# Patient Record
Sex: Female | Born: 1935 | Race: White | Hispanic: No | Smoking: Never smoker
Health system: Southern US, Community
[De-identification: ages and names within clinical notes are randomized; demographics above are authoritative.]

## PROBLEM LIST (undated history)

## (undated) ENCOUNTER — Ambulatory Visit: Admission: RE | Payer: Medicare Other | Source: Ambulatory Visit | Admitting: Gastroenterology

## (undated) DIAGNOSIS — J9601 Acute respiratory failure with hypoxia: Secondary | ICD-10-CM

## (undated) DIAGNOSIS — I2510 Atherosclerotic heart disease of native coronary artery without angina pectoris: Secondary | ICD-10-CM

## (undated) DIAGNOSIS — I10 Essential (primary) hypertension: Secondary | ICD-10-CM

## (undated) DIAGNOSIS — I499 Cardiac arrhythmia, unspecified: Secondary | ICD-10-CM

## (undated) DIAGNOSIS — I509 Heart failure, unspecified: Secondary | ICD-10-CM

## (undated) DIAGNOSIS — I639 Cerebral infarction, unspecified: Secondary | ICD-10-CM

## (undated) DIAGNOSIS — C50919 Malignant neoplasm of unspecified site of unspecified female breast: Secondary | ICD-10-CM

## (undated) HISTORY — DX: Cardiac arrhythmia, unspecified: I49.9

## (undated) HISTORY — PX: MASTECTOMY: SHX3

## (undated) HISTORY — DX: Atherosclerotic heart disease of native coronary artery without angina pectoris: I25.10

## (undated) HISTORY — DX: Heart failure, unspecified: I50.9

## (undated) HISTORY — PX: ABDOMINAL HYSTERECTOMY: SHX81

## (undated) HISTORY — PX: CHOLECYSTECTOMY: SHX55

## (undated) HISTORY — PX: APPENDECTOMY: SHX54

## (undated) HISTORY — PX: BREAST IMPLANT EXCHANGE: SHX6296

---

## 2004-06-30 ENCOUNTER — Other Ambulatory Visit: Payer: Self-pay

## 2004-06-30 ENCOUNTER — Inpatient Hospital Stay: Payer: Self-pay | Admitting: Surgery

## 2004-06-30 ENCOUNTER — Ambulatory Visit: Payer: Self-pay | Admitting: Internal Medicine

## 2004-06-30 IMAGING — US ABDOMEN ULTRASOUND
1 series · 17 of 25 positions shown · non-contrast
Comparison: none

REASON FOR EXAM: RUQ pain. Call report [[email or web address]], extension [IE]
COMMENTS:

[Series 1: abdomen ultrasound · 17 of 69 slices shown]
[im 1/69]
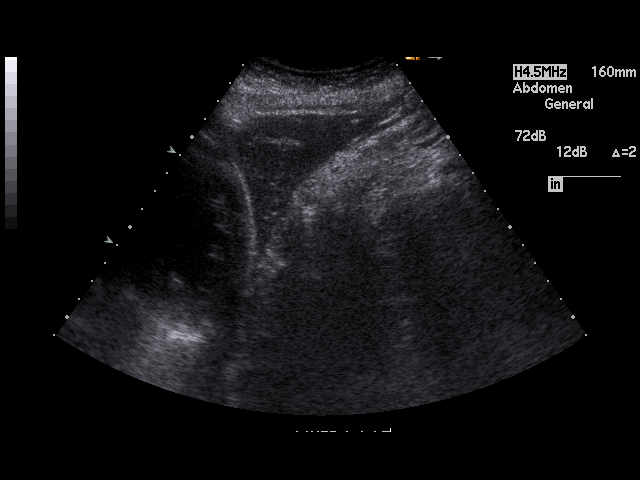
[im 6/69]
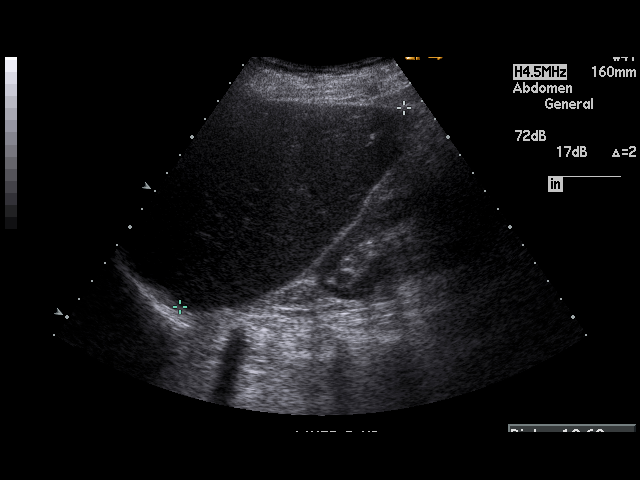
[im 9/69]
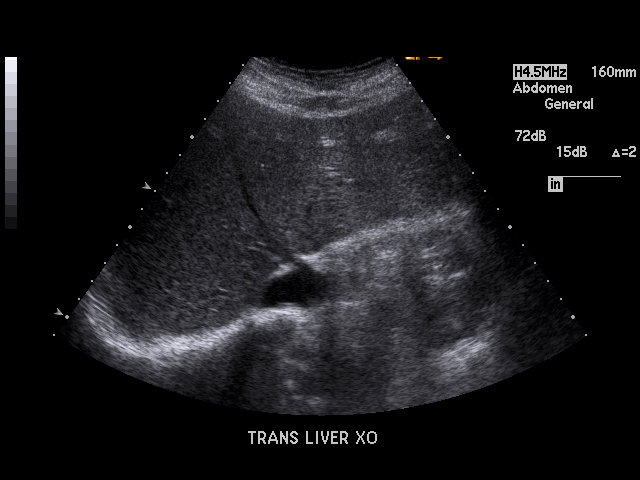
[im 15/69]
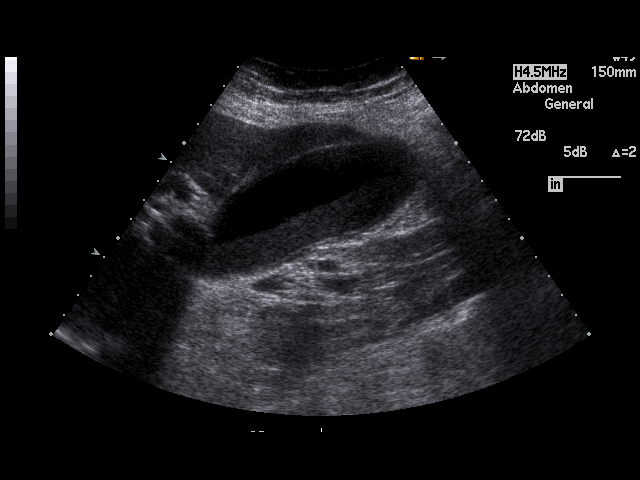
[im 18/69]
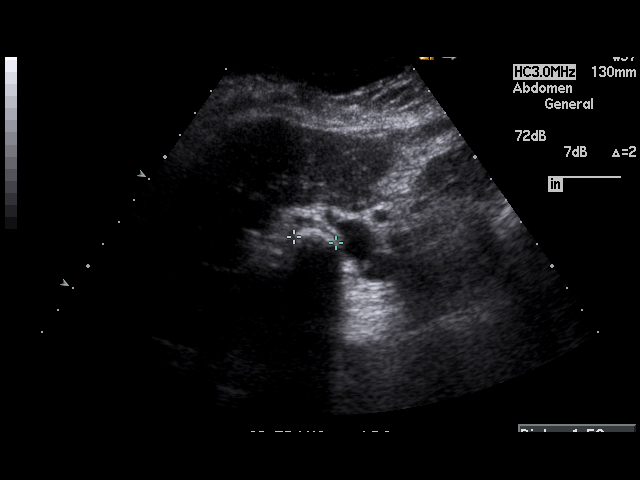
[im 23/69]
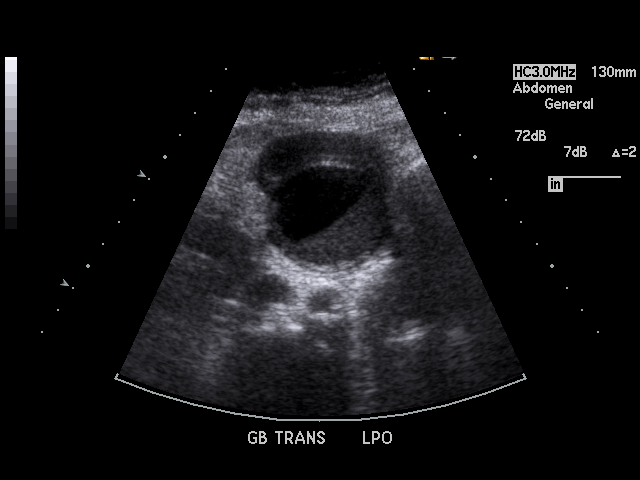
[im 26/69]
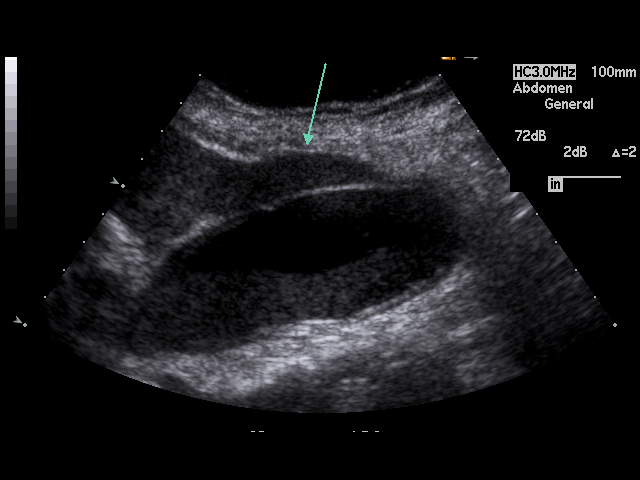
[im 32/69]
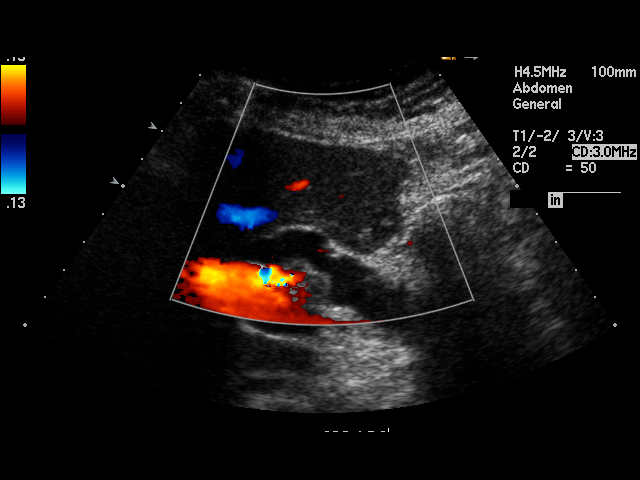
[im 35/69]
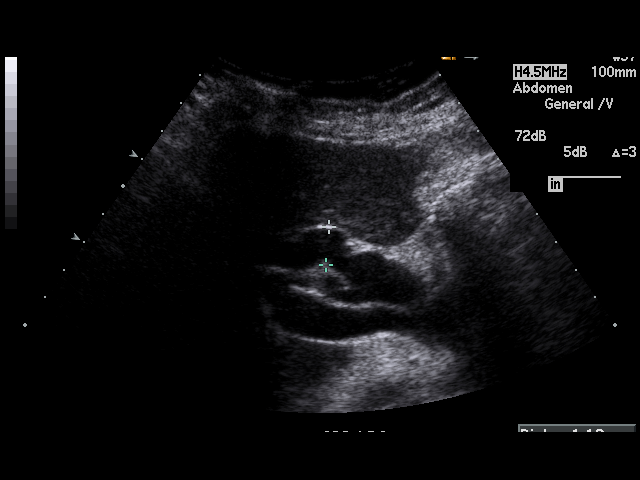
[im 37/69]
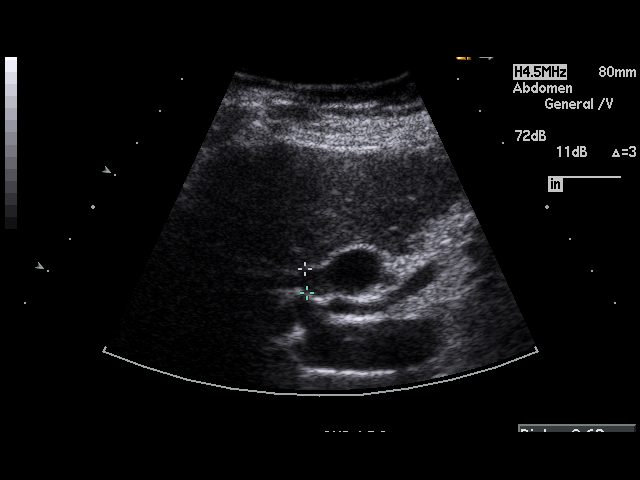
[im 43/69]
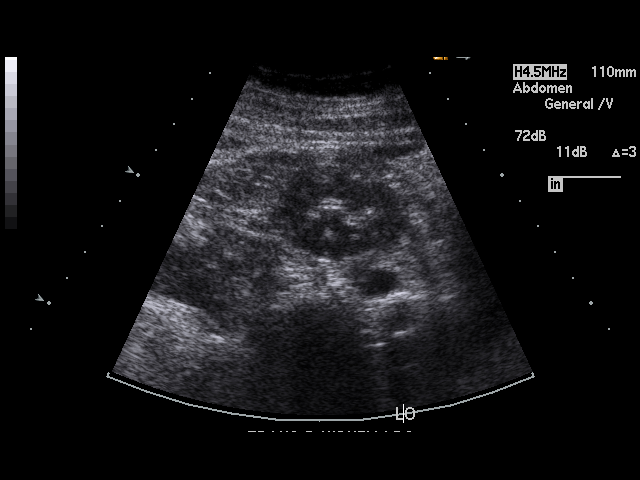
[im 46/69]
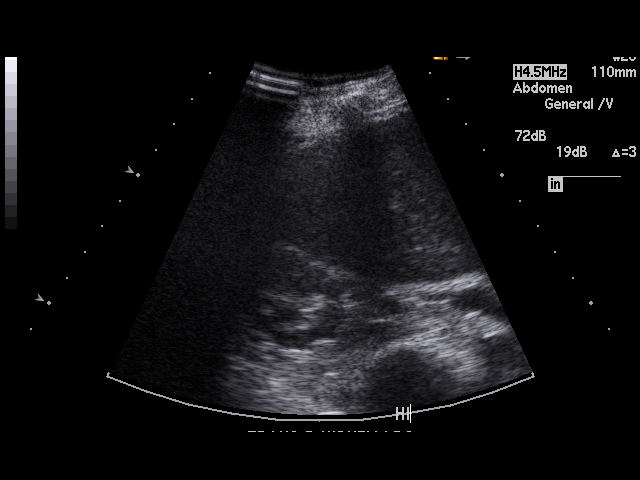
[im 52/69]
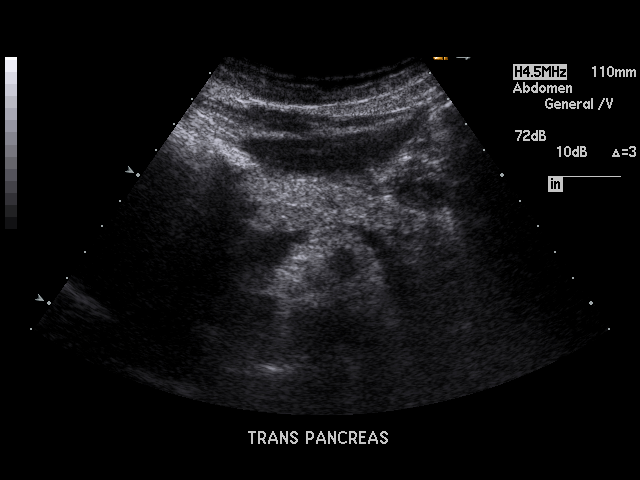
[im 54/69]
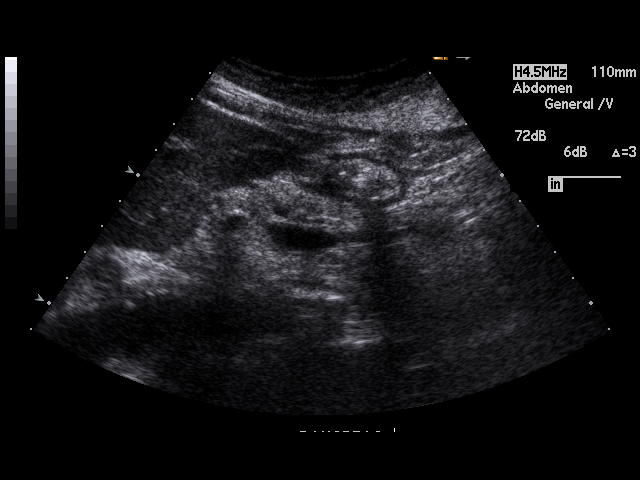
[im 60/69]
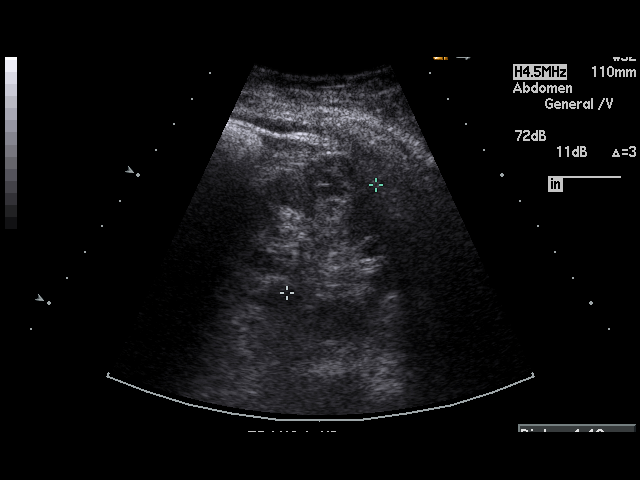
[im 63/69]
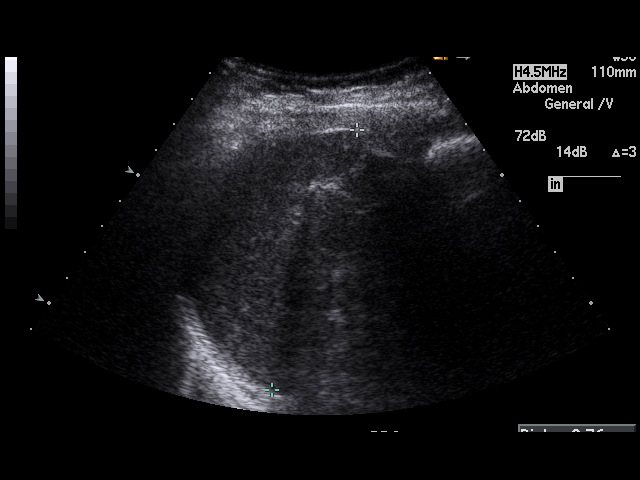
[im 69/69]
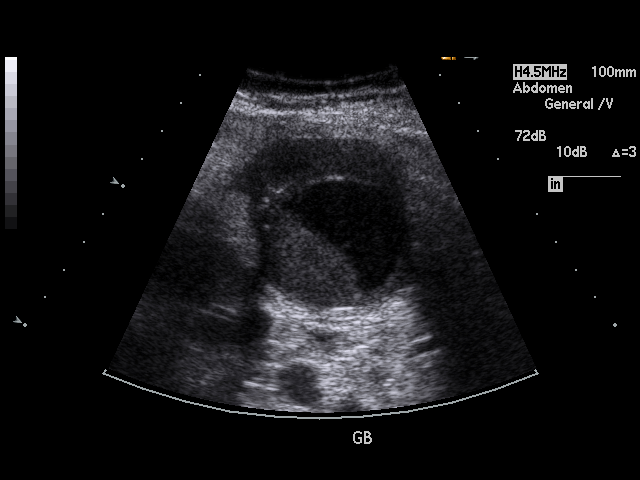

[17 of 25 positions shown; findings below may reference images not displayed]

PROCEDURE:     US  - US ABDOMEN GENERAL SURVEY  - [DATE]  [DATE]

RESULT:     The liver and spleen show no definite abnormalities.  The
pancreas is normal in appearance.  The gallbladder appears large.  There is
echogenic material in the gallbladder compatible with sludge.  Additionally
there is noted a stone in the neck of the gallbladder which does not move as
the patient changes position and is apparently lodged in the gallbladder
neck.  No definite thickening of the gallbladder wall is seen at this time.
The common bile duct is enlarged and is noted to measure 1.37 cm at maximum
diameter.  This degree of dilatation suggests common duct obstruction
although the obstructing agent is not identified on the study.  The kidneys
show no hydronephrosis.  There is no ascites.
IMPRESSION: Cholelithiasis. There is observed a stone which appears to be lodged in the
gallbladder neck.

There is dilatation of the common duct suspicious for partial common duct
obstruction.

No definite thickening of the gallbladder wall is identified at this time
but thickening is sometimes not well seen in the distended gallbladder.

The pancreas is normal in appearance.

## 2004-07-06 ENCOUNTER — Other Ambulatory Visit: Payer: Self-pay

## 2005-04-17 ENCOUNTER — Emergency Department: Payer: Self-pay | Admitting: Emergency Medicine

## 2005-04-17 IMAGING — CR RIGHT ANKLE - COMPLETE 3+ VIEW
1 series · 5 of 5 positions shown · non-contrast
Comparison: none

REASON FOR EXAM: Pain
COMMENTS:  LMP: N/A

PROCEDURE:     DXR - DXR ANKLE RIGHT COMPLETE  - [DATE] [DATE]
RESULT:          Views of the ankle reveal the joint mortise to be
preserved.  The talar dome is intact.  There is a plantar calcaneal spur.  I
see no acute fracture of the ankle nor of the visualized portions of the
foot.

[Series 1: view not recorded · 0.17mm/px · 5 of 5 slices shown]
[im 1/5]
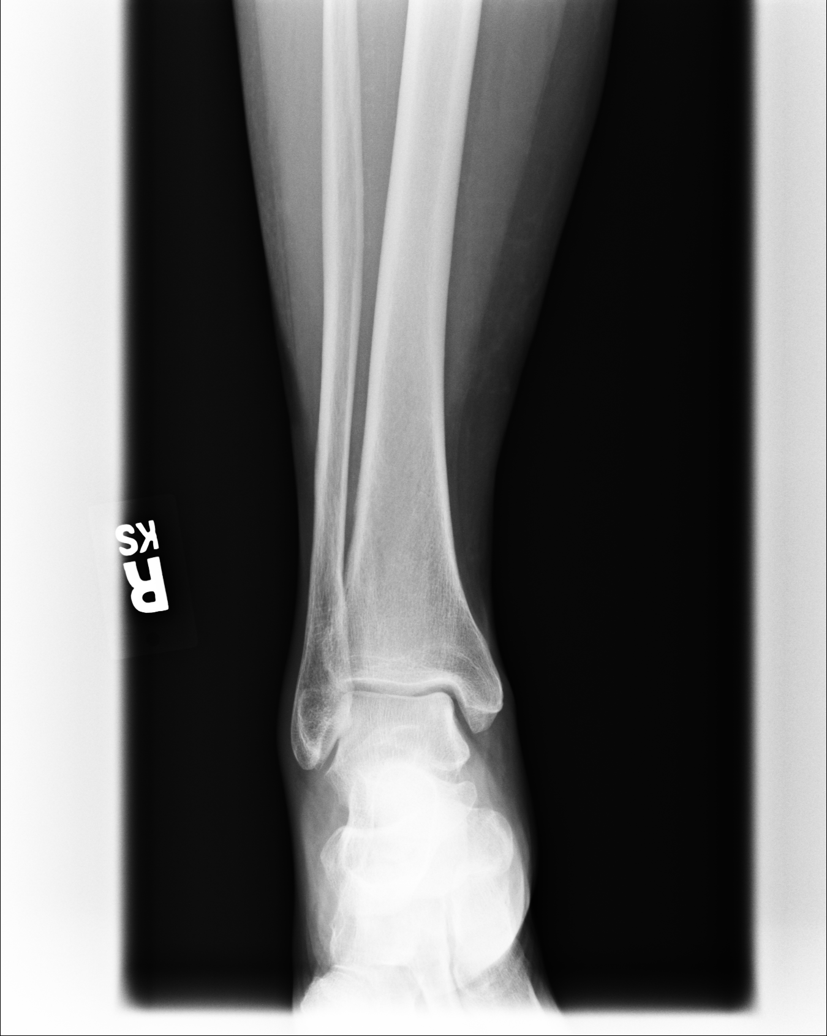
[im 2/5]
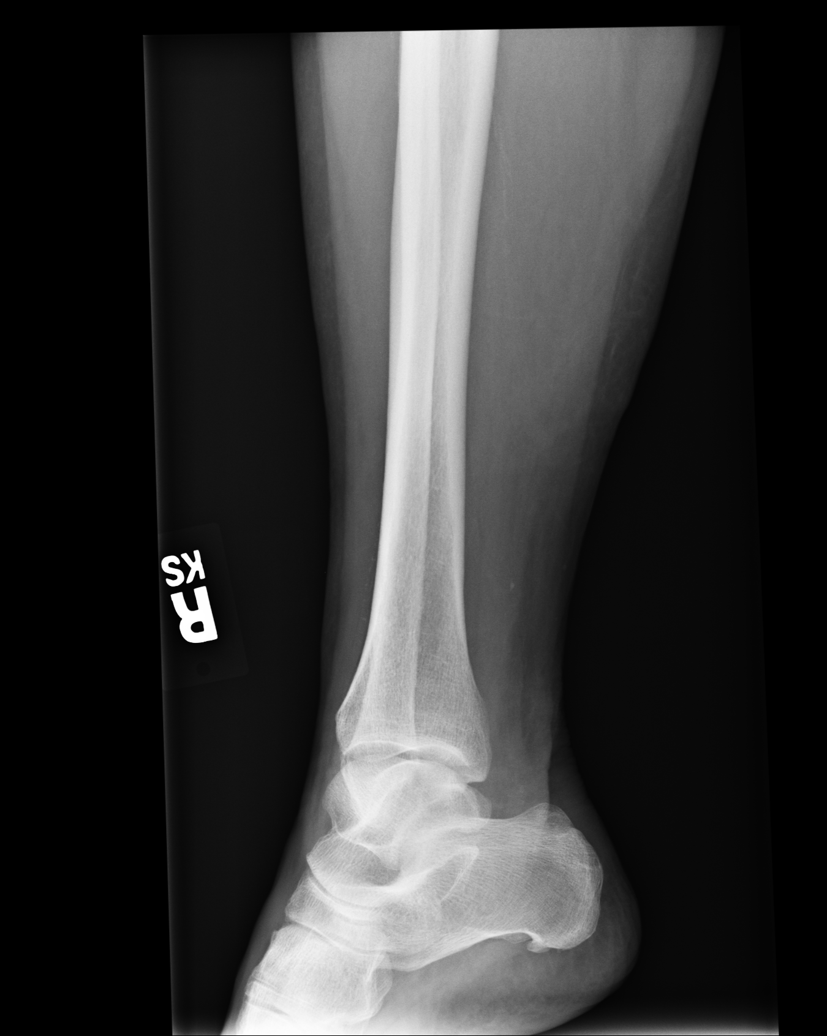
[im 3/5]
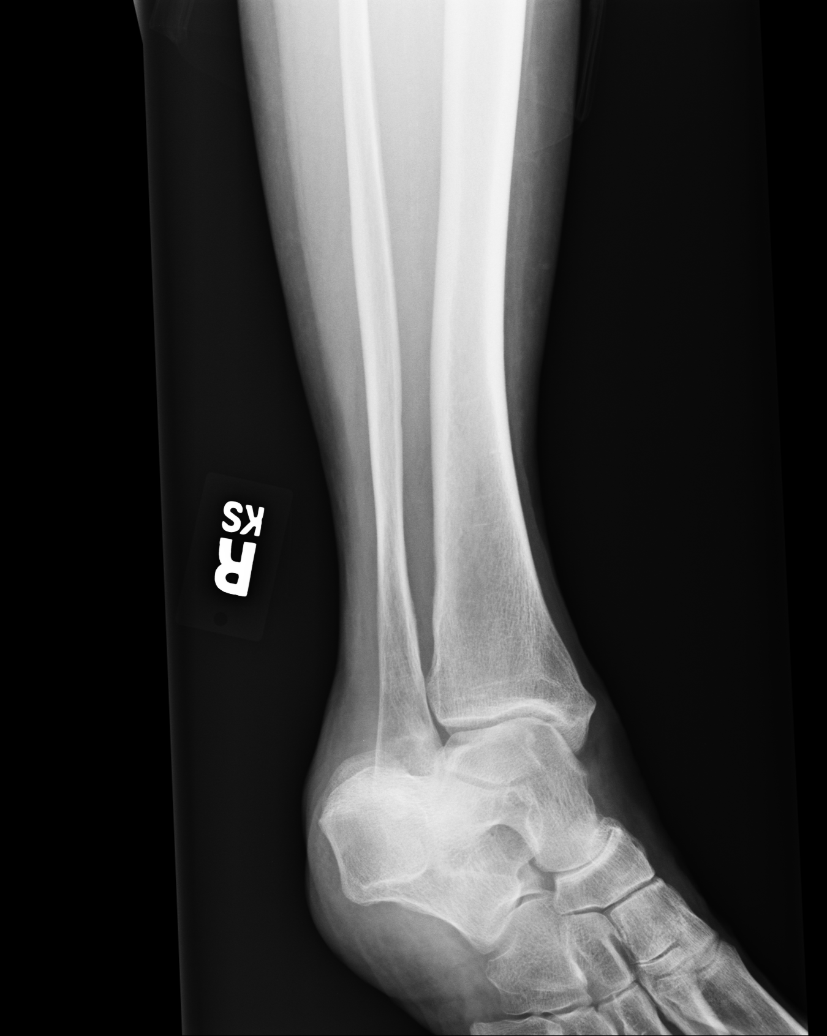
[im 4/5]
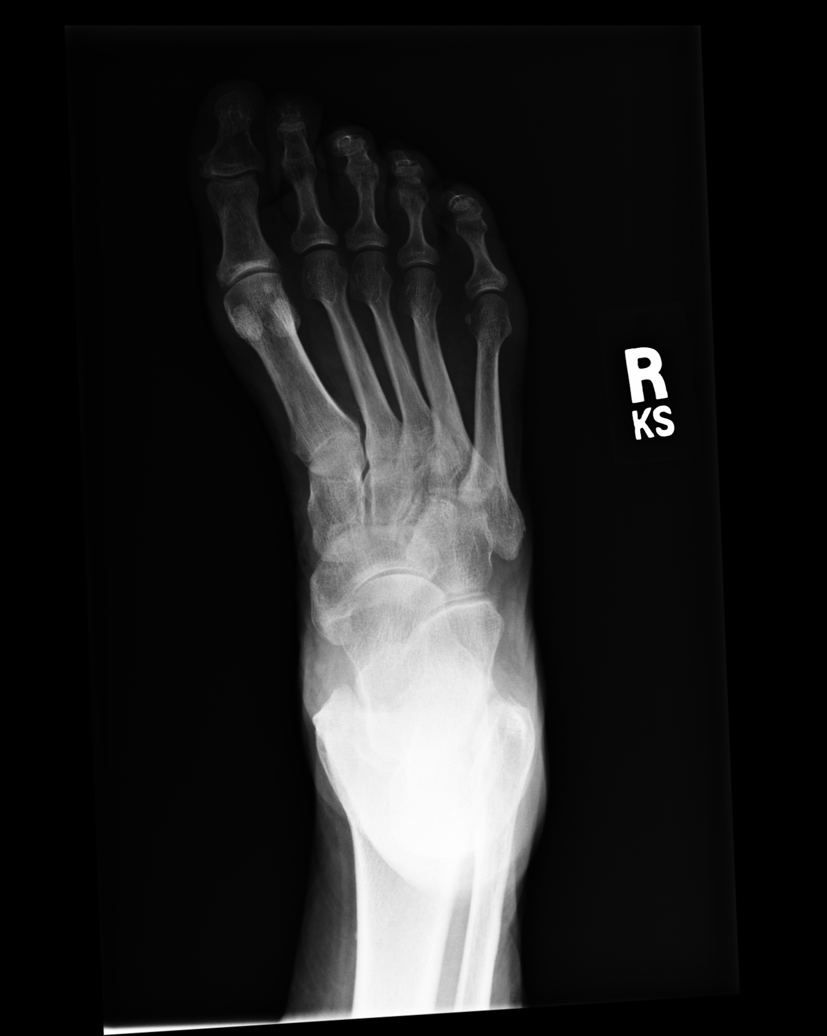
[im 5/5]
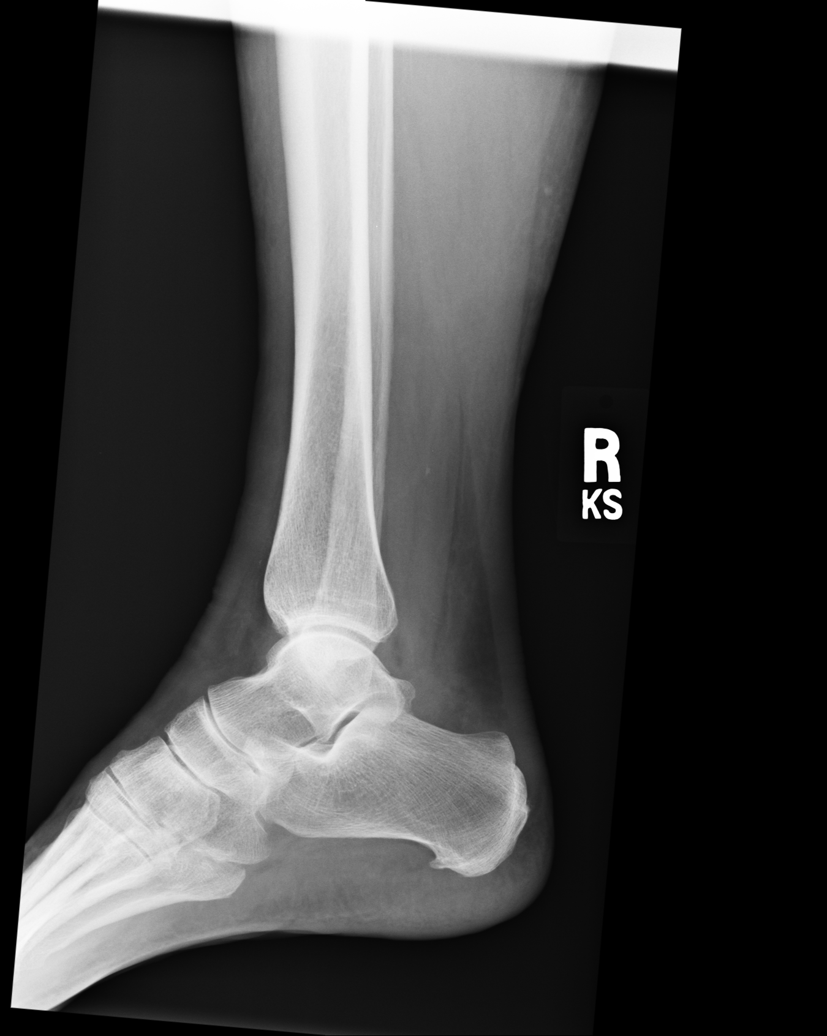

[5 of 5 positions shown; findings below may reference images not displayed]

IMPRESSION: I see no acute bony abnormality of the RIGHT ankle.

## 2007-03-08 ENCOUNTER — Ambulatory Visit: Payer: Self-pay | Admitting: Gastroenterology

## 2014-07-25 IMAGING — US US ABDOMEN LIMITED
1 series · 14 of 25 positions shown · non-contrast
Comparison: [DATE].

CLINICAL DATA: Elevated liver function tests.

EXAM:
US ABDOMEN LIMITED - RIGHT UPPER QUADRANT

[Series 1: us abdomen limited · 0.19mm/px · 14 of 48 slices shown]
[im 1/48]
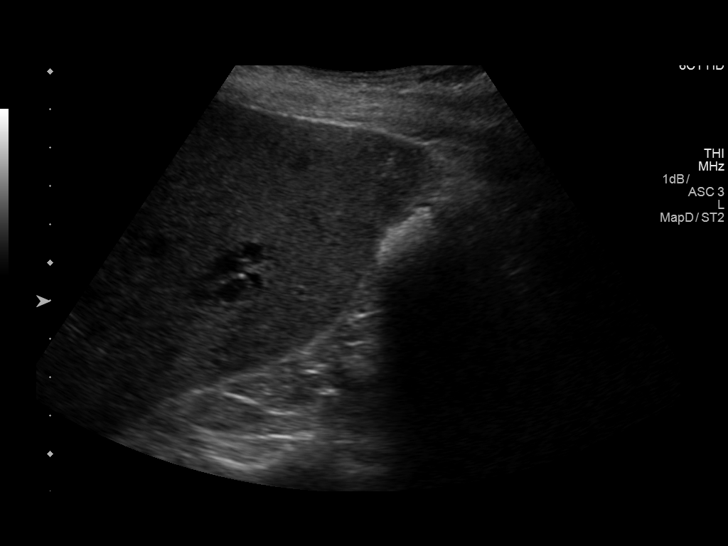
[im 4/48]
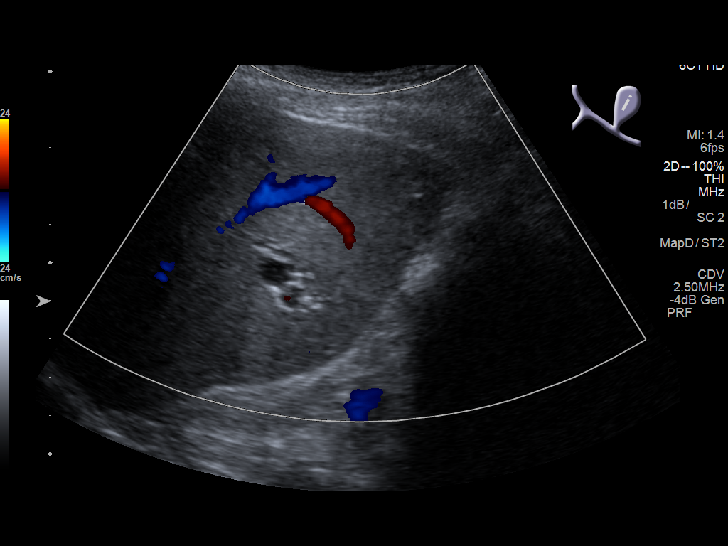
[im 8/48]
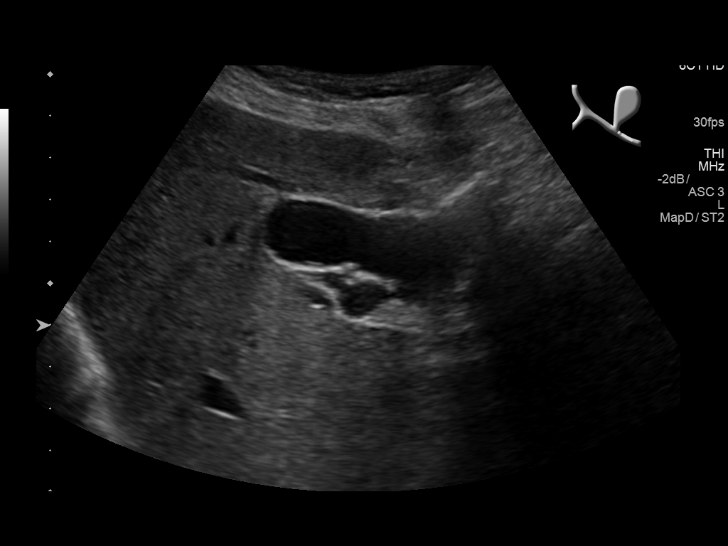
[im 12/48]
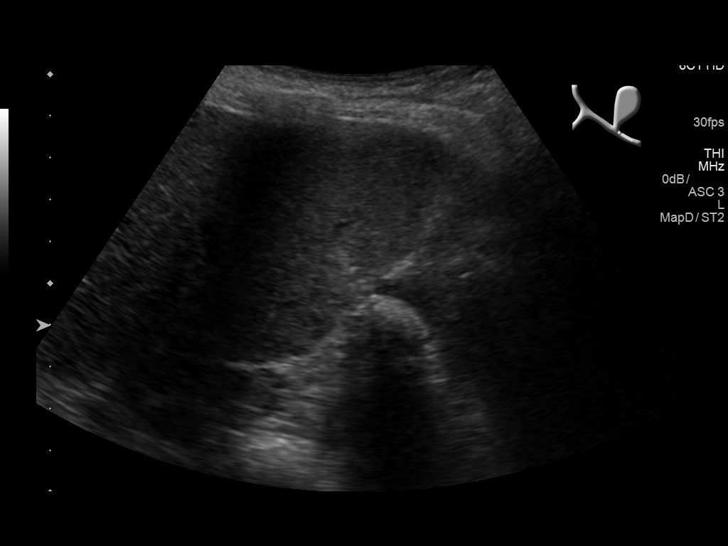
[im 16/48]
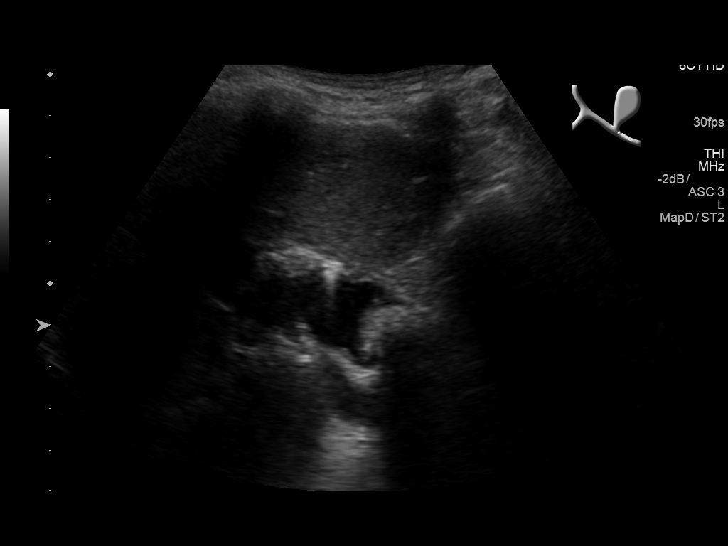
[im 18/48]
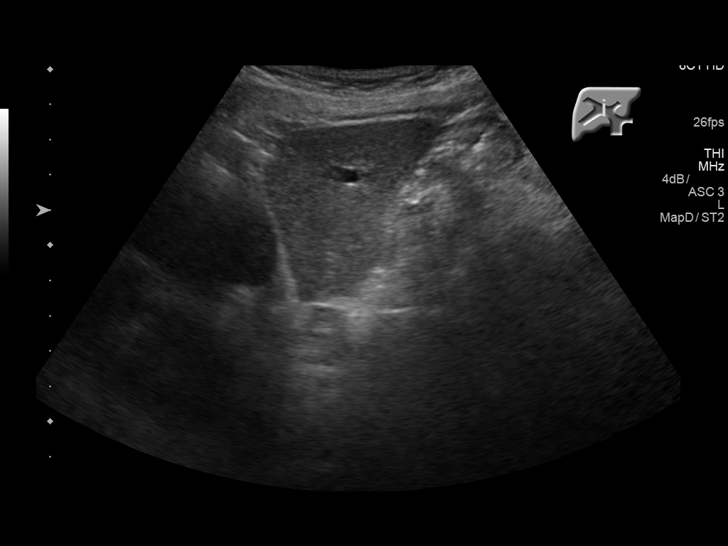
[im 22/48]
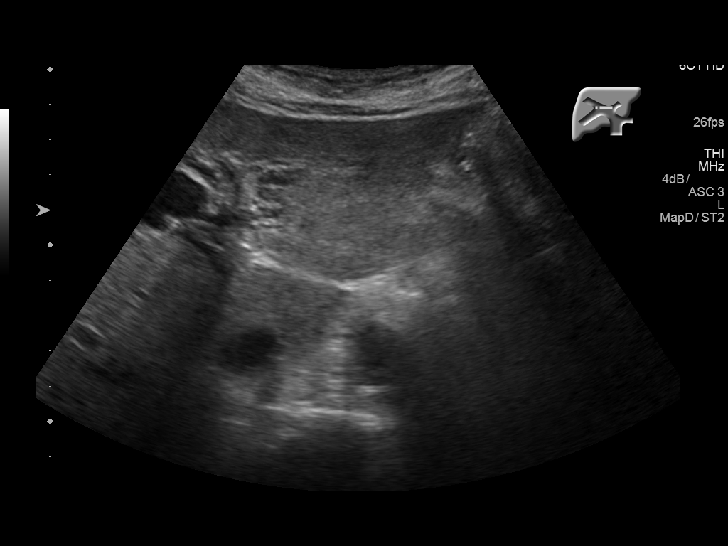
[im 26/48]
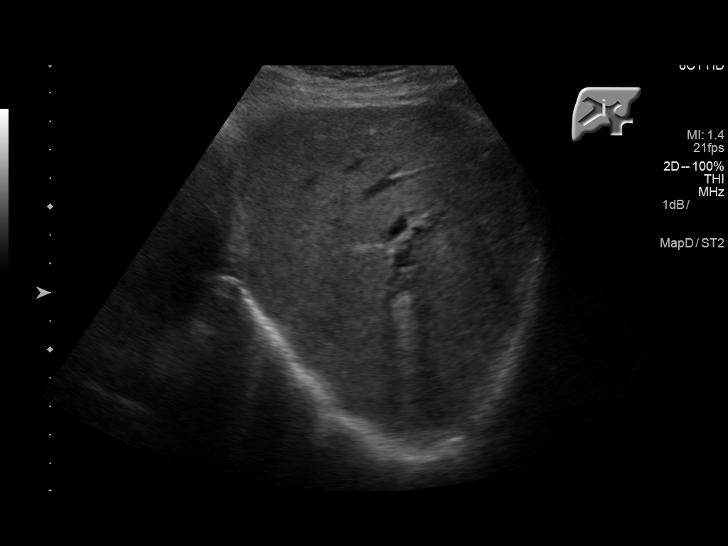
[im 30/48]
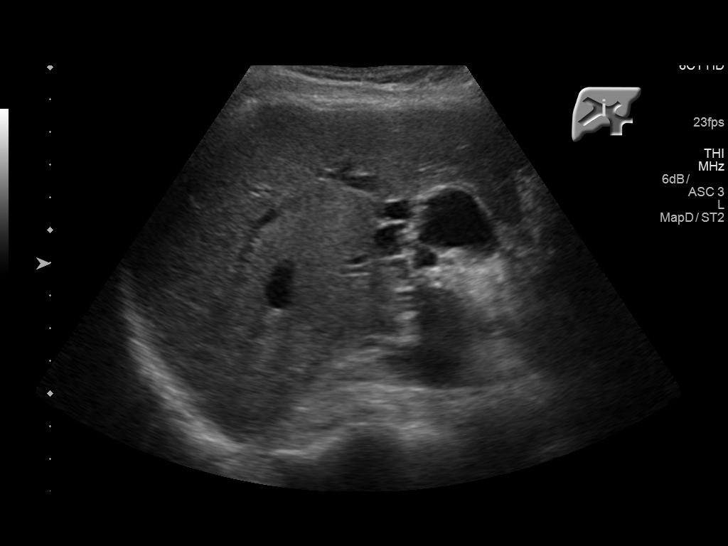
[im 32/48]
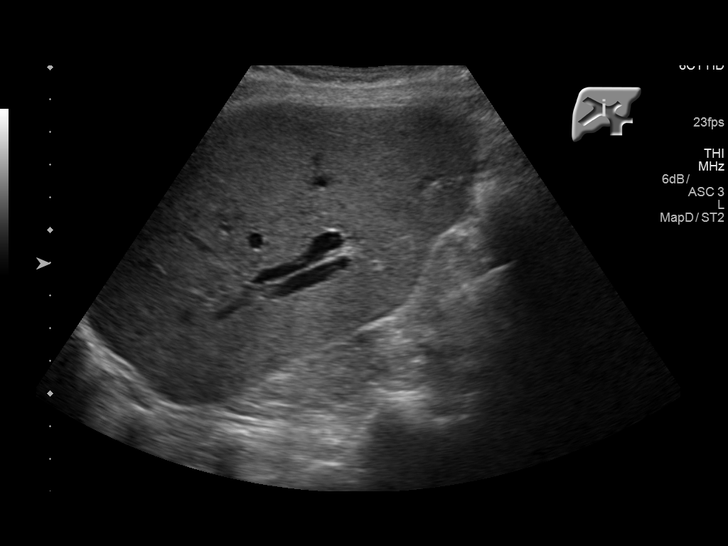
[im 36/48]
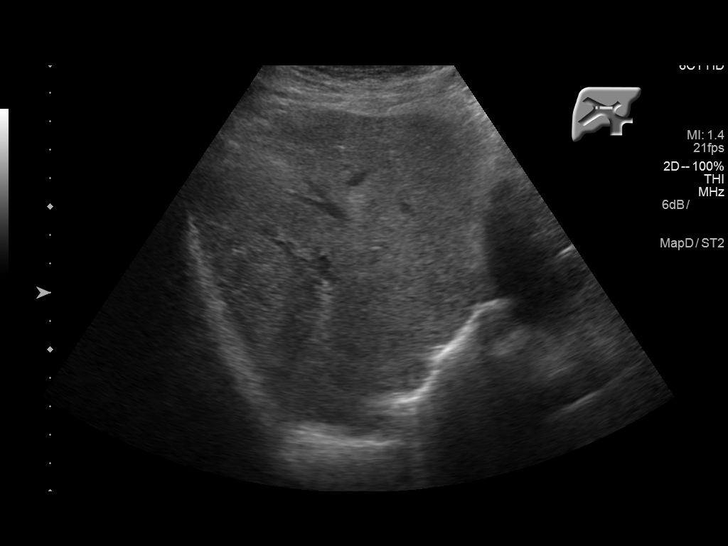
[im 40/48]
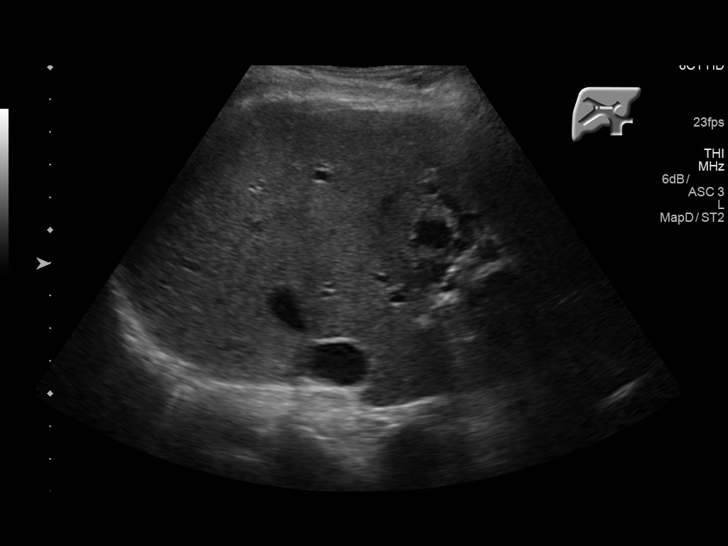
[im 44/48]
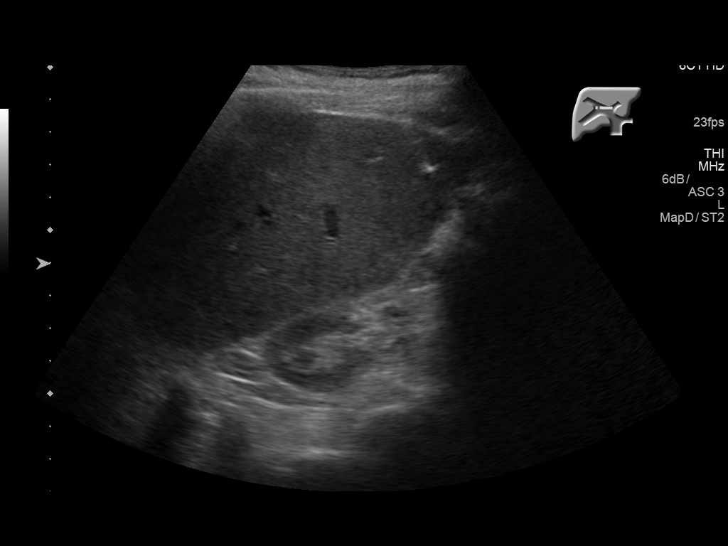
[im 48/48]
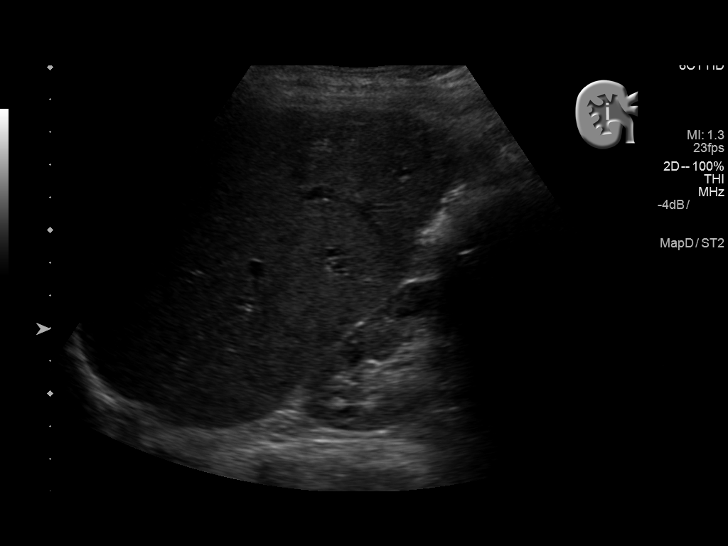

[14 of 25 positions shown; findings below may reference images not displayed]

FINDINGS: Gallbladder:

Status post cholecystectomy.

Common bile duct:

Diameter: Large probable gallstone measuring 1.7 cm is seen in the
common bile duct. Common bile duct is severely dilated at 2 cm.
Dilated intrahepatic ducts are noted as well.

Liver:

No focal lesion identified. Within normal limits in parenchymal
echogenicity.
IMPRESSION: Status post cholecystectomy. Severely dilated common bile duct
secondary to stone in distal common bile duct consistent with
choledocholithiasis. Further evaluation with CT scan is recommended.

## 2014-11-15 IMAGING — US US ABDOMEN LIMITED
1 series · 14 of 25 positions shown · non-contrast
Comparison: [DATE] abdominal sonogram.

CLINICAL DATA: [AGE] female with history of cholecystectomy
and dilated common bile duct with choledocholithiasis.

EXAM:
US ABDOMEN LIMITED - RIGHT UPPER QUADRANT

[Series 1: us abdomen limited · 0.19mm/px · 14 of 56 slices shown]
[im 1/56]
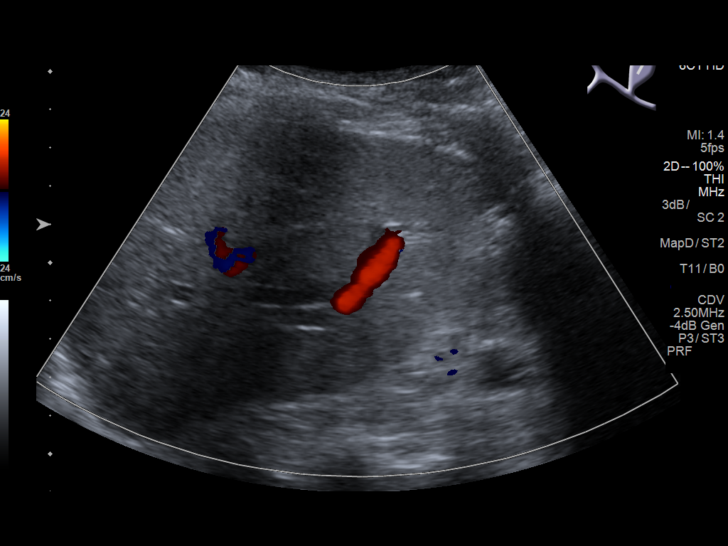
[im 5/56]
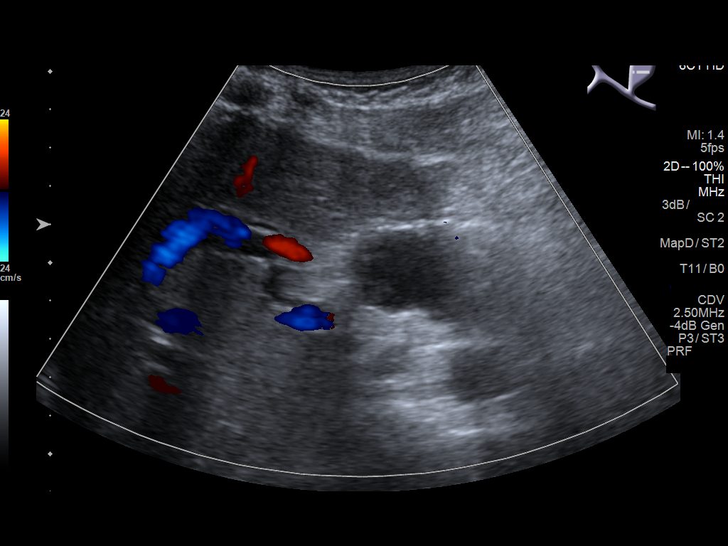
[im 10/56]
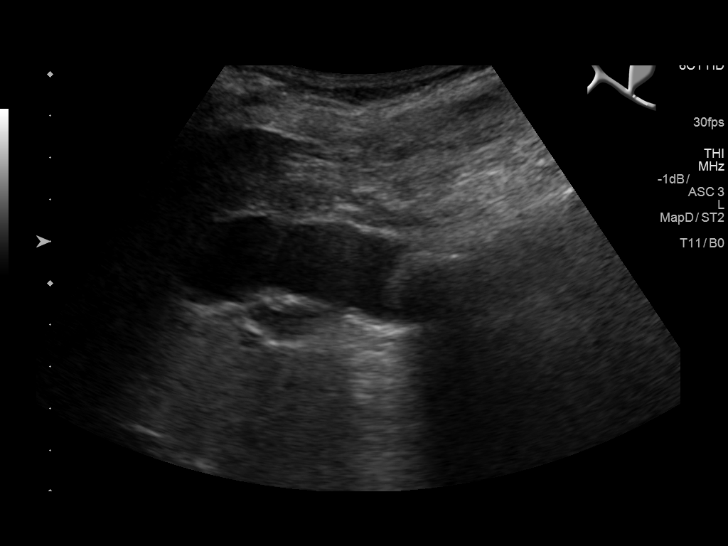
[im 14/56]
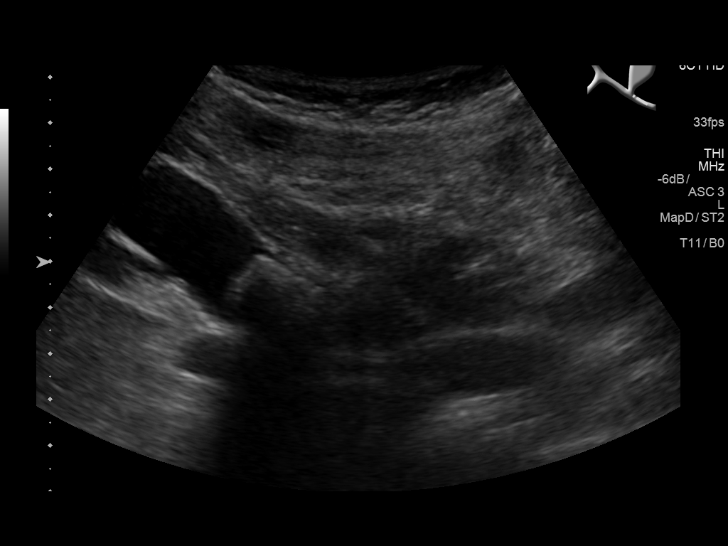
[im 19/56]
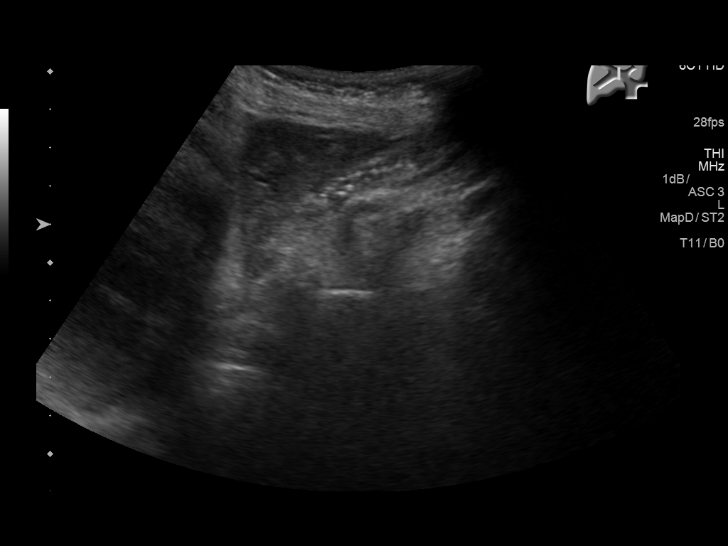
[im 21/56]
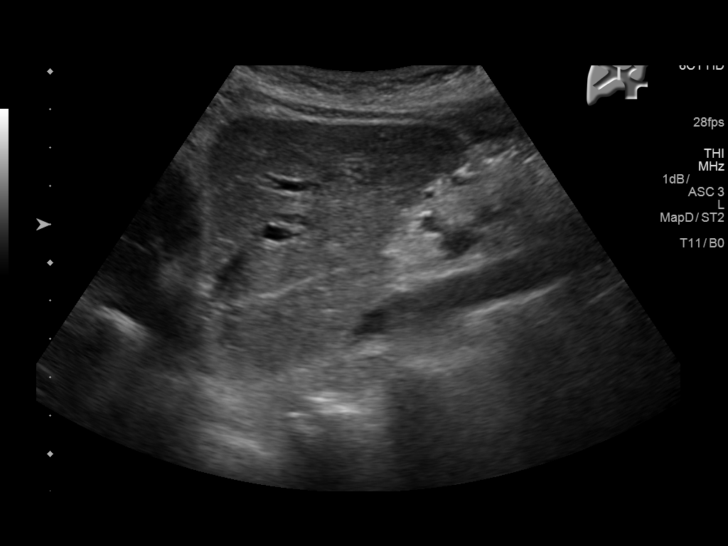
[im 26/56]
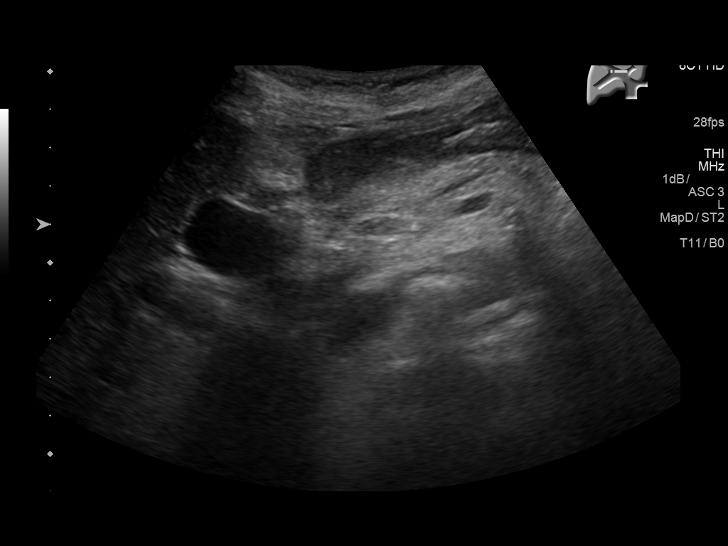
[im 30/56]
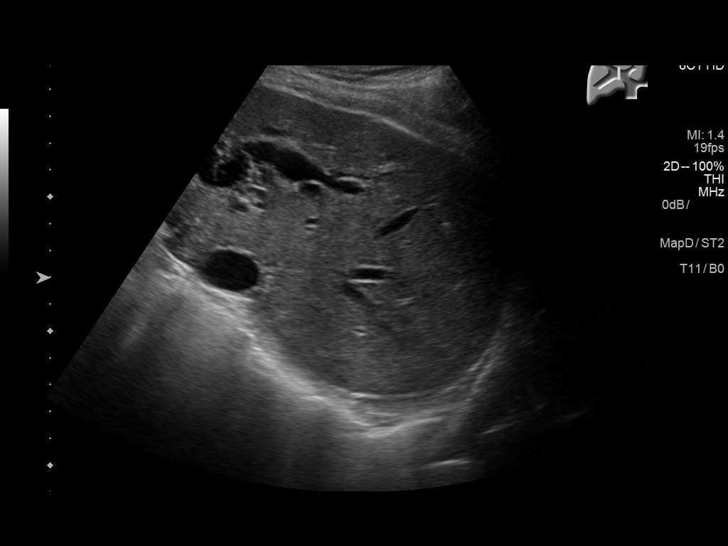
[im 35/56]
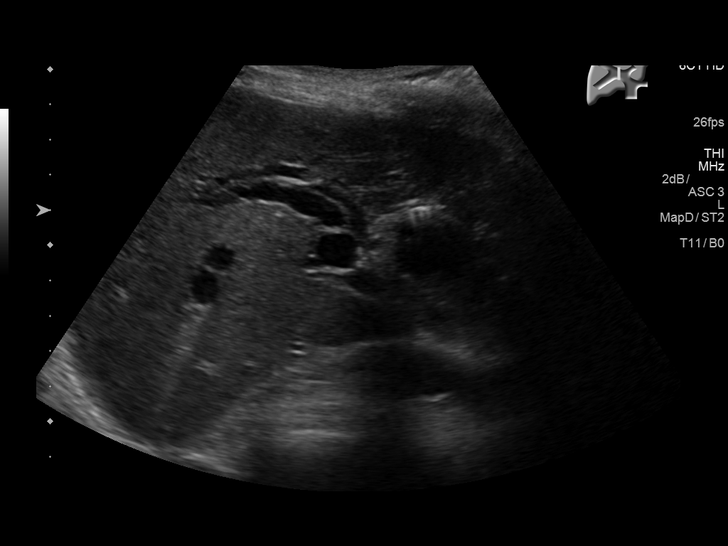
[im 37/56]
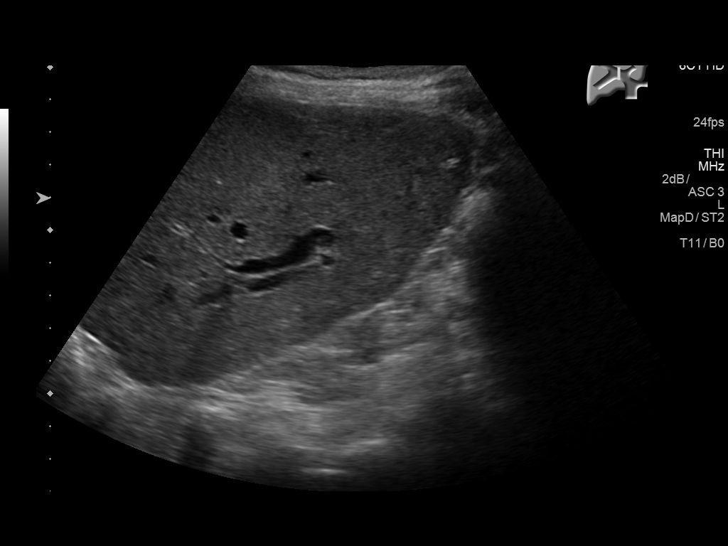
[im 42/56]
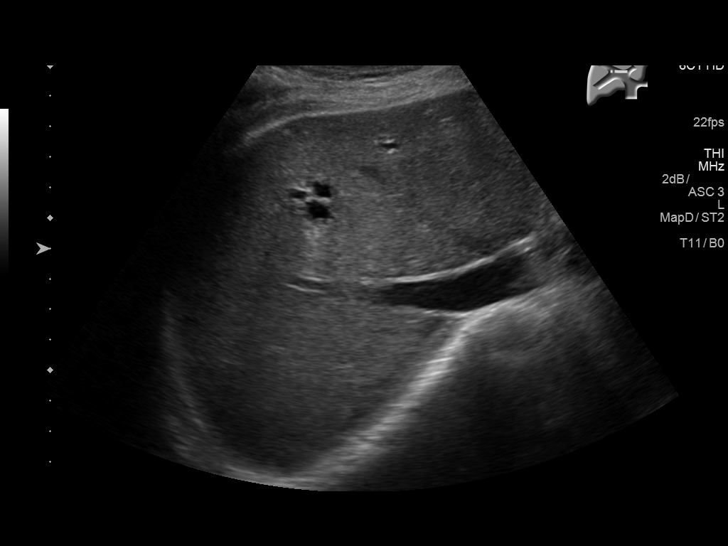
[im 46/56]
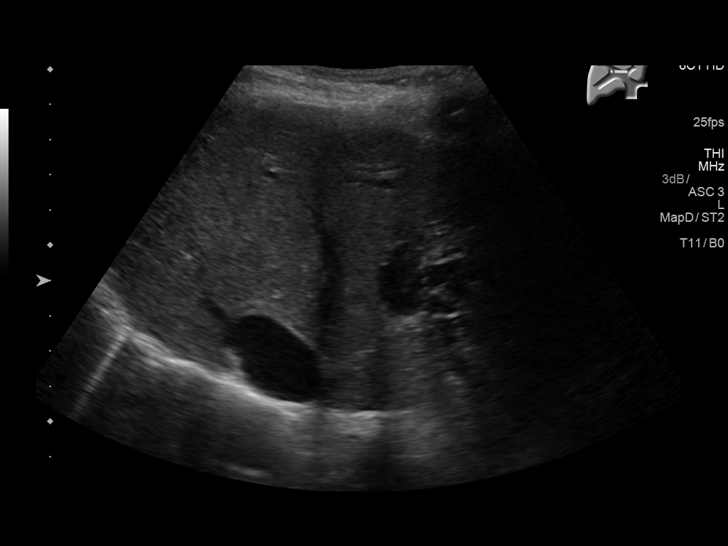
[im 51/56]
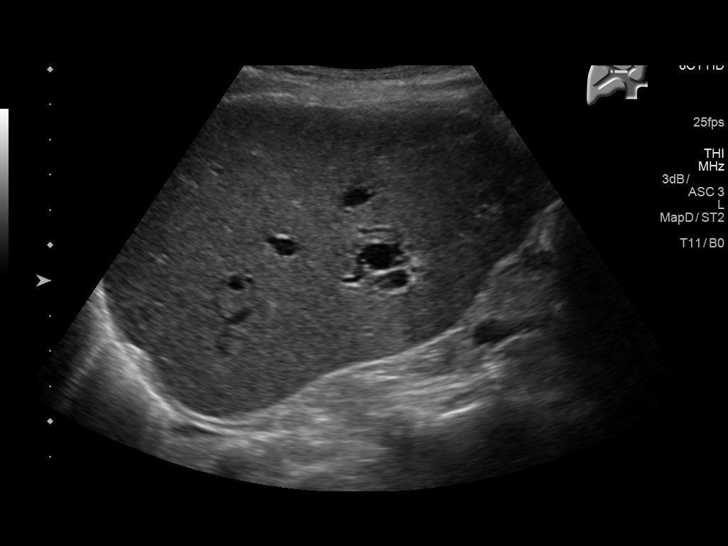
[im 56/56]
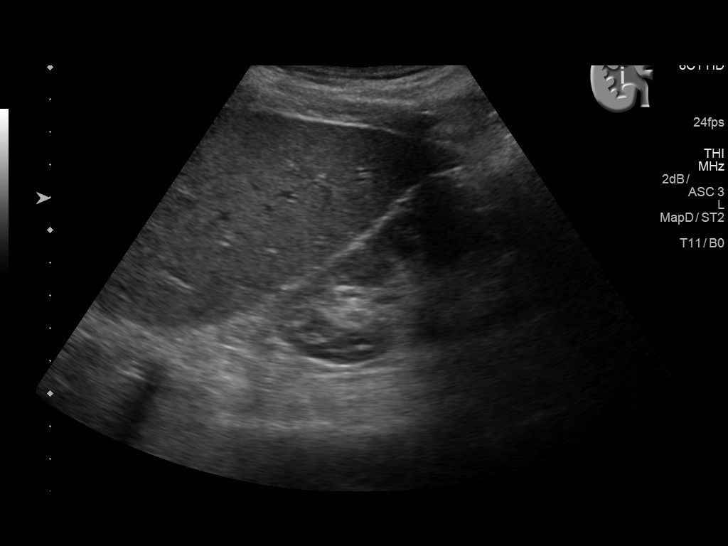

[14 of 25 positions shown; findings below may reference images not displayed]

FINDINGS: Gallbladder:

Surgically absent.

Common bile duct:

Diameter: 20 mm, unchanged. Re- demonstrated is a calcified
shadowing stone in the lower third of the common bile duct measuring
2.2 cm, not appreciably changed.

Liver:

Diffuse intrahepatic biliary ductal dilatation appears unchanged.
Liver parenchymal echogenicity and echotexture are normal. No liver
mass. No perihepatic ascites.
IMPRESSION: 1. Cholecystectomy.
2. Stable intrahepatic biliary ductal dilatation and marked common
bile duct dilatation (CBD diameter 20 mm) with 2.2 cm choledocholith
in the lower third of the common bile duct.
3. Otherwise normal liver.

## 2015-04-21 ENCOUNTER — Other Ambulatory Visit: Payer: Self-pay | Admitting: Infectious Diseases

## 2015-04-21 DIAGNOSIS — R945 Abnormal results of liver function studies: Principal | ICD-10-CM

## 2015-04-21 DIAGNOSIS — R7989 Other specified abnormal findings of blood chemistry: Secondary | ICD-10-CM

## 2015-04-27 ENCOUNTER — Ambulatory Visit
Admission: RE | Admit: 2015-04-27 | Discharge: 2015-04-27 | Disposition: A | Discharge status: Home / Self Care | Payer: Medicare Other | Source: Ambulatory Visit | Attending: Infectious Diseases | Admitting: Infectious Diseases

## 2015-04-27 DIAGNOSIS — R945 Abnormal results of liver function studies: Secondary | ICD-10-CM | POA: Diagnosis present

## 2015-04-27 DIAGNOSIS — I10 Essential (primary) hypertension: Secondary | ICD-10-CM | POA: Diagnosis present

## 2015-04-27 DIAGNOSIS — Z9049 Acquired absence of other specified parts of digestive tract: Secondary | ICD-10-CM | POA: Diagnosis not present

## 2015-04-27 DIAGNOSIS — K8050 Calculus of bile duct without cholangitis or cholecystitis without obstruction: Secondary | ICD-10-CM | POA: Insufficient documentation

## 2015-04-27 DIAGNOSIS — R7989 Other specified abnormal findings of blood chemistry: Secondary | ICD-10-CM

## 2015-08-14 ENCOUNTER — Other Ambulatory Visit: Payer: Self-pay | Admitting: Infectious Diseases

## 2015-08-14 DIAGNOSIS — K8051 Calculus of bile duct without cholangitis or cholecystitis with obstruction: Secondary | ICD-10-CM

## 2015-08-14 DIAGNOSIS — K921 Melena: Secondary | ICD-10-CM

## 2015-08-17 ENCOUNTER — Observation Stay
Admission: EM | Admit: 2015-08-17 | Discharge: 2015-08-21 | Disposition: A | Discharge status: Home / Self Care | Payer: Medicare Other | Attending: Internal Medicine | Admitting: Internal Medicine

## 2015-08-17 DIAGNOSIS — E876 Hypokalemia: Secondary | ICD-10-CM | POA: Insufficient documentation

## 2015-08-17 DIAGNOSIS — Z79899 Other long term (current) drug therapy: Secondary | ICD-10-CM | POA: Insufficient documentation

## 2015-08-17 DIAGNOSIS — I10 Essential (primary) hypertension: Secondary | ICD-10-CM | POA: Diagnosis not present

## 2015-08-17 DIAGNOSIS — Z5309 Procedure and treatment not carried out because of other contraindication: Secondary | ICD-10-CM | POA: Diagnosis not present

## 2015-08-17 DIAGNOSIS — I2510 Atherosclerotic heart disease of native coronary artery without angina pectoris: Secondary | ICD-10-CM | POA: Diagnosis not present

## 2015-08-17 DIAGNOSIS — K8050 Calculus of bile duct without cholangitis or cholecystitis without obstruction: Secondary | ICD-10-CM | POA: Insufficient documentation

## 2015-08-17 DIAGNOSIS — Z8249 Family history of ischemic heart disease and other diseases of the circulatory system: Secondary | ICD-10-CM | POA: Insufficient documentation

## 2015-08-17 DIAGNOSIS — Z853 Personal history of malignant neoplasm of breast: Secondary | ICD-10-CM | POA: Diagnosis not present

## 2015-08-17 DIAGNOSIS — Z90710 Acquired absence of both cervix and uterus: Secondary | ICD-10-CM | POA: Diagnosis not present

## 2015-08-17 DIAGNOSIS — Z951 Presence of aortocoronary bypass graft: Secondary | ICD-10-CM | POA: Insufficient documentation

## 2015-08-17 DIAGNOSIS — D649 Anemia, unspecified: Secondary | ICD-10-CM | POA: Insufficient documentation

## 2015-08-17 DIAGNOSIS — K922 Gastrointestinal hemorrhage, unspecified: Secondary | ICD-10-CM | POA: Diagnosis present

## 2015-08-17 DIAGNOSIS — D72829 Elevated white blood cell count, unspecified: Secondary | ICD-10-CM | POA: Insufficient documentation

## 2015-08-17 DIAGNOSIS — K259 Gastric ulcer, unspecified as acute or chronic, without hemorrhage or perforation: Secondary | ICD-10-CM | POA: Insufficient documentation

## 2015-08-17 DIAGNOSIS — Z9049 Acquired absence of other specified parts of digestive tract: Secondary | ICD-10-CM | POA: Diagnosis not present

## 2015-08-17 DIAGNOSIS — Z7982 Long term (current) use of aspirin: Secondary | ICD-10-CM | POA: Diagnosis not present

## 2015-08-17 DIAGNOSIS — K2970 Gastritis, unspecified, without bleeding: Secondary | ICD-10-CM | POA: Insufficient documentation

## 2015-08-17 DIAGNOSIS — K8051 Calculus of bile duct without cholangitis or cholecystitis with obstruction: Secondary | ICD-10-CM

## 2015-08-17 DIAGNOSIS — K921 Melena: Secondary | ICD-10-CM | POA: Diagnosis not present

## 2015-08-17 HISTORY — DX: Essential (primary) hypertension: I10

## 2015-08-17 HISTORY — DX: Malignant neoplasm of unspecified site of unspecified female breast: C50.919

## 2015-08-17 LAB — COMPREHENSIVE METABOLIC PANEL
ALT: 28 U/L (ref 14–54)
AST: 22 U/L (ref 15–41)
Albumin: 3.5 g/dL (ref 3.5–5.0)
Alkaline Phosphatase: 261 U/L — ABNORMAL HIGH (ref 38–126)
Anion gap: 7 (ref 5–15)
BUN: 36 mg/dL — AB (ref 6–20)
CHLORIDE: 103 mmol/L (ref 101–111)
CO2: 28 mmol/L (ref 22–32)
CREATININE: 1.06 mg/dL — AB (ref 0.44–1.00)
Calcium: 9.8 mg/dL (ref 8.9–10.3)
GFR calc Af Amer: 52 mL/min — ABNORMAL LOW (ref >60)
GFR calc non Af Amer: 45 mL/min — ABNORMAL LOW (ref >60)
GLUCOSE: 104 mg/dL — AB (ref 65–99)
Potassium: 4.1 mmol/L (ref 3.5–5.1)
SODIUM: 138 mmol/L (ref 135–145)
Total Bilirubin: 0.2 mg/dL — ABNORMAL LOW (ref 0.3–1.2)
Total Protein: 6.8 g/dL (ref 6.5–8.1)

## 2015-08-17 LAB — CBC
HCT: 30.8 % — ABNORMAL LOW (ref 35.0–47.0)
Hemoglobin: 10.5 g/dL — ABNORMAL LOW (ref 12.0–16.0)
MCH: 31.4 pg (ref 26.0–34.0)
MCHC: 34.1 g/dL (ref 32.0–36.0)
MCV: 92.1 fL (ref 80.0–100.0)
PLATELETS: 375 10*3/uL (ref 150–440)
RBC: 3.35 MIL/uL — ABNORMAL LOW (ref 3.80–5.20)
RDW: 15.3 % — AB (ref 11.5–14.5)
WBC: 8.7 10*3/uL (ref 3.6–11.0)

## 2015-08-17 LAB — TYPE AND SCREEN
ABO/RH(D): O POS
Antibody Screen: NEGATIVE

## 2015-08-17 NOTE — ED Notes (Signed)
Pt was sent to the ED from [facility] with c/o low Hgb taken on Friday.. States she has been getting routine labs for the past several months since dx with bile stone.Marland Kitchen

## 2015-08-17 NOTE — ED Provider Notes (Signed)
[facility] Emergency Department Provider Note    ____________________________________________  Time seen: ~2110  I have reviewed the triage vital signs and the nursing notes.   HISTORY  Chief Complaint Abnormal Lab   History limited by: Not Limited   HPI Sheryl Suarez is a 82 y.o. female who presents to the emergency department today because of concerns for low hemoglobin and black and tarry stools. Patient states she has been having black tarry stools for the past few days. On routine blood check it was noted she had a low hemoglobin. Patient does state that she has felt fatigued recently. She has not had any abdominal pain. No fevers nausea or vomiting. Denies any history of GI bleeds. Denies history of colonoscopy.   Past Medical History  Diagnosis Date  . Cancer ([place])   . Hypertension     There are no active problems to display for this patient.   Past Surgical History  Procedure Laterality Date  . Appendectomy    . Cholecystectomy    . Abdominal hysterectomy    . Mastectomy Bilateral   . Breast implant exchange      No current outpatient prescriptions on file.  Allergies Review of patient's allergies indicates no known allergies.  No family history on file.  Social History Social History  Substance Use Topics  . Smoking status: Never Smoker   . Smokeless tobacco: None  . Alcohol Use: No    Review of Systems  Constitutional: Negative for fever. Cardiovascular: Negative for chest pain. Respiratory: Negative for shortness of breath. Gastrointestinal: Negative for abdominal pain, vomiting and diarrhea.Positive for black tarry stools Neurological: Negative for headaches, focal weakness or numbness.   10-point ROS otherwise negative.  ____________________________________________   PHYSICAL EXAM:  VITAL SIGNS: ED Triage Vitals  Enc Vitals Group     BP 08/17/15 1804 163/70 mmHg     Pulse Rate 08/17/15 1804 89     Resp  08/17/15 1804 18     Temp 08/17/15 1804 98.3 F (36.8 C)     Temp Source 08/17/15 1804 Oral     SpO2 08/17/15 1804 98 %     Weight 08/17/15 1804 106 lb (48.081 kg)     Height 08/17/15 1804 5\' 2"  (1.575 m)   Constitutional: Alert and oriented. Well appearing and in no distress. Eyes: Conjunctivae are normal. PERRL. Normal extraocular movements. ENT   Head: Normocephalic and atraumatic.   Nose: No congestion/rhinnorhea.   Mouth/Throat: Mucous membranes are moist.   Neck: No stridor. Hematological/Lymphatic/Immunilogical: No cervical lymphadenopathy. Cardiovascular: Normal rate, regular rhythm.  No murmurs, rubs, or gallops. Respiratory: Normal respiratory effort without tachypnea nor retractions. Breath sounds are clear and equal bilaterally. No wheezes/rales/rhonchi. Gastrointestinal: Soft and nontender. No distention. There is no CVA tenderness. Rectal: GUIAC positive. Brown stool on glove. Musculoskeletal: Normal range of motion in all extremities. No joint effusions.  No lower extremity tenderness nor edema. Neurologic:  Normal speech and language. No gross focal neurologic deficits are appreciated.  Skin:  Skin is warm, dry and intact. No rash noted. Psychiatric: Mood and affect are normal. Speech and behavior are normal. Patient exhibits appropriate insight and judgment.  ____________________________________________    LABS (pertinent positives/negatives)  Labs Reviewed  COMPREHENSIVE METABOLIC PANEL - Abnormal; Notable for the following:    Glucose, Bld 104 (*)    BUN 36 (*)    Creatinine, Ser 1.06 (*)    Alkaline Phosphatase 261 (*)    Total Bilirubin 0.2 (*)  GFR calc non Af Amer 45 (*)    GFR calc Af Amer 52 (*)    All other components within normal limits  CBC - Abnormal; Notable for the following:    RBC 3.35 (*)    Hemoglobin 10.5 (*)    HCT 30.8 (*)    RDW 15.3 (*)    All other components within normal limits  TYPE AND SCREEN      ____________________________________________   EKG  None  ____________________________________________    RADIOLOGY  None  ____________________________________________   PROCEDURES  Procedure(s) performed: None  Critical Care performed: No  ____________________________________________   INITIAL IMPRESSION / ASSESSMENT AND PLAN / ED COURSE  Pertinent labs & imaging results that were available during my care of the patient were reviewed by me and considered in my medical decision making (see chart for details).  Patient presented to the emergency department today because of concerns for anemia in the setting of black tarry stools. On exam patient was guaiac positive. Brown stool on the glove. Patient was anemic emergency department. Will plan on admission for further management and workup of GI bleed.  ____________________________________________   FINAL CLINICAL IMPRESSION(S) / ED DIAGNOSES  Final diagnoses:  Melena  Anemia, unspecified anemia type  Gastrointestinal hemorrhage with melena     Note: This dictation was prepared with Dragon dictation. Any transcriptional errors that result from this process are unintentional    Nance Pear, MD 08/17/15 2309

## 2015-08-18 ENCOUNTER — Encounter: Payer: Self-pay | Admitting: Internal Medicine

## 2015-08-18 ENCOUNTER — Inpatient Hospital Stay: Payer: Medicare Other

## 2015-08-18 ENCOUNTER — Other Ambulatory Visit: Payer: Self-pay | Admitting: Infectious Diseases

## 2015-08-18 DIAGNOSIS — K8041 Calculus of bile duct with cholecystitis, unspecified, with obstruction: Secondary | ICD-10-CM

## 2015-08-18 DIAGNOSIS — K922 Gastrointestinal hemorrhage, unspecified: Secondary | ICD-10-CM | POA: Diagnosis present

## 2015-08-18 DIAGNOSIS — K921 Melena: Secondary | ICD-10-CM

## 2015-08-18 LAB — CBC
HCT: 28.7 % — ABNORMAL LOW (ref 35.0–47.0)
Hemoglobin: 9.7 g/dL — ABNORMAL LOW (ref 12.0–16.0)
MCH: 31.1 pg (ref 26.0–34.0)
MCHC: 33.6 g/dL (ref 32.0–36.0)
MCV: 92.5 fL (ref 80.0–100.0)
PLATELETS: 292 10*3/uL (ref 150–440)
RBC: 3.11 MIL/uL — ABNORMAL LOW (ref 3.80–5.20)
RDW: 15 % — ABNORMAL HIGH (ref 11.5–14.5)
WBC: 6.9 10*3/uL (ref 3.6–11.0)

## 2015-08-18 LAB — BASIC METABOLIC PANEL
Anion gap: 7 (ref 5–15)
BUN: 21 mg/dL — AB (ref 6–20)
CALCIUM: 9.1 mg/dL (ref 8.9–10.3)
CO2: 25 mmol/L (ref 22–32)
Chloride: 106 mmol/L (ref 101–111)
Creatinine, Ser: 0.97 mg/dL (ref 0.44–1.00)
GFR calc Af Amer: 58 mL/min — ABNORMAL LOW (ref >60)
GFR, EST NON AFRICAN AMERICAN: 50 mL/min — AB (ref >60)
GLUCOSE: 139 mg/dL — AB (ref 65–99)
POTASSIUM: 3.6 mmol/L (ref 3.5–5.1)
Sodium: 138 mmol/L (ref 135–145)

## 2015-08-18 LAB — HEMOGLOBIN A1C: Hgb A1c MFr Bld: 5.1 % (ref 4.0–6.0)

## 2015-08-18 LAB — TSH: TSH: 2.402 u[IU]/mL (ref 0.350–4.500)

## 2015-08-18 LAB — IRON AND TIBC
Iron: 37 ug/dL (ref 28–170)
Saturation Ratios: 12 % (ref 10.4–31.8)
TIBC: 300 ug/dL (ref 250–450)
UIBC: 263 ug/dL

## 2015-08-18 LAB — FERRITIN: FERRITIN: 54 ng/mL (ref 11–307)

## 2015-08-18 LAB — VITAMIN B12: VITAMIN B 12: 396 pg/mL (ref 180–914)

## 2015-08-18 LAB — HEMOGLOBIN
HEMOGLOBIN: 10 g/dL — AB (ref 12.0–16.0)
HEMOGLOBIN: 9.6 g/dL — AB (ref 12.0–16.0)

## 2015-08-18 LAB — MAGNESIUM: Magnesium: 1.8 mg/dL (ref 1.7–2.4)

## 2015-08-18 LAB — RETICULOCYTES
RBC.: 3.08 MIL/uL — AB (ref 3.80–5.20)
RETIC CT PCT: 4.9 % — AB (ref 0.4–3.1)
Retic Count, Absolute: 151.5 10*3/uL (ref 19.0–183.0)

## 2015-08-18 LAB — FOLATE: Folate: 40 ng/mL (ref >5.9)

## 2015-08-18 MED ORDER — METOPROLOL TARTRATE 50 MG PO TABS
100.0000 mg | ORAL_TABLET | Freq: Two times a day (BID) | ORAL | Status: DC
  Administered 2015-08-18 – 2015-08-21 (×7): 100 mg via ORAL
  Filled 2015-08-18 (×7): qty 2

## 2015-08-18 MED ORDER — ONDANSETRON HCL 4 MG/2ML IJ SOLN: 4.0000 mg | Freq: Four times a day (QID) | INTRAMUSCULAR | Status: DC | PRN

## 2015-08-18 MED ORDER — MORPHINE SULFATE (PF) 2 MG/ML IV SOLN: 1.0000 mg | INTRAVENOUS | Status: DC | PRN

## 2015-08-18 MED ORDER — SODIUM CHLORIDE 0.9% FLUSH
3.0000 mL | Freq: Two times a day (BID) | INTRAVENOUS | Status: DC
  Administered 2015-08-18 – 2015-08-20 (×3): 3 mL via INTRAVENOUS

## 2015-08-18 MED ORDER — FAMOTIDINE IN NACL 20-0.9 MG/50ML-% IV SOLN
20.0000 mg | Freq: Two times a day (BID) | INTRAVENOUS | Status: DC
  Administered 2015-08-18 (×2): 20 mg via INTRAVENOUS
  Filled 2015-08-18 (×3): qty 50

## 2015-08-18 MED ORDER — PANTOPRAZOLE SODIUM 40 MG PO TBEC
40.0000 mg | DELAYED_RELEASE_TABLET | Freq: Two times a day (BID) | ORAL | Status: DC
Start: 2015-08-18 — End: 2015-08-21
  Administered 2015-08-18 – 2015-08-21 (×7): 40 mg via ORAL
  Filled 2015-08-18 (×7): qty 1

## 2015-08-18 MED ORDER — CALCIUM CARBONATE-VITAMIN D 500-200 MG-UNIT PO TABS
1.0000 | ORAL_TABLET | Freq: Two times a day (BID) | ORAL | Status: DC
  Administered 2015-08-18 – 2015-08-21 (×7): 1 via ORAL
  Filled 2015-08-18 (×7): qty 1

## 2015-08-18 MED ORDER — ASPIRIN EC 81 MG PO TBEC: 81.0000 mg | DELAYED_RELEASE_TABLET | Freq: Every day | ORAL | Status: DC

## 2015-08-18 MED ORDER — SODIUM CHLORIDE 0.9 % IV SOLN
INTRAVENOUS | Status: DC
  Administered 2015-08-18 – 2015-08-20 (×7): via INTRAVENOUS

## 2015-08-18 MED ORDER — ACETAMINOPHEN 650 MG RE SUPP: 650.0000 mg | Freq: Four times a day (QID) | RECTAL | Status: DC | PRN

## 2015-08-18 MED ORDER — ONDANSETRON HCL 4 MG PO TABS: 4.0000 mg | ORAL_TABLET | Freq: Four times a day (QID) | ORAL | Status: DC | PRN

## 2015-08-18 MED ORDER — DOCUSATE SODIUM 100 MG PO CAPS
100.0000 mg | ORAL_CAPSULE | Freq: Two times a day (BID) | ORAL | Status: DC
  Administered 2015-08-18 – 2015-08-21 (×8): 100 mg via ORAL
  Filled 2015-08-18 (×8): qty 1

## 2015-08-18 MED ORDER — ACETAMINOPHEN 325 MG PO TABS
650.0000 mg | ORAL_TABLET | Freq: Four times a day (QID) | ORAL | Status: DC | PRN
Start: 2015-08-18 — End: 2015-08-21

## 2015-08-18 MED ORDER — HYDROCHLOROTHIAZIDE 25 MG PO TABS
25.0000 mg | ORAL_TABLET | Freq: Every day | ORAL | Status: DC
  Administered 2015-08-18 – 2015-08-21 (×4): 25 mg via ORAL
  Filled 2015-08-18 (×4): qty 1

## 2015-08-18 NOTE — ED Notes (Signed)
Hospitalist at bedside 

## 2015-08-18 NOTE — Progress Notes (Signed)
Notified Dr Claria Dice that patient had 11 beat run of SVT at 0305. Patient was asleep during it, and is asymptomatic. No new orders at this time.

## 2015-08-18 NOTE — Progress Notes (Addendum)
[place] [facility] [place] NAME: Sheryl Suarez    MR#:  [id number]  DATE OF BIRTH:  29-Apr-1923  SUBJECTIVE:  CHIEF COMPLAINT:   Chief Complaint  Patient presents with  . Abnormal Lab   - Admitted for drop in hemoglobin from 13 to 9.4 on 08/14/2015 -Was complaining of weakness as an outpatient -Having melena. Unfortunately no GI coverage today  REVIEW OF SYSTEMS:  Review of Systems  Constitutional: Positive for malaise/fatigue. Negative for fever and chills.  HENT: Negative for ear discharge, ear pain and nosebleeds.   Eyes: Negative for blurred vision and double vision.  Respiratory: Negative for cough, shortness of breath and wheezing.   Cardiovascular: Negative for chest pain, palpitations and leg swelling.  Gastrointestinal: Positive for melena. Negative for nausea, vomiting, abdominal pain, diarrhea and constipation.  Genitourinary: Negative for dysuria and urgency.  Neurological: Positive for weakness. Negative for dizziness, sensory change, speech change, focal weakness, seizures and headaches.  Psychiatric/Behavioral: Negative for depression.    DRUG ALLERGIES:  No Known Allergies  VITALS:  Blood pressure 132/68, pulse 78, temperature 98.1 F (36.7 C), temperature source Oral, resp. rate 18, height 5\' 2"  (1.575 m), weight 47.537 kg (104 lb 12.8 oz), SpO2 97 %.  PHYSICAL EXAMINATION:  Physical Exam  GENERAL:  82 y.o.-year-old patient lying in the bed with no acute distress.  EYES: Pupils equal, round, reactive to light and accommodation. No scleral icterus. Extraocular muscles intact.  HEENT: Head atraumatic, normocephalic. Oropharynx and nasopharynx clear.  NECK:  Supple, no jugular venous distention. No thyroid enlargement, no tenderness.  LUNGS: Normal breath sounds bilaterally, no wheezing, rales,rhonchi or crepitation. No use of accessory muscles of respiration.  CARDIOVASCULAR: S1, S2 normal. No rubs, or gallops. 3/6  systolic murmur present ABDOMEN: Soft, nontender, nondistended. Bowel sounds present. No organomegaly or mass.  EXTREMITIES: No pedal edema, cyanosis, or clubbing.  NEUROLOGIC: Cranial nerves II through XII are intact. Muscle strength 5/5 in all extremities. Sensation intact. Gait not checked.  PSYCHIATRIC: The patient is alert and oriented x 3.  SKIN: No obvious rash, lesion, or ulcer.    LABORATORY PANEL:   CBC  Recent Labs Lab 08/18/15 0727  WBC 6.9  HGB 9.7*  HCT 28.7*  PLT 292   ------------------------------------------------------------------------------------------------------------------  Chemistries   Recent Labs Lab 08/17/15 1805  NA 138  K 4.1  CL 103  CO2 28  GLUCOSE 104*  BUN 36*  CREATININE 1.06*  CALCIUM 9.8  AST 22  ALT 28  ALKPHOS 261*  BILITOT 0.2*   ------------------------------------------------------------------------------------------------------------------  Cardiac Enzymes No results for input(s): TROPONINI in the last 168 hours. ------------------------------------------------------------------------------------------------------------------  RADIOLOGY:  US Abdomen Limited Ruq  08/18/2015  CLINICAL DATA:  82 year old female with history of cholecystectomy and dilated common bile duct with choledocholithiasis. EXAM: US ABDOMEN LIMITED - RIGHT UPPER QUADRANT COMPARISON:  04/27/2015 abdominal sonogram. FINDINGS: Gallbladder: Surgically absent. Common bile duct: Diameter: 20 mm, unchanged. Re- demonstrated is a calcified shadowing stone in the lower third of the common bile duct measuring 2.2 cm, not appreciably changed. Liver: Diffuse intrahepatic biliary ductal dilatation appears unchanged. Liver parenchymal echogenicity and echotexture are normal. No liver mass. No perihepatic ascites. IMPRESSION: 1. Cholecystectomy. 2. Stable intrahepatic biliary ductal dilatation and marked common bile duct dilatation (CBD diameter 20 mm) with 2.2 cm  choledocholith in the lower third of the common bile duct. 3. Otherwise normal liver. Electronically Signed   By: Ilona Sorrel M.D.   On: 08/18/2015 12:20  EKG:   Orders placed or performed in visit on 07/06/04  . EKG 12-Lead    ASSESSMENT AND PLAN:   82 year old female with past medical history significant for hypertension, CAD was advised to follow up at the hospital for dark stools and drop in hemoglobin.  #1 acute anemia-likely GI losses. -Hemoglobin on 08/14/2015 was 9.4, stable today at 9.7. -Drop from yesterday to today is likely hemodilution. Anemia labs ordered. -Guaiac was positive. No prior colonoscopy noted. -Will need EGD and colonoscopy, either as outpatient or inpatient. If hemoglobin is stable, outpatient referral. - hold NSAIDS - started on protonix bid - Hb q8h, clear liquid diet now  #2 hypertension-on metoprolol and hydrochlorothiazide  #3 CAD-last stenting in 1997. No acute issues. Stable. -Continue outpatient medications. Hold aspirin  #4 DVT prophylaxis-Ted's and SCDs  #5 Chronic CBD stone- Status post cholecystectomy, following with PCP with frequent ultrasounds. Stable at this time. No abdominal pain or nausea, vomiting.    All the records are reviewed and case discussed with Care Management/Social Workerr. Management plans discussed with the patient, family and they are in agreement.  CODE STATUS: Full code  TOTAL TIME TAKING CARE OF THIS PATIENT: 37 minutes.   POSSIBLE D/C IN 1-2 DAYS, DEPENDING ON CLINICAL CONDITION.   Gladstone Lighter M.D on 08/18/2015 at 2:26 PM  Between 7am to 6pm - Pager - [phone]  After 6pm go to [email or web address] - password [email or web address] [email or web address]  [facility]  Office  [phone]  CC: Primary care physician; Leonel Ramsay, MD

## 2015-08-18 NOTE — H&P (Signed)
Sheryl Suarez is an 82 y.o. female.   Chief Complaint: Abnormal labs HPI: The patient with past medical history of hypertension and gallstones presents to the emergency department upon the request of her primary care office. She was informed that her hemoglobin had dropped significantly from previous evaluation. The patient noted that she had been feeling weak and jittery for approximately 7 days. At the beginning of that time she had 1 episode of bilious but nonbloody emesis. Since that time she has not had any nausea or vomiting but admits to decreased appetite. Also of note, the patient has had some cholestasis which her primary care provider had been following with ultrasounds of her right upper quadrant. Her stools have been light in color but normal in consistency and quantity. However a few days ago the patient's stools were found to be dark and tarry. She denies any significant pain but admits to some mild discomfort of her left flank. Due to evidence of GI bleeding emergency department staff called for admission.  Past Medical History  Diagnosis Date  . Cancer ([place])   . Hypertension     Past Surgical History  Procedure Laterality Date  . Appendectomy    . Cholecystectomy    . Abdominal hysterectomy    . Mastectomy Bilateral   . Breast implant exchange      Family History  Problem Relation Age of Onset  . CAD Mother   . CAD Father    Social History:  reports that she has never smoked. She does not have any smokeless tobacco history on file. She reports that she does not drink alcohol. Her drug history is not on file.  Allergies: No Known Allergies  Medications Prior to Admission  Medication Sig Dispense Refill  . aspirin EC 81 MG tablet Take 81 mg by mouth daily.    . Calcium Carbonate-Vitamin D (CALCIUM 600+D) 600-400 MG-UNIT tablet Take 1 tablet by mouth 2 (two) times daily.    . hydrochlorothiazide (HYDRODIURIL) 25 MG tablet Take 25 mg by mouth daily.    . metoprolol  (LOPRESSOR) 100 MG tablet Take 100 mg by mouth 2 (two) times daily.      Results for orders placed or performed during the hospital encounter of 08/17/15 (from the past 48 hour(s))  Comprehensive metabolic panel     Status: Abnormal   Collection Time: 08/17/15  6:05 PM  Result Value Ref Range   Sodium 138 135 - 145 mmol/L   Potassium 4.1 3.5 - 5.1 mmol/L   Chloride 103 101 - 111 mmol/L   CO2 28 22 - 32 mmol/L   Glucose, Bld 104 (H) 65 - 99 mg/dL   BUN 36 (H) 6 - 20 mg/dL   Creatinine, Ser 1.06 (H) 0.44 - 1.00 mg/dL   Calcium 9.8 8.9 - 10.3 mg/dL   Total Protein 6.8 6.5 - 8.1 g/dL   Albumin 3.5 3.5 - 5.0 g/dL   AST 22 15 - 41 U/L   ALT 28 14 - 54 U/L   Alkaline Phosphatase 261 (H) 38 - 126 U/L   Total Bilirubin 0.2 (L) 0.3 - 1.2 mg/dL   GFR calc non Af Amer 45 (L) >60 mL/min   GFR calc Af Amer 52 (L) >60 mL/min    Comment: (NOTE) The eGFR has been calculated using the CKD EPI equation. This calculation has not been validated in all clinical situations. eGFR's persistently <60 mL/min signify possible Chronic Kidney Disease.    Anion gap 7 5 - 15  CBC     Status: Abnormal   Collection Time: 08/17/15  6:05 PM  Result Value Ref Range   WBC 8.7 3.6 - 11.0 K/uL   RBC 3.35 (L) 3.80 - 5.20 MIL/uL   Hemoglobin 10.5 (L) 12.0 - 16.0 g/dL   HCT 30.8 (L) 35.0 - 47.0 %   MCV 92.1 80.0 - 100.0 fL   MCH 31.4 26.0 - 34.0 pg   MCHC 34.1 32.0 - 36.0 g/dL   RDW 15.3 (H) 11.5 - 14.5 %   Platelets 375 150 - 440 K/uL  Type and screen [place]     Status: None   Collection Time: 08/17/15  6:05 PM  Result Value Ref Range   ABO/RH(D) O POS    Antibody Screen NEG    Sample Expiration 08/20/2015    No results found.  Review of Systems  Constitutional: Negative for fever and chills.  HENT: Negative for sore throat and tinnitus.   Eyes: Negative for blurred vision and redness.  Respiratory: Negative for cough and shortness of breath.   Cardiovascular: Negative for  chest pain, palpitations, orthopnea and PND.  Gastrointestinal: Negative for nausea, vomiting, abdominal pain and diarrhea.  Genitourinary: Negative for dysuria, urgency and frequency.  Musculoskeletal: Negative for myalgias and joint pain.       Mild, intermittent left flank pain  Skin: Negative for rash.       No lesions  Neurological: Negative for speech change, focal weakness and weakness.  Endo/Heme/Allergies: Does not bruise/bleed easily.       No temperature intolerance  Psychiatric/Behavioral: Negative for depression and suicidal ideas.    Blood pressure 172/75, pulse 89, temperature 98 F (36.7 C), temperature source Oral, resp. rate 18, height '5\' 2"'  (1.575 m), weight 48.081 kg (106 lb), SpO2 99 %. Physical Exam  Vitals reviewed. Constitutional: She is oriented to person, place, and time. She appears well-developed and well-nourished. No distress.  HENT:  Head: Normocephalic and atraumatic.  Mouth/Throat: Oropharynx is clear and moist.  Eyes: Conjunctivae and EOM are normal. Pupils are equal, round, and reactive to light. No scleral icterus.  Neck: Normal range of motion. Neck supple. No JVD present. No tracheal deviation present. No thyromegaly present.  Cardiovascular: Normal rate, regular rhythm and normal heart sounds.  Exam reveals no gallop and no friction rub.   No murmur heard. Respiratory: Effort normal and breath sounds normal.  GI: Soft. Bowel sounds are normal. She exhibits no distension and no mass. There is tenderness (mild, left side/flank). There is no rebound and no guarding.  Genitourinary:  Deferred  Lymphadenopathy:    She has no cervical adenopathy.  Neurological: She is alert and oriented to person, place, and time. No cranial nerve deficit. She exhibits normal muscle tone.  Skin: Skin is warm and dry. No rash noted. No erythema.  Psychiatric: She has a normal mood and affect. Her behavior is normal. Judgment and thought content normal.      Assessment/Plan This is a 82 year old female in overall good health admitted for GI bleed. 1. GI bleed: Vitals stable, hemoglobin 10.9; no transfusion necessary at this time. I started the patient on IV and pantoprazole and ordered a gastroenterology consult. Differential diagnosis includes slow diverticular bleed. Nothing by mouth for now. Hydrate with intravenous fluid 2. Essential hypertension: Continue metoprolol and hydrochlorothiazide. Labetalol as needed 3. Coronary artery disease: Stable; hold aspirin due to GI bleed 4. DVT prophylaxis: SCDs 5. GI prophylaxis: As above The patient is a full code.  Time spent on admission orders and patient care approximately 45 minutes  Harrie Foreman, MD 08/18/2015, 2:20 AM

## 2015-08-18 NOTE — ED Notes (Signed)
Assisted pt up to bathroom and back into bed, Pt ambulated with steady gait.

## 2015-08-19 LAB — CBC
HCT: 31.2 % — ABNORMAL LOW (ref 35.0–47.0)
Hemoglobin: 10.4 g/dL — ABNORMAL LOW (ref 12.0–16.0)
MCH: 31.1 pg (ref 26.0–34.0)
MCHC: 33.3 g/dL (ref 32.0–36.0)
MCV: 93.3 fL (ref 80.0–100.0)
PLATELETS: 373 10*3/uL (ref 150–440)
RBC: 3.35 MIL/uL — ABNORMAL LOW (ref 3.80–5.20)
RDW: 15.6 % — AB (ref 11.5–14.5)
WBC: 7.5 10*3/uL (ref 3.6–11.0)

## 2015-08-19 MED ORDER — PANTOPRAZOLE SODIUM 40 MG PO TBEC: 40.0000 mg | DELAYED_RELEASE_TABLET | Freq: Two times a day (BID) | ORAL | Status: DC

## 2015-08-19 NOTE — Consult Note (Signed)
GI Inpatient Consult Note  Reason for Consult: GI Bleed    Attending Requesting Consult: Dr. Tressia Miners   History of Present Illness: Sheryl Suarez is a 82 y.o. female seen for evaluation of GI bleed at the request of Dr. Tressia Miners.   She reports approximately 2 weeks ago developing an episode of vomiting. She felt this was due to her common bile duct stone that has been followed with every other week CBC/CMP.  About 1 week ago she developed some black stools, black/tarry stools lasted approximately 4-5 days.  She last had bleeding approximately 4 days ago.  She denies any nausea or vomiting or epigastric pain when having a tarry stools.  She denies any bright red blood or maroon stools.  She does take Aleve nightly to help her sleep at bedtime for joint pain.  She is not on a PPI. She denies any GERD symptoms.  She had a brown stool this morning.  She also had a brown stool day prior to admission.  Black stools have completely resolved at this point.  She feels very well today. Angioplasty in 1990s. She has done well from cardiac standpoint since.   Last Colonoscopy: None prior Last Endoscopy: None prior    Past Medical History:  Past Medical History  Diagnosis Date  . Breast cancer ([place])     remission  . Hypertension     Problem List: Patient Active Problem List   Diagnosis Date Noted  . GI bleed 08/18/2015    Past Surgical History: Past Surgical History  Procedure Laterality Date  . Appendectomy    . Cholecystectomy    . Abdominal hysterectomy    . Mastectomy Bilateral   . Breast implant exchange      Allergies: No Known Allergies  Home Medications: Prescriptions prior to admission  Medication Sig Dispense Refill Last Dose  . aspirin EC 81 MG tablet Take 81 mg by mouth daily.   08/17/2015 at 0700  . Calcium Carbonate-Vitamin D (CALCIUM 600+D) 600-400 MG-UNIT tablet Take 1 tablet by mouth 2 (two) times daily.   08/17/2015 at Unknown time  . hydrochlorothiazide  (HYDRODIURIL) 25 MG tablet Take 25 mg by mouth daily.   08/17/2015 at Unknown time  . metoprolol (LOPRESSOR) 100 MG tablet Take 100 mg by mouth 2 (two) times daily.   08/17/2015 at 0700   Home medication reconciliation was completed with the patient.   Scheduled Inpatient Medications:   . calcium-vitamin D  1 tablet Oral BID  . docusate sodium  100 mg Oral BID  . hydrochlorothiazide  25 mg Oral Daily  . metoprolol  100 mg Oral BID  . pantoprazole  40 mg Oral BID  . sodium chloride flush  3 mL Intravenous Q12H    Continuous Inpatient Infusions:   . sodium chloride 60 mL/hr at 08/19/15 1300    PRN Inpatient Medications:  acetaminophen **OR** acetaminophen, ondansetron **OR** ondansetron (ZOFRAN) IV  Family History: family history includes CAD in her father and mother.   Social History:   reports that she has never smoked. She does not have any smokeless tobacco history on file. She reports that she does not drink alcohol.    Review of Systems: Constitutional: Weight is stable.  Eyes: No changes in vision. ENT: No oral lesions, sore throat.  GI: see HPI.  Heme/Lymph: No easy bruising.  CV: No chest pain.  GU: No hematuria.  Integumentary: No rashes.  Neuro: No headaches.  Psych: No depression/anxiety.  Endocrine: No heat/cold intolerance.  Allergic/Immunologic: No urticaria.  Resp: No cough, SOB.  Musculoskeletal: No joint swelling.    Physical Examination: BP 136/53 mmHg  Pulse 72  Temp(Src) 97.8 F (36.6 C) (Oral)  Resp 16  Ht 5\' 2"  (1.575 m)  Wt 47.537 kg (104 lb 12.8 oz)  BMI 19.16 kg/m2  SpO2 100% Gen: NAD, alert and oriented x 4 HEENT: PEERLA, EOMI, Neck: supple, no JVD or thyromegaly Chest: CTA bilaterally, no wheezes, crackles, or other adventitious sounds CV: RRR, no m/g/c/r Abd: soft, NT, ND, +BS in all four quadrants; no HSM, guarding, ridigity, or rebound tenderness  Rectal - no stool in rectal vault.   Ext: no edema, well perfused with 2+  pulses, Skin: no rash or lesions noted Lymph: no LAD  Data: Lab Results  Component Value Date   WBC 7.5 08/19/2015   HGB 10.4* 08/19/2015   HCT 31.2* 08/19/2015   MCV 93.3 08/19/2015   PLT 373 08/19/2015    Recent Labs Lab 08/18/15 1413 08/18/15 2116 08/19/15 0559  HGB 10.0* 9.6* 10.4*   Lab Results  Component Value Date   NA 138 08/18/2015   K 3.6 08/18/2015   CL 106 08/18/2015   CO2 25 08/18/2015   BUN 21* 08/18/2015   CREATININE 0.97 08/18/2015   Lab Results  Component Value Date   ALT 28 08/17/2015   AST 22 08/17/2015   ALKPHOS 261* 08/17/2015   BILITOT 0.2* 08/17/2015   No results for input(s): APTT, INR, PTT in the last 168 hours.   Assessment/Plan: [name] is a 82 y.o. female addmitted for tarry stool and drop in her Hgb from 13 at baseline to 9.4 on outpatient labs.  Clinically GI bleeding seems to have improved.  No obvious blood in stools and hemoglobin stable.  Given NSAID use concern for peptic ulcer disease as etiology.  Certainly cannot rule out malignancy as etiology.  Given her advanced age feel nonsedated evaluation initially most appropriate.  Dr. Gustavo Lah to discuss upper GI versus EGD. She has never had EGD/colonsocopy.   Recommend stop nightly NSAIDs, discharge on twice daily PPI, upper GI study in 1 week. Will check H&H at follow-up office visit and schedule upper GI in approximately 1 week  CBD Dilation - is considering ERCP w/ Dr. Allen Norris. Defer during hospitalization.   *Case discussed w/ Dr. Gustavo Lah.  Thank you for the consult. Please call with questions or concerns.  Ronney Asters, PA-C [place]

## 2015-08-19 NOTE — Progress Notes (Signed)
Patient very aggitated and angry regarding bed alarm; getting OOB multiple times overnight after explaining, necessity to keep patient safe and so that she does not fall; voiced strongly that she is capable of taking care of herself and that she doesn't need to be treated like a child and refuses bed alarm. Barbaraann Faster, RN; 7:17 AM 08/19/2015

## 2015-08-19 NOTE — Consult Note (Signed)
Please see full GI consult by Ms Constance Haw.  Patietn admitted after outpatient labs showed drop of HGB in the setting of black stools and frequent use of nsaids. No nsaide for several days now, stool more normal color, hgb stable, bun improving, no transfusing.  Recommend EGD tomorrow for luminal evaluation, continue current ppi.    I have discussed the risks benefits and complications of procedures to include not limited to bleeding, infection, perforation and the risk of sedation and the patient wishes to proceed. Further recs to follow.

## 2015-08-19 NOTE — Care Management Obs Status (Signed)
[facility] NOTIFICATION   Patient Details  Name: Sheryl Suarez MRN: [id number] Date of Birth: March 22, 1923   Medicare Observation Status Notification Given:  Yes  Code 44 issued, signed and send to HIM.  Code 44 short form completed   Beverly Sessions, RN 08/19/2015, 2:23 PM

## 2015-08-19 NOTE — Progress Notes (Signed)
[facility] at [place] NAME: Libia Fye    MR#:  [id number]  DATE OF BIRTH:  [birth year]  SUBJECTIVE:  CHIEF COMPLAINT:   Chief Complaint  Patient presents with  . Abnormal Lab   - Hemoglobin has remained stable between 9 and 10. No further melena. -Pending GI consult. Patient upset last night about bed alarm. Was able to ambulate in hallways today  REVIEW OF SYSTEMS:  Review of Systems  Constitutional: Negative for fever, chills and malaise/fatigue.  HENT: Negative for ear discharge, ear pain and nosebleeds.   Eyes: Negative for blurred vision and double vision.  Respiratory: Negative for cough, shortness of breath and wheezing.   Cardiovascular: Negative for chest pain, palpitations and leg swelling.  Gastrointestinal: Positive for melena. Negative for nausea, vomiting, abdominal pain, diarrhea and constipation.  Genitourinary: Negative for dysuria and urgency.  Neurological: Negative for dizziness, sensory change, speech change, focal weakness, seizures and headaches.  Psychiatric/Behavioral: Negative for depression.    DRUG ALLERGIES:  No Known Allergies  VITALS:  Blood pressure 114/42, pulse 69, temperature 98.3 F (36.8 C), temperature source Oral, resp. rate 20, height 5\' 2"  (1.575 m), weight 47.537 kg (104 lb 12.8 oz), SpO2 98 %.  PHYSICAL EXAMINATION:  Physical Exam  GENERAL:  82 y.o.-year-old patient lying in the bed with no acute distress.  EYES: Pupils equal, round, reactive to light and accommodation. No scleral icterus. Extraocular muscles intact.  HEENT: Head atraumatic, normocephalic. Oropharynx and nasopharynx clear.  NECK:  Supple, no jugular venous distention. No thyroid enlargement, no tenderness.  LUNGS: Normal breath sounds bilaterally, no wheezing, rales,rhonchi or crepitation. No use of accessory muscles of respiration.  CARDIOVASCULAR: S1, S2 normal. No rubs, or gallops. 3/6 systolic murmur  present ABDOMEN: Soft, nontender, nondistended. Bowel sounds present. No organomegaly or mass.  EXTREMITIES: No pedal edema, cyanosis, or clubbing.  NEUROLOGIC: Cranial nerves II through XII are intact. Muscle strength 5/5 in all extremities. Sensation intact. Gait not checked.  PSYCHIATRIC: The patient is alert and oriented x 3.  SKIN: No obvious rash, lesion, or ulcer.    LABORATORY PANEL:   CBC  Recent Labs Lab 08/19/15 0559  WBC 7.5  HGB 10.4*  HCT 31.2*  PLT 373   ------------------------------------------------------------------------------------------------------------------  Chemistries   Recent Labs Lab 08/17/15 1805 08/18/15 1413  NA 138 138  K 4.1 3.6  CL 103 106  CO2 28 25  GLUCOSE 104* 139*  BUN 36* 21*  CREATININE 1.06* 0.97  CALCIUM 9.8 9.1  MG  --  1.8  AST 22  --   ALT 28  --   ALKPHOS 261*  --   BILITOT 0.2*  --    ------------------------------------------------------------------------------------------------------------------  Cardiac Enzymes No results for input(s): TROPONINI in the last 168 hours. ------------------------------------------------------------------------------------------------------------------  RADIOLOGY:  US Abdomen Limited Ruq  08/18/2015  CLINICAL DATA:  82 year old female with history of cholecystectomy and dilated common bile duct with choledocholithiasis. EXAM: US ABDOMEN LIMITED - RIGHT UPPER QUADRANT COMPARISON:  04/27/2015 abdominal sonogram. FINDINGS: Gallbladder: Surgically absent. Common bile duct: Diameter: 20 mm, unchanged. Re- demonstrated is a calcified shadowing stone in the lower third of the common bile duct measuring 2.2 cm, not appreciably changed. Liver: Diffuse intrahepatic biliary ductal dilatation appears unchanged. Liver parenchymal echogenicity and echotexture are normal. No liver mass. No perihepatic ascites. IMPRESSION: 1. Cholecystectomy. 2. Stable intrahepatic biliary ductal dilatation and marked  common bile duct dilatation (CBD diameter 20 mm) with 2.2 cm choledocholith in the  lower third of the common bile duct. 3. Otherwise normal liver. Electronically Signed   By: Ilona Sorrel M.D.   On: 08/18/2015 12:20    EKG:   Orders placed or performed in visit on 07/06/04  . EKG 12-Lead    ASSESSMENT AND PLAN:   82 year old female with past medical history significant for hypertension, CAD was advised to follow up at the hospital for dark stools and drop in hemoglobin.  #1 acute anemia-likely GI losses. -Hemoglobin on 08/14/2015 was 9.4 as outpatient, stable now between 9-10. -Guaiac was positive. No prior colonoscopy noted. -Will need EGD and colonoscopy, GI consulted- possible EGD tomorrow - No NSAIDS - started on protonix bid - continue clear liquid diet now  #2 hypertension-on metoprolol and hydrochlorothiazide  #3 CAD-last Angioplasty in 1997. No acute issues. Stable. -Continue outpatient medications. Hold aspirin  #4 DVT prophylaxis-Ted's and SCDs  #5 Chronic CBD stone- Status post cholecystectomy, following with PCP with frequent ultrasounds. Stable at this time. No abdominal pain or nausea, vomiting.    All the records are reviewed and case discussed with Care Management/Social Workerr. Management plans discussed with the patient, family and they are in agreement.  CODE STATUS: Full code  TOTAL TIME TAKING CARE OF THIS PATIENT: 37 minutes.   POSSIBLE D/C Tomorrow, DEPENDING ON CLINICAL CONDITION.   Gladstone Lighter M.D on 08/19/2015 at 12:55 PM  Between 7am to 6pm - Pager - [phone]  After 6pm go to [email or web address] - password [email or web address] [facility]  [facility]  Office  [phone]  CC: Primary care physician; Leonel Ramsay, MD

## 2015-08-20 ENCOUNTER — Encounter
Admission: EM | Disposition: A | Discharge status: Home / Self Care | Payer: Self-pay | Source: Home / Self Care | Attending: Emergency Medicine

## 2015-08-20 ENCOUNTER — Observation Stay: Payer: Medicare Other | Admitting: Anesthesiology

## 2015-08-20 ENCOUNTER — Encounter: Payer: Self-pay | Admitting: *Deleted

## 2015-08-20 HISTORY — PX: ESOPHAGOGASTRODUODENOSCOPY (EGD) WITH PROPOFOL: SHX5813

## 2015-08-20 LAB — BASIC METABOLIC PANEL
Anion gap: 8 (ref 5–15)
BUN: 19 mg/dL (ref 6–20)
CHLORIDE: 107 mmol/L (ref 101–111)
CO2: 22 mmol/L (ref 22–32)
CREATININE: 1.03 mg/dL — AB (ref 0.44–1.00)
Calcium: 8.4 mg/dL — ABNORMAL LOW (ref 8.9–10.3)
GFR, EST AFRICAN AMERICAN: 54 mL/min — AB (ref >60)
GFR, EST NON AFRICAN AMERICAN: 46 mL/min — AB (ref >60)
Glucose, Bld: 108 mg/dL — ABNORMAL HIGH (ref 65–99)
POTASSIUM: 3.2 mmol/L — AB (ref 3.5–5.1)
SODIUM: 137 mmol/L (ref 135–145)

## 2015-08-20 LAB — CBC
HCT: 24.9 % — ABNORMAL LOW (ref 35.0–47.0)
HEMOGLOBIN: 8.4 g/dL — AB (ref 12.0–16.0)
MCH: 30.9 pg (ref 26.0–34.0)
MCHC: 33.8 g/dL (ref 32.0–36.0)
MCV: 91.2 fL (ref 80.0–100.0)
PLATELETS: 252 10*3/uL (ref 150–440)
RBC: 2.73 MIL/uL — AB (ref 3.80–5.20)
RDW: 15.8 % — ABNORMAL HIGH (ref 11.5–14.5)
WBC: 16.2 10*3/uL — ABNORMAL HIGH (ref 3.6–11.0)

## 2015-08-20 LAB — CBC WITH DIFFERENTIAL/PLATELET
BASOS ABS: 0.1 10*3/uL (ref 0–0.1)
Basophils Relative: 0 %
EOS ABS: 0 10*3/uL (ref 0–0.7)
EOS PCT: 0 %
HCT: 25 % — ABNORMAL LOW (ref 35.0–47.0)
HEMOGLOBIN: 8.5 g/dL — AB (ref 12.0–16.0)
LYMPHS ABS: 1.2 10*3/uL (ref 1.0–3.6)
Lymphocytes Relative: 8 %
MCH: 31 pg (ref 26.0–34.0)
MCHC: 34 g/dL (ref 32.0–36.0)
MCV: 91.2 fL (ref 80.0–100.0)
Monocytes Absolute: 0.6 10*3/uL (ref 0.2–0.9)
Monocytes Relative: 4 %
NEUTROS PCT: 88 %
Neutro Abs: 12.3 10*3/uL — ABNORMAL HIGH (ref 1.4–6.5)
PLATELETS: 243 10*3/uL (ref 150–440)
RBC: 2.74 MIL/uL — AB (ref 3.80–5.20)
RDW: 16 % — ABNORMAL HIGH (ref 11.5–14.5)
WBC: 14.1 10*3/uL — AB (ref 3.6–11.0)

## 2015-08-20 LAB — URINALYSIS COMPLETE WITH MICROSCOPIC (ARMC ONLY)
BILIRUBIN URINE: NEGATIVE
Glucose, UA: NEGATIVE mg/dL
HGB URINE DIPSTICK: NEGATIVE
NITRITE: POSITIVE — AB
PH: 5 (ref 5.0–8.0)
PROTEIN: NEGATIVE mg/dL
SPECIFIC GRAVITY, URINE: 1.009 (ref 1.005–1.030)

## 2015-08-20 SURGERY — ESOPHAGOGASTRODUODENOSCOPY (EGD) WITH PROPOFOL
Anesthesia: General

## 2015-08-20 MED ORDER — PHENYLEPHRINE HCL 10 MG/ML IJ SOLN
INTRAMUSCULAR | Status: DC | PRN
  Administered 2015-08-20: 100 ug via INTRAVENOUS

## 2015-08-20 MED ORDER — LIDOCAINE 2% (20 MG/ML) 5 ML SYRINGE
INTRAMUSCULAR | Status: DC | PRN
  Administered 2015-08-20: 30 mg via INTRAVENOUS

## 2015-08-20 MED ORDER — PROPOFOL 10 MG/ML IV BOLUS
INTRAVENOUS | Status: DC | PRN
  Administered 2015-08-20: 50 mg via INTRAVENOUS

## 2015-08-20 MED ORDER — FENTANYL CITRATE (PF) 100 MCG/2ML IJ SOLN
INTRAMUSCULAR | Status: DC | PRN
  Administered 2015-08-20: 50 ug via INTRAVENOUS

## 2015-08-20 MED ORDER — MIDAZOLAM HCL 5 MG/5ML IJ SOLN
INTRAMUSCULAR | Status: DC | PRN
  Administered 2015-08-20: 1 mg via INTRAVENOUS

## 2015-08-20 MED ORDER — PROPOFOL 500 MG/50ML IV EMUL
INTRAVENOUS | Status: DC | PRN
  Administered 2015-08-20: 120 ug/kg/min via INTRAVENOUS

## 2015-08-20 MED ORDER — POTASSIUM CHLORIDE CRYS ER 20 MEQ PO TBCR
20.0000 meq | EXTENDED_RELEASE_TABLET | Freq: Once | ORAL | Status: AC
  Administered 2015-08-20: 20 meq via ORAL
  Filled 2015-08-20: qty 1

## 2015-08-20 NOTE — Anesthesia Postprocedure Evaluation (Signed)
Anesthesia Post Note  Patient: Sheryl Suarez  Procedure(s) Performed: Procedure(s) (LRB): ESOPHAGOGASTRODUODENOSCOPY (EGD) WITH PROPOFOL (N/A)  Patient location during evaluation: Endoscopy Anesthesia Type: General Level of consciousness: awake and alert Pain management: pain level controlled Vital Signs Assessment: post-procedure vital signs reviewed and stable Respiratory status: spontaneous breathing and respiratory function stable Cardiovascular status: stable Anesthetic complications: no    Last Vitals:  Filed Vitals:   08/20/15 1443 08/20/15 1453  BP: 102/39 110/54  Pulse: 75 73  Temp:    Resp: 17 18    Last Pain:  Filed Vitals:   08/20/15 1455  PainSc: Asleep                 Loukas Antonson K

## 2015-08-20 NOTE — Transfer of Care (Signed)
Immediate Anesthesia Transfer of Care Note  Patient: Quintella Baton  Procedure(s) Performed: Procedure(s): ESOPHAGOGASTRODUODENOSCOPY (EGD) WITH PROPOFOL (N/A)  Patient Location: PACU and Endoscopy Unit  Anesthesia Type:General  Level of Consciousness: sedated  Airway & Oxygen Therapy: Patient Spontanous Breathing and Patient connected to nasal cannula oxygen  Post-op Assessment: Report given to RN and Post -op Vital signs reviewed and stable  Post vital signs: Reviewed and stable  Last Vitals:  Filed Vitals:   08/20/15 0552 08/20/15 1344  BP: 111/46 133/41  Pulse: 92 72  Temp: 37.1 C 37.2 C  Resp: 20 18    Last Pain: There were no vitals filed for this visit.       Complications: No apparent anesthesia complications

## 2015-08-20 NOTE — Progress Notes (Addendum)
[place] at [place] NAME: Cailynn Lefrancois    MR#:  [id number]  DATE OF BIRTH:  07-08-1923  SUBJECTIVE:  CHIEF COMPLAINT:   Chief Complaint  Patient presents with  . Abnormal Lab   - . No further melena. egd today.  REVIEW OF SYSTEMS:  Review of Systems  Constitutional: Negative for fever, chills and malaise/fatigue.  HENT: Negative for ear discharge, ear pain and nosebleeds.   Eyes: Negative for blurred vision and double vision.  Respiratory: Negative for cough, shortness of breath and wheezing.   Cardiovascular: Negative for chest pain, palpitations and leg swelling.  Gastrointestinal: Negative for nausea, vomiting, abdominal pain, diarrhea, constipation and melena.  Genitourinary: Negative for dysuria and urgency.  Neurological: Negative for dizziness, sensory change, speech change, focal weakness, seizures and headaches.  Psychiatric/Behavioral: Negative for depression.    DRUG ALLERGIES:  No Known Allergies  VITALS:  Blood pressure 111/46, pulse 92, temperature 98.8 F (37.1 C), temperature source Oral, resp. rate 20, height 5\' 2"  (1.575 m), weight 48.535 kg (107 lb), SpO2 100 %.  PHYSICAL EXAMINATION:  Physical Exam  GENERAL:  82 y.o.-year-old patient lying in the bed with no acute distress.  EYES: Pupils equal, round, reactive to light and accommodation. No scleral icterus. Extraocular muscles intact.  HEENT: Head atraumatic, normocephalic. Oropharynx and nasopharynx clear.  NECK:  Supple, no jugular venous distention. No thyroid enlargement, no tenderness.  LUNGS: Normal breath sounds bilaterally, no wheezing, rales,rhonchi or crepitation. No use of accessory muscles of respiration.  CARDIOVASCULAR: S1, S2 normal. No rubs, or gallops. 3/6 systolic murmur present ABDOMEN: Soft, nontender, nondistended. Bowel sounds present. No organomegaly or mass.  EXTREMITIES: No pedal edema, cyanosis, or clubbing.  NEUROLOGIC:  Cranial nerves II through XII are intact. Muscle strength 5/5 in all extremities. Sensation intact. Gait not checked.  PSYCHIATRIC: The patient is alert and oriented x 3.  SKIN: No obvious rash, lesion, or ulcer.    LABORATORY PANEL:   CBC  Recent Labs Lab 08/20/15 0449  WBC 16.2*  HGB 8.4*  HCT 24.9*  PLT 252   ------------------------------------------------------------------------------------------------------------------  Chemistries   Recent Labs Lab 08/17/15 1805 08/18/15 1413 08/20/15 0449  NA 138 138 137  K 4.1 3.6 3.2*  CL 103 106 107  CO2 28 25 22   GLUCOSE 104* 139* 108*  BUN 36* 21* 19  CREATININE 1.06* 0.97 1.03*  CALCIUM 9.8 9.1 8.4*  MG  --  1.8  --   AST 22  --   --   ALT 28  --   --   ALKPHOS 261*  --   --   BILITOT 0.2*  --   --    ------------------------------------------------------------------------------------------------------------------  Cardiac Enzymes No results for input(s): TROPONINI in the last 168 hours. ------------------------------------------------------------------------------------------------------------------  RADIOLOGY:  No results found.  EKG:   Orders placed or performed in visit on 07/06/04  . EKG 12-Lead    ASSESSMENT AND PLAN:   82 year old female with past medical history significant for hypertension, CAD was advised to follow up at the hospital for dark stools and drop in hemoglobin.  #1 acute anemia-likely GI losses. -Hemoglobin on 08/14/2015 was 9.4 as outpatient, Drop in hb from 10.4 to 8.4,likley dilutional as  Pt has no futrher female na  Leucocytosis;likley reactive,check cbc with diff,UA, Pt does not look sick,could be reactive. -Guaiac was positive. No prior colonoscopy noted. -EGD today, - No NSAIDS - started on protonix bid -  .Clear liquids after the  EGD, advance her diet, likely discharge tomorrow morning.  #2 hypertension-on metoprolol and hydrochlorothiazide  #3 CAD-last Angioplasty in  1997. No acute issues. Stable. -Continue outpatient medications. Hold aspirin  #4 DVT prophylaxis-Ted's and SCDs  #5 Chronic CBD stone- Status post cholecystectomy, following with PCP with frequent ultrasounds. Stable at this time. No abdominal pain or nausea, vomiting.    All the records are reviewed and case discussed with Care Management/Social Workerr. Management plans discussed with the patient, family and they are in agreement.  CODE STATUS: Full code  TOTAL TIME TAKING CARE OF THIS PATIENT: 37 minutes.   POSSIBLE D/C Tomorrow, DEPENDING ON CLINICAL CONDITION.   Epifanio Lesches M.D on 08/20/2015 at 12:49 PM  Between 7am to 6pm - Pager - [phone]  After 6pm go to [email or web address] - password [email or web address] [facility]  [facility]  Office  [phone]  CC: Primary care physician; Leonel Ramsay, MD

## 2015-08-20 NOTE — Op Note (Signed)
[facility] Patient Name: [name] Procedure Date: 08/20/2015 2:13 PM MRN: [id number] Account #: [id number] Date of Birth: [birth year] Admit Type: Inpatient Age: 82 Room: [facility] ENDO ROOM 1 Gender: Female Note Status: Finalized Procedure:            Upper GI endoscopy Indications:          Melena Providers:            Lollie Sails, MD Referring MD:         Adrian Prows (Referring MD) Medicines:            Monitored Anesthesia Care Complications:        No immediate complications. Procedure:            Pre-Anesthesia Assessment:                       - ASA Grade Assessment: III - A patient with severe                        systemic disease.                       After obtaining informed consent, the endoscope was                        passed under direct vision. Throughout the procedure,                        the patient's blood pressure, pulse, and oxygen                        saturations were monitored continuously. The Endoscope                        was introduced through the mouth, and advanced to the                        pylorus. The upper GI endoscopy was accomplished                        without difficulty. The patient tolerated the procedure                        well. Findings:      The examined esophagus was normal.      Diffuse mild inflammation characterized by congestion (edema) and       erythema was found in the gastric body.      Patchy moderate inflammation characterized by congestion (edema),       erosions and erythema was found in the gastric antrum.      One non-bleeding cratered gastric ulcer with pigmented material under       eschar was found at the pylorus. The lesion was 4 mm in largest       dimension. There is no apparent lesion seen through the pyloric opening,       however I did not pass the scope beyond the ulcer due to the narrowness       of the pyloric channel, likely source of UGI  bleeding.      There was no bleeding throughout and no hematin material. Impression:           -  Normal esophagus.                       - Gastritis.                       - Erosive gastritis.                       - Non-bleeding gastric ulcer with pigmented material.                       - No specimens collected. Recommendation:       - Use Protonix (pantoprazole) 40 mg PO BID daily.                       - Use sucralfate tablets 1 gram PO QID for 1 month.                       - Clear liquid diet for 2 days.                       - Full liquid diet for 3 days, then advance as                        tolerated to low residue diet for 1 week.                       - Perform an H. pylori serology today.                       - No aspirin, ibuprofen, naproxen, or other                        non-steroidal anti-inflammatory drugs. Procedure Code(s):    --- Professional ---                       [phone], 60, Esophagogastroduodenoscopy, flexible,                        transoral; diagnostic, including collection of                        specimen(s) by brushing or washing, when performed                        (separate procedure) Diagnosis Code(s):    --- Professional ---                       K29.70, Gastritis, unspecified, without bleeding                       K29.60, Other gastritis without bleeding                       K25.9, Gastric ulcer, unspecified as acute or chronic,                        without hemorrhage or perforation                       K92.1, Melena (includes Hematochezia) CPT copyright 2016 American [employer] Association. All  rights reserved. The codes documented in this report are preliminary and upon coder review may  be revised to meet current compliance requirements. Lollie Sails, MD 08/20/2015 2:37:35 PM This report has been signed electronically. Number of Addenda: 0 Note Initiated On: 08/20/2015 2:13 PM      [facility]

## 2015-08-20 NOTE — Anesthesia Preprocedure Evaluation (Signed)
Anesthesia Evaluation  Patient identified by MRN, date of birth, ID band Patient awake    Reviewed: Allergy & Precautions, NPO status , Patient's Chart, lab work & pertinent test results  History of Anesthesia Complications Negative for: history of anesthetic complications  Airway Mallampati: III       Dental  (+) Partial Upper   Pulmonary neg pulmonary ROS,           Cardiovascular hypertension, Pt. on medications and Pt. on home beta blockers + Past MI       Neuro/Psych negative neurological ROS     GI/Hepatic negative GI ROS, Neg liver ROS,   Endo/Other  negative endocrine ROS  Renal/GU negative Renal ROS     Musculoskeletal   Abdominal   Peds  Hematology negative hematology ROS (+)   Anesthesia Other Findings   Reproductive/Obstetrics                             Anesthesia Physical Anesthesia Plan  ASA: III  Anesthesia Plan: General   Post-op Pain Management:    Induction: Intravenous  Airway Management Planned: Nasal Cannula  Additional Equipment:   Intra-op Plan:   Post-operative Plan:   Informed Consent: I have reviewed the patients History and Physical, chart, labs and discussed the procedure including the risks, benefits and alternatives for the proposed anesthesia with the patient or authorized representative who has indicated his/her understanding and acceptance.     Plan Discussed with:   Anesthesia Plan Comments:         Anesthesia Quick Evaluation

## 2015-08-20 NOTE — Consult Note (Signed)
Subjective: Patient seen for melena. Patient has been hemodynamically stable overnight. There's been no nausea vomiting or abdominal pain. She did have one bowel movement overnight that was not recorded but patient states it was brown in color and not black like previously.  Objective: Vital signs in last 24 hours: Temp:  [98.1 F (36.7 C)-98.9 F (37.2 C)] 98.9 F (37.2 C) (06/29 1344) Pulse Rate:  [72-94] 72 (06/29 1344) Resp:  [18-20] 18 (06/29 1344) BP: (110-133)/(41-62) 133/41 mmHg (06/29 1344) SpO2:  [100 %] 100 % (06/29 1344) Weight:  [48.535 kg (107 lb)] 48.535 kg (107 lb) (06/29 0552) Blood pressure 133/41, pulse 72, temperature 98.9 F (37.2 C), temperature source Tympanic, resp. rate 18, height 5\' 2"  (1.575 m), weight 48.535 kg (107 lb), SpO2 100 %.   Intake/Output from previous day: 06/28 0701 - 06/29 0700 In: 3588 [P.O.:1720; I.V.:1868] Out: -   Intake/Output this shift:     General appearance:  A 82 year old female no acute distress Resp:  Bilaterally clear to auscultation Cardio:  Regular rate and rhythm without rub or gallop GI:  Soft nontender nondistended bowel sounds positive normoactive Extremities:  No clubbing cyanosis or edema   Lab Results: Results for orders placed or performed during the hospital encounter of 08/17/15 (from the past 24 hour(s))  CBC     Status: Abnormal   Collection Time: 08/20/15  4:49 AM  Result Value Ref Range   WBC 16.2 (H) 3.6 - 11.0 K/uL   RBC 2.73 (L) 3.80 - 5.20 MIL/uL   Hemoglobin 8.4 (L) 12.0 - 16.0 g/dL   HCT 24.9 (L) 35.0 - 47.0 %   MCV 91.2 80.0 - 100.0 fL   MCH 30.9 26.0 - 34.0 pg   MCHC 33.8 32.0 - 36.0 g/dL   RDW 15.8 (H) 11.5 - 14.5 %   Platelets 252 150 - 440 K/uL  Basic metabolic panel     Status: Abnormal   Collection Time: 08/20/15  4:49 AM  Result Value Ref Range   Sodium 137 135 - 145 mmol/L   Potassium 3.2 (L) 3.5 - 5.1 mmol/L   Chloride 107 101 - 111 mmol/L   CO2 22 22 - 32 mmol/L   Glucose, Bld  108 (H) 65 - 99 mg/dL   BUN 19 6 - 20 mg/dL   Creatinine, Ser 1.03 (H) 0.44 - 1.00 mg/dL   Calcium 8.4 (L) 8.9 - 10.3 mg/dL   GFR calc non Af Amer 46 (L) >60 mL/min   GFR calc Af Amer 54 (L) >60 mL/min   Anion gap 8 5 - 15  CBC with Differential/Platelet     Status: Abnormal   Collection Time: 08/20/15  1:24 PM  Result Value Ref Range   WBC 14.1 (H) 3.6 - 11.0 K/uL   RBC 2.74 (L) 3.80 - 5.20 MIL/uL   Hemoglobin 8.5 (L) 12.0 - 16.0 g/dL   HCT 25.0 (L) 35.0 - 47.0 %   MCV 91.2 80.0 - 100.0 fL   MCH 31.0 26.0 - 34.0 pg   MCHC 34.0 32.0 - 36.0 g/dL   RDW 16.0 (H) 11.5 - 14.5 %   Platelets 243 150 - 440 K/uL   Neutrophils Relative % 88 %   Neutro Abs 12.3 (H) 1.4 - 6.5 K/uL   Lymphocytes Relative 8 %   Lymphs Abs 1.2 1.0 - 3.6 K/uL   Monocytes Relative 4 %   Monocytes Absolute 0.6 0.2 - 0.9 K/uL   Eosinophils Relative 0 %   Eosinophils Absolute  0.0 0 - 0.7 K/uL   Basophils Relative 0 %   Basophils Absolute 0.1 0 - 0.1 K/uL      Recent Labs  08/19/15 0559 08/20/15 0449 08/20/15 1324  WBC 7.5 16.2* 14.1*  HGB 10.4* 8.4* 8.5*  HCT 31.2* 24.9* 25.0*  PLT 373 252 243   BMET  Recent Labs  08/17/15 1805 08/18/15 1413 08/20/15 0449  NA 138 138 137  K 4.1 3.6 3.2*  CL 103 106 107  CO2 28 25 22   GLUCOSE 104* 139* 108*  BUN 36* 21* 19  CREATININE 1.06* 0.97 1.03*  CALCIUM 9.8 9.1 8.4*   LFT  Recent Labs  08/17/15 1805  PROT 6.8  ALBUMIN 3.5  AST 22  ALT 28  ALKPHOS 261*  BILITOT 0.2*   PT/INR No results for input(s): LABPROT, INR in the last 72 hours. Hepatitis Panel No results for input(s): HEPBSAG, HCVAB, HEPAIGM, HEPBIGM in the last 72 hours. C-Diff No results for input(s): CDIFFTOX in the last 72 hours. No results for input(s): CDIFFPCR in the last 72 hours.   Studies/Results: No results found.  Scheduled Inpatient Medications:   . calcium-vitamin D  1 tablet Oral BID  . docusate sodium  100 mg Oral BID  . hydrochlorothiazide  25 mg Oral Daily   . metoprolol  100 mg Oral BID  . pantoprazole  40 mg Oral BID  . potassium chloride  20 mEq Oral Once  . sodium chloride flush  3 mL Intravenous Q12H    Continuous Inpatient Infusions:   . sodium chloride 60 mL/hr at 08/20/15 0126    PRN Inpatient Medications:  acetaminophen **OR** acetaminophen, ondansetron **OR** ondansetron (ZOFRAN) IV  Miscellaneous:   Assessment:  1. Melena in the setting of known history of NSAID use. Hemodynamically stable no melena since admission. 2. Patient with elevated white count this morning. This was rechecked and confirmed. Patient has no fever cough or dysuria. Case discussed with internal medicine and anesthesia.  Plan:  1. EGD. Continue plans with procedure for this afternoon. Internal medicine to do further evaluation in regards to the white count. I have discussed the risks benefits and complications of procedures to include not limited to bleeding, infection, perforation and the risk of sedation and the patient wishes to proceed.  Lollie Sails MD 08/20/2015, 2:12 PM

## 2015-08-21 ENCOUNTER — Encounter: Payer: Self-pay | Admitting: Gastroenterology

## 2015-08-21 LAB — CBC
HCT: 23.9 % — ABNORMAL LOW (ref 35.0–47.0)
Hemoglobin: 8.3 g/dL — ABNORMAL LOW (ref 12.0–16.0)
MCH: 32 pg (ref 26.0–34.0)
MCHC: 34.5 g/dL (ref 32.0–36.0)
MCV: 92.5 fL (ref 80.0–100.0)
PLATELETS: 216 10*3/uL (ref 150–440)
RBC: 2.58 MIL/uL — AB (ref 3.80–5.20)
RDW: 16.5 % — AB (ref 11.5–14.5)
WBC: 9.3 10*3/uL (ref 3.6–11.0)

## 2015-08-21 MED ORDER — POTASSIUM CHLORIDE ER 10 MEQ PO TBCR: 10.0000 meq | EXTENDED_RELEASE_TABLET | Freq: Every day | ORAL | Status: DC

## 2015-08-21 MED ORDER — SUCRALFATE 1 GM/10ML PO SUSP: 1.0000 g | Freq: Three times a day (TID) | ORAL | Status: DC

## 2015-08-21 NOTE — Progress Notes (Signed)
Patient discharged to home, discharge instructions given as ordered. Follow up appoints given as ordered. Dr. Ola Spurr office to call patient with follow up appointment. Patient is alert and oriented, no acute distress noted.

## 2015-08-21 NOTE — Discharge Summary (Signed)
Sheryl Suarez, is a 82 y.o. female  DOB Jan 31, 1924  MRN [id number].  Admission date:  08/17/2015  Admitting Physician  Harrie Foreman, MD  Discharge Date:  08/21/2015   Primary MD  Leonel Ramsay, MD  Recommendations for primary care physician for things to follow:  Follow-up with primary doctor in one week Follow up with the GI Dr.  Gustavo Lah  In  1 month   Admission Diagnosis  Melena [K92.1] Gastrointestinal hemorrhage with melena [K92.1] Anemia, unspecified anemia type [D64.9]   Discharge Diagnosis  Melena [K92.1] Gastrointestinal hemorrhage with melena [K92.1] Anemia, unspecified anemia type [D64.9]    Active Problems:   GI bleed      Past Medical History  Diagnosis Date  . Hypertension   . Breast cancer ([facility])     remission    Past Surgical History  Procedure Laterality Date  . Appendectomy    . Cholecystectomy    . Abdominal hysterectomy    . Mastectomy Bilateral   . Breast implant exchange    . Esophagogastroduodenoscopy (egd) with propofol N/A 08/20/2015    Procedure: ESOPHAGOGASTRODUODENOSCOPY (EGD) WITH PROPOFOL;  Surgeon: Lollie Sails, MD;  Location: [facility] ENDOSCOPY;  Service: Endoscopy;  Laterality: N/A;       History of present illness and  Hospital Course:     Kindly see H&P for history of present illness and admission details, please review complete Labs, Consult reports and Test reports for all details in brief  HPI  from the history and physical done on the day of admission 82 year old female patient admitted because of black stools, anemia. Patient did not have any abdominal pain or nausea and vomiting. Had black stools for 4 days before admission. Myoglobin 13 on admission dropped to 9.4.   Hospital Course melena with acute symptomatic anemia: Patient seen by  gastroenterology, started on PPIs. Was taken to EGD, EGD showed gastritis, will gastritis, nonbleeding gastric ulcer. She recommended sucralfate 1 g by mouth 4 times a day for one month, Protonix 40 mg by mouth twice a day. And the patient will continue full liquid diet for 3 days and then advance to low residual diet for 1 week. I advised the patient not to take aspirin, ibuprofen, naproxen. Patient was taking aspirin for history of prior CABG before. So patient the need to stop the aspirin for 1 month and see gastroenterology as an outpatient at that time the decision can be made to restart aspirin or not. hemoGlobin is stable at 8.3, hematocrit 23.9. He did not require transfusion. Patient feels better. #2 leukocytosis likely reactive: Urine analysis did not show UTI, chest x-ray is negative for pneumonia. #3 .hypokalemia ; replaced.   Discharge Condition: stable   Follow UP  Follow-up Information    Follow up with FITZGERALD, DAVID P, MD In 2 weeks.   Specialty:  Infectious Diseases   Why:  Dr Tyler Pita office will call patient to inform her of the appointment.   Contact information:   [place] [address] [phone]       Follow up with Lollie Sails, MD. Go on 08/28/2015.   Specialty:  Gastroenterology   Why:  @ 11:15am with Ronney Asters, PA   Contact information:   [place] [facility] [place] [address] [phone]         Discharge Instructions  and  Discharge Medications     Discharge Instructions    Activity as tolerated - No restrictions  Complete by:  As directed      Diet - low sodium heart healthy    Complete by:  As directed             Medication List    STOP taking these medications        aspirin EC 81 MG tablet      TAKE these medications        CALCIUM 600+D 600-400 MG-UNIT tablet  Generic drug:  Calcium Carbonate-Vitamin D  Take 1 tablet by mouth 2 (two) times daily.      hydrochlorothiazide 25 MG tablet  Commonly known as:  HYDRODIURIL  Take 25 mg by mouth daily.     metoprolol 100 MG tablet  Commonly known as:  LOPRESSOR  Take 100 mg by mouth 2 (two) times daily.     pantoprazole 40 MG tablet  Commonly known as:  PROTONIX  Take 1 tablet (40 mg total) by mouth 2 (two) times daily.     sucralfate 1 GM/10ML suspension  Commonly known as:  CARAFATE  Take 10 mLs (1 g total) by mouth 4 (four) times daily -  with meals and at bedtime.          Diet and Activity recommendation: See Discharge Instructions above   Consults obtained -GI   Major procedures and Radiology Reports - PLEASE review detailed and final reports for all details, in brief -  EGD   US Abdomen Limited Ruq  08/18/2015  CLINICAL DATA:  82 year old female with history of cholecystectomy and dilated common bile duct with choledocholithiasis. EXAM: US ABDOMEN LIMITED - RIGHT UPPER QUADRANT COMPARISON:  04/27/2015 abdominal sonogram. FINDINGS: Gallbladder: Surgically absent. Common bile duct: Diameter: 20 mm, unchanged. Re- demonstrated is a calcified shadowing stone in the lower third of the common bile duct measuring 2.2 cm, not appreciably changed. Liver: Diffuse intrahepatic biliary ductal dilatation appears unchanged. Liver parenchymal echogenicity and echotexture are normal. No liver mass. No perihepatic ascites. IMPRESSION: 1. Cholecystectomy. 2. Stable intrahepatic biliary ductal dilatation and marked common bile duct dilatation (CBD diameter 20 mm) with 2.2 cm choledocholith in the lower third of the common bile duct. 3. Otherwise normal liver. Electronically Signed   By: Ilona Sorrel M.D.   On: 08/18/2015 12:20    Micro Results    No results found for this or any previous visit (from the past 240 hour(s)).     Today   Subjective:   Sheryl Suarez today has no headache,no chest abdominal pain,no new weakness tingling or numbness, feels much better wants to go home today.    Objective:   Blood pressure 131/81, pulse 108, temperature 98 F (36.7 C), temperature source Oral, resp. rate 16, height 5\' 2"  (1.575 m), weight 48.943 kg (107 lb 14.4 oz), SpO2 98 %.   Intake/Output Summary (Last 24 hours) at 08/21/15 1224 Last data filed at 08/21/15 0900  Gross per 24 hour  Intake   2953 ml  Output    550 ml  Net   2403 ml    Exam Awake Alert, Oriented x 3, No new F.N deficits, Normal affect [place].AT,PERRAL Supple Neck,No JVD, No cervical lymphadenopathy appriciated.  Symmetrical Chest wall movement, Good air movement bilaterally, CTAB RRR,No Gallops,Rubs or new Murmurs, No Parasternal Heave +ve B.Sounds, Abd Soft, Non tender, No organomegaly appriciated, No rebound -guarding or rigidity. No Cyanosis, Clubbing or edema, No new Rash or bruise  Data Review   CBC w Diff: Lab Results  Component Value Date   WBC  9.3 08/21/2015   HGB 8.3* 08/21/2015   HCT 23.9* 08/21/2015   PLT 216 08/21/2015   LYMPHOPCT 8 08/20/2015   MONOPCT 4 08/20/2015   EOSPCT 0 08/20/2015   BASOPCT 0 08/20/2015    CMP: Lab Results  Component Value Date   NA 137 08/20/2015   K 3.2* 08/20/2015   CL 107 08/20/2015   CO2 22 08/20/2015   BUN 19 08/20/2015   CREATININE 1.03* 08/20/2015   PROT 6.8 08/17/2015   ALBUMIN 3.5 08/17/2015   BILITOT 0.2* 08/17/2015   ALKPHOS 261* 08/17/2015   AST 22 08/17/2015   ALT 28 08/17/2015  .   Total Time in preparing paper work, data evaluation and todays exam - 34 minutes  Gerod Caligiuri M.D on 08/21/2015 at 12:24 PM    Note: This dictation was prepared with Dragon dictation along with smaller phrase technology. Any transcriptional errors that result from this process are unintentional.

## 2015-08-21 NOTE — Discharge Instructions (Signed)
Do not take ASA for one  Month Full liquid diet till Sunday And low residue diet from Monday Avoid NSAIDS

## 2015-08-24 LAB — H PYLORI, IGM, IGG, IGA AB: H. Pylogi, Iga Abs: <9 units (ref 0.0–8.9)

## 2018-11-22 ENCOUNTER — Ambulatory Visit: Payer: Medicare Other | Attending: Internal Medicine

## 2018-11-22 ENCOUNTER — Other Ambulatory Visit: Payer: Self-pay

## 2018-11-22 DIAGNOSIS — M6281 Muscle weakness (generalized): Secondary | ICD-10-CM | POA: Diagnosis present

## 2018-11-22 DIAGNOSIS — Z9181 History of falling: Secondary | ICD-10-CM | POA: Diagnosis present

## 2018-11-22 NOTE — Therapy (Deleted)
[facility] [address], [place], [address] Phone: [phone]   Fax:  [phone]  Physical Therapy Evaluation  Patient Details  Name: Sheryl Suarez MRN: [id number] Date of Birth: [birth year] No data recorded  Encounter Date: 11/22/2018  PT End of Session - 11/22/18 1850    Visit Number  1    Number of Visits  16    Date for PT Re-Evaluation  01/17/19    PT Start Time  [id number]    PT Stop Time  1615    PT Time Calculation (min)  60 min    Equipment Utilized During Treatment  Gait belt    Activity Tolerance  Patient tolerated treatment well;Patient limited by fatigue    Behavior During Therapy  WFL for tasks assessed/performed       Past Medical History:  Diagnosis Date  . Breast cancer ([place])    remission  . Hypertension     Past Surgical History:  Procedure Laterality Date  . ABDOMINAL HYSTERECTOMY    . APPENDECTOMY    . BREAST IMPLANT EXCHANGE    . CHOLECYSTECTOMY    . ESOPHAGOGASTRODUODENOSCOPY (EGD) WITH PROPOFOL N/A 08/20/2015   Procedure: ESOPHAGOGASTRODUODENOSCOPY (EGD) WITH PROPOFOL;  Surgeon: Lollie Sails, MD;  Location[facility] ENDOSCOPY;  Service: Endoscopy;  Laterality: N/A;  . MASTECTOMY Bilateral     There were no vitals filed for this visit.   Subjective Assessment - 11/22/18 1526    Subjective  Patient reports she started having freuqnet falls and states for the past 3 months her legs have been feeling increased weakness. Patient states she has fallen in the house when walking, standing for prolonged periods of time, and performing transfers from sitting to standing. Patient states she has increased swelling in both her legs starting 3 months prior. Patient states that this, "came out of the blue" and has not has any mechanism of injury. Patient states she ambulates with a  SPC and falls ~ 2xweek and feels fortunate that she has not experienced any fractures. Patient states no  improvement and only worsening of symptoms with performance. Patient states she would like to improve her strength and build up musculature.    Pertinent History  MI x 2, COPD, HTN    Limitations  Lifting;Standing;Walking    Patient Stated Goals  TO improve strength    Currently in Pain?  No/denies       Objective measurements completed on examination: See above findings.    TREATMENT Therapeutic Activities Sit to stands -- 3 x 5  Tandem stance -- x 30  Patient demonstrates difficulty with exercise    PT Education - 11/22/18 1850    Education Details  form/technique with exercise; sit to stands    Person(s) Educated  Patient    Methods  Explanation;Demonstration    Comprehension  Verbalized understanding;Returned demonstration          PT Long Term Goals - 11/22/18 1857      PT LONG TERM GOAL #1   Title  Patient will be independent with HEP to continue benefits of therapy until after DC.    Baseline  Dependent for form and technique    Time  6    Period  Weeks    Status  New    Target Date  01/17/19      PT LONG TERM GOAL #2   Title  Patient will improve TUG to under 12 second to improve dynamic balance  and decrease fall risk.    Baseline  25 sec with use of rollator    Time  6    Period  Weeks    Status  New    Target Date  01/17/19      PT LONG TERM GOAL #3   Title  Patient will improve 5xSTS to under 12 second from standard surface to improve functional strength and decrease fall risk.    Baseline  25 sec from 21" surface    Time  6    Period  Weeks    Status  New    Target Date  01/17/19      PT LONG TERM GOAL #4   Title  Patient will improve 83mwt to over .43m/s to improve dynamic balance and decrease fall risk.    Baseline  .4 m/s    Time  6    Period  Weeks    Status  New    Target Date  01/17/19             Plan - 11/22/18 1851    Clinical Impression Statement  Patient is a pleasant 85 yo female presenting with B LE weakness, poor  balance, and high fall risk. Patient's increased fall risk is indicated by poor scores with 12mwt, TUG, 5xSTS, and inability to perform sharpened tandem stance. Patient also demonstrates 4+ throughout lower legs B with significant DOE (desaturation of 90% with 91mWT) which may indicate poor venous return. Patient also demonstrates poor LE strength as indicated by MMT and will benefit from further skilled therapy to return to prior level of function.    Personal Factors and Comorbidities  Age;Comorbidity 3+    Comorbidities  DOE, MIx2, COPD    Examination-Activity Limitations  Bathing;Squat;Lift;Bend;Stand;Carry    Examination-Participation Restrictions  Laundry;Shop;Meal Prep;Yard Work;Cleaning    Stability/Clinical Decision Making  Unstable/Unpredictable    Rehab Potential  Fair    PT Frequency  2x / week    PT Duration  6 weeks    PT Treatment/Interventions  Therapeutic activities;Therapeutic exercise;Cryotherapy;Moist Heat;Stair training;Gait training;Balance training;Neuromuscular re-education;Patient/family education;Passive range of motion;Joint Manipulations;Manual techniques    PT Next Visit Plan  progress strengthening techniques    PT Home Exercise Plan  See education section       Patient will benefit from skilled therapeutic intervention in order to improve the following deficits and impairments:  Abnormal gait, Decreased balance, Decreased endurance, Decreased mobility, Difficulty walking, Increased muscle spasms, Decreased range of motion, Decreased activity tolerance, Decreased coordination, Decreased strength, Hypermobility, Pain, Postural dysfunction  Visit Diagnosis: Muscle weakness (generalized)  History of falling     Problem List Patient Active Problem List   Diagnosis Date Noted  . GI bleed 08/18/2015    Blythe Stanford, PT DPT 11/22/2018, 7:03 PM  [facility] [address], [place],  [address] Phone: [phone]   Fax:  [phone]  Name: Sheryl Suarez MRN: [id number] Date of Birth: Nov 26, 1923

## 2018-11-22 NOTE — Therapy (Signed)
[place] [facility] [address] [place], [address] Phone: [phone]   Fax:  [phone]  Physical Therapy Evaluation  Patient Details  Name: Sheryl Suarez MRN: [id number] Date of Birth: 17-Jul-1923 Referring Provider (PT): Edwina Barth MD   Encounter Date: 11/22/2018  PT End of Session - 11/22/18 1850    Visit Number  1    Number of Visits  16    Date for PT Re-Evaluation  01/17/19    PT Start Time  [id number]    PT Stop Time  1615    PT Time Calculation (min)  60 min    Equipment Utilized During Treatment  Gait belt    Activity Tolerance  Patient tolerated treatment well;Patient limited by fatigue    Behavior During Therapy  [facility] for tasks assessed/performed       Past Medical History:  Diagnosis Date  . Breast cancer ([place])    remission  . Hypertension     Past Surgical History:  Procedure Laterality Date  . ABDOMINAL HYSTERECTOMY    . APPENDECTOMY    . BREAST IMPLANT EXCHANGE    . CHOLECYSTECTOMY    . ESOPHAGOGASTRODUODENOSCOPY (EGD) WITH PROPOFOL N/A 08/20/2015   Procedure: ESOPHAGOGASTRODUODENOSCOPY (EGD) WITH PROPOFOL;  Surgeon: Lollie Sails, MD;  Location: [facility] ENDOSCOPY;  Service: Endoscopy;  Laterality: N/A;  . MASTECTOMY Bilateral     There were no vitals filed for this visit.   Subjective Assessment - 11/22/18 1526    Subjective  Patient reports she started having freuqnet falls and states for the past 3 months her legs have been feeling increased weakness. Patient states she has fallen in the house when walking, standing for prolonged periods of time, and performing transfers from sitting to standing. Patient states she has increased swelling in both her legs starting 3 months prior. Patient states that this, "came out of the blue" and has not has any mechanism of injury. Patient states she ambulates with a  SPC and falls ~ 2xweek and feels fortunate that she has not experienced any fractures.  Patient states no improvement and only worsening of symptoms with performance. Patient states she would like to improve her strength and build up musculature.    Pertinent History  MI x 2, COPD, HTN    Limitations  Lifting;Standing;Walking    Patient Stated Goals  TO improve strength    Currently in Pain?  No/denies         [facility] PT Assessment - 11/22/18 1906      Assessment   Medical Diagnosis  generalized weakness, falls    Referring Provider (PT)  Edwina Barth MD    Onset Date/Surgical Date  08/22/18    Hand Dominance  Right    Next MD Visit  unknown      Balance Screen   Has the patient fallen in the past 6 months  Yes    How many times?  12    Has the patient had a decrease in activity level because of a fear of falling?   Yes    Is the patient reluctant to leave their home because of a fear of falling?   No      Home Environment   Living Environment  Private residence    Living Arrangements  Alone    Available Help at Discharge  Family    Type of [place] to enter    Entrance [place] of  Steps  4    Entrance Stairs-Rails  Can reach both    Home Layout  One level    Home Equipment  --   ROllator, SPC     Prior Function   Level of Independence  Independent    Vocation  Retired    Biomedical scientist  N/A    Leisure  Visit family      Cognition   Overall Cognitive Status  Within Functional Limits for tasks assessed      Observation/Other Assessments   Observations  4+ pitting edema B, DOE after amb 40 ft with rollator      Functional Tests   Functional tests  Sit to Stand      Sit to Stand   Comments  Unable to perform from standard height      ROM / Strength   AROM / PROM / Strength  AROM;Strength      AROM   AROM Assessment Site  Hip;Knee;Ankle    Right/Left Hip  Right;Left    Right Hip Flexion  90    Right Hip External Rotation   5    Right Hip ABduction  20   in sitting   Left Hip Flexion  90    Left Hip External  Rotation   5    Left Hip ABduction  20    Right/Left Knee  Left;Right    Right Knee Extension  0    Right Knee Flexion  90    Left Knee Extension  0    Left Knee Flexion  90    Right/Left Ankle  Right;Left    Right Ankle Dorsiflexion  --   -10   Right Ankle Plantar Flexion  40    Left Ankle Dorsiflexion  -10    Left Ankle Plantar Flexion  40      Strength   Strength Assessment Site  Hip;Ankle;Knee    Right/Left Hip  Right;Left    Right Hip Flexion  3+/5    Right Hip External Rotation   2+/5    Right Hip ABduction  2+/5    Right Hip ADduction  2+/5    Left Hip Flexion  3+/5    Left Hip External Rotation  2+/5    Left Hip ABduction  2+/5    Left Hip ADduction  2+/5    Right/Left Knee  Right;Left    Right Knee Flexion  3/5    Right Knee Extension  4+/5    Left Knee Flexion  3/5    Left Knee Extension  4+/5      Palpation   Palpation comment  No TTP anywher examined, including along the gastroc muscle belly B      Ambulation/Gait   Gait Comments  Decreased step length, decreased dorsiflexion B with amb (with use of SPC), decreased gait speed      Standardized Balance Assessment   Standardized Balance Assessment  Timed Up and Go Test;10 meter walk test;Five Times Sit to Stand    Five times sit to stand comments   25sec from 21" chair    10 Meter Walk  .81m/s      Timed Up and Go Test   Normal TUG (seconds)  25   with rollator     High Level Balance   High Level Balance Comments  Tandem Stance:  unable to acheive B; semi tandem <3 B        Objective measurements completed on examination: See above findings.    TREATMENT  Therapeutic Activities Sit to stands -- 3 x 5  Tandem stance -- x 30  Patient demonstrates difficulty with exercise   Objective measurements completed on examination: See above findings.              PT Education - 11/22/18 1850    Education Details  form/technique with exercise; sit to stands    Person(s) Educated  Patient     Methods  Explanation;Demonstration    Comprehension  Verbalized understanding;Returned demonstration          PT Long Term Goals - 11/22/18 1857      PT LONG TERM GOAL #1   Title  Patient will be independent with HEP to continue benefits of therapy until after DC.    Baseline  Dependent for form and technique    Time  6    Period  Weeks    Status  New    Target Date  01/17/19      PT LONG TERM GOAL #2   Title  Patient will improve TUG to under 12 second to improve dynamic balance and decrease fall risk.    Baseline  25 sec with use of rollator    Time  6    Period  Weeks    Status  New    Target Date  01/17/19      PT LONG TERM GOAL #3   Title  Patient will improve 5xSTS to under 12 second from standard surface to improve functional strength and decrease fall risk.    Baseline  25 sec from 21" surface    Time  6    Period  Weeks    Status  New    Target Date  01/17/19      PT LONG TERM GOAL #4   Title  Patient will improve 21mwt to over .33m/s to improve dynamic balance and decrease fall risk.    Baseline  .4 m/s    Time  6    Period  Weeks    Status  New    Target Date  01/17/19             Plan - 11/22/18 1851    Clinical Impression Statement  Patient is a pleasant 85 yo female presenting with B LE weakness, poor balance, and high fall risk. Patient's increased fall risk is indicated by poor scores with 62mwt, TUG, 5xSTS, and inability to perform sharpened tandem stance. Patient also demonstrates 4+ throughout lower legs B with significant DOE (desaturation of 90% with 44mWT) which may indicate poor venous return. Patient also demonstrates poor LE strength as indicated by MMT and will benefit from further skilled therapy to return to prior level of function.    Personal Factors and Comorbidities  Age;Comorbidity 3+    Comorbidities  DOE, MIx2, COPD    Examination-Activity Limitations  Bathing;Squat;Lift;Bend;Stand;Carry    Examination-Participation Restrictions   Laundry;Shop;Meal Prep;Yard Work;Cleaning    Stability/Clinical Decision Making  Unstable/Unpredictable    Rehab Potential  Fair    PT Frequency  2x / week    PT Duration  6 weeks    PT Treatment/Interventions  Therapeutic activities;Therapeutic exercise;Cryotherapy;Moist Heat;Stair training;Gait training;Balance training;Neuromuscular re-education;Patient/family education;Passive range of motion;Joint Manipulations;Manual techniques    PT Next Visit Plan  progress strengthening techniques    PT Home Exercise Plan  See education section       Patient will benefit from skilled therapeutic intervention in order to improve the following deficits and impairments:  Abnormal gait, Decreased balance,  Decreased endurance, Decreased mobility, Difficulty walking, Increased muscle spasms, Decreased range of motion, Decreased activity tolerance, Decreased coordination, Decreased strength, Hypermobility, Pain, Postural dysfunction  Visit Diagnosis: Muscle weakness (generalized) - Plan: PT plan of care cert/re-cert  History of falling - Plan: PT plan of care cert/re-cert     Problem List Patient Active Problem List   Diagnosis Date Noted  . GI bleed 08/18/2015    Blythe Stanford, PT DPT 11/22/2018, 7:17 PM  [place] [facility] [address], [place], [address] Phone: [phone]   Fax:  [phone]  Name: Sheryl Suarez MRN: [id number] Date of Birth: 1923-10-08

## 2018-11-27 ENCOUNTER — Other Ambulatory Visit: Payer: Self-pay

## 2018-11-27 ENCOUNTER — Ambulatory Visit: Payer: Medicare Other

## 2018-11-27 DIAGNOSIS — M6281 Muscle weakness (generalized): Secondary | ICD-10-CM | POA: Diagnosis not present

## 2018-11-27 DIAGNOSIS — Z9181 History of falling: Secondary | ICD-10-CM

## 2018-11-28 NOTE — Therapy (Signed)
[facility] [address] [place], [address] Phone: [phone]   Fax:  [phone]  Physical Therapy Treatment  Patient Details  Name: Sheryl Suarez MRN: [id number] Date of Birth: Jan 22, 1924 Referring Provider (PT): Edwina Barth MD   Encounter Date: 11/27/2018  PT End of Session - 11/28/18 0917    Visit Number  2    Number of Visits  16    Date for PT Re-Evaluation  01/17/19    PT Start Time  1430    PT Stop Time  1515    PT Time Calculation (min)  45 min    Equipment Utilized During Treatment  Gait belt    Activity Tolerance  Patient tolerated treatment well;Patient limited by fatigue    Behavior During Therapy  [facility] for tasks assessed/performed       Past Medical History:  Diagnosis Date  . Breast cancer ([place])    remission  . Hypertension     Past Surgical History:  Procedure Laterality Date  . ABDOMINAL HYSTERECTOMY    . APPENDECTOMY    . BREAST IMPLANT EXCHANGE    . CHOLECYSTECTOMY    . ESOPHAGOGASTRODUODENOSCOPY (EGD) WITH PROPOFOL N/A 08/20/2015   Procedure: ESOPHAGOGASTRODUODENOSCOPY (EGD) WITH PROPOFOL;  Surgeon: Lollie Sails, MD;  Location[facility] ENDOSCOPY;  Service: Endoscopy;  Laterality: N/A;  . MASTECTOMY Bilateral     There were no vitals filed for this visit.  Subjective Assessment - 11/28/18 0915    Subjective  Patient states she had not been taking her "fluid pill" appropriately and had only been taking it QD and not BID.    Pertinent History  MI x 2, COPD, HTN    Limitations  Lifting;Standing;Walking    Patient Stated Goals  TO improve strength    Currently in Pain?  No/denies       TREATMENT Therapetutic Exercise  Sit to stands - 2 x 10 without UE support Amb 357ft with rollator with monitoring SpO2 90< % during entire session Side stepping - 3ft x 6 with therapist support Step overs with hurdles 6" -  (2 cones) performed 4 times - 2 sets Stairs with B UE support - x  2 with 4 steps Standing feet together without UE support on airex pad - x 15 Marches with intermittent UE support while on airex pad - x 15 Amb with SPC - x 155ft Heel raises on airex pad with intermittent UE support - x 20   Performed exercises to address balance concerns and improve LE strength   PT Education - 11/28/18 0916    Education Details  form/technique with exercise    Person(s) Educated  Patient    Methods  Explanation;Demonstration    Comprehension  Verbalized understanding;Returned demonstration          PT Long Term Goals - 11/22/18 1857      PT LONG TERM GOAL #1   Title  Patient will be independent with HEP to continue benefits of therapy until after DC.    Baseline  Dependent for form and technique    Time  6    Period  Weeks    Status  New    Target Date  01/17/19      PT LONG TERM GOAL #2   Title  Patient will improve TUG to under 12 second to improve dynamic balance and decrease fall risk.    Baseline  25 sec with use of rollator    Time  6    Period  Weeks    Status  New    Target Date  01/17/19      PT LONG TERM GOAL #3   Title  Patient will improve 5xSTS to under 12 second from standard surface to improve functional strength and decrease fall risk.    Baseline  25 sec from 21" surface    Time  6    Period  Weeks    Status  New    Target Date  01/17/19      PT LONG TERM GOAL #4   Title  Patient will improve 39mwt to over .70m/s to improve dynamic balance and decrease fall risk.    Baseline  .4 m/s    Time  6    Period  Weeks    Status  New    Target Date  01/17/19            Plan - 11/28/18 [id number]    Clinical Impression Statement  Tested walking function again today and patient was able to perform 282ft of walking until requiring a sitting rest break secondary to oxygen desaturation of SpO2 of 90%. Patient continues to have increased swelling along her LEs which is limiting her stepping ability significantly. Focused a lot of today's  session on using an AD when ambulating (Patient walked into the clinic without use of an AD). Reeducated patient on appropriateness of use of the rollator walker specifically as she requires CGA to minA to ambulate depending on challenge. Addressed LE weakness and improving cardiovascular endurance with exercises; patient requires frequent sitting rest breaks during the session secondary to breathlessness and fatigue. Will continue to address these limitations and patient will benefit from further skilled therapy to return to prior level of function.    Personal Factors and Comorbidities  Age;Comorbidity 3+    Comorbidities  DOE, MIx2, COPD    Examination-Activity Limitations  Bathing;Squat;Lift;Bend;Stand;Carry    Examination-Participation Restrictions  Laundry;Shop;Meal Prep;Yard Work;Cleaning    Stability/Clinical Decision Making  Unstable/Unpredictable    Rehab Potential  Fair    PT Frequency  2x / week    PT Duration  6 weeks    PT Treatment/Interventions  Therapeutic activities;Therapeutic exercise;Cryotherapy;Moist Heat;Stair training;Gait training;Balance training;Neuromuscular re-education;Patient/family education;Passive range of motion;Joint Manipulations;Manual techniques    PT Next Visit Plan  progress strengthening techniques    PT Home Exercise Plan  See education section       Patient will benefit from skilled therapeutic intervention in order to improve the following deficits and impairments:  Abnormal gait, Decreased balance, Decreased endurance, Decreased mobility, Difficulty walking, Increased muscle spasms, Decreased range of motion, Decreased activity tolerance, Decreased coordination, Decreased strength, Hypermobility, Pain, Postural dysfunction  Visit Diagnosis: History of falling  Muscle weakness (generalized)     Problem List Patient Active Problem List   Diagnosis Date Noted  . GI bleed 08/18/2015    Sheryl Suarez, PT DPT 11/28/2018, 9:24 AM  [place] [facility] [address], [place], [address] Phone: [phone]   Fax:  [phone]  Name: Sheryl Suarez MRN: [id number] Date of Birth: [birth year]

## 2018-11-29 ENCOUNTER — Ambulatory Visit: Payer: Medicare Other

## 2018-11-29 ENCOUNTER — Other Ambulatory Visit: Payer: Self-pay

## 2018-11-29 DIAGNOSIS — Z9181 History of falling: Secondary | ICD-10-CM

## 2018-11-29 DIAGNOSIS — M6281 Muscle weakness (generalized): Secondary | ICD-10-CM | POA: Diagnosis not present

## 2018-11-29 NOTE — Therapy (Signed)
[facility] [address] [place], [address] Phone: [phone]   Fax:  [phone]  Physical Therapy Treatment  Patient Details  Name: Sheryl Suarez MRN: [id number] Date of Birth: September 01, 1923 Referring Provider (PT): Edwina Barth MD   Encounter Date: 11/29/2018  PT End of Session - 11/29/18 1514    Visit Number  3    Number of Visits  16    Date for PT Re-Evaluation  01/17/19    PT Start Time  1430    PT Stop Time  1515    PT Time Calculation (min)  45 min    Equipment Utilized During Treatment  Gait belt    Activity Tolerance  Patient tolerated treatment well;Patient limited by fatigue    Behavior During Therapy  [facility] for tasks assessed/performed       Past Medical History:  Diagnosis Date  . Breast cancer ([place])    remission  . Hypertension     Past Surgical History:  Procedure Laterality Date  . ABDOMINAL HYSTERECTOMY    . APPENDECTOMY    . BREAST IMPLANT EXCHANGE    . CHOLECYSTECTOMY    . ESOPHAGOGASTRODUODENOSCOPY (EGD) WITH PROPOFOL N/A 08/20/2015   Procedure: ESOPHAGOGASTRODUODENOSCOPY (EGD) WITH PROPOFOL;  Surgeon: Lollie Sails, MD;  Location: [facility] ENDOSCOPY;  Service: Endoscopy;  Laterality: N/A;  . MASTECTOMY Bilateral     There were no vitals filed for this visit.  Subjective Assessment - 11/29/18 1507    Subjective  Patient states she has been taking her "fluid pill" and states she's able to tell a difference in the swelling in her legs.    Pertinent History  MI x 2, COPD, HTN    Limitations  Lifting;Standing;Walking    Patient Stated Goals  TO improve strength    Currently in Pain?  No/denies          TREATMENT Therapetutic Exercise  Sit to stands - 2 x 12 without UE support Amb 212ft, 119ft with CGA-MinA with monitoring SpO2 90< % during entire session Marches with intermittent UE support while on airex pad - 2x 20 Standing feet together without UE support on airex pad -  x 30sec Side stepping up and over airex pad in standing - x 20  Step over hurdles in standing 4 hurdles performed 8 times total - x 20  Hip abduction in standing - x 10 B   Performed exercises to address balance concerns and improve LE strength    PT Education - 11/29/18 1513    Education Details  form/technique with exercise    Person(s) Educated  Patient    Methods  Explanation;Demonstration    Comprehension  Verbalized understanding;Returned demonstration          PT Long Term Goals - 11/22/18 1857      PT LONG TERM GOAL #1   Title  Patient will be independent with HEP to continue benefits of therapy until after DC.    Baseline  Dependent for form and technique    Time  6    Period  Weeks    Status  New    Target Date  01/17/19      PT LONG TERM GOAL #2   Title  Patient will improve TUG to under 12 second to improve dynamic balance and decrease fall risk.    Baseline  25 sec with use of rollator    Time  6    Period  Weeks  Status  New    Target Date  01/17/19      PT LONG TERM GOAL #3   Title  Patient will improve 5xSTS to under 12 second from standard surface to improve functional strength and decrease fall risk.    Baseline  25 sec from 21" surface    Time  6    Period  Weeks    Status  New    Target Date  01/17/19      PT LONG TERM GOAL #4   Title  Patient will improve 8mwt to over .17m/s to improve dynamic balance and decrease fall risk.    Baseline  .4 m/s    Time  6    Period  Weeks    Status  New    Target Date  01/17/19            Plan - 11/29/18 1533    Clinical Impression Statement  Patient continues to require sititng rest breaks throughout the session secondary to fatigue and DOE. Patient demonstrates increased likihood of falling backwards with ambulation and standing. Patient is able to perform greater amount of marches before requiring therapist assistance to maintain balance. Patient is improving, however, continues to have these  limitations, and will benefit from further skilled therapy to return to prior level of function.    Personal Factors and Comorbidities  Age;Comorbidity 3+    Comorbidities  DOE, MIx2, COPD    Examination-Activity Limitations  Bathing;Squat;Lift;Bend;Stand;Carry    Examination-Participation Restrictions  Laundry;Shop;Meal Prep;Yard Work;Cleaning    Stability/Clinical Decision Making  Unstable/Unpredictable    Rehab Potential  Fair    PT Frequency  2x / week    PT Duration  6 weeks    PT Treatment/Interventions  Therapeutic activities;Therapeutic exercise;Cryotherapy;Moist Heat;Stair training;Gait training;Balance training;Neuromuscular re-education;Patient/family education;Passive range of motion;Joint Manipulations;Manual techniques    PT Next Visit Plan  progress strengthening techniques    PT Home Exercise Plan  See education section       Patient will benefit from skilled therapeutic intervention in order to improve the following deficits and impairments:  Abnormal gait, Decreased balance, Decreased endurance, Decreased mobility, Difficulty walking, Increased muscle spasms, Decreased range of motion, Decreased activity tolerance, Decreased coordination, Decreased strength, Hypermobility, Pain, Postural dysfunction  Visit Diagnosis: History of falling  Muscle weakness (generalized)     Problem List Patient Active Problem List   Diagnosis Date Noted  . GI bleed 08/18/2015    Blythe Stanford, PT DPT 11/29/2018, 3:53 PM  [place] [facility] [address] [place], [address] Phone: [phone]   Fax:  [phone]  Name: KASHAUNA SWANICK MRN: [id number] Date of Birth: 1923/06/19

## 2018-12-04 ENCOUNTER — Ambulatory Visit: Payer: Medicare Other

## 2018-12-04 ENCOUNTER — Other Ambulatory Visit: Payer: Self-pay

## 2018-12-04 DIAGNOSIS — Z9181 History of falling: Secondary | ICD-10-CM

## 2018-12-04 DIAGNOSIS — M6281 Muscle weakness (generalized): Secondary | ICD-10-CM | POA: Diagnosis not present

## 2018-12-04 NOTE — Therapy (Signed)
[facility] [address], [place], [address] Phone: [phone]   Fax:  [phone]  Physical Therapy Treatment  Patient Details  Name: Sheryl Suarez MRN: [id number] Date of Birth: 1923-05-22 Referring Provider (PT): Edwina Barth MD   Encounter Date: 12/04/2018  PT End of Session - 12/04/18 1405    Visit Number  4    Number of Visits  16    Date for PT Re-Evaluation  01/17/19    PT Start Time  [id number]    PT Stop Time  1430    PT Time Calculation (min)  45 min    Equipment Utilized During Treatment  Gait belt    Activity Tolerance  Patient tolerated treatment well;Patient limited by fatigue    Behavior During Therapy  [facility] for tasks assessed/performed       Past Medical History:  Diagnosis Date  . Breast cancer ([place])    remission  . Hypertension     Past Surgical History:  Procedure Laterality Date  . ABDOMINAL HYSTERECTOMY    . APPENDECTOMY    . BREAST IMPLANT EXCHANGE    . CHOLECYSTECTOMY    . ESOPHAGOGASTRODUODENOSCOPY (EGD) WITH PROPOFOL N/A 08/20/2015   Procedure: ESOPHAGOGASTRODUODENOSCOPY (EGD) WITH PROPOFOL;  Surgeon: Lollie Sails, MD;  Location: [facility] ENDOSCOPY;  Service: Endoscopy;  Laterality: N/A;  . MASTECTOMY Bilateral     There were no vitals filed for this visit.  Subjective Assessment - 12/04/18 1402    Subjective  Patient states she continues to take her diarehytic BID. Patient states her legs continue to swell and feel stiff.    Pertinent History  MI x 2, COPD, HTN    Limitations  Lifting;Standing;Walking    Patient Stated Goals  TO improve strength    Currently in Pain?  No/denies       TREATMENT Therapetutic Exercise  Sit to stands - 2 x 14 without UE support Amb 22ft, with CGA-MinA with monitoring SpO2 90< % during entire session Cone taps in standing - x 20  Hip abduction in standing - 2 x 10 B stepping up onto airex pad in standing - x 10    Performed exercises to  address balance concerns and improve LE strength  PT Education - 12/04/18 1404    Education Details  form/technique with exercise    Person(s) Educated  Patient    Methods  Explanation;Demonstration    Comprehension  Verbalized understanding;Returned demonstration          PT Long Term Goals - 11/22/18 1857      PT LONG TERM GOAL #1   Title  Patient will be independent with HEP to continue benefits of therapy until after DC.    Baseline  Dependent for form and technique    Time  6    Period  Weeks    Status  New    Target Date  01/17/19      PT LONG TERM GOAL #2   Title  Patient will improve TUG to under 12 second to improve dynamic balance and decrease fall risk.    Baseline  25 sec with use of rollator    Time  6    Period  Weeks    Status  New    Target Date  01/17/19      PT LONG TERM GOAL #3   Title  Patient will improve 5xSTS to under 12 second from standard surface to improve functional strength and decrease fall  risk.    Baseline  25 sec from 21" surface    Time  6    Period  Weeks    Status  New    Target Date  01/17/19      PT LONG TERM GOAL #4   Title  Patient will improve 74mwt to over .33m/s to improve dynamic balance and decrease fall risk.    Baseline  .4 m/s    Time  6    Period  Weeks    Status  New    Target Date  01/17/19            Plan - 12/04/18 1443    Clinical Impression Statement  Patient continues to require frequent sitting rest breaks during the entirity of the session. Patient demonstrates ability to perform more sit to stands compared to previous session with ability to perform 2 x 14 STS indicating functional carryover between sessions and improvement in LE strength. Although patient is improving, she continues to have increased difficulty with performing prolonged standing exercises. Patient is improving overall and patient will benefit from further skilled therapy to return to prior level of function.    Personal Factors and  Comorbidities  Age;Comorbidity 3+    Comorbidities  DOE, MIx2, COPD    Examination-Activity Limitations  Bathing;Squat;Lift;Bend;Stand;Carry    Examination-Participation Restrictions  Laundry;Shop;Meal Prep;Yard Work;Cleaning    Stability/Clinical Decision Making  Unstable/Unpredictable    Rehab Potential  Fair    PT Frequency  2x / week    PT Duration  6 weeks    PT Treatment/Interventions  Therapeutic activities;Therapeutic exercise;Cryotherapy;Moist Heat;Stair training;Gait training;Balance training;Neuromuscular re-education;Patient/family education;Passive range of motion;Joint Manipulations;Manual techniques    PT Next Visit Plan  progress strengthening techniques    PT Home Exercise Plan  See education section       Patient will benefit from skilled therapeutic intervention in order to improve the following deficits and impairments:  Abnormal gait, Decreased balance, Decreased endurance, Decreased mobility, Difficulty walking, Increased muscle spasms, Decreased range of motion, Decreased activity tolerance, Decreased coordination, Decreased strength, Hypermobility, Pain, Postural dysfunction  Visit Diagnosis: History of falling  Muscle weakness (generalized)     Problem List Patient Active Problem List   Diagnosis Date Noted  . GI bleed 08/18/2015    Blythe Stanford, PT DPT 12/04/2018, 2:48 PM  [facility] [address] [place], [address] Phone: [phone]   Fax:  [phone]  Name: Sheryl Suarez MRN: [id number] Date of Birth: Aug 23, 1923

## 2018-12-06 ENCOUNTER — Ambulatory Visit: Payer: Medicare Other

## 2018-12-06 ENCOUNTER — Other Ambulatory Visit: Payer: Self-pay

## 2018-12-06 DIAGNOSIS — M6281 Muscle weakness (generalized): Secondary | ICD-10-CM

## 2018-12-06 DIAGNOSIS — Z9181 History of falling: Secondary | ICD-10-CM

## 2018-12-06 NOTE — Therapy (Signed)
[facility] [address] [place], [address] Phone: [phone]   Fax:  [phone]  Physical Therapy Treatment  Patient Details  Name: Sheryl Suarez MRN: [id number] Date of Birth: 1923-12-23 Referring Provider (PT): Edwina Barth MD   Encounter Date: 12/06/2018  PT End of Session - 12/06/18 1444    Visit Number  5    Number of Visits  16    Date for PT Re-Evaluation  01/17/19    PT Start Time  1430    PT Stop Time  1515    PT Time Calculation (min)  45 min    Equipment Utilized During Treatment  Gait belt    Activity Tolerance  Patient tolerated treatment well;Patient limited by fatigue    Behavior During Therapy  [facility] for tasks assessed/performed       Past Medical History:  Diagnosis Date  . Breast cancer ([place])    remission  . Hypertension     Past Surgical History:  Procedure Laterality Date  . ABDOMINAL HYSTERECTOMY    . APPENDECTOMY    . BREAST IMPLANT EXCHANGE    . CHOLECYSTECTOMY    . ESOPHAGOGASTRODUODENOSCOPY (EGD) WITH PROPOFOL N/A 08/20/2015   Procedure: ESOPHAGOGASTRODUODENOSCOPY (EGD) WITH PROPOFOL;  Surgeon: Lollie Sails, MD;  Location: [facility] ENDOSCOPY;  Service: Endoscopy;  Laterality: N/A;  . MASTECTOMY Bilateral     There were no vitals filed for this visit.  Subjective Assessment - 12/06/18 1439    Subjective  Patient reports she has continued to take her medication and reports no major changes otherwise.    Pertinent History  MI x 2, COPD, HTN    Limitations  Lifting;Standing;Walking    Patient Stated Goals  TO improve strength    Currently in Pain?  No/denies       TREATMENT Therapetutic Exercise  Sit to stands - 2 x 10 with therapist support fast up, slow down Amb 35ft, with CGA-MinA with monitoring SpO2 93< % during entire session Step ups onto 6" step - 2 x 5 B with unilateral support  Stepping up onto airex pad in standing - x 10  Standing on airex pad -  3 x  30sec EC   Performed exercises to address balance concerns and improve LE strength   PT Education - 12/06/18 1444    Education Details  form/technique with exercise    Person(s) Educated  Patient    Methods  Explanation;Demonstration    Comprehension  Verbalized understanding;Returned demonstration          PT Long Term Goals - 11/22/18 1857      PT LONG TERM GOAL #1   Title  Patient will be independent with HEP to continue benefits of therapy until after DC.    Baseline  Dependent for form and technique    Time  6    Period  Weeks    Status  New    Target Date  01/17/19      PT LONG TERM GOAL #2   Title  Patient will improve TUG to under 12 second to improve dynamic balance and decrease fall risk.    Baseline  25 sec with use of rollator    Time  6    Period  Weeks    Status  New    Target Date  01/17/19      PT LONG TERM GOAL #3   Title  Patient will improve 5xSTS to under 12 second from standard  surface to improve functional strength and decrease fall risk.    Baseline  25 sec from 21" surface    Time  6    Period  Weeks    Status  New    Target Date  01/17/19      PT LONG TERM GOAL #4   Title  Patient will improve 70mwt to over .23m/s to improve dynamic balance and decrease fall risk.    Baseline  .4 m/s    Time  6    Period  Weeks    Status  New    Target Date  01/17/19            Plan - 12/06/18 1453    Clinical Impression Statement  Patient demonstrates improvement with performing standing for prolonged periods of time in terms of balance which she is able to perform with EC. Although patient is progressing, she continues to have difficulty with performing exercises in walking and moving indicating a decrease in dynamic balance. Patient will benefit from further skilled therapy to return to prior level of function.    Personal Factors and Comorbidities  Age;Comorbidity 3+    Comorbidities  DOE, MIx2, COPD    Examination-Activity Limitations   Bathing;Squat;Lift;Bend;Stand;Carry    Examination-Participation Restrictions  Laundry;Shop;Meal Prep;Yard Work;Cleaning    Stability/Clinical Decision Making  Unstable/Unpredictable    Rehab Potential  Fair    PT Frequency  2x / week    PT Duration  6 weeks    PT Treatment/Interventions  Therapeutic activities;Therapeutic exercise;Cryotherapy;Moist Heat;Stair training;Gait training;Balance training;Neuromuscular re-education;Patient/family education;Passive range of motion;Joint Manipulations;Manual techniques    PT Next Visit Plan  progress strengthening techniques    PT Home Exercise Plan  See education section       Patient will benefit from skilled therapeutic intervention in order to improve the following deficits and impairments:  Abnormal gait, Decreased balance, Decreased endurance, Decreased mobility, Difficulty walking, Increased muscle spasms, Decreased range of motion, Decreased activity tolerance, Decreased coordination, Decreased strength, Hypermobility, Pain, Postural dysfunction  Visit Diagnosis: Muscle weakness (generalized)  History of falling     Problem List Patient Active Problem List   Diagnosis Date Noted  . GI bleed 08/18/2015    Blythe Stanford, PT DPT 12/06/2018, 3:37 PM  [facility] [address] [place], [address] Phone: [phone]   Fax:  [phone]  Name: Sheryl Suarez MRN: [id number] Date of Birth: 12/06/1923

## 2018-12-11 ENCOUNTER — Other Ambulatory Visit: Payer: Self-pay

## 2018-12-11 ENCOUNTER — Ambulatory Visit: Payer: Medicare Other

## 2018-12-11 DIAGNOSIS — M6281 Muscle weakness (generalized): Secondary | ICD-10-CM

## 2018-12-11 DIAGNOSIS — Z9181 History of falling: Secondary | ICD-10-CM

## 2018-12-11 NOTE — Therapy (Signed)
[facility] [address], [place], [address] Phone: [phone]   Fax:  [phone]  Physical Therapy Treatment  Patient Details  Name: Sheryl Suarez MRN: [id number] Date of Birth: [birth year] Referring Provider (PT): Edwina Barth MD   Encounter Date: 12/11/2018  PT End of Session - 12/11/18 1555    Visit Number  6    Number of Visits  16    Date for PT Re-Evaluation  01/17/19    PT Start Time  1300    PT Stop Time  1345    PT Time Calculation (min)  45 min    Equipment Utilized During Treatment  Gait belt    Activity Tolerance  Patient tolerated treatment well;Patient limited by fatigue    Behavior During Therapy  [facility] for tasks assessed/performed       Past Medical History:  Diagnosis Date  . Breast cancer ([place])    remission  . Hypertension     Past Surgical History:  Procedure Laterality Date  . ABDOMINAL HYSTERECTOMY    . APPENDECTOMY    . BREAST IMPLANT EXCHANGE    . CHOLECYSTECTOMY    . ESOPHAGOGASTRODUODENOSCOPY (EGD) WITH PROPOFOL N/A 08/20/2015   Procedure: ESOPHAGOGASTRODUODENOSCOPY (EGD) WITH PROPOFOL;  Surgeon: Lollie Sails, MD;  Location: [facility] ENDOSCOPY;  Service: Endoscopy;  Laterality: N/A;  . MASTECTOMY Bilateral     There were no vitals filed for this visit.  Subjective Assessment - 12/11/18 1310    Subjective  Patient states she feels like the swelling in her legs are improving. Patient reports she feels like her legs continue to be weak.    Pertinent History  MI x 2, COPD, HTN    Limitations  Lifting;Standing;Walking    Patient Stated Goals  TO improve strength    Currently in Pain?  No/denies       TREATMENT  Therapeutic Exercise  Sit to stands - 2 x 10 with verbal cues for increased power during concentric, increased tempo during eccentric for loading glutes and safety  Amb 200 ft, with CGA for safety  Step ups onto 6" step - 2 x 10 with single UE  support  Stepping up onto airex pad in standing 2 x 10 CGA - verbal cues for increased step distance and placing back foot prior to wt transfer, improved with cues  Standing on airex pad -  1 x 20 sec EC  Modified tandem stance with wide steps R/L x 10 ea for improvement of stepping strategy     Performed exercises to address balance concerns and improve LE strength  SpO2 monitored and >93 throughout session   PT Education - 12/12/18 1343    Education Details  form/technique with exercise    Person(s) Educated  Patient    Methods  Explanation;Demonstration    Comprehension  Verbalized understanding;Returned demonstration          PT Long Term Goals - 11/22/18 1857      PT LONG TERM GOAL #1   Title  Patient will be independent with HEP to continue benefits of therapy until after DC.    Baseline  Dependent for form and technique    Time  6    Period  Weeks    Status  New    Target Date  01/17/19      PT LONG TERM GOAL #2   Title  Patient will improve TUG to under 12 second to improve dynamic balance and decrease fall  risk.    Baseline  25 sec with use of rollator    Time  6    Period  Weeks    Status  New    Target Date  01/17/19      PT LONG TERM GOAL #3   Title  Patient will improve 5xSTS to under 12 second from standard surface to improve functional strength and decrease fall risk.    Baseline  25 sec from 21" surface    Time  6    Period  Weeks    Status  New    Target Date  01/17/19      PT LONG TERM GOAL #4   Title  Patient will improve 54mwt to over .10m/s to improve dynamic balance and decrease fall risk.    Baseline  .4 m/s    Time  6    Period  Weeks    Status  New    Target Date  01/17/19            Plan - 12/11/18 1556    Clinical Impression Statement  Patient demonstrates positive trendelenberg on the L side most notably with stepping motions with significant pelvic tilts on the L side. Continued to focus on improving dynamic balance with  stepping motions to challenge balance further. Patient demonstrates less loss of balance backwards compared to previous sessions, but conitnues to step up to maintain balance intermittently when ambulating. Patient will benefit from further skilled therapy to return to prior level of function.    Personal Factors and Comorbidities  Age;Comorbidity 3+;Other    Comorbidities  DOE, MIx2, COPD    Examination-Activity Limitations  Bathing;Squat;Lift;Bend;Stand;Carry    Examination-Participation Restrictions  Laundry;Shop;Meal Prep;Yard Work;Cleaning    Stability/Clinical Decision Making  Unstable/Unpredictable    Rehab Potential  Fair    PT Frequency  2x / week    PT Duration  6 weeks    PT Treatment/Interventions  Therapeutic activities;Therapeutic exercise;Cryotherapy;Moist Heat;Stair training;Gait training;Balance training;Neuromuscular re-education;Patient/family education;Passive range of motion;Joint Manipulations;Manual techniques    PT Next Visit Plan  progress strengthening techniques    PT Home Exercise Plan  See education section       Patient will benefit from skilled therapeutic intervention in order to improve the following deficits and impairments:  Abnormal gait, Decreased balance, Decreased endurance, Decreased mobility, Difficulty walking, Increased muscle spasms, Decreased range of motion, Decreased activity tolerance, Decreased coordination, Decreased strength, Hypermobility, Pain, Postural dysfunction  Visit Diagnosis: Muscle weakness (generalized)  History of falling     Problem List Patient Active Problem List   Diagnosis Date Noted  . GI bleed 08/18/2015    Blythe Stanford, PT DPT 12/12/2018, 1:43 PM  [facility] [address] [place], [address] Phone: [phone]   Fax:  [phone]  Name: Sheryl Suarez MRN: [id number] Date of Birth: 10-May-1923

## 2018-12-13 ENCOUNTER — Other Ambulatory Visit: Payer: Self-pay

## 2018-12-13 ENCOUNTER — Ambulatory Visit: Payer: Medicare Other

## 2018-12-13 DIAGNOSIS — Z9181 History of falling: Secondary | ICD-10-CM

## 2018-12-13 DIAGNOSIS — M6281 Muscle weakness (generalized): Secondary | ICD-10-CM | POA: Diagnosis not present

## 2018-12-13 NOTE — Therapy (Signed)
[facility] [address], [place], [address] Phone: [phone]   Fax:  [phone]  Physical Therapy Treatment  Patient Details  Name: Sheryl Suarez MRN: [id number] Date of Birth: Nov 17, 1923 Referring Provider (PT): Edwina Barth MD   Encounter Date: 12/13/2018  PT End of Session - 12/13/18 1613    Visit Number  7    Number of Visits  16    Date for PT Re-Evaluation  01/17/19    PT Start Time  1430    PT Stop Time  1515    PT Time Calculation (min)  45 min    Equipment Utilized During Treatment  Gait belt    Activity Tolerance  Patient tolerated treatment well;Patient limited by fatigue    Behavior During Therapy  [facility] for tasks assessed/performed       Past Medical History:  Diagnosis Date  . Breast cancer ([place])    remission  . Hypertension     Past Surgical History:  Procedure Laterality Date  . ABDOMINAL HYSTERECTOMY    . APPENDECTOMY    . BREAST IMPLANT EXCHANGE    . CHOLECYSTECTOMY    . ESOPHAGOGASTRODUODENOSCOPY (EGD) WITH PROPOFOL N/A 08/20/2015   Procedure: ESOPHAGOGASTRODUODENOSCOPY (EGD) WITH PROPOFOL;  Surgeon: Lollie Sails, MD;  Location: [facility] ENDOSCOPY;  Service: Endoscopy;  Laterality: N/A;  . MASTECTOMY Bilateral     There were no vitals filed for this visit.  Subjective Assessment - 12/13/18 1611    Subjective  Pt reports improvement in LE swelling, but c/o ongoing B LE weakness.  She reports that she doesn't know what is causing the weakness, but that it concerns her.    Pertinent History  MI x 2, COPD, HTN    Limitations  Lifting;Standing;Walking    Patient Stated Goals  To improve strength    Currently in Pain?  No/denies        Treatment:  Therapeutic Exercise:  Sit to Stands from green chair 2x5 with verbal cueing for improved performance; Ambulation with [place] x 200 ft with verbal cueing for cane placement; standing hip / and hip abd SLR at bar 2x10 B; mini squats at  bar 2x10; seated hip add ball squeezes with combined glut squeeze 20x; LAQ's with 2# ankle wt B 2x10 B.  Neuromuscular Re-Education:  Tandem stance beside bar without UE assist; SLS on AirEx foam pad at bar with intermittent hand hold on bar; narrow BOS on AirEx pad with EO/EC and no hand assist (SBA/CGA x1).  Pt's O2 sats monitored throughout session and above 92% at all times.                       PT Education - 12/13/18 1612    Education Details  form/technique with exercise    Person(s) Educated  Patient    Methods  Explanation;Demonstration    Comprehension  Verbalized understanding;Returned demonstration          PT Long Term Goals - 11/22/18 1857      PT LONG TERM GOAL #1   Title  Patient will be independent with HEP to continue benefits of therapy until after DC.    Baseline  Dependent for form and technique    Time  6    Period  Weeks    Status  New    Target Date  01/17/19      PT LONG TERM GOAL #2   Title  Patient will improve TUG  to under 12 second to improve dynamic balance and decrease fall risk.    Baseline  25 sec with use of rollator    Time  6    Period  Weeks    Status  New    Target Date  01/17/19      PT LONG TERM GOAL #3   Title  Patient will improve 5xSTS to under 12 second from standard surface to improve functional strength and decrease fall risk.    Baseline  25 sec from 21" surface    Time  6    Period  Weeks    Status  New    Target Date  01/17/19      PT LONG TERM GOAL #4   Title  Patient will improve 63mwt to over .59m/s to improve dynamic balance and decrease fall risk.    Baseline  .4 m/s    Time  6    Period  Weeks    Status  New    Target Date  01/17/19            Plan - 12/13/18 1614    Clinical Impression Statement  Pt brought a straight cane to session today and stated she was given the cane as a "hand me down" and she decided to use it to be more steady with gait.  Cane height was assessed and needs  to be slightly shorter, but it is at the lowest setting possible currently.  Pt was educated on proper gait sequencing with [facility] during session today.  Pt was also instructed in how to work on standing balance at kitchen counter safely in tandem stance and SLS.    Personal Factors and Comorbidities  Age;Comorbidity 3+;Other    Comorbidities  DOE, MIx2, COPD    Examination-Activity Limitations  Bathing;Squat;Lift;Bend;Stand;Carry    Examination-Participation Restrictions  Laundry;Shop;Meal Prep;Yard Work;Cleaning    Stability/Clinical Decision Making  Unstable/Unpredictable    Clinical Decision Making  Moderate    Rehab Potential  Fair    PT Frequency  2x / week    PT Duration  6 weeks    PT Treatment/Interventions  Therapeutic activities;Therapeutic exercise;Cryotherapy;Moist Heat;Stair training;Gait training;Balance training;Neuromuscular re-education;Patient/family education;Passive range of motion;Joint Manipulations;Manual techniques    PT Next Visit Plan  progress strengthening techniques    PT Home Exercise Plan  See education section       Patient will benefit from skilled therapeutic intervention in order to improve the following deficits and impairments:  Abnormal gait, Decreased balance, Decreased endurance, Decreased mobility, Difficulty walking, Increased muscle spasms, Decreased range of motion, Decreased activity tolerance, Decreased coordination, Decreased strength, Hypermobility, Pain, Postural dysfunction  Visit Diagnosis: Muscle weakness (generalized)  History of falling     Problem List Patient Active Problem List   Diagnosis Date Noted  . GI bleed 08/18/2015    Coley Kulikowski, MPT 12/13/2018, 4:21 PM  [facility] [place] [address], [place], [address] Phone: [phone]   Fax:  [phone]  Name: Sheryl Suarez MRN: [id number] Date of Birth: 11-Feb-1924

## 2018-12-18 ENCOUNTER — Other Ambulatory Visit: Payer: Self-pay

## 2018-12-18 ENCOUNTER — Ambulatory Visit: Payer: Medicare Other

## 2018-12-18 DIAGNOSIS — M6281 Muscle weakness (generalized): Secondary | ICD-10-CM

## 2018-12-18 DIAGNOSIS — Z9181 History of falling: Secondary | ICD-10-CM

## 2018-12-18 NOTE — Therapy (Signed)
[facility] [address], [place], [address] Phone: [phone]   Fax:  [phone]  Physical Therapy Treatment  Patient Details  Name: Sheryl Suarez MRN: [id number] Date of Birth: [birth year] Referring Provider (PT): Edwina Barth MD   Encounter Date: 12/18/2018  PT End of Session - 12/18/18 1301    Visit Number  8    Number of Visits  16    Date for PT Re-Evaluation  01/17/19    PT Start Time  1301    PT Stop Time  1345    PT Time Calculation (min)  44 min    Equipment Utilized During Treatment  Gait belt    Activity Tolerance  Patient tolerated treatment well;Patient limited by fatigue    Behavior During Therapy  [facility] for tasks assessed/performed       Past Medical History:  Diagnosis Date  . Breast cancer ([place])    remission  . Hypertension     Past Surgical History:  Procedure Laterality Date  . ABDOMINAL HYSTERECTOMY    . APPENDECTOMY    . BREAST IMPLANT EXCHANGE    . CHOLECYSTECTOMY    . ESOPHAGOGASTRODUODENOSCOPY (EGD) WITH PROPOFOL N/A 08/20/2015   Procedure: ESOPHAGOGASTRODUODENOSCOPY (EGD) WITH PROPOFOL;  Surgeon: Lollie Sails, MD;  Location: [facility] ENDOSCOPY;  Service: Endoscopy;  Laterality: N/A;  . MASTECTOMY Bilateral     There were no vitals filed for this visit.  Subjective Assessment - 12/18/18 1305    Subjective  Pt reports LE swelling is reduced and less painful, but still feels like the legs are weak.    Pertinent History  MI x 2, COPD, HTN    Limitations  Lifting;Standing;Walking    Patient Stated Goals  To improve strength    Currently in Pain?  No/denies       TREATMENT   TE Seated LAQ x 10 B for increased tissue perfusion Seated ankle pumps x 10 - instruction to reduce edema STS x 5 without UE support cues for power and balance  Standing leg abd on mat 1 x 10, 1 x 5 R/L for glute strengthening and improved balance Standing leg ext on mat 1 x 10 R/L for glute  strengthening   NMR Quiet standing EC, tandem stance, tandem stance EC on mat 2 x 10 sec holds each Lateral steps on mat 4 x 10 ft lengths for balance with ambulation Ambulation 100 ft with cane min A for safety - gait analysis reveals adduction in swing R>L with fatigue resulting in reduced BOS and increased fall risk Mat walks 4 x 10 ft with UE in high guard CGA for balance and safety over uneven surfaces in the community Ambulation 50 ft without cane min A for safety      PT Education - 12/18/18 1300    Education Details  form/technique with exercise; proper length of SPC    Person(s) Educated  Patient;Child(ren)    Methods  Explanation;Demonstration;Verbal cues    Comprehension  Verbalized understanding;Returned demonstration;Verbal cues required;Need further instruction          PT Long Term Goals - 11/22/18 1857      PT LONG TERM GOAL #1   Title  Patient will be independent with HEP to continue benefits of therapy until after DC.    Baseline  Dependent for form and technique    Time  6    Period  Weeks    Status  New    Target Date  01/17/19      PT LONG TERM GOAL #2   Title  Patient will improve TUG to under 12 second to improve dynamic balance and decrease fall risk.    Baseline  25 sec with use of rollator    Time  6    Period  Weeks    Status  New    Target Date  01/17/19      PT LONG TERM GOAL #3   Title  Patient will improve 5xSTS to under 12 second from standard surface to improve functional strength and decrease fall risk.    Baseline  25 sec from 21" surface    Time  6    Period  Weeks    Status  New    Target Date  01/17/19      PT LONG TERM GOAL #4   Title  Patient will improve 32mwt to over .17m/s to improve dynamic balance and decrease fall risk.    Baseline  .4 m/s    Time  6    Period  Weeks    Status  New    Target Date  01/17/19            Plan - 12/18/18 1451    Clinical Impression Statement  Pt demonstrates mild-moderate  adduction "scissoring" gait with fatigue resulting in decreased BOS and increased instability. Shows good retention of gait training from last session but cane catches on floor during "swing" due to being too long. Educated pt and daughter on need for correct length cane for safety. Pt demonstrated reduced activity tolerance initially in session but showed improvements prior to end of session. Able to tolerate balance exercises well. Vitals monitored throughout; SPO2 dropped to 89 during one recovery period. Patient will benefit from physical therapy to reduce fall risk in the home and community.    Personal Factors and Comorbidities  Age;Comorbidity 3+;Other    Comorbidities  DOE, MIx2, COPD    Examination-Activity Limitations  Bathing;Squat;Lift;Bend;Stand;Carry    Examination-Participation Restrictions  Laundry;Shop;Meal Prep;Yard Work;Cleaning    Stability/Clinical Decision Making  Unstable/Unpredictable    Rehab Potential  Fair    PT Frequency  2x / week    PT Duration  6 weeks    PT Treatment/Interventions  Therapeutic activities;Therapeutic exercise;Cryotherapy;Moist Heat;Stair training;Gait training;Balance training;Neuromuscular re-education;Patient/family education;Passive range of motion;Joint Manipulations;Manual techniques    PT Next Visit Plan  progress strengthening techniques    PT Home Exercise Plan  See education section    Consulted and Agree with Plan of Care  Patient       Patient will benefit from skilled therapeutic intervention in order to improve the following deficits and impairments:  Abnormal gait, Decreased balance, Decreased endurance, Decreased mobility, Difficulty walking, Increased muscle spasms, Decreased range of motion, Decreased activity tolerance, Decreased coordination, Decreased strength, Hypermobility, Pain, Postural dysfunction  Visit Diagnosis: Muscle weakness (generalized)  History of falling     Problem List Patient Active Problem List    Diagnosis Date Noted  . GI bleed 08/18/2015    Virgia Land, SPT 12/18/2018, 3:00 PM  [facility] [address] [place], [address] Phone: [phone]   Fax:  [phone]  Name: Sheryl Suarez MRN: [id number] Date of Birth: 11-01-1923

## 2018-12-20 ENCOUNTER — Ambulatory Visit: Payer: Medicare Other

## 2018-12-24 ENCOUNTER — Other Ambulatory Visit: Payer: Self-pay

## 2018-12-24 ENCOUNTER — Ambulatory Visit: Payer: Medicare Other | Attending: Internal Medicine

## 2018-12-24 DIAGNOSIS — M6281 Muscle weakness (generalized): Secondary | ICD-10-CM

## 2018-12-24 DIAGNOSIS — Z9181 History of falling: Secondary | ICD-10-CM

## 2018-12-24 NOTE — Therapy (Signed)
[facility] [address] [place], [address] Phone: [phone]   Fax:  [phone]  Physical Therapy Treatment  Patient Details  Name: Sheryl Suarez MRN: [id number] Date of Birth: [birth year] Referring Provider (PT): Edwina Barth MD   Encounter Date: 12/24/2018  PT End of Session - 12/24/18 1332    Visit Number  9    Number of Visits  16    Date for PT Re-Evaluation  01/17/19    PT Start Time  [id number]    PT Stop Time  1345    PT Time Calculation (min)  41 min    Equipment Utilized During Treatment  Gait belt    Activity Tolerance  Patient tolerated treatment well;Patient limited by fatigue    Behavior During Therapy  [facility] for tasks assessed/performed       Past Medical History:  Diagnosis Date  . Breast cancer ([place])    remission  . Hypertension     Past Surgical History:  Procedure Laterality Date  . ABDOMINAL HYSTERECTOMY    . APPENDECTOMY    . BREAST IMPLANT EXCHANGE    . CHOLECYSTECTOMY    . ESOPHAGOGASTRODUODENOSCOPY (EGD) WITH PROPOFOL N/A 08/20/2015   Procedure: ESOPHAGOGASTRODUODENOSCOPY (EGD) WITH PROPOFOL;  Surgeon: Lollie Sails, MD;  Location: [facility] ENDOSCOPY;  Service: Endoscopy;  Laterality: N/A;  . MASTECTOMY Bilateral     There were no vitals filed for this visit.  Subjective Assessment - 12/24/18 1301    Subjective  Patient slightly worried about slow-healing scrapes on RLE. Reports with new wide-based SPC bought by her daughter.    Pertinent History  MI x 2, COPD, HTN    Limitations  Lifting;Standing;Walking    Patient Stated Goals  To improve strength    Currently in Pain?  No/denies       Pre-session HR 91 O2 94  TREATMENT TE Amb 200 ft c SPC with new cane trialing different heights requiring CGA for safety Step ups 6" 2 x 5 with RUE assist, 1 x 3 without UE assist Standing LE abd R/L with faded UE support BLE > UE > no UE support x 10 each Amb 200 ft c SPC with head  turns requiring CGA for safety TE for improved power and functional capacity with ADLs  NMR Standing perturbations x 10 eyes open, x 10 EC Marches on foam 2 x 10 with faded UE support BLE > no UE support NMR for reduced fall risk and improved proprioception  Post session O2 92 HR 78     PT Education - 12/24/18 1259    Education Details  form/technique with exercise    Person(s) Educated  Patient    Methods  Explanation;Demonstration;Verbal cues    Comprehension  Verbalized understanding;Returned demonstration;Verbal cues required          PT Long Term Goals - 11/22/18 1857      PT LONG TERM GOAL #1   Title  Patient will be independent with HEP to continue benefits of therapy until after DC.    Baseline  Dependent for form and technique    Time  6    Period  Weeks    Status  New    Target Date  01/17/19      PT LONG TERM GOAL #2   Title  Patient will improve TUG to under 12 second to improve dynamic balance and decrease fall risk.    Baseline  25 sec with use of rollator  Time  6    Period  Weeks    Status  New    Target Date  01/17/19      PT LONG TERM GOAL #3   Title  Patient will improve 5xSTS to under 12 second from standard surface to improve functional strength and decrease fall risk.    Baseline  25 sec from 21" surface    Time  6    Period  Weeks    Status  New    Target Date  01/17/19      PT LONG TERM GOAL #4   Title  Patient will improve 70mwt to over .69m/s to improve dynamic balance and decrease fall risk.    Baseline  .4 m/s    Time  6    Period  Weeks    Status  New    Target Date  01/17/19            Plan - 12/24/18 1345    Clinical Impression Statement  Patient demonstrates significant gains this session most notably in endurance with ambulation and in quality of movement with gait. Note reduced adductor compensation and LOB with ambuation requiring CGA. Note reduced edema in LLE. SPC assessed and found to be appropriate height.  Patient demonstrates gains in power movements including step up and STS and improved balance with reduced use of UE. Plan to retest against goals next session to quantify gains and remaining deficits. Vitals monitored throughout with SPO2 dropping to 88 during one recovery period but instrument may be unreliable; when reassessed, found to be at 92. Patient will benefit from skilled physical therapy to reduce fall risk and improve functional capacity with ADLs.    Personal Factors and Comorbidities  Age;Comorbidity 3+;Other    Comorbidities  DOE, MIx2, COPD    Examination-Activity Limitations  Bathing;Squat;Lift;Bend;Stand;Carry    Examination-Participation Restrictions  Laundry;Shop;Meal Prep;Yard Work;Cleaning    Stability/Clinical Decision Making  Unstable/Unpredictable    Rehab Potential  Fair    PT Frequency  2x / week    PT Duration  6 weeks    PT Treatment/Interventions  Therapeutic activities;Therapeutic exercise;Cryotherapy;Moist Heat;Stair training;Gait training;Balance training;Neuromuscular re-education;Patient/family education;Passive range of motion;Joint Manipulations;Manual techniques    PT Next Visit Plan  progress strengthening techniques    PT Home Exercise Plan  See education section    Consulted and Agree with Plan of Care  Patient       Patient will benefit from skilled therapeutic intervention in order to improve the following deficits and impairments:  Abnormal gait, Decreased balance, Decreased endurance, Decreased mobility, Difficulty walking, Increased muscle spasms, Decreased range of motion, Decreased activity tolerance, Decreased coordination, Decreased strength, Hypermobility, Pain, Postural dysfunction  Visit Diagnosis: Muscle weakness (generalized)  History of falling     Problem List Patient Active Problem List   Diagnosis Date Noted  . GI bleed 08/18/2015    Virgia Land, SPT 12/24/2018, 2:13 PM  [facility] [address] [place], [address] Phone: [phone]   Fax:  [phone]  Name: Sheryl Suarez MRN: [id number] Date of Birth: August 04, 1923

## 2018-12-27 ENCOUNTER — Ambulatory Visit: Payer: Medicare Other

## 2018-12-27 ENCOUNTER — Other Ambulatory Visit: Payer: Self-pay

## 2018-12-27 DIAGNOSIS — Z9181 History of falling: Secondary | ICD-10-CM

## 2018-12-27 DIAGNOSIS — M6281 Muscle weakness (generalized): Secondary | ICD-10-CM | POA: Diagnosis not present

## 2018-12-27 NOTE — Therapy (Signed)
[place] [facility] [address] [place], [address] Phone: [phone]   Fax:  [phone]  Physical Therapy Treatment/Progress note  Reporting period: 11/22/2018 - 12/27/2018 Patient Details  Name: Sheryl Suarez MRN: [id number] Date of Birth: November 18, 1923 Referring Provider (PT): Edwina Barth MD   Encounter Date: 12/27/2018  PT End of Session - 12/27/18 1426    Visit Number  10    Number of Visits  16    Date for PT Re-Evaluation  01/17/19    PT Start Time  1430    PT Stop Time  1515    PT Time Calculation (min)  45 min    Equipment Utilized During Treatment  Gait belt    Activity Tolerance  Patient tolerated treatment well;Patient limited by fatigue    Behavior During Therapy  [facility] for tasks assessed/performed       Past Medical History:  Diagnosis Date  . Breast cancer ([place])    remission  . Hypertension     Past Surgical History:  Procedure Laterality Date  . ABDOMINAL HYSTERECTOMY    . APPENDECTOMY    . BREAST IMPLANT EXCHANGE    . CHOLECYSTECTOMY    . ESOPHAGOGASTRODUODENOSCOPY (EGD) WITH PROPOFOL N/A 08/20/2015   Procedure: ESOPHAGOGASTRODUODENOSCOPY (EGD) WITH PROPOFOL;  Surgeon: Lollie Sails, MD;  Location: [facility] ENDOSCOPY;  Service: Endoscopy;  Laterality: N/A;  . MASTECTOMY Bilateral     There were no vitals filed for this visit.  Subjective Assessment - 12/27/18 1431    Subjective  Patient says she feels like she is getting weaker.    Pertinent History  MI x 2, COPD, HTN    Limitations  Lifting;Standing;Walking    Patient Stated Goals  To improve strength    Currently in Pain?  No/denies       TREATMENT  Goals: TUG: 14.4 sec 5xSTS: 19.5 sec 10MWT: 0.78 m/sec 2MWT: 210 ft  TE Retrowalking x 30 ft Hurdles forward x 20 ft backward x 15 ft LAQ 3# x 10 R/L HS curls 3# x 10 R/L Ambulation 150 ft at end of session to assess effect of fatigue - patient tires more quickly but does not  demonstrate reduced dynamic postural control  TE for improved activity tolerance and hip strength  NMR Marches 3# in standing x 10 with faded UE support  Foam Mini marches 2 x 10  SLS multiple trials - over L 8 sec, over R 4 sec max  NMR for reduced fall risk with ADLs.   PT Education - 12/27/18 1425    Education Details  form/technique with exercise    Person(s) Educated  Patient    Methods  Explanation;Demonstration;Tactile cues;Verbal cues    Comprehension  Verbalized understanding;Returned demonstration;Verbal cues required;Tactile cues required          PT Long Term Goals - 12/27/18 1802      PT LONG TERM GOAL #1   Title  Patient will be independent with HEP to continue benefits of therapy until after DC.    Baseline  Dependent for form and technique    Time  6    Period  Weeks    Status  Achieved      PT LONG TERM GOAL #2   Title  Patient will improve TUG to under 12 second to improve dynamic balance and decrease fall risk.    Baseline  25 sec with use of rollator; 12/27/18 14.4 sec with use of SPC    Time  6    Period  Weeks    Status  On-going    Target Date  01/17/19      PT LONG TERM GOAL #3   Title  Patient will improve 5xSTS to under 12 second from standard surface to improve functional strength and decrease fall risk.    Baseline  25 sec from 21" surface; 12/27/2018 19.5 sec    Time  6    Period  Weeks    Status  On-going      PT LONG TERM GOAL #4   Title  Patient will improve 20mwt to over .15m/s to improve dynamic balance and decrease fall risk.    Baseline  .4 m/s; 12/27/2018 0.78 sec    Time  6    Period  Weeks    Status  On-going      PT LONG TERM GOAL #5   Title  Patient will improve 2MWT to 275 feet to demonstrate increase in functional capacity    Baseline  12/27/2018 210 ft    Time  6    Period  Weeks    Status  New    Target Date  02/07/19            Plan - 12/28/18 1011    Clinical Impression Statement  Patient is making  significant progress towards long term goals with improved scores in measurements of the TUG, 5xSTS, and 81mwt indicating improvement in functional strength and fall risk with ambulation. Although patient is improving, she continues to have poor balance when walking for longer distance >172ft with greater abberant motions with her LEs and reports an increase in fatigue during performance. Patient is improving overall, but continues to have signficant limitations in line with fall risk. Patient will benefit from further skiled therapy to return to prior level of function.    Personal Factors and Comorbidities  Age;Comorbidity 3+;Other    Comorbidities  DOE, MIx2, COPD    Examination-Activity Limitations  Bathing;Squat;Lift;Bend;Stand;Carry    Examination-Participation Restrictions  Laundry;Shop;Meal Prep;Yard Work;Cleaning    Stability/Clinical Decision Making  Unstable/Unpredictable    Rehab Potential  Fair    PT Frequency  2x / week    PT Duration  6 weeks    PT Treatment/Interventions  Therapeutic activities;Therapeutic exercise;Cryotherapy;Moist Heat;Stair training;Gait training;Balance training;Neuromuscular re-education;Patient/family education;Passive range of motion;Joint Manipulations;Manual techniques    PT Next Visit Plan  progress strengthening techniques    PT Home Exercise Plan  See education section    Consulted and Agree with Plan of Care  Patient       Patient will benefit from skilled therapeutic intervention in order to improve the following deficits and impairments:  Abnormal gait, Decreased balance, Decreased endurance, Decreased mobility, Difficulty walking, Increased muscle spasms, Decreased range of motion, Decreased activity tolerance, Decreased coordination, Decreased strength, Hypermobility, Pain, Postural dysfunction  Visit Diagnosis: Muscle weakness (generalized)  History of falling     Problem List Patient Active Problem List   Diagnosis Date Noted  . GI  bleed 08/18/2015    Virgia Land, SPT 12/28/2018, 10:15 AM  [place] [address] [place], [address] Phone: [phone]   Fax:  [phone]  Name: Sheryl Suarez MRN: [id number] Date of Birth: [birth year]

## 2019-01-01 ENCOUNTER — Ambulatory Visit: Payer: Medicare Other

## 2019-01-01 ENCOUNTER — Other Ambulatory Visit: Payer: Self-pay

## 2019-01-01 DIAGNOSIS — M6281 Muscle weakness (generalized): Secondary | ICD-10-CM

## 2019-01-01 DIAGNOSIS — Z9181 History of falling: Secondary | ICD-10-CM

## 2019-01-02 NOTE — Therapy (Signed)
[facility] [address], [place], [address] Phone: [phone]   Fax:  [phone]  Physical Therapy Treatment  Patient Details  Name: Sheryl Suarez MRN: [id number] Date of Birth: 04-29-1923 Referring Provider (PT): Edwina Barth MD   Encounter Date: 01/01/2019  PT End of Session - 01/01/19 1432    Visit Number  11    Number of Visits  16    Date for PT Re-Evaluation  01/17/19    PT Start Time  [id number]    PT Stop Time  1515    PT Time Calculation (min)  42 min    Equipment Utilized During Treatment  Gait belt    Activity Tolerance  Patient tolerated treatment well;Patient limited by fatigue    Behavior During Therapy  [facility] for tasks assessed/performed       Past Medical History:  Diagnosis Date  . Breast cancer ([place])    remission  . Hypertension     Past Surgical History:  Procedure Laterality Date  . ABDOMINAL HYSTERECTOMY    . APPENDECTOMY    . BREAST IMPLANT EXCHANGE    . CHOLECYSTECTOMY    . ESOPHAGOGASTRODUODENOSCOPY (EGD) WITH PROPOFOL N/A 08/20/2015   Procedure: ESOPHAGOGASTRODUODENOSCOPY (EGD) WITH PROPOFOL;  Surgeon: Lollie Sails, MD;  Location: [facility] ENDOSCOPY;  Service: Endoscopy;  Laterality: N/A;  . MASTECTOMY Bilateral     There were no vitals filed for this visit.  Subjective Assessment - 01/01/19 1434    Subjective  Patient reports that she is doing well.    Pertinent History  MI x 2, COPD, HTN    Limitations  Lifting;Standing;Walking    Patient Stated Goals  To improve strength    Currently in Pain?  No/denies     T  REATMENT    Pre-tx HR 72 O2 95 Instruction in diaphagmatic breathing for increasing O2 sat, increases to 97  GT Ambulation 200 ft CGA note no LOB improved postural control and activity tolerance cues for increased step length Gait training 150 ft sequencing with cane and increased swing-through with contralateral LE Ambulation 250 ft end of session - note  improved sequencing with cane and activity tolerance  TE Step ups x 15 R/L without UE support Sit <> stand x 8 cues for power on concentric and slow tempo on eccentric  NMR Airex foam balance SLS 4-5 sec holds x 10 R/L  O2 monitored throughout session and did not fall below 89.      PT Education - 01/01/19 1432    Education Details  form/technique with exercise    Person(s) Educated  Patient    Methods  Explanation;Demonstration;Tactile cues;Verbal cues    Comprehension  Verbalized understanding;Returned demonstration;Verbal cues required;Tactile cues required          PT Long Term Goals - 12/27/18 1802      PT LONG TERM GOAL #1   Title  Patient will be independent with HEP to continue benefits of therapy until after DC.    Baseline  Dependent for form and technique    Time  6    Period  Weeks    Status  Achieved      PT LONG TERM GOAL #2   Title  Patient will improve TUG to under 12 second to improve dynamic balance and decrease fall risk.    Baseline  25 sec with use of rollator; 12/27/18 14.4 sec with use of SPC    Time  6  Period  Weeks    Status  On-going    Target Date  01/17/19      PT LONG TERM GOAL #3   Title  Patient will improve 5xSTS to under 12 second from standard surface to improve functional strength and decrease fall risk.    Baseline  25 sec from 21" surface; 12/27/2018 19.5 sec    Time  6    Period  Weeks    Status  On-going      PT LONG TERM GOAL #4   Title  Patient will improve 36mwt to over .44m/s to improve dynamic balance and decrease fall risk.    Baseline  .4 m/s; 12/27/2018 0.78 sec    Time  6    Period  Weeks    Status  On-going      PT LONG TERM GOAL #5   Title  Patient will improve 2MWT to 275 feet to demonstrate increase in functional capacity    Baseline  12/27/2018 210 ft    Time  6    Period  Weeks    Status  New    Target Date  02/07/19              Patient will benefit from skilled therapeutic intervention in  order to improve the following deficits and impairments:     Visit Diagnosis: Muscle weakness (generalized)  History of falling     Problem List Patient Active Problem List   Diagnosis Date Noted  . GI bleed 08/18/2015    Virgia Land, SPT 01/02/2019, 12:23 PM  [place] [facility] [address] [place], [address] Phone: [phone]   Fax:  [phone]  Name: Sheryl Suarez MRN: [id number] Date of Birth: 05-10-1923

## 2019-01-03 ENCOUNTER — Other Ambulatory Visit: Payer: Self-pay

## 2019-01-03 ENCOUNTER — Ambulatory Visit: Payer: Medicare Other

## 2019-01-03 DIAGNOSIS — M6281 Muscle weakness (generalized): Secondary | ICD-10-CM | POA: Diagnosis not present

## 2019-01-03 DIAGNOSIS — Z9181 History of falling: Secondary | ICD-10-CM

## 2019-01-03 NOTE — Therapy (Signed)
[place] [facility] [address], [place], [address] Phone: [phone]   Fax:  [phone]  Physical Therapy Treatment  Patient Details  Name: Sheryl Suarez MRN: [id number] Date of Birth: 11-14-1923 Referring Provider (PT): Edwina Barth MD   Encounter Date: 01/03/2019  PT End of Session - 01/03/19 1415    Visit Number  12    Number of Visits  16    Date for PT Re-Evaluation  01/17/19    Authorization Type  2 / 10    PT Start Time  1415    PT Stop Time  1500    PT Time Calculation (min)  45 min    Equipment Utilized During Treatment  Gait belt    Activity Tolerance  Patient tolerated treatment well;Patient limited by fatigue    Behavior During Therapy  [facility] for tasks assessed/performed       Past Medical History:  Diagnosis Date  . Breast cancer ([place])    remission  . Hypertension     Past Surgical History:  Procedure Laterality Date  . ABDOMINAL HYSTERECTOMY    . APPENDECTOMY    . BREAST IMPLANT EXCHANGE    . CHOLECYSTECTOMY    . ESOPHAGOGASTRODUODENOSCOPY (EGD) WITH PROPOFOL N/A 08/20/2015   Procedure: ESOPHAGOGASTRODUODENOSCOPY (EGD) WITH PROPOFOL;  Surgeon: Lollie Sails, MD;  Location: [facility] ENDOSCOPY;  Service: Endoscopy;  Laterality: N/A;  . MASTECTOMY Bilateral     There were no vitals filed for this visit.  Subjective Assessment - 01/03/19 1417    Subjective  Patient reports no changes in status but says she feels weaker.    Pertinent History  MI x 2, COPD, HTN    Limitations  Lifting;Standing;Walking    Patient Stated Goals  To improve strength    Currently in Pain?  No/denies       TREATMENT  Vitals pre-tx O2 97 HR 53   GT Ambulation three trials x 100, x 50, x 150 ft. O2 dropped each trial as low as 78 recovering quickly with rest. A second instrument was used for subsequent trials demonstrating no drop below 94. Ambulation x 200, x 200, x 100 ft to improve pt functional capacity  with ambulation. Pt required CGA throughout for safety.  TE Step ups x 5 R/L 4" step Sit <> stand x 6 sit <> stand x 3 onto Airex foam. Patient required min A for balance upon rising  NMR Airex foam with UE perturbations x 10 pt required min A to maintain balance due to backward lean with UE overhead. Airex foam standing external perturbations x 20 in multiple directions with CGA note mild deficit with postural control with posterior shift.  Post tx: O2 94 HR 57       PT Education - 01/03/19 1414    Education Details  form/technique with exercise    Person(s) Educated  Patient    Methods  Explanation;Demonstration;Tactile cues;Verbal cues    Comprehension  Verbalized understanding;Returned demonstration;Verbal cues required;Tactile cues required          PT Long Term Goals - 12/27/18 1802      PT LONG TERM GOAL #1   Title  Patient will be independent with HEP to continue benefits of therapy until after DC.    Baseline  Dependent for form and technique    Time  6    Period  Weeks    Status  Achieved      PT LONG TERM GOAL #2  Title  Patient will improve TUG to under 12 second to improve dynamic balance and decrease fall risk.    Baseline  25 sec with use of rollator; 12/27/18 14.4 sec with use of SPC    Time  6    Period  Weeks    Status  On-going    Target Date  01/17/19      PT LONG TERM GOAL #3   Title  Patient will improve 5xSTS to under 12 second from standard surface to improve functional strength and decrease fall risk.    Baseline  25 sec from 21" surface; 12/27/2018 19.5 sec    Time  6    Period  Weeks    Status  On-going      PT LONG TERM GOAL #4   Title  Patient will improve 82mwt to over .46m/s to improve dynamic balance and decrease fall risk.    Baseline  .4 m/s; 12/27/2018 0.78 sec    Time  6    Period  Weeks    Status  On-going      PT LONG TERM GOAL #5   Title  Patient will improve 2MWT to 275 feet to demonstrate increase in functional  capacity    Baseline  12/27/2018 210 ft    Time  6    Period  Weeks    Status  New    Target Date  02/07/19            Plan - 01/03/19 1522    Clinical Impression Statement  Treatment today limited by fatigue and unstable vitals especially low O2 saturation with prolonged ambulation; however suspect unreliable readings from machine. Use of second O2 sensor showed no drop in saturation with blunted HR response consistent with beta blockers. Patient was able to complete challenging power- and balance-focused exercises and demonstrated walking tolerance consistent with prior visits by end of session. Patient remained A&Ox3 throughout but moderate suspicion of memory impairment remains. Patient will benefit from skilled PT to return to PLOF.    Personal Factors and Comorbidities  Age;Comorbidity 3+;Other    Comorbidities  DOE, MIx2, COPD    Examination-Activity Limitations  Bathing;Squat;Lift;Bend;Stand;Carry    Examination-Participation Restrictions  Laundry;Shop;Meal Prep;Yard Work;Cleaning    Stability/Clinical Decision Making  Unstable/Unpredictable    Rehab Potential  Fair    PT Frequency  2x / week    PT Duration  6 weeks    PT Treatment/Interventions  Therapeutic activities;Therapeutic exercise;Cryotherapy;Moist Heat;Stair training;Gait training;Balance training;Neuromuscular re-education;Patient/family education;Passive range of motion;Joint Manipulations;Manual techniques    PT Next Visit Plan  progress strengthening techniques    PT Home Exercise Plan  See education section    Consulted and Agree with Plan of Care  Patient       Patient will benefit from skilled therapeutic intervention in order to improve the following deficits and impairments:  Abnormal gait, Decreased balance, Decreased endurance, Decreased mobility, Difficulty walking, Increased muscle spasms, Decreased range of motion, Decreased activity tolerance, Decreased coordination, Decreased strength, Hypermobility,  Pain, Postural dysfunction  Visit Diagnosis: Muscle weakness (generalized)  History of falling     Problem List Patient Active Problem List   Diagnosis Date Noted  . GI bleed 08/18/2015    Sheryl Suarez, Sheryl Suarez 01/03/2019, 6:06 PM  [place] [facility] [address] [place], [address] Phone: [phone]   Fax:  [phone]  Name: Sheryl Suarez MRN: [id number] Date of Birth: December 09, 1923

## 2019-01-08 ENCOUNTER — Ambulatory Visit: Payer: Medicare Other

## 2019-01-08 ENCOUNTER — Other Ambulatory Visit: Payer: Self-pay

## 2019-01-08 DIAGNOSIS — M6281 Muscle weakness (generalized): Secondary | ICD-10-CM

## 2019-01-08 DIAGNOSIS — Z9181 History of falling: Secondary | ICD-10-CM

## 2019-01-08 NOTE — Therapy (Signed)
[place] [facility] [address] [place], [address] Phone: [phone]   Fax:  [phone]  Physical Therapy Treatment  Patient Details  Name: Sheryl Suarez MRN: [id number] Date of Birth: [birth year] Referring Provider (PT): Edwina Barth MD   Encounter Date: 01/08/2019  PT End of Session - 01/08/19 1348    Visit Number  13    Number of Visits  16    Date for PT Re-Evaluation  01/17/19    Authorization Type  3 / 10    PT Start Time  [id number]    PT Stop Time  1430    PT Time Calculation (min)  42 min    Equipment Utilized During Treatment  Gait belt    Activity Tolerance  Patient tolerated treatment well;Patient limited by fatigue    Behavior During Therapy  [facility] for tasks assessed/performed       Past Medical History:  Diagnosis Date  . Breast cancer ([place])    remission  . Hypertension     Past Surgical History:  Procedure Laterality Date  . ABDOMINAL HYSTERECTOMY    . APPENDECTOMY    . BREAST IMPLANT EXCHANGE    . CHOLECYSTECTOMY    . ESOPHAGOGASTRODUODENOSCOPY (EGD) WITH PROPOFOL N/A 08/20/2015   Procedure: ESOPHAGOGASTRODUODENOSCOPY (EGD) WITH PROPOFOL;  Surgeon: Lollie Sails, MD;  Location: [facility] ENDOSCOPY;  Service: Endoscopy;  Laterality: N/A;  . MASTECTOMY Bilateral     There were no vitals filed for this visit.  Subjective Assessment - 01/08/19 1349    Subjective  Patient reports no changes in status.    Pertinent History  MI x 2, COPD, HTN    Limitations  Lifting;Standing;Walking    Patient Stated Goals  To improve strength    Currently in Pain?  No/denies        TREATMENT   Pre-tx:  HR: 54 O2: 90 increased to 96 with rest  Ambulation 150 ft patient demonstrates decreased dynamic postural stability and early dyspnea on exertion requiring min A.  O2: dropped to 80 but recovers with instruction in diaphragmatic breathing  Ambulation 50 ft patient demonstrates decreased dynamic  postural stability requiring min A for safety  STS x 5 patient demonstrates dyspnea on completion of exercise  O2 and pulse readings variable; accuracy questionable due to poor pleth.     PT Education - 01/08/19 1346    Education Details  form/technique with exercise    Person(s) Educated  Patient    Methods  Explanation;Demonstration    Comprehension  Verbalized understanding;Returned demonstration          PT Long Term Goals - 12/27/18 1802      PT LONG TERM GOAL #1   Title  Patient will be independent with HEP to continue benefits of therapy until after DC.    Baseline  Dependent for form and technique    Time  6    Period  Weeks    Status  Achieved      PT LONG TERM GOAL #2   Title  Patient will improve TUG to under 12 second to improve dynamic balance and decrease fall risk.    Baseline  25 sec with use of rollator; 12/27/18 14.4 sec with use of SPC    Time  6    Period  Weeks    Status  On-going    Target Date  01/17/19      PT LONG TERM GOAL #3   Title  Patient will  improve 5xSTS to under 12 second from standard surface to improve functional strength and decrease fall risk.    Baseline  25 sec from 21" surface; 12/27/2018 19.5 sec    Time  6    Period  Weeks    Status  On-going      PT LONG TERM GOAL #4   Title  Patient will improve 51mwt to over .32m/s to improve dynamic balance and decrease fall risk.    Baseline  .4 m/s; 12/27/2018 0.78 sec    Time  6    Period  Weeks    Status  On-going      PT LONG TERM GOAL #5   Title  Patient will improve 2MWT to 275 feet to demonstrate increase in functional capacity    Baseline  12/27/2018 210 ft    Time  6    Period  Weeks    Status  New    Target Date  02/07/19            Plan - 01/08/19 1627    Clinical Impression Statement  Treatment today limited by fatigue. O2 appears to be low but assessment complicated by poor pulse in fingers. Repeated readings indicated possible reduction in O2 sat as low as 80  immediately following first trail of ambulation. Patient was symptomatic demonstrating gait deviations and dyspnea. PT to proceed with caution and will refer out if s/s continue; informed pt and pt's daughter of POC and they verbalized consent. Patient will benefit from continued PT to improve functional capacity and reduce fall risk.    Personal Factors and Comorbidities  Age;Comorbidity 3+;Other    Comorbidities  DOE, MIx2, COPD    Examination-Activity Limitations  Bathing;Squat;Lift;Bend;Stand;Carry    Examination-Participation Restrictions  Laundry;Shop;Meal Prep;Yard Work;Cleaning    Stability/Clinical Decision Making  Unstable/Unpredictable    Rehab Potential  Fair    PT Frequency  2x / week    PT Duration  6 weeks    PT Treatment/Interventions  Therapeutic activities;Therapeutic exercise;Cryotherapy;Moist Heat;Stair training;Gait training;Balance training;Neuromuscular re-education;Patient/family education;Passive range of motion;Joint Manipulations;Manual techniques    PT Next Visit Plan  progress strengthening techniques    PT Home Exercise Plan  See education section    Consulted and Agree with Plan of Care  Patient;Family member/caregiver    Family Member Consulted  Daughter       Patient will benefit from skilled therapeutic intervention in order to improve the following deficits and impairments:  Abnormal gait, Decreased balance, Decreased endurance, Decreased mobility, Difficulty walking, Increased muscle spasms, Decreased range of motion, Decreased activity tolerance, Decreased coordination, Decreased strength, Hypermobility, Pain, Postural dysfunction  Visit Diagnosis: Muscle weakness (generalized)  History of falling     Problem List Patient Active Problem List   Diagnosis Date Noted  . GI bleed 08/18/2015    Virgia Land, SPT 01/08/2019, 5:04 PM  [place] [facility] [address] [place],  [address] Phone: [phone]   Fax:  [phone]  Name: Sheryl Suarez MRN: [id number] Date of Birth: 02/16/1924

## 2019-01-10 ENCOUNTER — Other Ambulatory Visit: Payer: Self-pay

## 2019-01-10 ENCOUNTER — Ambulatory Visit: Payer: Medicare Other

## 2019-01-10 DIAGNOSIS — M6281 Muscle weakness (generalized): Secondary | ICD-10-CM

## 2019-01-10 DIAGNOSIS — Z9181 History of falling: Secondary | ICD-10-CM

## 2019-01-10 NOTE — Therapy (Signed)
[place] [facility] [address] [place], [address] Phone: [phone]   Fax:  [phone]  Physical Therapy Treatment  Patient Details  Name: Sheryl Suarez MRN: [id number] Date of Birth: 06-29-1923 Referring Provider (PT): Edwina Barth MD   Encounter Date: 01/10/2019  PT End of Session - 01/10/19 1228    Visit Number  14    Number of Visits  16    Date for PT Re-Evaluation  01/17/19    Authorization Type  4 / 10    PT Start Time  1115    PT Stop Time  1202    PT Time Calculation (min)  47 min    Equipment Utilized During Treatment  Gait belt    Activity Tolerance  Patient tolerated treatment well;Patient limited by fatigue    Behavior During Therapy  [facility] for tasks assessed/performed       Past Medical History:  Diagnosis Date  . Breast cancer ([place])    remission  . Hypertension     Past Surgical History:  Procedure Laterality Date  . ABDOMINAL HYSTERECTOMY    . APPENDECTOMY    . BREAST IMPLANT EXCHANGE    . CHOLECYSTECTOMY    . ESOPHAGOGASTRODUODENOSCOPY (EGD) WITH PROPOFOL N/A 08/20/2015   Procedure: ESOPHAGOGASTRODUODENOSCOPY (EGD) WITH PROPOFOL;  Surgeon: Lollie Sails, MD;  Location: [facility] ENDOSCOPY;  Service: Endoscopy;  Laterality: N/A;  . MASTECTOMY Bilateral     There were no vitals filed for this visit.  Subjective Assessment - 01/10/19 1227    Subjective  Patinet reports no changes in status.    Pertinent History  MI x 2, COPD, HTN    Limitations  Lifting;Standing;Walking    Patient Stated Goals  To improve strength    Currently in Pain?  No/denies      TREATMENT  Vitals at start of session:  SPO2: 99 HR: 66  Vitals monitored throughout session and remained WNL. Pleth indicative of afib.  Observation: Note improved healing over BLE scrapes  TE  Amb 100 ft monitoring vital response with min A for balance note three episodes of near-LOB with RLE crossing line of progression   Amb 200 ft note improved balance requiring CGA Amb 100 ft x 2 with standing rest between CGA. Note vitals respond quickly returning to patient's baseline. STS x5 with cues for quick concentric slow eccentric for improved power and control with functional activity min A for balance  TE for improved activity tolerance   NMR  Step ups 4" step 5x LLE without UE assist, 3x RLE HHA 1x no UE assist CGA Retro-walking 2 x 15 ft lengths for reduced fall risk to posterior                  PT Education - 01/10/19 1227    Education Details  form/technique with exercise    Person(s) Educated  Patient    Methods  Explanation;Demonstration;Verbal cues    Comprehension  Verbalized understanding;Returned demonstration;Verbal cues required          PT Long Term Goals - 12/27/18 1802      PT LONG TERM GOAL #1   Title  Patient will be independent with HEP to continue benefits of therapy until after DC.    Baseline  Dependent for form and technique    Time  6    Period  Weeks    Status  Achieved      PT LONG TERM GOAL #2   Title  Patient will improve TUG to under 12 second to improve dynamic balance and decrease fall risk.    Baseline  25 sec with use of rollator; 12/27/18 14.4 sec with use of SPC    Time  6    Period  Weeks    Status  On-going    Target Date  01/17/19      PT LONG TERM GOAL #3   Title  Patient will improve 5xSTS to under 12 second from standard surface to improve functional strength and decrease fall risk.    Baseline  25 sec from 21" surface; 12/27/2018 19.5 sec    Time  6    Period  Weeks    Status  On-going      PT LONG TERM GOAL #4   Title  Patient will improve 60mwt to over .69m/s to improve dynamic balance and decrease fall risk.    Baseline  .4 m/s; 12/27/2018 0.78 sec    Time  6    Period  Weeks    Status  On-going      PT LONG TERM GOAL #5   Title  Patient will improve 2MWT to 275 feet to demonstrate increase in functional capacity     Baseline  12/27/2018 210 ft    Time  6    Period  Weeks    Status  New    Target Date  02/07/19            Plan - 01/10/19 1228    Clinical Impression Statement  Monitored vitals closely this session with activity and securing pulse ox to finger via tape. Vital response to exercise WNL given patient's age and pharmacological environment but note pleth suggestive of afib. Patient demonstrated improved activity tolerance this session which may be due to taking albuterol prior to session improving O2. Note reduced guarding required compared to prior sessions with good carryover for STS technique. Patient will benefit from additional physical therapy to improve activity tolerance and return to PLOF.    Personal Factors and Comorbidities  Age;Comorbidity 3+;Other    Comorbidities  DOE, MIx2, COPD    Examination-Activity Limitations  Bathing;Squat;Lift;Bend;Stand;Carry    Examination-Participation Restrictions  Laundry;Shop;Meal Prep;Yard Work;Cleaning    Stability/Clinical Decision Making  Unstable/Unpredictable    Rehab Potential  Fair    PT Frequency  2x / week    PT Duration  6 weeks    PT Treatment/Interventions  Therapeutic activities;Therapeutic exercise;Cryotherapy;Moist Heat;Stair training;Gait training;Balance training;Neuromuscular re-education;Patient/family education;Passive range of motion;Joint Manipulations;Manual techniques    PT Next Visit Plan  progress strengthening techniques    PT Home Exercise Plan  See education section    Consulted and Agree with Plan of Care  Patient;Family member/caregiver    Family Member Consulted  Daughter       Patient will benefit from skilled therapeutic intervention in order to improve the following deficits and impairments:  Abnormal gait, Decreased balance, Decreased endurance, Decreased mobility, Difficulty walking, Increased muscle spasms, Decreased range of motion, Decreased activity tolerance, Decreased coordination, Decreased strength,  Hypermobility, Pain, Postural dysfunction  Visit Diagnosis: Muscle weakness (generalized)  History of falling     Problem List Patient Active Problem List   Diagnosis Date Noted  . GI bleed 08/18/2015    Virgia Land, SPT 01/10/2019, 12:39 PM  [place] [facility] [address], [place], [address] Phone: [phone]   Fax:  [phone]  Name: Sheryl Suarez MRN: [id number] Date of Birth: 06-04-1923

## 2019-01-15 ENCOUNTER — Other Ambulatory Visit: Payer: Self-pay

## 2019-01-15 ENCOUNTER — Emergency Department: Payer: Medicare Other

## 2019-01-15 ENCOUNTER — Encounter: Payer: Self-pay | Admitting: Emergency Medicine

## 2019-01-15 ENCOUNTER — Inpatient Hospital Stay
Admission: EM | Admit: 2019-01-15 | Discharge: 2019-01-21 | DRG: 511 | Disposition: A | Discharge status: Skilled Nursing Facility | Payer: Medicare Other | Attending: Internal Medicine | Admitting: Internal Medicine

## 2019-01-15 DIAGNOSIS — M6282 Rhabdomyolysis: Secondary | ICD-10-CM | POA: Diagnosis present

## 2019-01-15 DIAGNOSIS — I5022 Chronic systolic (congestive) heart failure: Secondary | ICD-10-CM | POA: Diagnosis present

## 2019-01-15 DIAGNOSIS — Z20828 Contact with and (suspected) exposure to other viral communicable diseases: Secondary | ICD-10-CM | POA: Diagnosis present

## 2019-01-15 DIAGNOSIS — R531 Weakness: Secondary | ICD-10-CM | POA: Diagnosis present

## 2019-01-15 DIAGNOSIS — Z9049 Acquired absence of other specified parts of digestive tract: Secondary | ICD-10-CM

## 2019-01-15 DIAGNOSIS — E785 Hyperlipidemia, unspecified: Secondary | ICD-10-CM | POA: Diagnosis present

## 2019-01-15 DIAGNOSIS — I2510 Atherosclerotic heart disease of native coronary artery without angina pectoris: Secondary | ICD-10-CM | POA: Diagnosis present

## 2019-01-15 DIAGNOSIS — D509 Iron deficiency anemia, unspecified: Secondary | ICD-10-CM | POA: Diagnosis present

## 2019-01-15 DIAGNOSIS — S42401A Unspecified fracture of lower end of right humerus, initial encounter for closed fracture: Secondary | ICD-10-CM | POA: Diagnosis present

## 2019-01-15 DIAGNOSIS — Y92000 Kitchen of unspecified non-institutional (private) residence as  the place of occurrence of the external cause: Secondary | ICD-10-CM | POA: Diagnosis not present

## 2019-01-15 DIAGNOSIS — Z9882 Breast implant status: Secondary | ICD-10-CM

## 2019-01-15 DIAGNOSIS — Z853 Personal history of malignant neoplasm of breast: Secondary | ICD-10-CM

## 2019-01-15 DIAGNOSIS — Z7982 Long term (current) use of aspirin: Secondary | ICD-10-CM | POA: Diagnosis not present

## 2019-01-15 DIAGNOSIS — I255 Ischemic cardiomyopathy: Secondary | ICD-10-CM | POA: Diagnosis present

## 2019-01-15 DIAGNOSIS — S2239XA Fracture of one rib, unspecified side, initial encounter for closed fracture: Secondary | ICD-10-CM | POA: Diagnosis present

## 2019-01-15 DIAGNOSIS — T796XXA Traumatic ischemia of muscle, initial encounter: Secondary | ICD-10-CM | POA: Diagnosis not present

## 2019-01-15 DIAGNOSIS — S2241XA Multiple fractures of ribs, right side, initial encounter for closed fracture: Secondary | ICD-10-CM | POA: Diagnosis present

## 2019-01-15 DIAGNOSIS — I25810 Atherosclerosis of coronary artery bypass graft(s) without angina pectoris: Secondary | ICD-10-CM | POA: Diagnosis not present

## 2019-01-15 DIAGNOSIS — Z7951 Long term (current) use of inhaled steroids: Secondary | ICD-10-CM | POA: Diagnosis not present

## 2019-01-15 DIAGNOSIS — Z951 Presence of aortocoronary bypass graft: Secondary | ICD-10-CM | POA: Diagnosis not present

## 2019-01-15 DIAGNOSIS — Z9861 Coronary angioplasty status: Secondary | ICD-10-CM

## 2019-01-15 DIAGNOSIS — Z23 Encounter for immunization: Secondary | ICD-10-CM | POA: Diagnosis present

## 2019-01-15 DIAGNOSIS — W19XXXA Unspecified fall, initial encounter: Secondary | ICD-10-CM

## 2019-01-15 DIAGNOSIS — S20212A Contusion of left front wall of thorax, initial encounter: Secondary | ICD-10-CM | POA: Diagnosis present

## 2019-01-15 DIAGNOSIS — I482 Chronic atrial fibrillation, unspecified: Secondary | ICD-10-CM | POA: Diagnosis present

## 2019-01-15 DIAGNOSIS — R296 Repeated falls: Secondary | ICD-10-CM | POA: Diagnosis present

## 2019-01-15 DIAGNOSIS — D539 Nutritional anemia, unspecified: Secondary | ICD-10-CM | POA: Diagnosis present

## 2019-01-15 DIAGNOSIS — Z90710 Acquired absence of both cervix and uterus: Secondary | ICD-10-CM

## 2019-01-15 DIAGNOSIS — Z79899 Other long term (current) drug therapy: Secondary | ICD-10-CM | POA: Diagnosis not present

## 2019-01-15 DIAGNOSIS — S52021A Displaced fracture of olecranon process without intraarticular extension of right ulna, initial encounter for closed fracture: Secondary | ICD-10-CM | POA: Diagnosis present

## 2019-01-15 DIAGNOSIS — I480 Paroxysmal atrial fibrillation: Secondary | ICD-10-CM | POA: Diagnosis present

## 2019-01-15 DIAGNOSIS — I959 Hypotension, unspecified: Secondary | ICD-10-CM | POA: Diagnosis present

## 2019-01-15 DIAGNOSIS — Z9013 Acquired absence of bilateral breasts and nipples: Secondary | ICD-10-CM | POA: Diagnosis not present

## 2019-01-15 DIAGNOSIS — Z8249 Family history of ischemic heart disease and other diseases of the circulatory system: Secondary | ICD-10-CM | POA: Diagnosis not present

## 2019-01-15 DIAGNOSIS — I10 Essential (primary) hypertension: Secondary | ICD-10-CM | POA: Diagnosis not present

## 2019-01-15 DIAGNOSIS — Z66 Do not resuscitate: Secondary | ICD-10-CM | POA: Diagnosis present

## 2019-01-15 DIAGNOSIS — Z9181 History of falling: Secondary | ICD-10-CM

## 2019-01-15 DIAGNOSIS — I252 Old myocardial infarction: Secondary | ICD-10-CM

## 2019-01-15 LAB — CBC WITH DIFFERENTIAL/PLATELET
Abs Immature Granulocytes: 0.02 10*3/uL (ref 0.00–0.07)
Basophils Absolute: 0 10*3/uL (ref 0.0–0.1)
Basophils Relative: 0 %
Eosinophils Absolute: 0 10*3/uL (ref 0.0–0.5)
Eosinophils Relative: 0 %
HCT: 37.4 % (ref 36.0–46.0)
Hemoglobin: 11.5 g/dL — ABNORMAL LOW (ref 12.0–15.0)
Immature Granulocytes: 0 %
Lymphocytes Relative: 10 %
Lymphs Abs: 0.9 10*3/uL (ref 0.7–4.0)
MCH: 30.7 pg (ref 26.0–34.0)
MCHC: 30.7 g/dL (ref 30.0–36.0)
MCV: 100 fL (ref 80.0–100.0)
Monocytes Absolute: 0.8 10*3/uL (ref 0.1–1.0)
Monocytes Relative: 9 %
Neutro Abs: 7.3 10*3/uL (ref 1.7–7.7)
Neutrophils Relative %: 81 %
Platelets: 108 10*3/uL — ABNORMAL LOW (ref 150–400)
RBC: 3.74 MIL/uL — ABNORMAL LOW (ref 3.87–5.11)
RDW: 14.9 % (ref 11.5–15.5)
WBC: 9 10*3/uL (ref 4.0–10.5)
nRBC: 0 % (ref 0.0–0.2)

## 2019-01-15 LAB — URINALYSIS, COMPLETE (UACMP) WITH MICROSCOPIC
Bilirubin Urine: NEGATIVE
Glucose, UA: NEGATIVE mg/dL
Hgb urine dipstick: NEGATIVE
Ketones, ur: 5 mg/dL — AB
Leukocytes,Ua: NEGATIVE
Nitrite: NEGATIVE
Protein, ur: 30 mg/dL — AB
Specific Gravity, Urine: 1.021 (ref 1.005–1.030)
pH: 5 (ref 5.0–8.0)

## 2019-01-15 LAB — COMPREHENSIVE METABOLIC PANEL
ALT: 34 U/L (ref 0–44)
AST: 65 U/L — ABNORMAL HIGH (ref 15–41)
Albumin: 4 g/dL (ref 3.5–5.0)
Alkaline Phosphatase: 119 U/L (ref 38–126)
Anion gap: 12 (ref 5–15)
BUN: 42 mg/dL — ABNORMAL HIGH (ref 8–23)
CO2: 24 mmol/L (ref 22–32)
Calcium: 9.7 mg/dL (ref 8.9–10.3)
Chloride: 105 mmol/L (ref 98–111)
Creatinine, Ser: 0.98 mg/dL (ref 0.44–1.00)
GFR calc Af Amer: 58 mL/min — ABNORMAL LOW (ref >60)
GFR calc non Af Amer: 50 mL/min — ABNORMAL LOW (ref >60)
Glucose, Bld: 99 mg/dL (ref 70–99)
Potassium: 4.2 mmol/L (ref 3.5–5.1)
Sodium: 141 mmol/L (ref 135–145)
Total Bilirubin: 1.8 mg/dL — ABNORMAL HIGH (ref 0.3–1.2)
Total Protein: 7.2 g/dL (ref 6.5–8.1)

## 2019-01-15 LAB — TROPONIN I (HIGH SENSITIVITY)
Troponin I (High Sensitivity): 31 ng/L — ABNORMAL HIGH (ref <18)
Troponin I (High Sensitivity): 42 ng/L — ABNORMAL HIGH (ref <18)

## 2019-01-15 LAB — CK: Total CK: 1926 U/L — ABNORMAL HIGH (ref 38–234)

## 2019-01-15 LAB — PROTIME-INR
INR: 1.3 — ABNORMAL HIGH (ref 0.8–1.2)
Prothrombin Time: 15.9 seconds — ABNORMAL HIGH (ref 11.4–15.2)

## 2019-01-15 IMAGING — CT CT HEAD W/O CM
4 series · 16 of 47 positions shown, 18 images · non-contrast
Comparison: None.

CLINICAL DATA: Multiple falls, head trauma

EXAM:
CT HEAD WITHOUT CONTRAST
TECHNIQUE: Contiguous axial images were obtained from the base of the skull
through the vertex without intravenous contrast.

[Series 2: head wo · axial · 0.47mm/px · z∈[+1064,+1164]mm · 6 of 29 slices shown, 8 images]
[im 5/29  brain]
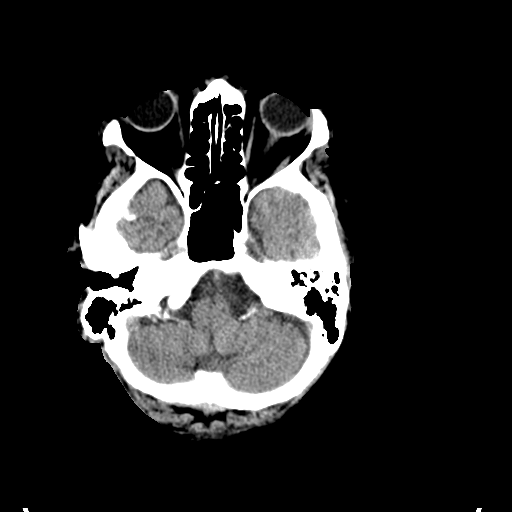
[im 5/29  bone]
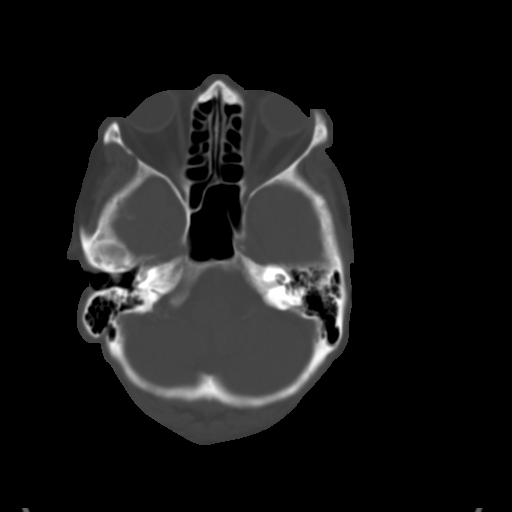
[im 9/29  brain]
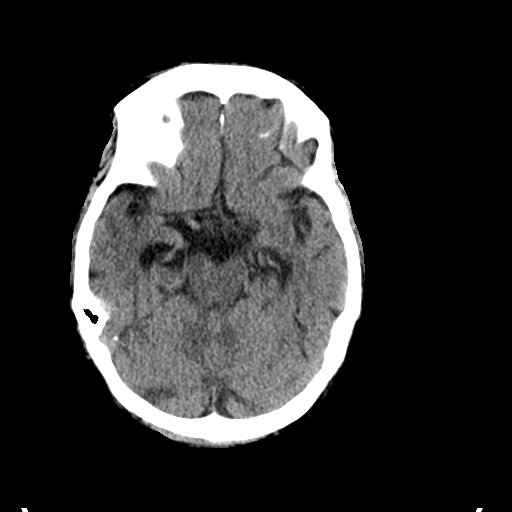
[im 13/29  brain]
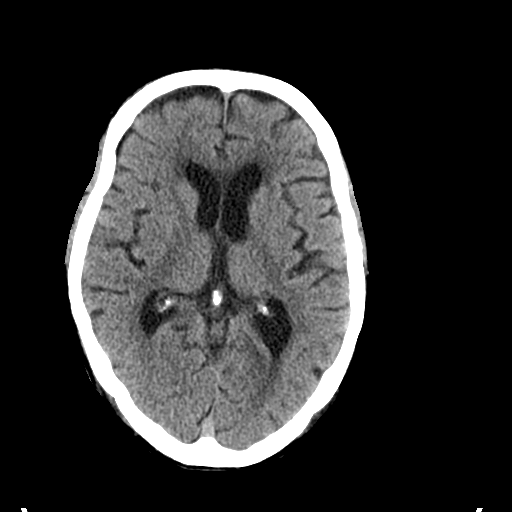
[im 17/29  brain]
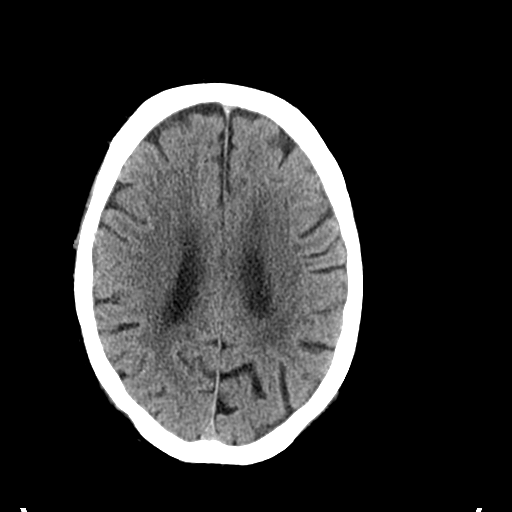
[im 21/29  brain]
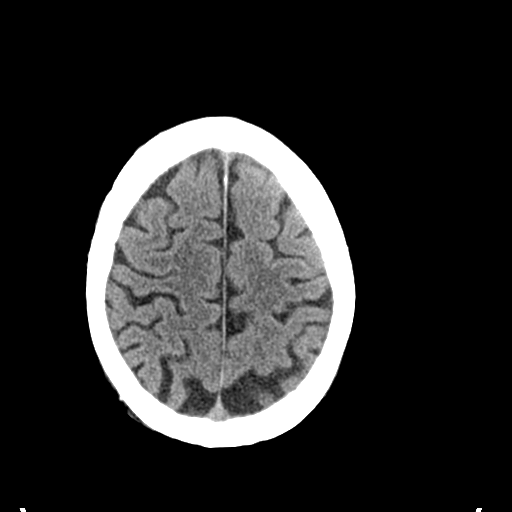
[im 21/29  bone]
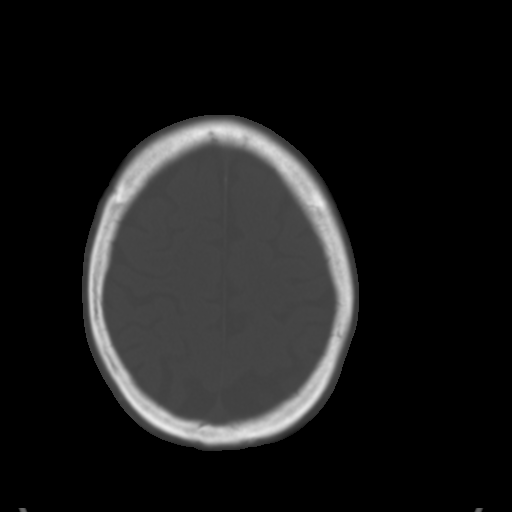
[im 25/29  brain]
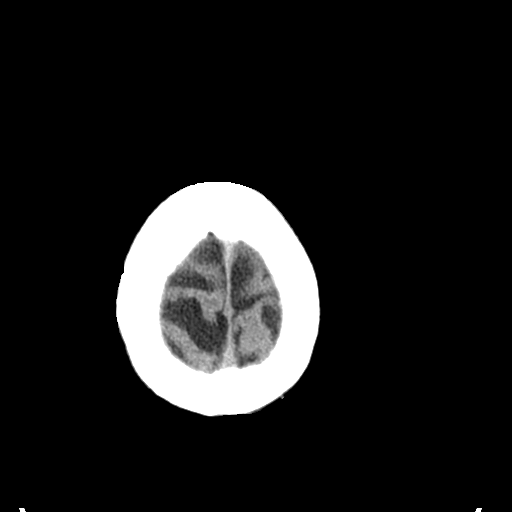

[Series 3: head bone · axial · 0.47mm/px · z∈[+1056,+1106]mm · 4 of 74 slices shown]
[im 7/74  bone]
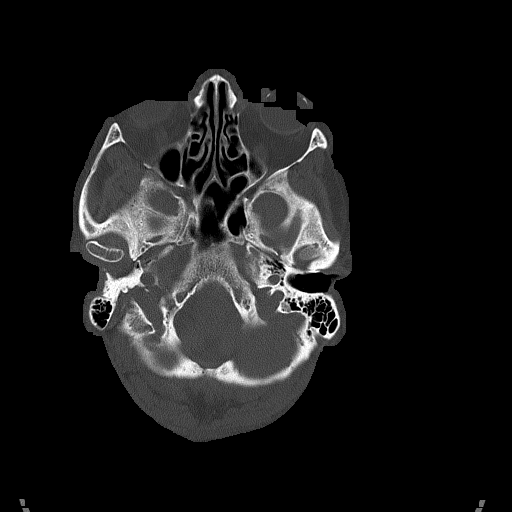
[im 14/74  bone]
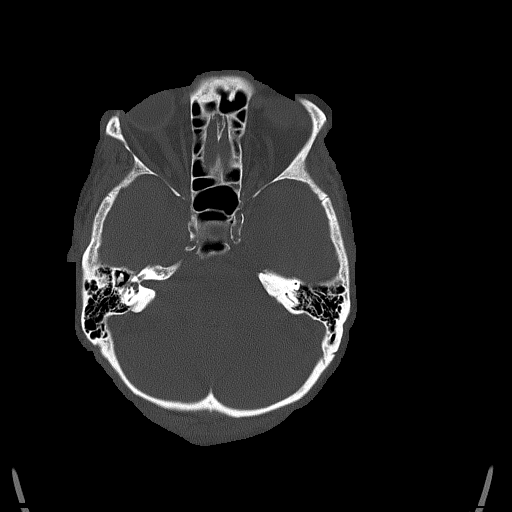
[im 25/74  bone]
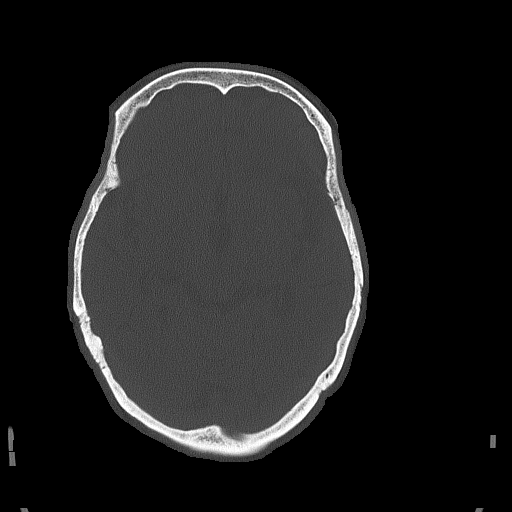
[im 32/74  bone]
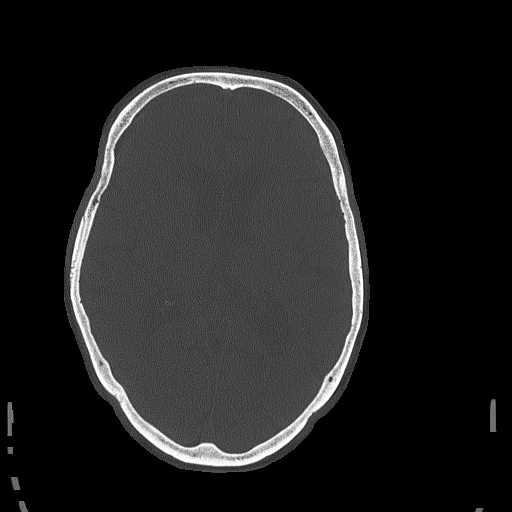

[Series 4: coronal soft tissue · coronal · 0.29mm/px · 3 of 66 slices shown]
[im 22/66  brain]
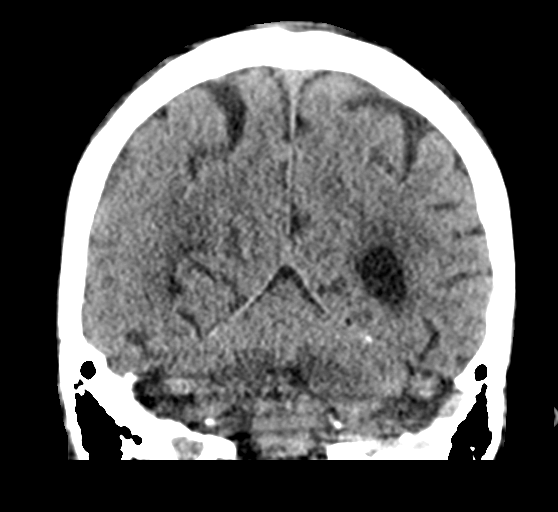
[im 29/66  brain]
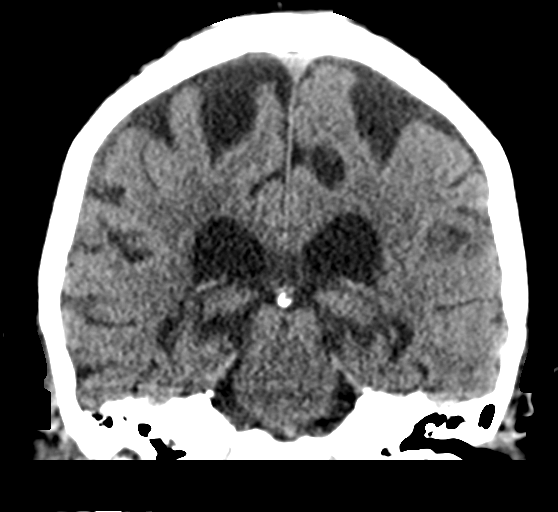
[im 37/66  brain]
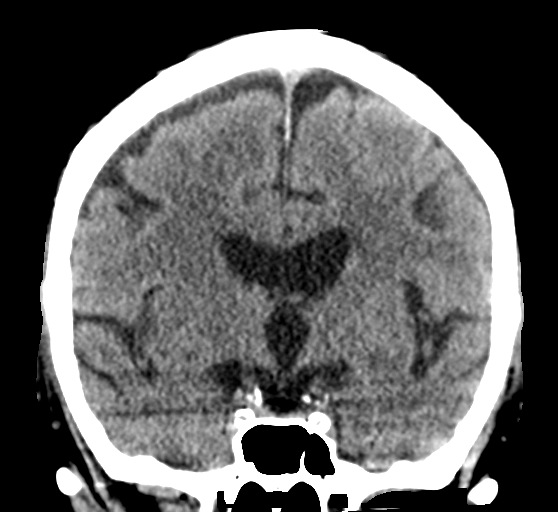

[Series 5: sagittal soft tissue · sagittal · 0.29mm/px · 3 of 54 slices shown]
[im 18/54  brain]
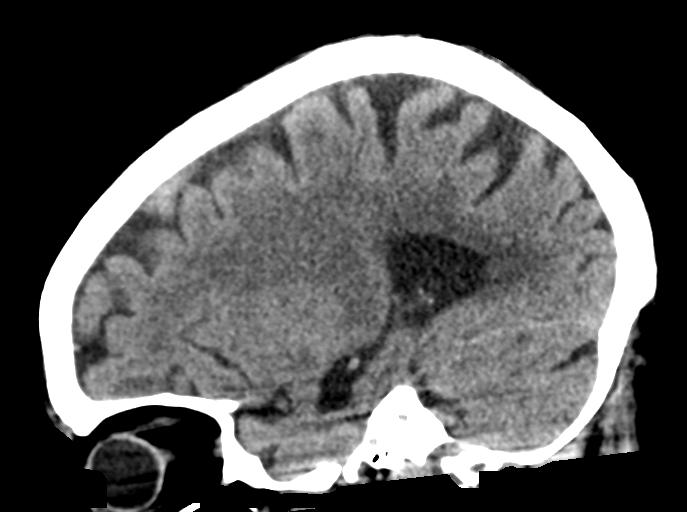
[im 27/54  brain]
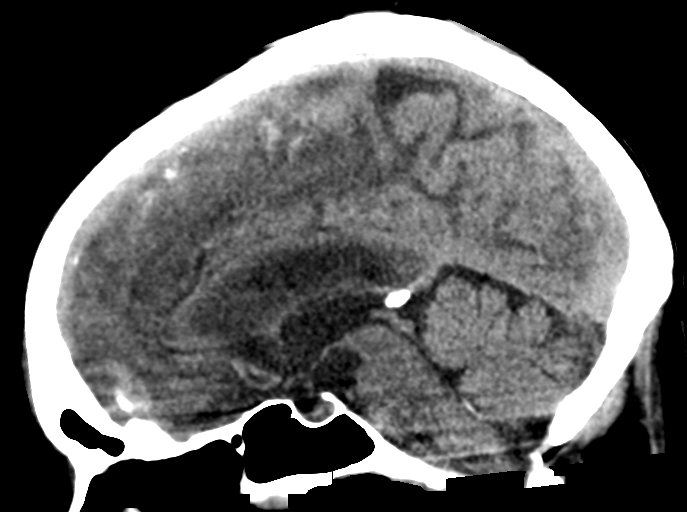
[im 36/54  brain]
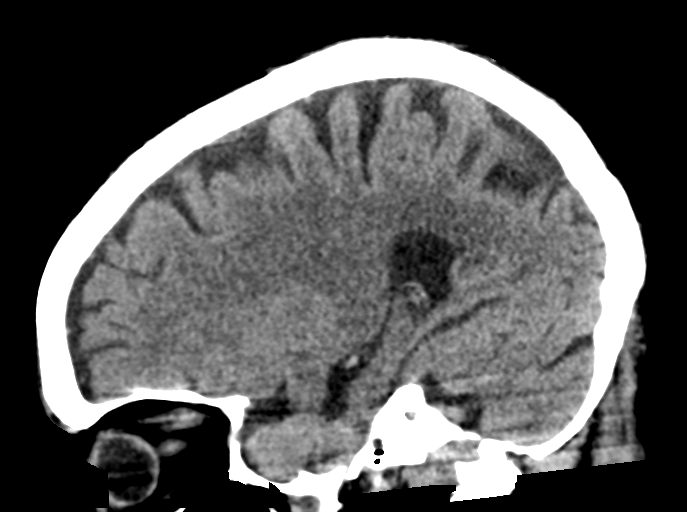

[16 of 47 positions shown; findings below may reference images not displayed]

FINDINGS: Brain: No evidence of acute infarction, hemorrhage, hydrocephalus,
extra-axial collection or mass lesion/mass effect. Scattered
low-density changes within the periventricular and subcortical white
matter compatible with chronic microvascular ischemic change. Mild
diffuse cerebral volume loss.

Vascular: Mild atherosclerotic calcifications involving the large
vessels of the skull base. No unexpected hyperdense vessel.

Skull: Normal. Negative for fracture or focal lesion.

Sinuses/Orbits: No acute finding.

Other: None.
IMPRESSION: 1.  No acute intracranial findings.

2.  Chronic microvascular ischemic change and cerebral volume loss.

## 2019-01-15 IMAGING — CR DG ELBOW 2V*R*
2 series · 2 of 2 positions shown · non-contrast
Comparison: None.

CLINICAL DATA: Right elbow pain secondary to a fall last night.

EXAM:
RIGHT ELBOW - 2 VIEW

[elbow ap]
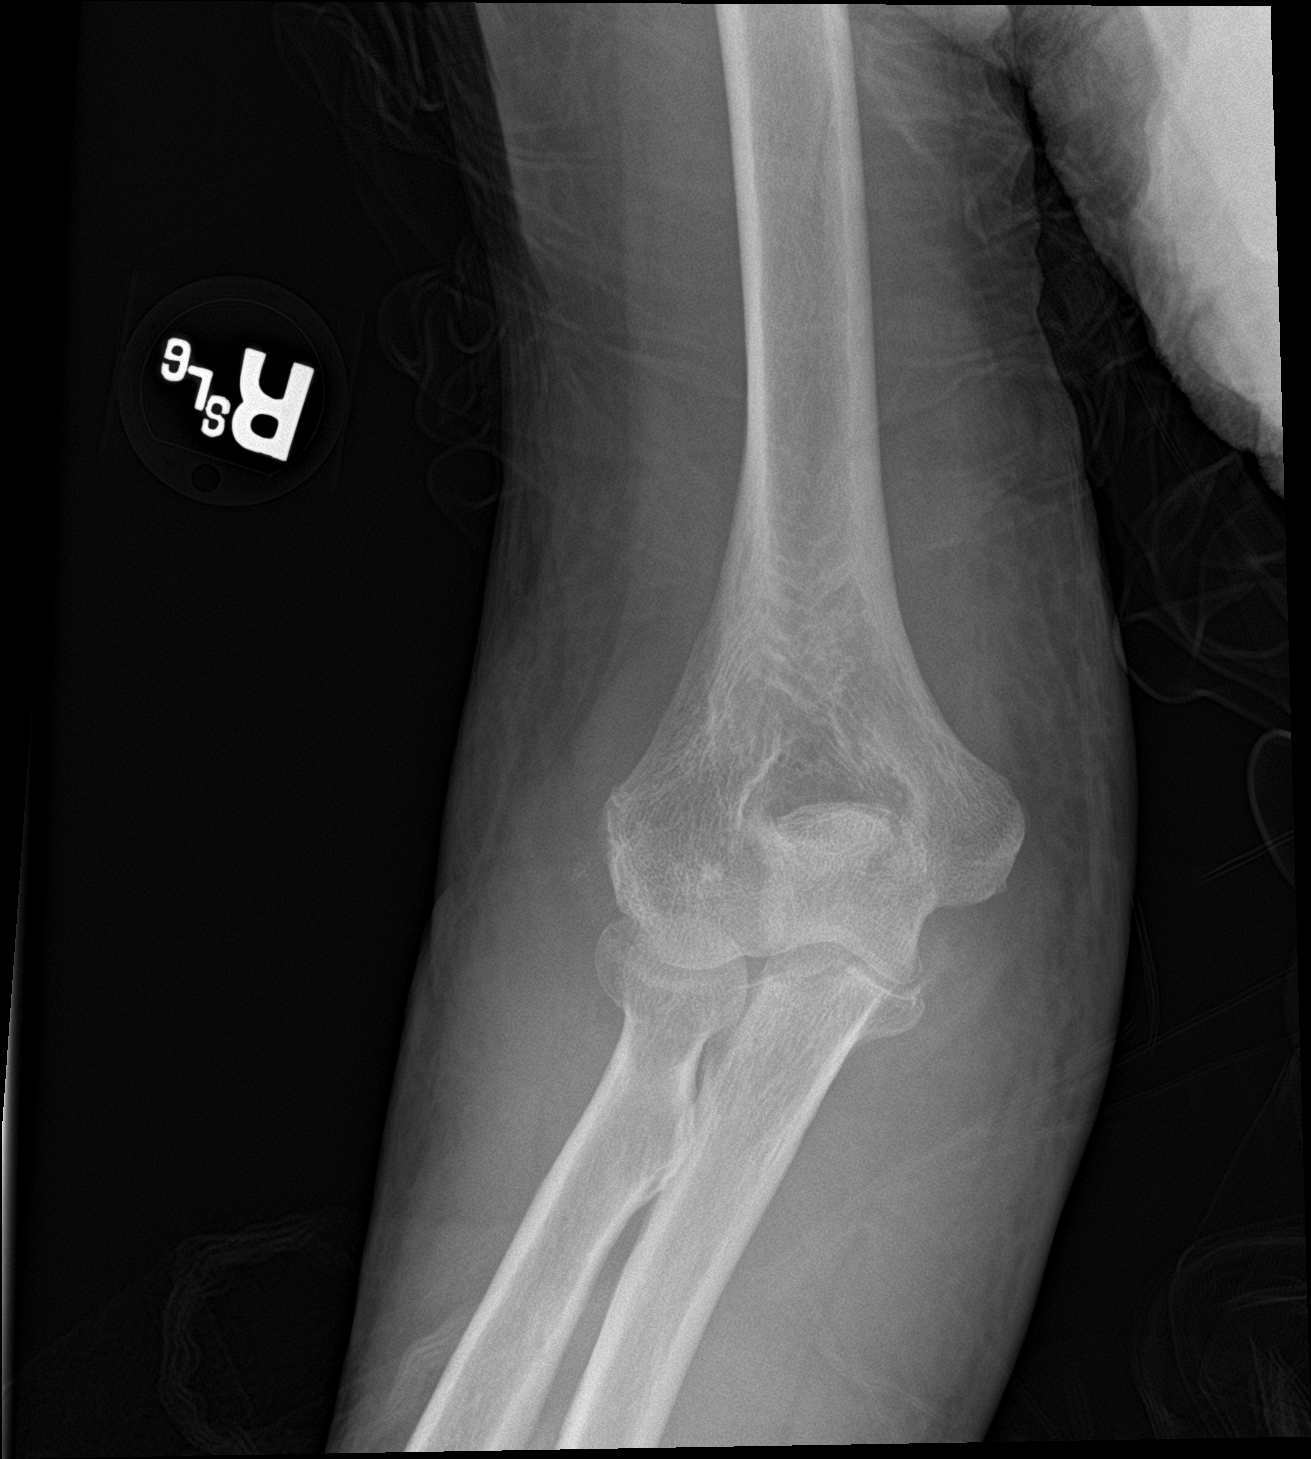

[elbow lat]
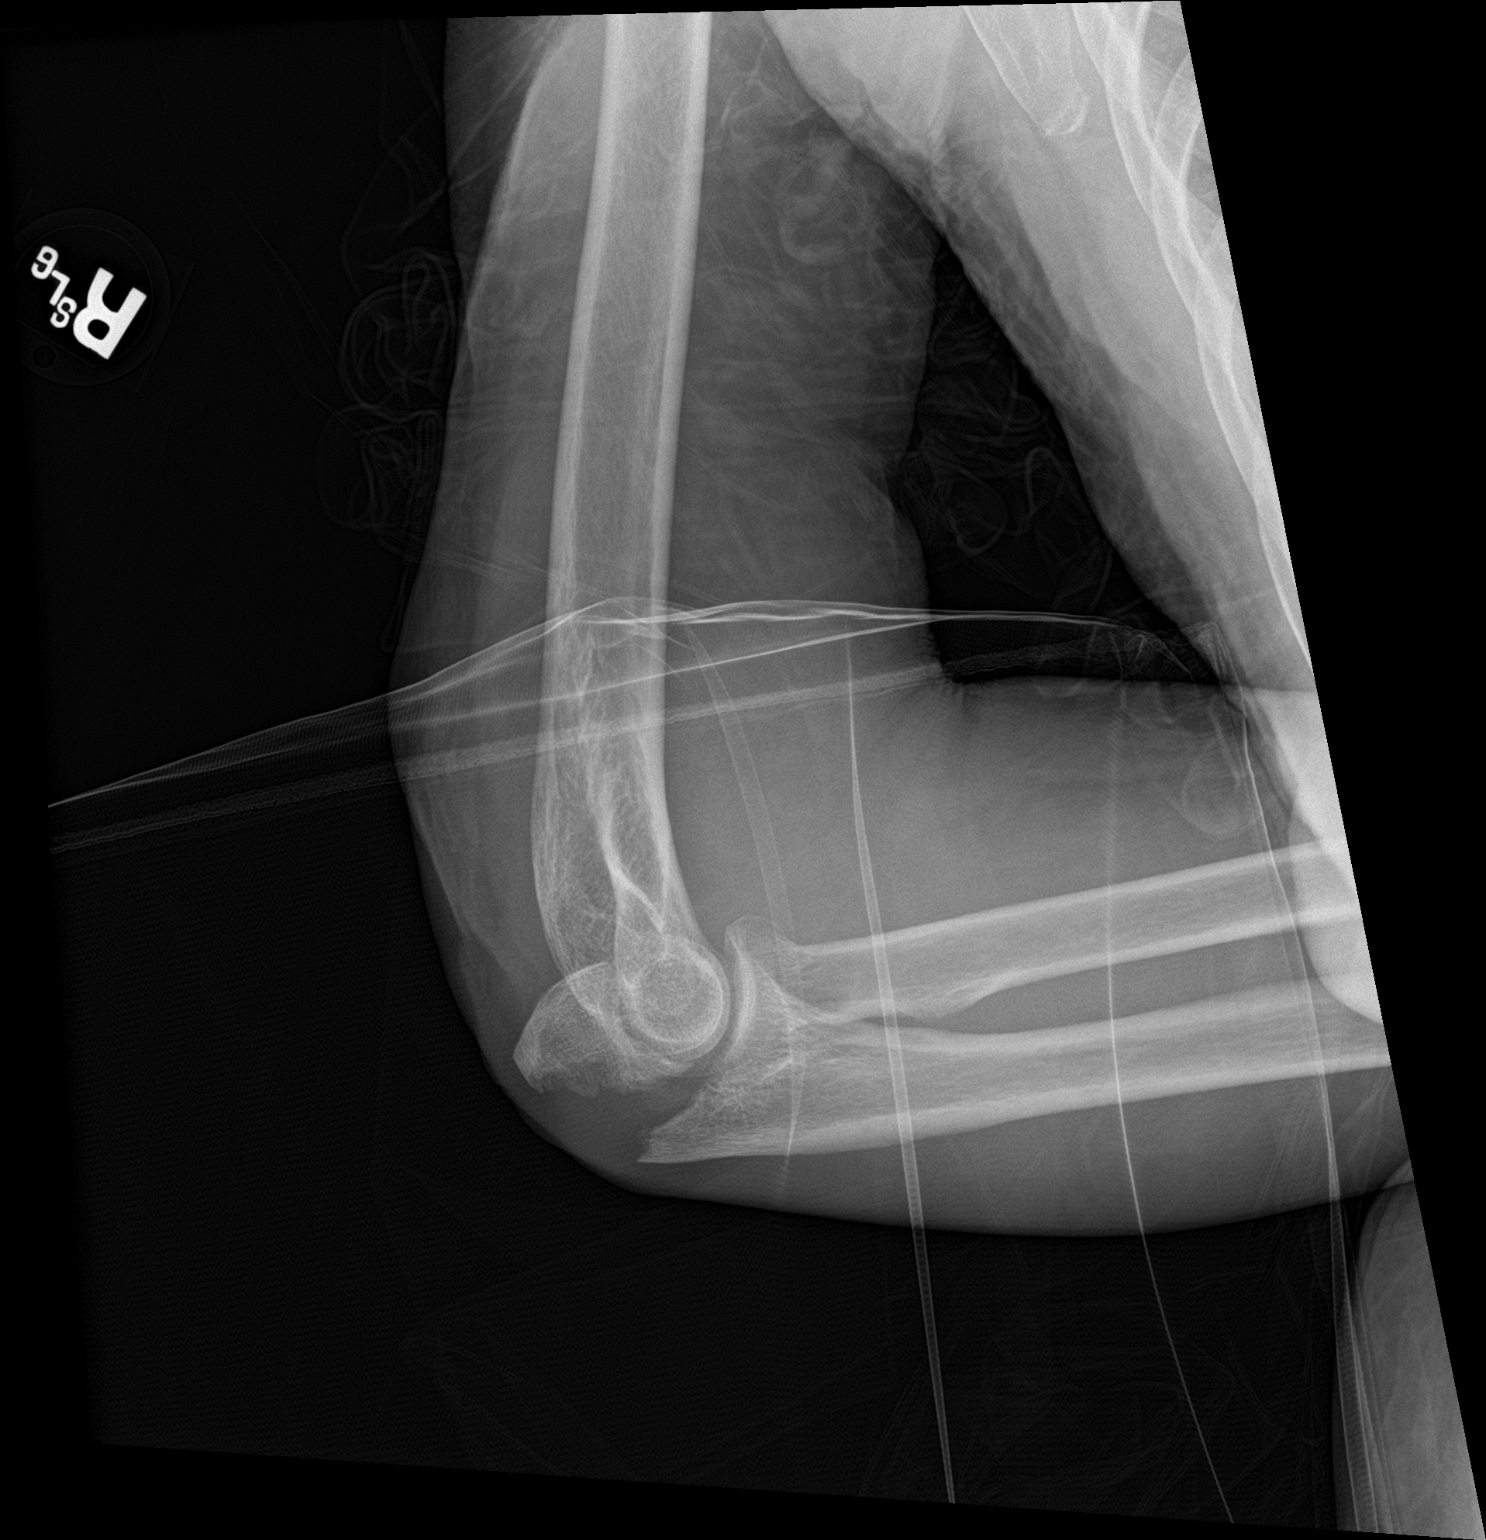

[2 of 2 positions shown; findings below may reference images not displayed]

FINDINGS: There is a distracted fracture of the olecranon process of the
proximal ulna. Hemarthrosis. Distal humerus and proximal radius are
intact.
IMPRESSION: 1. Acute distracted fracture of the olecranon process of the
proximal ulna.
2. Hemarthrosis.

## 2019-01-15 IMAGING — CR DG CHEST 2V
2 series · 2 of 2 positions shown · non-contrast
Comparison: None.

CLINICAL DATA: Multiple trauma secondary to a fall last night.
Bruising to the right side of the chest and right shoulder.

EXAM:
CHEST - 2 VIEW

[chest lat]
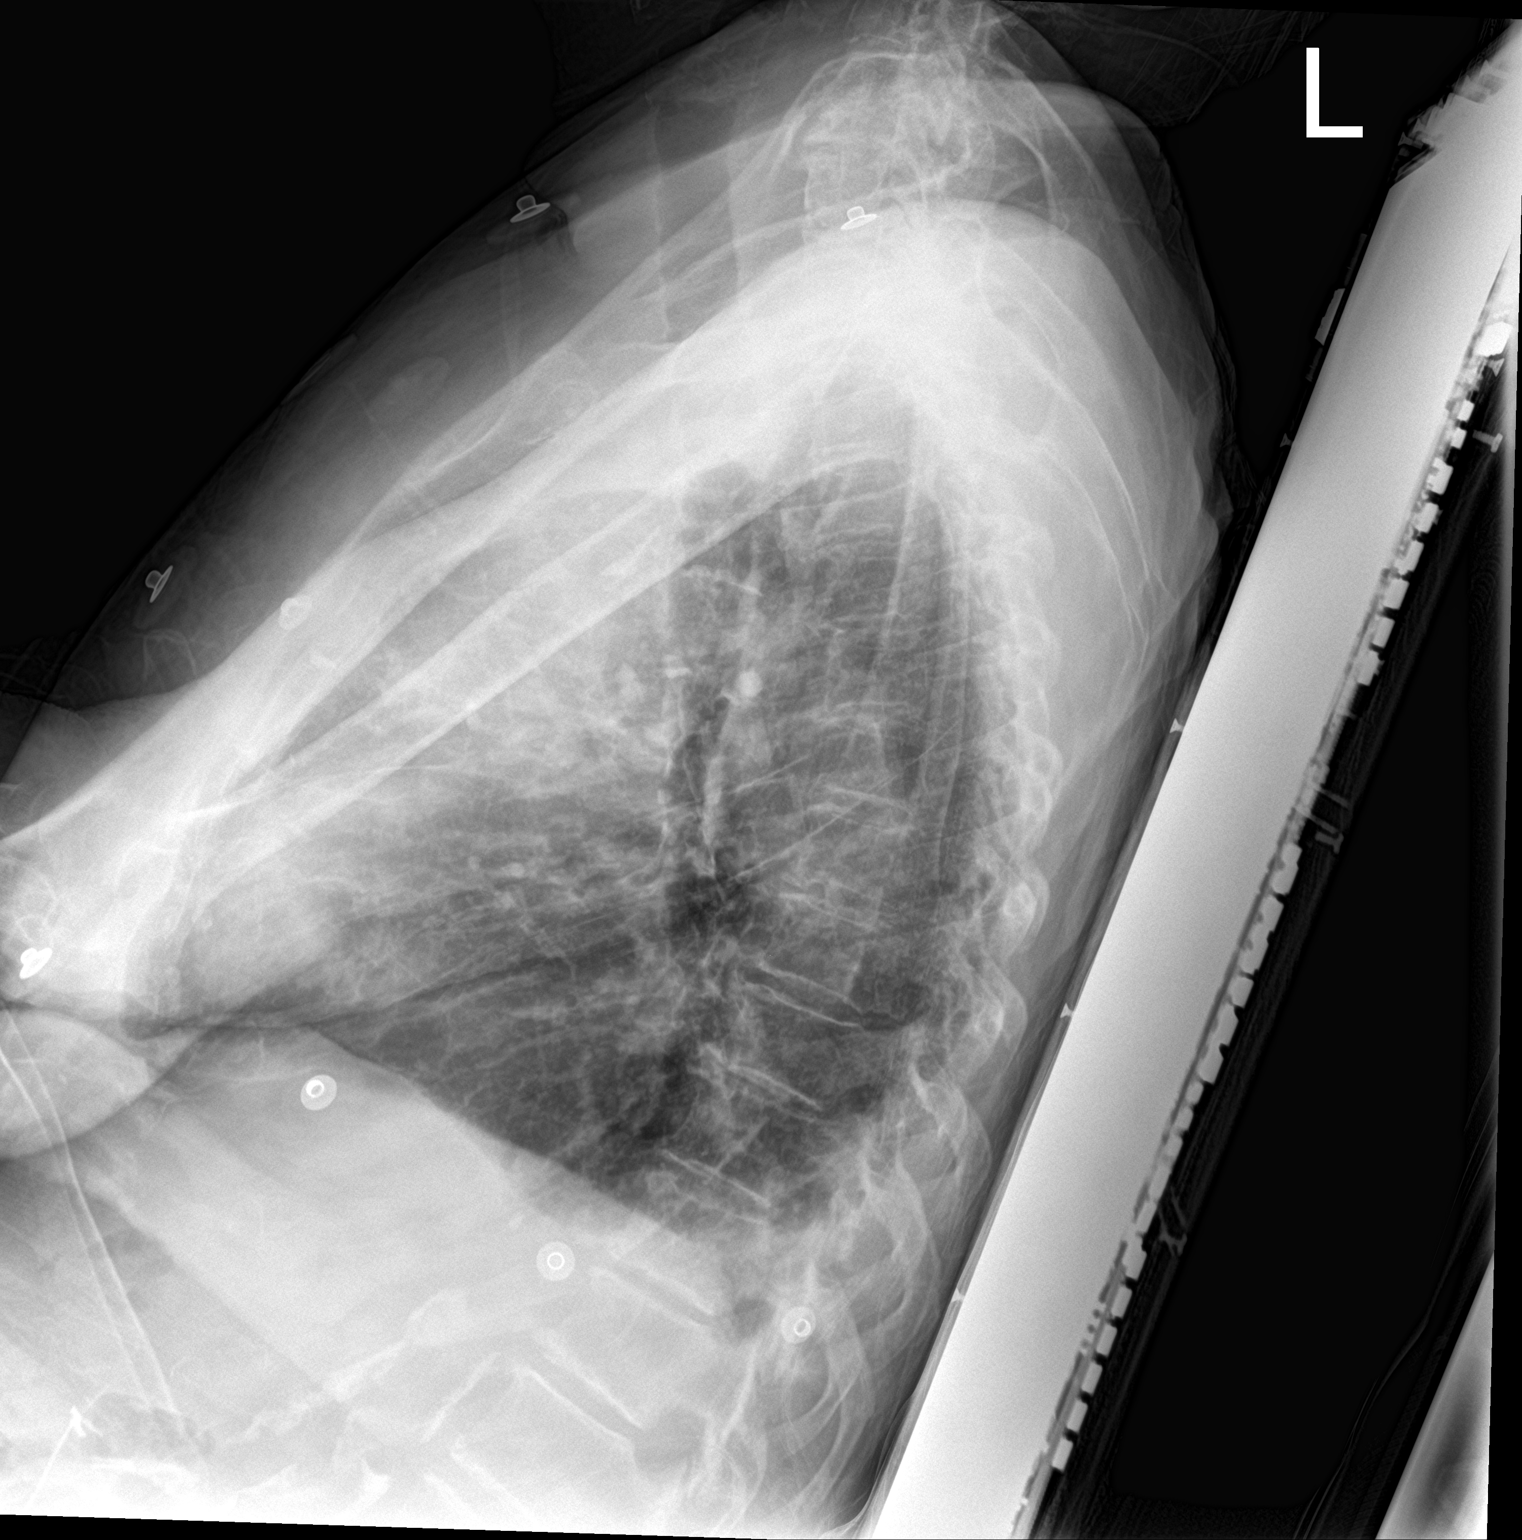

[chest ap]
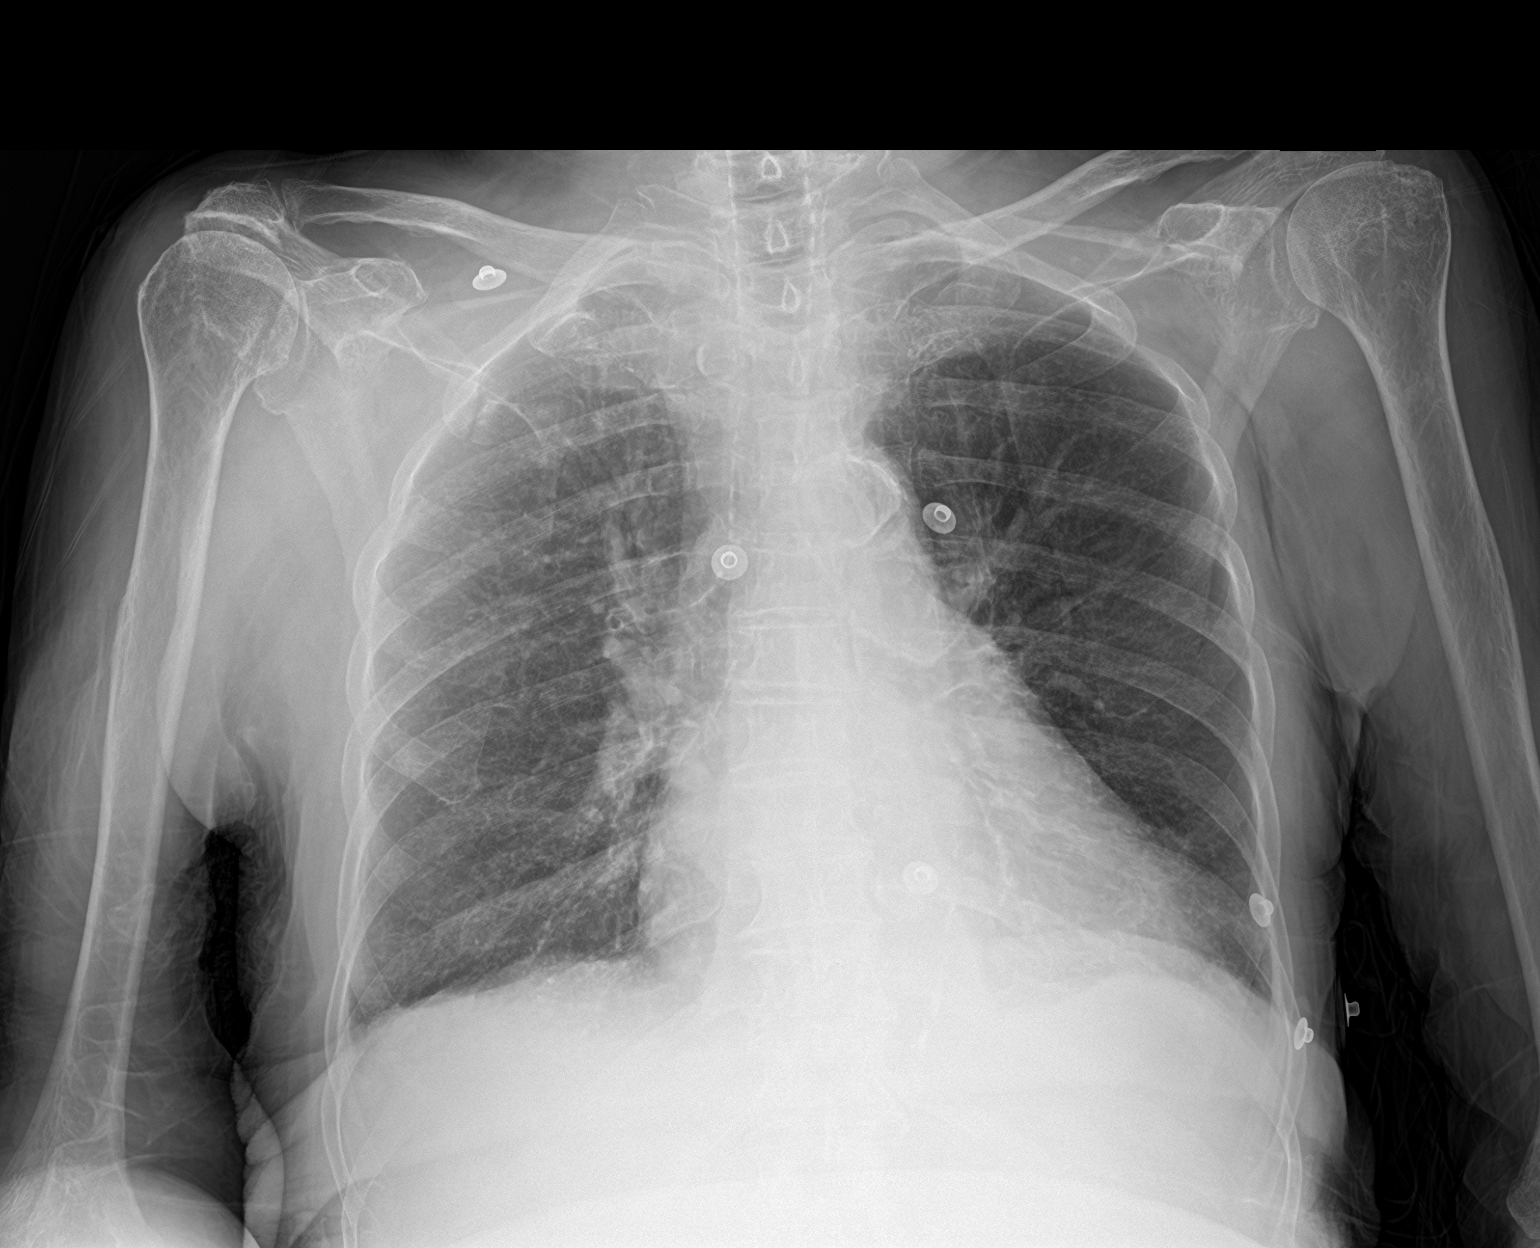

[2 of 2 positions shown; findings below may reference images not displayed]

FINDINGS: There are fractures of the anterolateral aspects of the right second
and third ribs. No other acute bone abnormality. No pneumothorax or
lung contusion or pleural effusion.

Heart size and pulmonary vascularity are normal. Aortic
atherosclerosis. No infiltrates. Chronic degenerative changes of the
right shoulder consistent with chronic rotator cuff tear.
IMPRESSION: Fractures of the anterolateral aspects of the right second and third
ribs. No pneumothorax or pleural effusion.

Aortic Atherosclerosis ([D5]-[D5]).

## 2019-01-15 IMAGING — CT CT CERVICAL SPINE W/O CM
3 of 4 series · 9 of 33 positions shown, 11 images · non-contrast
Comparison: None.

CLINICAL DATA: Neck pain. Multiple falls

EXAM:
CT CERVICAL SPINE WITHOUT CONTRAST
TECHNIQUE: Multidetector CT imaging of the cervical spine was performed without
intravenous contrast. Multiplanar CT image reconstructions were also
generated.

[Series 6: sagittal bone · sagittal · 0.18mm/px · 5 of 45 slices shown, 6 images]
[im 15/45  bone]
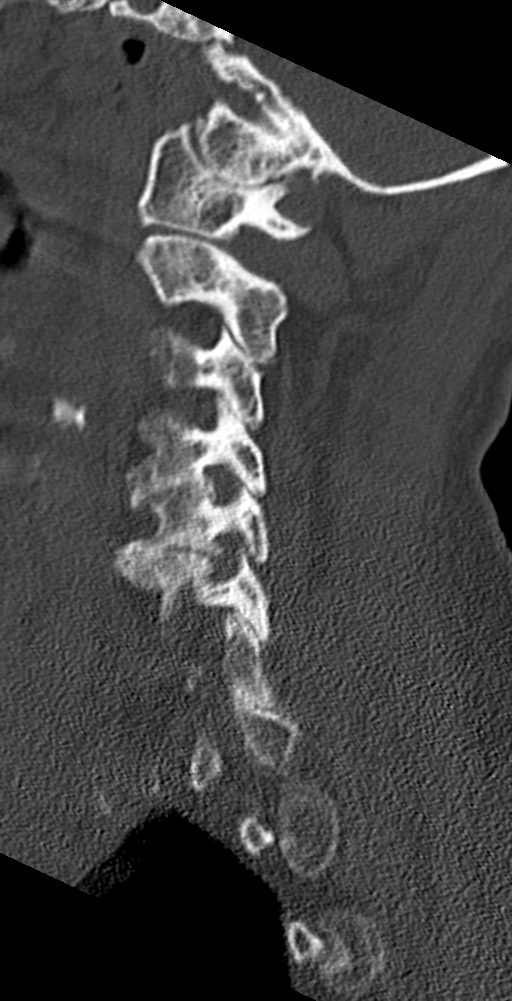
[im 19/45  bone]
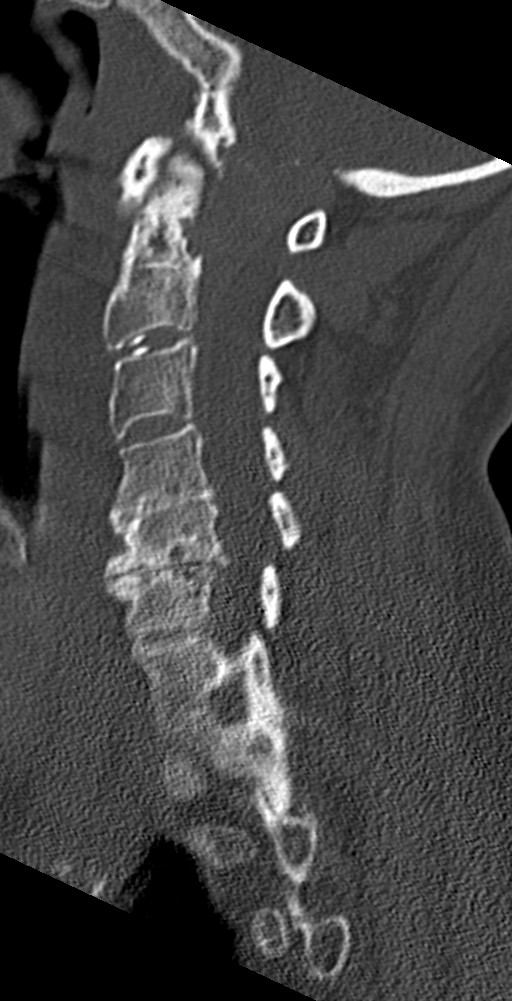
[im 23/45  soft-tissue]
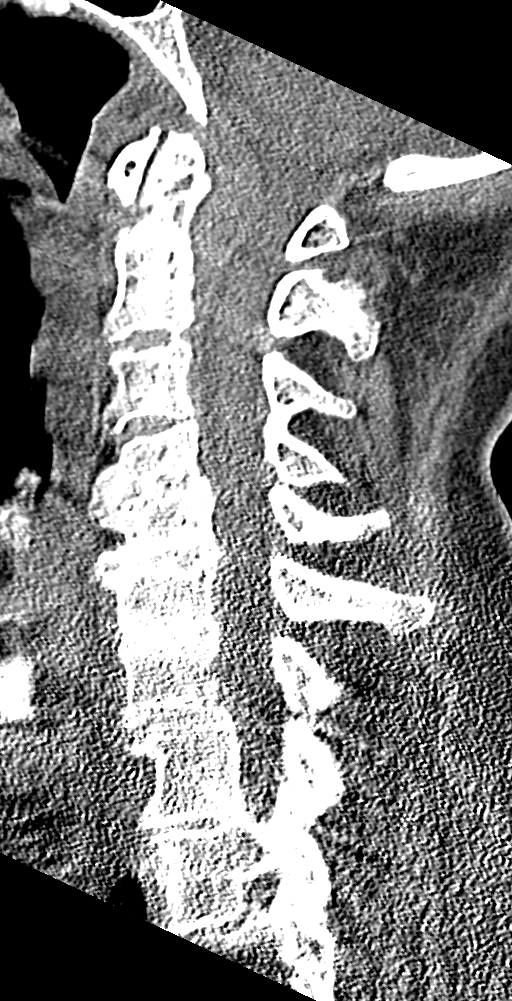
[im 23/45  bone]
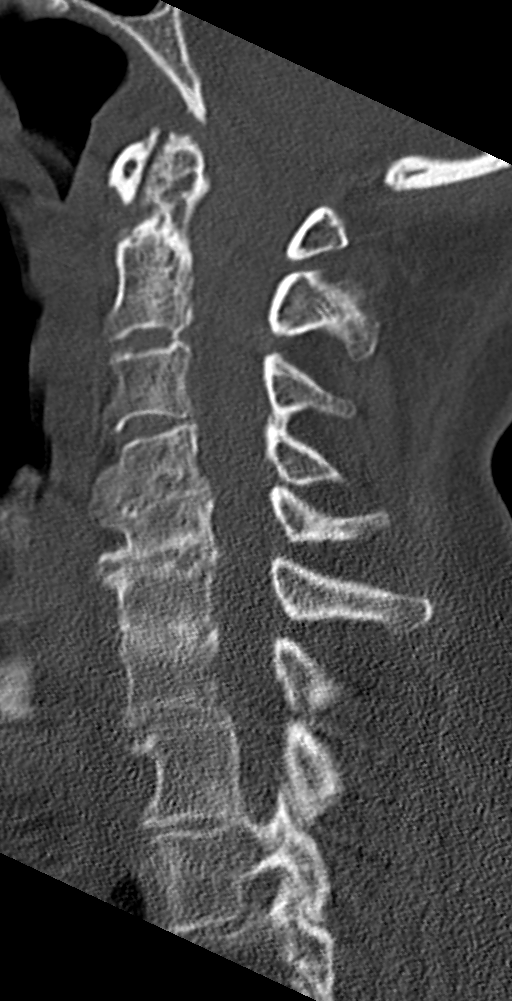
[im 26/45  bone]
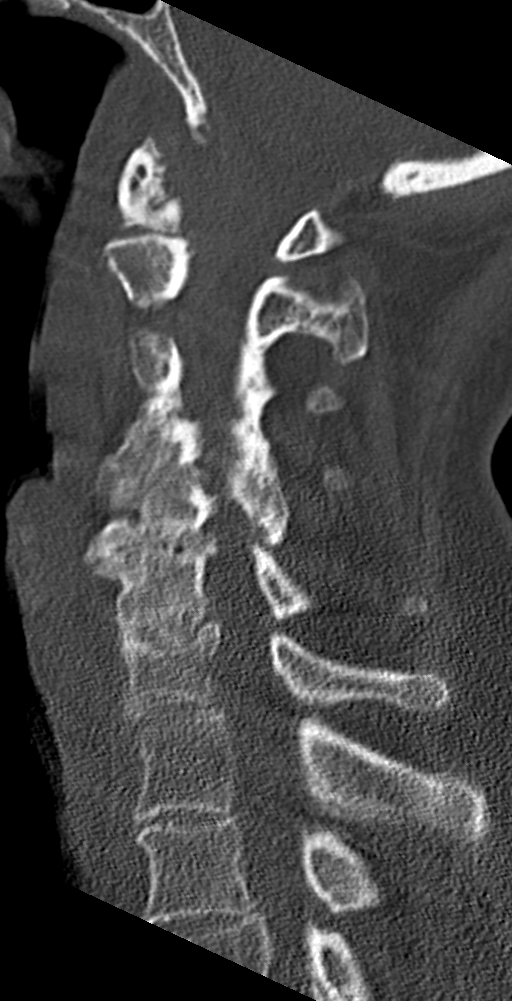
[im 30/45  bone]
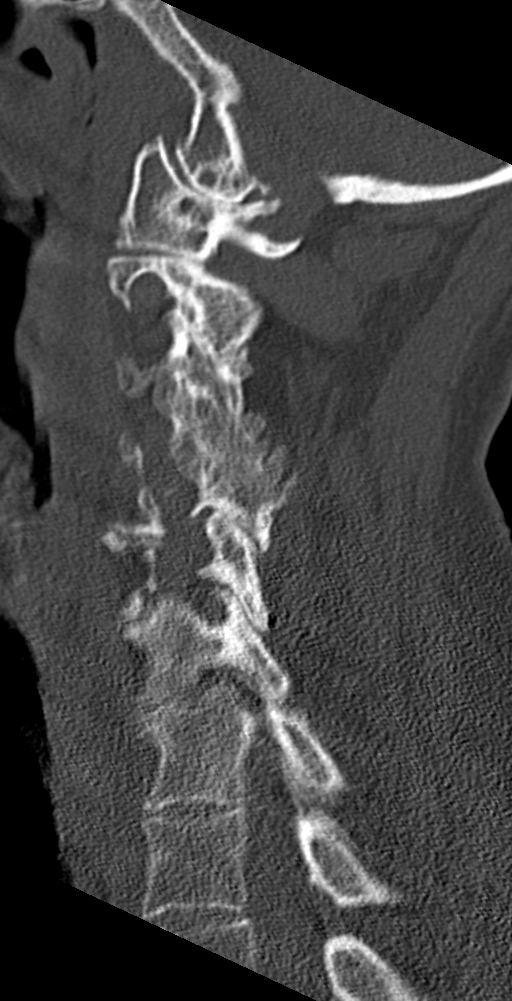

[Series 7: coronal bone · coronal · 0.17mm/px · 3 of 46 slices shown]
[im 10/46  bone]
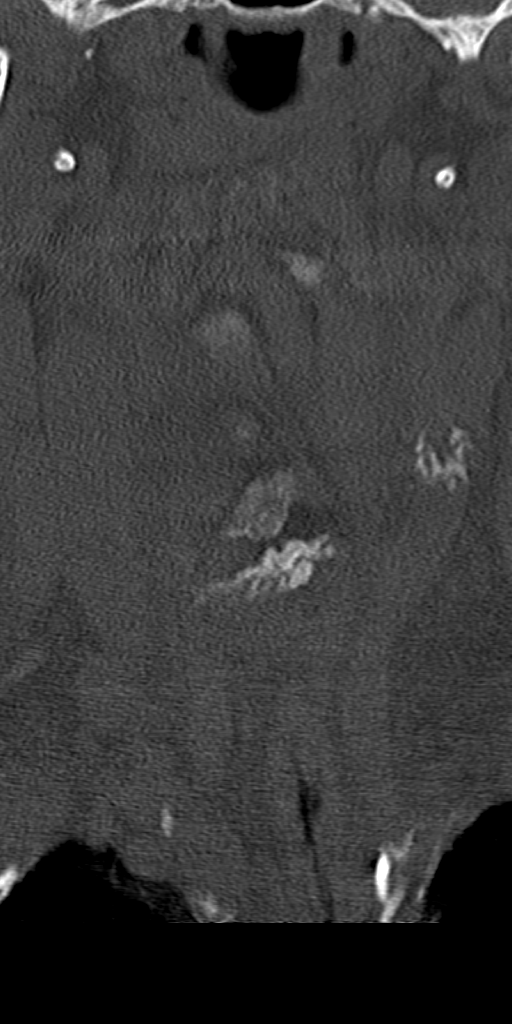
[im 19/46  bone]
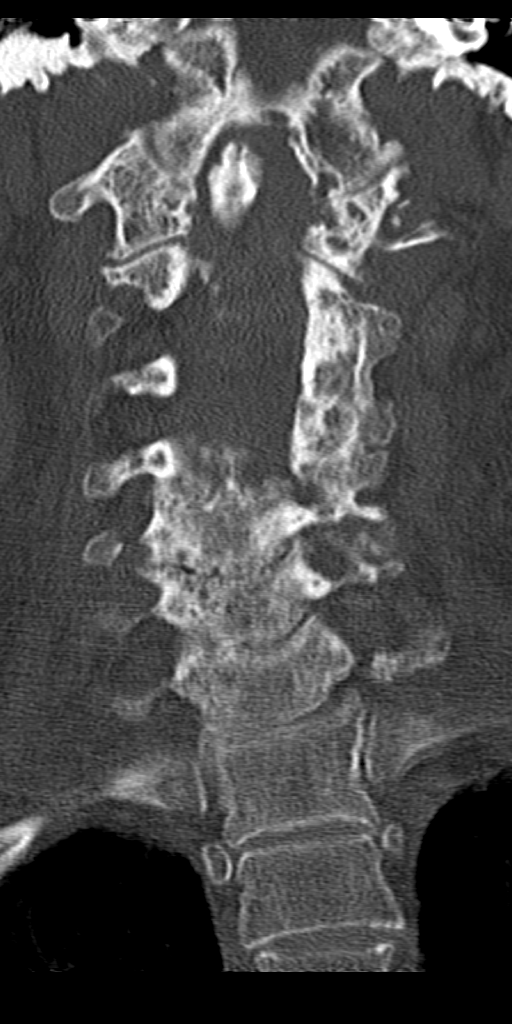
[im 28/46  bone]
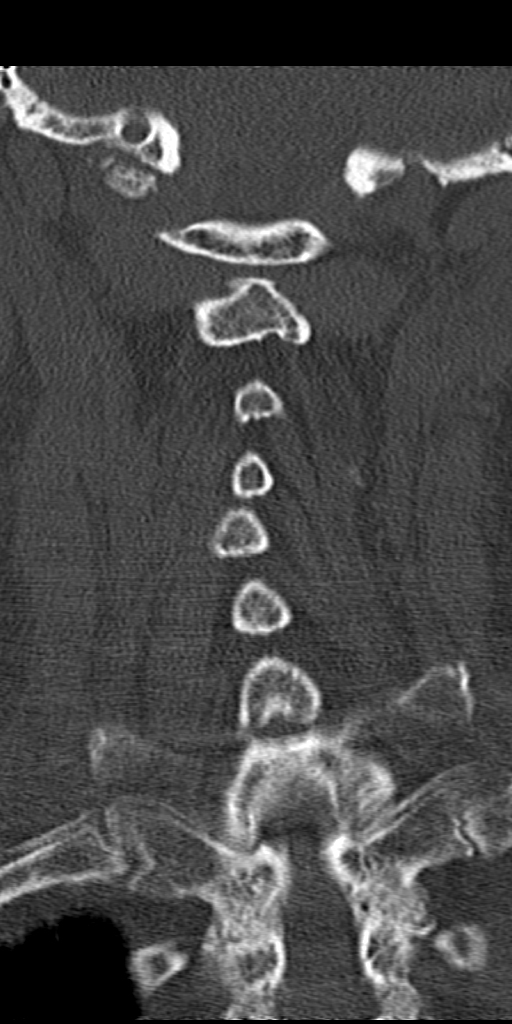

[Series 8: orthogonal bone · axial · 0.17mm/px · z∈[+965,+965]mm · 1 of 90 slices shown, 2 images]
[im 51/90  soft-tissue]
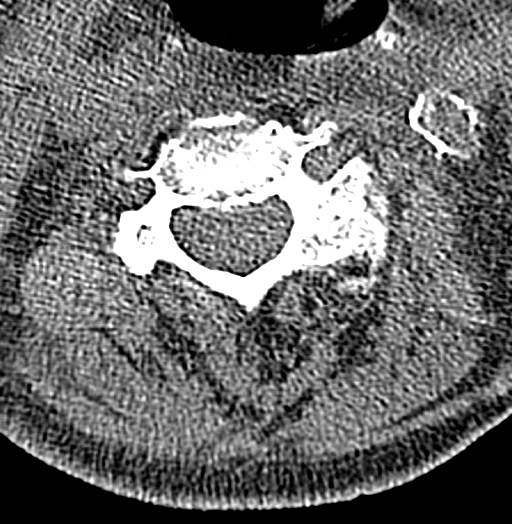
[im 51/90  bone]
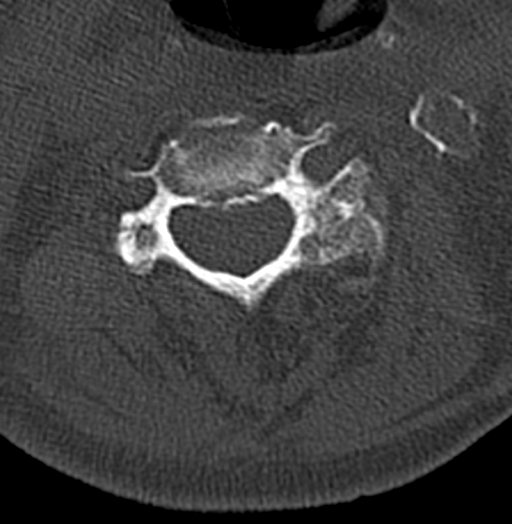

[9 of 33 positions shown; findings below may reference images not displayed]

FINDINGS: Alignment: Straightening of the cervical lordosis. Facet joints are
aligned. No traumatic listhesis.

Skull base and vertebrae: No acute fracture. No primary bone lesion
or focal pathologic process.

Soft tissues and spinal canal: No prevertebral fluid or swelling. No
visible canal hematoma.

Disc levels: Marked degenerative changes at the atlantoaxial
articulation with prominent subchondral cysts versus erosions and
large soft tissue pannus posterior to the dens resulting in
posterior displacement and mass effect on the thecal sac (series 3,
image 14; series 6, image 22).

Multilevel intervertebral disc height loss, severe at the C4 through
C7 levels. Multilevel facet and uncovertebral arthropathy. There is
bony ankylosis of the left facet joints at C2-3 and C3-4. At least
moderate canal stenosis at the C5-6 level. Severe multilevel
foraminal stenosis, most pronounced at C3-4 and C4-5 on the left.

Upper chest: Trace left pleural effusion.

Other: None.
IMPRESSION: 1. No acute cervical spine fracture or posttraumatic subluxation.
2. Marked chronic degenerative changes at the atlantoaxial
articulation with prominent subchondral cysts versus erosions and
large soft tissue pannus posterior to the dens resulting in
posterior displacement and mass effect on the thecal sac.
3. Multilevel degenerative disc and facet arthropathy.
4. Severe multilevel foraminal stenosis, most pronounced at C3-4 and
C4-5 on the left.
5. Trace left pleural effusion.

## 2019-01-15 IMAGING — CR DG SHOULDER 2+V*R*
3 series · 3 of 3 positions shown · non-contrast
Comparison: None.

CLINICAL DATA: Right shoulder pain and bruising secondary to a fall
last night.

EXAM:
RIGHT SHOULDER - 2+ VIEW

[shoulder y view]
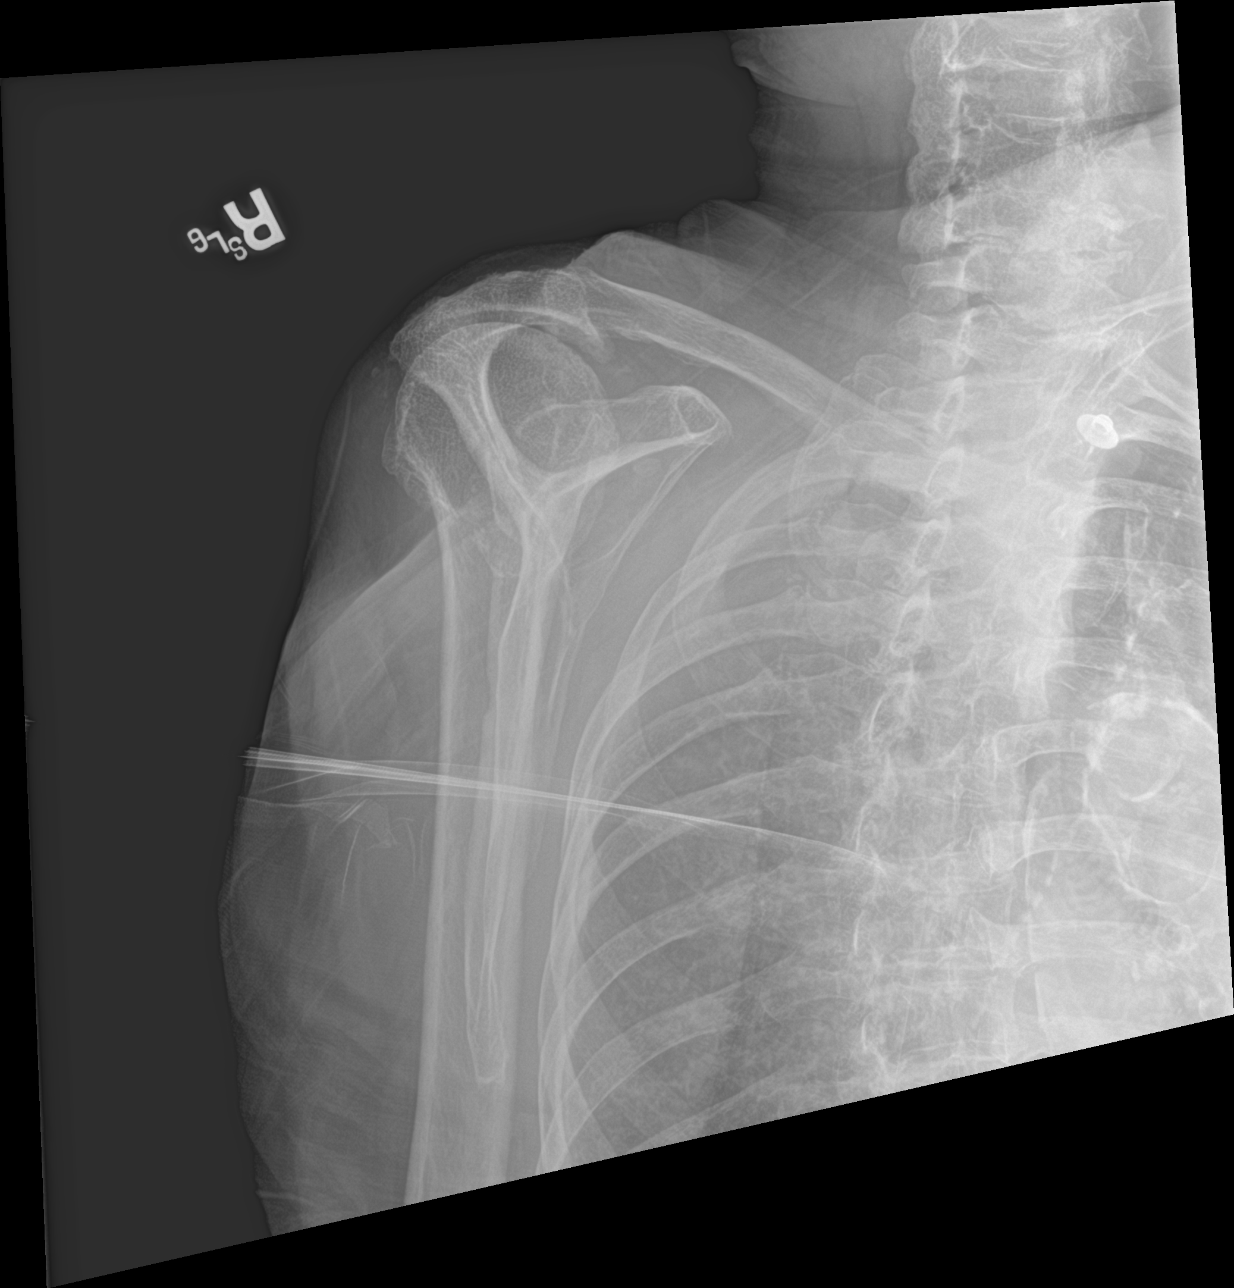

[shoulder axillary]
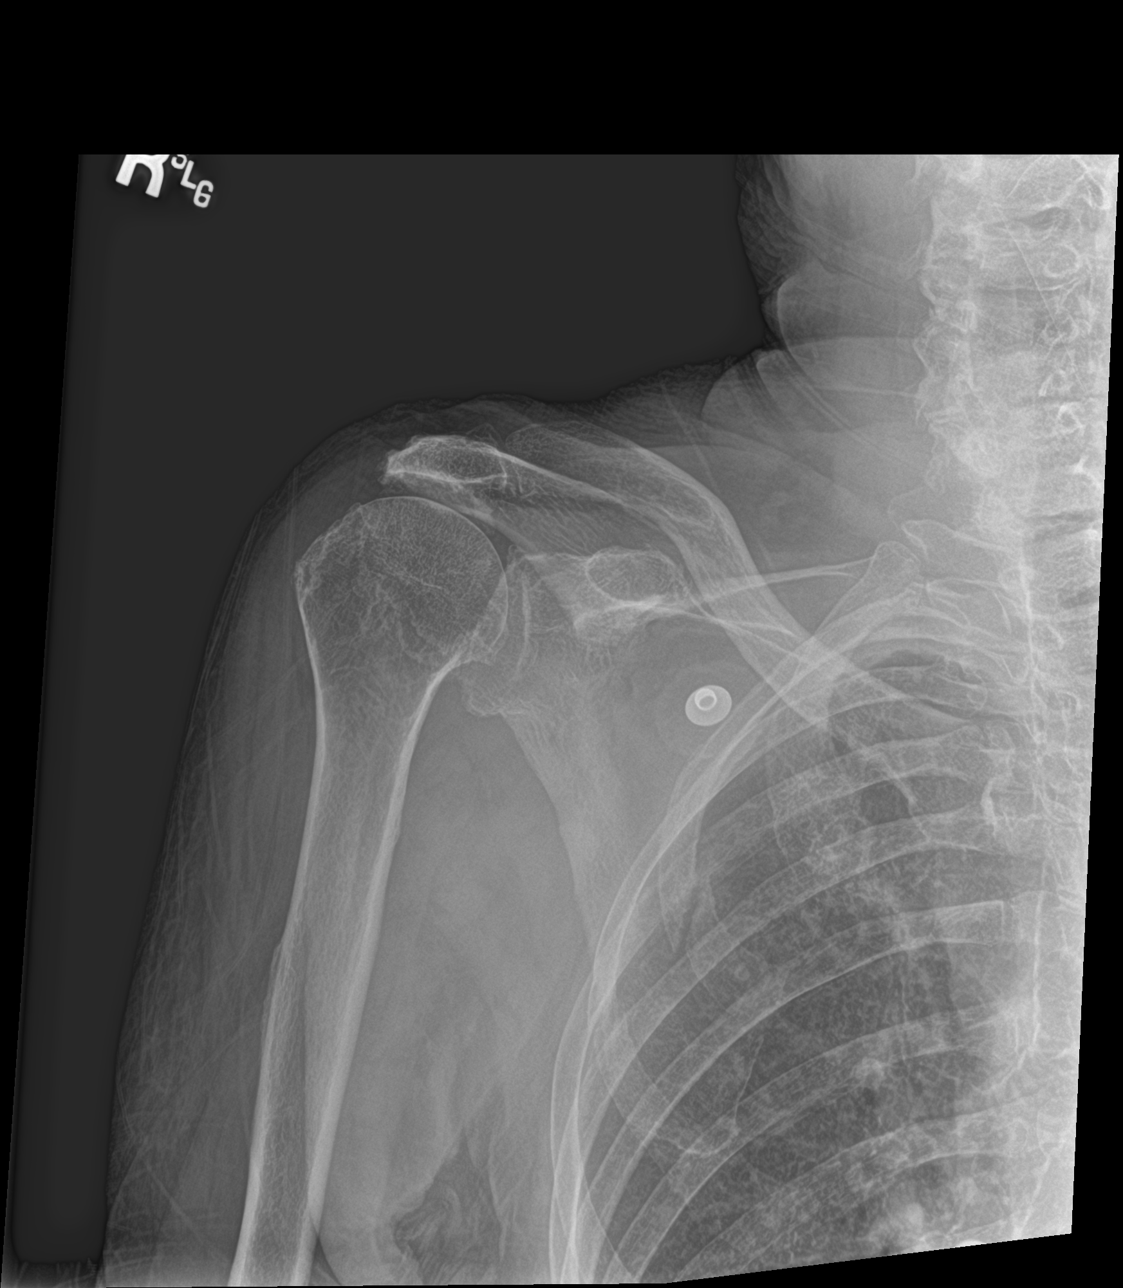

[shoulder grashey]
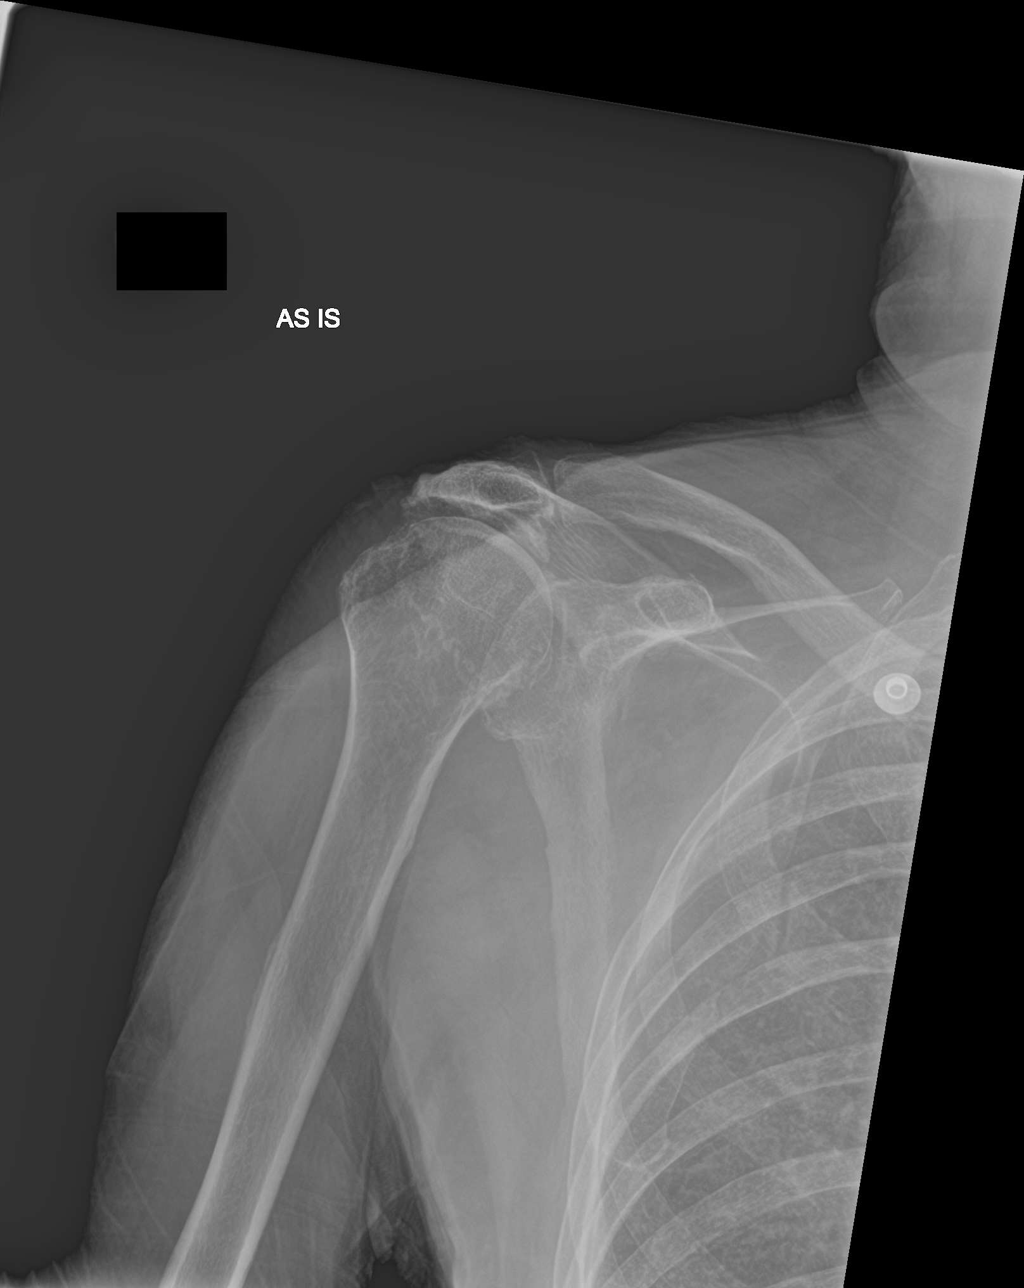

[3 of 3 positions shown; findings below may reference images not displayed]

FINDINGS: There are slightly displaced fractures of the anterolateral aspects
of the right second and third ribs. There is no fracture or
dislocation of the right shoulder. Narrowing of the subacromial
space consistent with chronic rotator cuff tear. Osteopenia.
IMPRESSION: 1. Slightly displaced fractures of the anterolateral aspects of the
right second and third ribs.
2. Chronic rotator cuff tear.
3. No acute abnormality of the right shoulder.

## 2019-01-15 MED ORDER — LACTATED RINGERS IV BOLUS
1000.0000 mL | Freq: Once | INTRAVENOUS | Status: AC
  Administered 2019-01-15: 13:00:00 1000 mL via INTRAVENOUS

## 2019-01-15 MED ORDER — LACTATED RINGERS IV SOLN
INTRAVENOUS | Status: DC
  Administered 2019-01-15: 19:00:00 via INTRAVENOUS

## 2019-01-15 MED ORDER — METOPROLOL TARTRATE 50 MG PO TABS: 100.0000 mg | ORAL_TABLET | Freq: Two times a day (BID) | ORAL | Status: DC

## 2019-01-15 MED ORDER — HYDROCODONE-ACETAMINOPHEN 5-325 MG PO TABS: 1.0000 | ORAL_TABLET | Freq: Four times a day (QID) | ORAL | Status: DC | PRN

## 2019-01-15 MED ORDER — DILTIAZEM HCL ER COATED BEADS 120 MG PO CP24
120.0000 mg | ORAL_CAPSULE | Freq: Every day | ORAL | Status: DC
  Administered 2019-01-17 – 2019-01-21 (×3): 120 mg via ORAL
  Filled 2019-01-15 (×7): qty 1

## 2019-01-15 MED ORDER — IPRATROPIUM-ALBUTEROL 20-100 MCG/ACT IN AERS: 2.0000 | INHALATION_SPRAY | Freq: Four times a day (QID) | RESPIRATORY_TRACT | Status: DC | PRN

## 2019-01-15 MED ORDER — HYDROCODONE-ACETAMINOPHEN 5-325 MG PO TABS
1.0000 | ORAL_TABLET | ORAL | Status: DC | PRN
  Administered 2019-01-17 (×2): 1 via ORAL
  Filled 2019-01-15 (×2): qty 1

## 2019-01-15 MED ORDER — CEFAZOLIN SODIUM-DEXTROSE 1-4 GM/50ML-% IV SOLN
1.0000 g | INTRAVENOUS | Status: AC
  Administered 2019-01-16: 1 g via INTRAVENOUS
  Filled 2019-01-15: qty 50

## 2019-01-15 MED ORDER — PRAVASTATIN SODIUM 20 MG PO TABS
20.0000 mg | ORAL_TABLET | Freq: Every day | ORAL | Status: DC
  Administered 2019-01-15 – 2019-01-16 (×2): 20 mg via ORAL
  Filled 2019-01-15 (×2): qty 1

## 2019-01-15 MED ORDER — MORPHINE SULFATE (PF) 2 MG/ML IV SOLN: 1.0000 mg | INTRAVENOUS | Status: DC | PRN

## 2019-01-15 MED ORDER — ONDANSETRON HCL 4 MG/2ML IJ SOLN: 4.0000 mg | Freq: Four times a day (QID) | INTRAMUSCULAR | Status: DC | PRN

## 2019-01-15 MED ORDER — CLINDAMYCIN PHOSPHATE 600 MG/50ML IV SOLN
600.0000 mg | INTRAVENOUS | Status: AC
  Administered 2019-01-16: 600 mg via INTRAVENOUS
  Filled 2019-01-15: qty 50

## 2019-01-15 MED ORDER — CALCIUM CARBONATE-VITAMIN D 500-200 MG-UNIT PO TABS
1.0000 | ORAL_TABLET | Freq: Two times a day (BID) | ORAL | Status: DC
  Administered 2019-01-15 – 2019-01-21 (×11): 1 via ORAL
  Filled 2019-01-15 (×11): qty 1

## 2019-01-15 MED ORDER — LIDOCAINE 5 % EX PTCH
1.0000 | MEDICATED_PATCH | CUTANEOUS | Status: DC
  Administered 2019-01-15 – 2019-01-20 (×5): 1 via TRANSDERMAL
  Filled 2019-01-15 (×7): qty 1

## 2019-01-15 MED ORDER — OXYCODONE-ACETAMINOPHEN 5-325 MG PO TABS
1.0000 | ORAL_TABLET | Freq: Once | ORAL | Status: AC
  Administered 2019-01-15: 14:00:00 1 via ORAL
  Filled 2019-01-15: qty 1

## 2019-01-15 MED ORDER — IPRATROPIUM-ALBUTEROL 0.5-2.5 (3) MG/3ML IN SOLN: 3.0000 mL | Freq: Four times a day (QID) | RESPIRATORY_TRACT | Status: DC | PRN

## 2019-01-15 MED ORDER — DILTIAZEM HCL ER COATED BEADS 120 MG PO CP24: 120.0000 mg | ORAL_CAPSULE | Freq: Every day | ORAL | Status: DC

## 2019-01-15 MED ORDER — BUMETANIDE 1 MG PO TABS: 2.0000 mg | ORAL_TABLET | Freq: Every day | ORAL | Status: DC

## 2019-01-15 MED ORDER — ONDANSETRON HCL 4 MG PO TABS: 4.0000 mg | ORAL_TABLET | Freq: Four times a day (QID) | ORAL | Status: DC | PRN

## 2019-01-15 MED ORDER — METOPROLOL TARTRATE 50 MG PO TABS
100.0000 mg | ORAL_TABLET | Freq: Two times a day (BID) | ORAL | Status: DC
  Administered 2019-01-15 – 2019-01-21 (×12): 100 mg via ORAL
  Filled 2019-01-15 (×13): qty 2

## 2019-01-15 MED ORDER — ISOSORBIDE MONONITRATE ER 30 MG PO TB24
30.0000 mg | ORAL_TABLET | Freq: Every day | ORAL | Status: DC
  Administered 2019-01-17 – 2019-01-21 (×2): 30 mg via ORAL
  Filled 2019-01-15 (×4): qty 1

## 2019-01-15 NOTE — ED Notes (Signed)
Urine sent to lab. Pt able to ambulate a short distance to the bathroom to collect urine. Daughter at bedside. MD to room to notify of plan of care for broken elbow. Ortho consult and possible surgery.

## 2019-01-15 NOTE — ED Notes (Signed)
Pt difficult stick, Lab called to bedside

## 2019-01-15 NOTE — ED Provider Notes (Signed)
[facility] Emergency Department Provider Note   ____________________________________________   First MD Initiated Contact with Patient 01/15/19 [phone]     (approximate)  I have reviewed the triage vital signs and the nursing notes.   HISTORY  Chief Complaint Fall    HPI Sheryl Suarez is a 85 y.o. female with past medical history of CAD status post CABG, hypertension, A. fib, and CHF who presents to the ED following multiple falls.  Patient reports that she has been feeling increasingly weak over the past few days and has had multiple falls.  Most recent fall occurred overnight and patient is unsure at what time it occurred.  She is not sure what caused her to fall, states "I just went down" and believes she was down to the ground in her kitchen for multiple hours.  She lives alone and when her daughter went to check on her this morning and found her on the ground, EMS was called.  Patient currently complains of only generalized weakness, denies any headache, neck pain, chest pain, abdominal pain, hip pain, or extremity pain.  She does report a significant ecchymosis to her right side, where she had another recent fall.        Past Medical History:  Diagnosis Date  . Breast cancer ([place])    remission  . Hypertension     Patient Active Problem List   Diagnosis Date Noted  . Closed fracture dislocation of right elbow 01/15/2019  . GI bleed 08/18/2015    Past Surgical History:  Procedure Laterality Date  . ABDOMINAL HYSTERECTOMY    . APPENDECTOMY    . BREAST IMPLANT EXCHANGE    . CHOLECYSTECTOMY    . ESOPHAGOGASTRODUODENOSCOPY (EGD) WITH PROPOFOL N/A 08/20/2015   Procedure: ESOPHAGOGASTRODUODENOSCOPY (EGD) WITH PROPOFOL;  Surgeon: Lollie Sails, MD;  Location: [facility] ENDOSCOPY;  Service: Endoscopy;  Laterality: N/A;  . MASTECTOMY Bilateral     Prior to Admission medications   Medication Sig Start Date End Date Taking? Authorizing Provider   aspirin EC 81 MG tablet Take 81 mg by mouth daily.   Yes [provider]  bumetanide (BUMEX) 2 MG tablet Take 2 mg by mouth daily. 12/12/18 12/12/19 Yes [provider]  Calcium Carbonate-Vitamin D (CALCIUM 600+D) 600-400 MG-UNIT tablet Take 1 tablet by mouth 2 (two) times daily.   Yes [provider]  COMBIVENT RESPIMAT 20-100 MCG/ACT AERS respimat Inhale 2 puffs into the lungs 4 (four) times daily as needed. 01/08/19  Yes [provider]  diltiazem (CARDIZEM CD) 120 MG 24 hr capsule Take 120 mg by mouth daily. 01/07/19  Yes [provider]  isosorbide mononitrate (IMDUR) 30 MG 24 hr tablet Take 30 mg by mouth daily. 12/10/18  Yes [provider]  lovastatin (MEVACOR) 40 MG tablet Take 40 mg by mouth at bedtime. 01/06/19  Yes [provider]  metoprolol (LOPRESSOR) 100 MG tablet Take 100 mg by mouth 2 (two) times daily.   Yes [provider]    Allergies Patient has no known allergies.  Family History  Problem Relation Age of Onset  . CAD Mother   . CAD Father     Social History Social History   Tobacco Use  . Smoking status: Never Smoker  Substance Use Topics  . Alcohol use: No  . Drug use: Not on file    Review of Systems  Constitutional: No fever/chills.  Positive for generalized weakness. Eyes: No visual changes. ENT: No sore throat. Cardiovascular: Denies  chest pain. Respiratory: Denies shortness of breath. Gastrointestinal: No abdominal pain.  No nausea, no vomiting.  No diarrhea.  No constipation. Genitourinary: Negative for dysuria. Musculoskeletal: Negative for back pain. Skin: Negative for rash. Neurological: Negative for headaches, focal weakness or numbness.  ____________________________________________   PHYSICAL EXAM:  VITAL SIGNS: ED Triage Vitals [01/15/19 0958]  Enc Vitals Group     BP (!) 112/98     Pulse Rate (!) 114     Resp 20     Temp 98.1 F (36.7 C)     Temp Source  Oral     SpO2 98 %     Weight      Height      Head Circumference      Peak Flow      Pain Score 0     Pain Loc      Pain Edu?      Excl. in [place]?     Constitutional: Alert and oriented. Eyes: Conjunctivae are normal. Head: Atraumatic. Nose: No congestion/rhinnorhea. Mouth/Throat: Mucous membranes are moist. Neck: Normal ROM, no midline cervical spine tenderness. Cardiovascular: Normal rate, regular rhythm. Grossly normal heart sounds. Respiratory: Normal respiratory effort.  No retractions. Lungs CTAB. Gastrointestinal: Soft and nontender. No distention. Genitourinary: deferred Musculoskeletal: No lower extremity tenderness nor edema.  Diffuse tenderness to right shoulder and right elbow with no tenderness to bilateral hips, pelvis stable. Neurologic:  Normal speech and language. No gross focal neurologic deficits are appreciated. Skin:  Skin is warm, dry and intact.  Ecchymosis noted over right chest wall and over much of her right upper arm.  Small skin tear over left forearm. Psychiatric: Mood and affect are normal. Speech and behavior are normal.  ____________________________________________   LABS (all labs ordered are listed, but only abnormal results are displayed)  Labs Reviewed  CBC WITH DIFFERENTIAL/PLATELET - Abnormal; Notable for the following components:      Result Value   RBC 3.74 (*)    Hemoglobin 11.5 (*)    Platelets 108 (*)    All other components within normal limits  URINALYSIS, COMPLETE (UACMP) WITH MICROSCOPIC - Abnormal; Notable for the following components:   Color, Urine YELLOW (*)    APPearance HAZY (*)    Ketones, ur 5 (*)    Protein, ur 30 (*)    Bacteria, UA MANY (*)    All other components within normal limits  CK - Abnormal; Notable for the following components:   Total CK 1,926 (*)    All other components within normal limits  COMPREHENSIVE METABOLIC PANEL - Abnormal; Notable for the following components:   BUN 42 (*)    AST 65 (*)     Total Bilirubin 1.8 (*)    GFR calc non Af Amer 50 (*)    GFR calc Af Amer 58 (*)    All other components within normal limits  TROPONIN I (HIGH SENSITIVITY) - Abnormal; Notable for the following components:   Troponin I (High Sensitivity) 31 (*)    All other components within normal limits  SARS CORONAVIRUS 2 (TAT 6-24 HRS)  PROTIME-INR  TROPONIN I (HIGH SENSITIVITY)   ____________________________________________  EKG  ED ECG REPORT I, Blake Divine, the attending physician, personally viewed and interpreted this ECG.   Date: 01/15/2019  EKG Time: 10:06  Rate: 104  Rhythm: atrial fibrillation, rate 104  Axis: LAD  Intervals:none  ST&T Change: None   PROCEDURES  Procedure(s) performed (including Critical Care):  Procedures   ____________________________________________  INITIAL IMPRESSION / ASSESSMENT AND PLAN / ED COURSE       85 year old female with history of CAD status post CABG, hypertension, A. fib, and CHF presents to the ED complaining of increasing weakness as well as multiple falls over the past week.  She is noted to be in A. fib with reasonably controlled rate, has chronic A. fib per cardiology notes with no anticoagulation.  No acute ischemic changes noted, given her generalized weakness will screen troponin in addition to CBC, CMP, UA.  We will also assess for traumatic injuries with CT of head and C-spine, x-rays of chest, right shoulder and elbow.  No evidence of traumatic injury to hips or lower extremities, patient able to ambulate with EMS.  Imaging of head and C-spine are negative for acute process, however x-rays show 2 rib fractures on the right as well as displaced fracture of olecranon process.  Case was discussed with Dr. Sabra Heck of orthopedic surgery, who recommends long-arm splint placement and tentative plan for operative repair tomorrow.  Patient's labs are unremarkable outside of mild elevation in her CK, will hydrate with IV fluids.  Plan  to admit patient for management of rib fractures as well as fracture of olecranon process.  Case discussed with hospitalist, who accepts patient for admission.     ____________________________________________   FINAL CLINICAL IMPRESSION(S) / ED DIAGNOSES  Final diagnoses:  Fall, initial encounter  Closed fracture of multiple ribs of right side, initial encounter  Closed fracture of olecranon process of right ulna, initial encounter  Generalized weakness     ED Discharge Orders    None       Note:  This document was prepared using Dragon voice recognition software and may include unintentional dictation errors.   Blake Divine, MD 01/15/19 1536

## 2019-01-15 NOTE — ED Notes (Signed)
Daughter given room assignment.

## 2019-01-15 NOTE — ED Notes (Signed)
Family at bedside. 

## 2019-01-15 NOTE — ED Notes (Signed)
ED TO INPATIENT HANDOFF REPORT  ED Nurse Name and Phone #:  Amaad Byers [phone]  S Name/Age/Gender Sheryl Suarez 85 y.o. female Room/Bed: ED11A/ED11A  Code Status   Code Status: DNR  Home/SNF/Other Home Patient oriented to: self, place, time and situation Is this baseline? Yes   Triage Complete: Triage complete  Chief Complaint Fall  Triage Note PT from home via EMS with c/o multipal falls since yesterday. Hx of afib. Per EMS pt slightly SOB. VS.    Allergies No Known Allergies  Level of Care/Admitting Diagnosis ED Disposition    ED Disposition Condition [facility] Area: [place] [[id number]]  Level of Care: Med-Surg [16]  Covid Evaluation: Asymptomatic Screening Protocol (No Symptoms)  Diagnosis: Closed fracture dislocation of right elbow [id number]  Admitting Physician: Anda Latina [id number]  Attending Physician: Anda Latina [phone]  Estimated length of stay: 3 - 4 days  Certification:: I certify this patient will need inpatient services for at least 2 midnights  PT Class (Do Not Modify): Inpatient [101]  PT Acc Code (Do Not Modify): Private [1]       B Medical/Surgery History Past Medical History:  Diagnosis Date  . Breast cancer ([place])    remission  . Hypertension    Past Surgical History:  Procedure Laterality Date  . ABDOMINAL HYSTERECTOMY    . APPENDECTOMY    . BREAST IMPLANT EXCHANGE    . CHOLECYSTECTOMY    . ESOPHAGOGASTRODUODENOSCOPY (EGD) WITH PROPOFOL N/A 08/20/2015   Procedure: ESOPHAGOGASTRODUODENOSCOPY (EGD) WITH PROPOFOL;  Surgeon: Lollie Sails, MD;  Location: [facility] ENDOSCOPY;  Service: Endoscopy;  Laterality: N/A;  . MASTECTOMY Bilateral      A IV Location/Drains/Wounds Patient Lines/Drains/Airways Status   Active Line/Drains/Airways    Name:   Placement date:   Placement time:   Site:   Days:   Peripheral IV 01/15/19 Left Forearm   01/15/19    1018    Forearm   less than 1   Airway   08/20/15     1358     1244          Intake/Output Last 24 hours No intake or output data in the 24 hours ending 01/15/19 1759  Labs/Imaging Results for orders placed or performed during the hospital encounter of 01/15/19 (from the past 48 hour(s))  CBC with Differential     Status: Abnormal   Collection Time: 01/15/19 10:06 AM  Result Value Ref Range   WBC 9.0 4.0 - 10.5 K/uL   RBC 3.74 (L) 3.87 - 5.11 MIL/uL   Hemoglobin 11.5 (L) 12.0 - 15.0 g/dL   HCT 37.4 36.0 - 46.0 %   MCV 100.0 80.0 - 100.0 fL   MCH 30.7 26.0 - 34.0 pg   MCHC 30.7 30.0 - 36.0 g/dL   RDW 14.9 11.5 - 15.5 %   Platelets 108 (L) 150 - 400 K/uL    Comment: Immature Platelet Fraction may be clinically indicated, consider ordering this additional test [id number]    nRBC 0.0 0.0 - 0.2 %   Neutrophils Relative % 81 %   Neutro Abs 7.3 1.7 - 7.7 K/uL   Lymphocytes Relative 10 %   Lymphs Abs 0.9 0.7 - 4.0 K/uL   Monocytes Relative 9 %   Monocytes Absolute 0.8 0.1 - 1.0 K/uL   Eosinophils Relative 0 %   Eosinophils Absolute 0.0 0.0 - 0.5 K/uL   Basophils Relative 0 %   Basophils Absolute 0.0 0.0 - 0.1 K/uL  Immature Granulocytes 0 %   Abs Immature Granulocytes 0.02 0.00 - 0.07 K/uL    Comment: Performed at [facility], [address] [place], [place] [address]  CK     Status: Abnormal   Collection Time: 01/15/19 11:49 AM  Result Value Ref Range   Total CK 1,926 (H) 38 - 234 U/L    Comment: Performed at [facility], [address] [place], [place] [address]  Comprehensive metabolic panel     Status: Abnormal   Collection Time: 01/15/19 11:49 AM  Result Value Ref Range   Sodium 141 135 - 145 mmol/L   Potassium 4.2 3.5 - 5.1 mmol/L   Chloride 105 98 - 111 mmol/L   CO2 24 22 - 32 mmol/L   Glucose, Bld 99 70 - 99 mg/dL   BUN 42 (H) 8 - 23 mg/dL   Creatinine, Ser 0.98 0.44 - 1.00 mg/dL   Calcium 9.7 8.9 - 10.3 mg/dL   Total Protein 7.2 6.5 - 8.1 g/dL   Albumin 4.0 3.5 - 5.0 g/dL   AST 65 (H)  15 - 41 U/L   ALT 34 0 - 44 U/L   Alkaline Phosphatase 119 38 - 126 U/L   Total Bilirubin 1.8 (H) 0.3 - 1.2 mg/dL   GFR calc non Af Amer 50 (L) >60 mL/min   GFR calc Af Amer 58 (L) >60 mL/min   Anion gap 12 5 - 15    Comment: Performed at [facility], [place], [place] [address]  Troponin I (High Sensitivity)     Status: Abnormal   Collection Time: 01/15/19 11:49 AM  Result Value Ref Range   Troponin I (High Sensitivity) 31 (H) <18 ng/L    Comment: (NOTE) Elevated high sensitivity troponin I (hsTnI) values and significant  changes across serial measurements may suggest ACS but many other  chronic and acute conditions are known to elevate hsTnI results.  Refer to the "Links" section for chest pain algorithms and additional  guidance. Performed at [facility], [address] [place], [place] [address]   Urinalysis, Complete w Microscopic     Status: Abnormal   Collection Time: 01/15/19 12:15 PM  Result Value Ref Range   Color, Urine YELLOW (A) YELLOW   APPearance HAZY (A) CLEAR   Specific Gravity, Urine 1.021 1.005 - 1.030   pH 5.0 5.0 - 8.0   Glucose, UA NEGATIVE NEGATIVE mg/dL   Hgb urine dipstick NEGATIVE NEGATIVE   Bilirubin Urine NEGATIVE NEGATIVE   Ketones, ur 5 (A) NEGATIVE mg/dL   Protein, ur 30 (A) NEGATIVE mg/dL   Nitrite NEGATIVE NEGATIVE   Leukocytes,Ua NEGATIVE NEGATIVE   RBC / HPF 0-5 0 - 5 RBC/hpf   WBC, UA 0-5 0 - 5 WBC/hpf   Bacteria, UA MANY (A) NONE SEEN   Squamous Epithelial / LPF 0-5 0 - 5   Mucus PRESENT    Hyaline Casts, UA PRESENT     Comment: Performed at [facility], [address], [place], [place] [address]   Dg Chest 2 View  Result Date: 01/15/2019 CLINICAL DATA:  Multiple trauma secondary to a fall last night. Bruising to the right side of the chest and right shoulder. EXAM: CHEST - 2 VIEW COMPARISON:  None. FINDINGS: There are fractures of the anterolateral aspects of the right second and third  ribs. No other acute bone abnormality. No pneumothorax or lung contusion or pleural effusion. Heart size and pulmonary vascularity are normal. Aortic  atherosclerosis. No infiltrates. Chronic degenerative changes of the right shoulder consistent with chronic rotator cuff tear. IMPRESSION: Fractures of the anterolateral aspects of the right second and third ribs. No pneumothorax or pleural effusion. Aortic Atherosclerosis (ICD10-I70.0). Electronically Signed   By: Lorriane Shire M.D.   On: 01/15/2019 11:26   Dg Shoulder Right  Result Date: 01/15/2019 CLINICAL DATA:  Right shoulder pain and bruising secondary to a fall last night. EXAM: RIGHT SHOULDER - 2+ VIEW COMPARISON:  None. FINDINGS: There are slightly displaced fractures of the anterolateral aspects of the right second and third ribs. There is no fracture or dislocation of the right shoulder. Narrowing of the subacromial space consistent with chronic rotator cuff tear. Osteopenia. IMPRESSION: 1. Slightly displaced fractures of the anterolateral aspects of the right second and third ribs. 2. Chronic rotator cuff tear. 3. No acute abnormality of the right shoulder. Electronically Signed   By: Lorriane Shire M.D.   On: 01/15/2019 11:28   Dg Elbow 2 Views Right  Result Date: 01/15/2019 CLINICAL DATA:  Right elbow pain secondary to a fall last night. EXAM: RIGHT ELBOW - 2 VIEW COMPARISON:  None. FINDINGS: There is a distracted fracture of the olecranon process of the proximal ulna. Hemarthrosis. Distal humerus and proximal radius are intact. IMPRESSION: 1. Acute distracted fracture of the olecranon process of the proximal ulna. 2. Hemarthrosis. Electronically Signed   By: Lorriane Shire M.D.   On: 01/15/2019 11:27   Ct Head Wo Contrast  Result Date: 01/15/2019 CLINICAL DATA:  Multiple falls, head trauma EXAM: CT HEAD WITHOUT CONTRAST TECHNIQUE: Contiguous axial images were obtained from the base of the skull through the vertex without intravenous  contrast. COMPARISON:  None. FINDINGS: Brain: No evidence of acute infarction, hemorrhage, hydrocephalus, extra-axial collection or mass lesion/mass effect. Scattered low-density changes within the periventricular and subcortical white matter compatible with chronic microvascular ischemic change. Mild diffuse cerebral volume loss. Vascular: Mild atherosclerotic calcifications involving the large vessels of the skull base. No unexpected hyperdense vessel. Skull: Normal. Negative for fracture or focal lesion. Sinuses/Orbits: No acute finding. Other: None. IMPRESSION: 1.  No acute intracranial findings. 2.  Chronic microvascular ischemic change and cerebral volume loss. Electronically Signed   By: Davina Poke M.D.   On: 01/15/2019 10:53   Ct Cervical Spine Wo Contrast  Result Date: 01/15/2019 CLINICAL DATA:  Neck pain. Multiple falls EXAM: CT CERVICAL SPINE WITHOUT CONTRAST TECHNIQUE: Multidetector CT imaging of the cervical spine was performed without intravenous contrast. Multiplanar CT image reconstructions were also generated. COMPARISON:  None. FINDINGS: Alignment: Straightening of the cervical lordosis. Facet joints are aligned. No traumatic listhesis. Skull base and vertebrae: No acute fracture. No primary bone lesion or focal pathologic process. Soft tissues and spinal canal: No prevertebral fluid or swelling. No visible canal hematoma. Disc levels: Marked degenerative changes at the atlantoaxial articulation with prominent subchondral cysts versus erosions and large soft tissue pannus posterior to the dens resulting in posterior displacement and mass effect on the thecal sac (series 3, image 14; series 6, image 22). Multilevel intervertebral disc height loss, severe at the C4 through C7 levels. Multilevel facet and uncovertebral arthropathy. There is bony ankylosis of the left facet joints at C2-3 and C3-4. At least moderate canal stenosis at the C5-6 level. Severe multilevel foraminal stenosis, most  pronounced at C3-4 and C4-5 on the left. Upper chest: Trace left pleural effusion. Other: None. IMPRESSION: 1. No acute cervical spine fracture or posttraumatic subluxation. 2. Marked chronic degenerative changes at the  atlantoaxial articulation with prominent subchondral cysts versus erosions and large soft tissue pannus posterior to the dens resulting in posterior displacement and mass effect on the thecal sac. 3. Multilevel degenerative disc and facet arthropathy. 4. Severe multilevel foraminal stenosis, most pronounced at C3-4 and C4-5 on the left. 5. Trace left pleural effusion. Electronically Signed   By: Davina Poke M.D.   On: 01/15/2019 11:02    Pending Labs Unresulted Labs (From admission, onward)    Start     Ordered   01/16/19 [id number]  Basic metabolic panel  Tomorrow morning,   STAT     01/15/19 1445   01/16/19 0500  CBC  Tomorrow morning,   STAT     01/15/19 1445   01/16/19 0500  Protime-INR  Tomorrow morning,   STAT     01/15/19 1445   01/16/19 0500  APTT  Tomorrow morning,   STAT     01/15/19 1445   01/16/19 0500  Type and screen [place]  Once,   STAT    Comments: [place]    01/15/19 1445   01/15/19 1505  Protime-INR  ONCE - STAT,   STAT     01/15/19 1504   01/15/19 1257  SARS CORONAVIRUS 2 (TAT 6-24 HRS) Nasopharyngeal Nasopharyngeal Swab  (Asymptomatic/Tier 3)  Once,   STAT    Question Answer Comment  Is this test for diagnosis or screening Screening   Symptomatic for COVID-19 as defined by CDC No   Hospitalized for COVID-19 No   Admitted to ICU for COVID-19 No   Previously tested for COVID-19 No   Resident in a congregate (group) care setting No   Employed in healthcare setting No   Pregnant No      01/15/19 1257          Vitals/Pain Today's Vitals   01/15/19 1600 01/15/19 1630 01/15/19 1715 01/15/19 1730  BP: 116/82 136/74  139/85  Pulse:  (!) 105  94  Resp: 17 17  15   Temp:      TempSrc:      SpO2:   97%  95%  PainSc:   Asleep     Isolation Precautions No active isolations  Medications Medications  lidocaine (LIDODERM) 5 % 1 patch (1 patch Transdermal Patch Applied 01/15/19 1336)  ondansetron (ZOFRAN) tablet 4 mg (has no administration in time range)    Or  ondansetron (ZOFRAN) injection 4 mg (has no administration in time range)  lactated ringers infusion (has no administration in time range)  ceFAZolin (ANCEF) IVPB 1 g/50 mL premix (has no administration in time range)  HYDROcodone-acetaminophen (NORCO/VICODIN) 5-325 MG per tablet 1 tablet (has no administration in time range)  morphine 2 MG/ML injection 1 mg (has no administration in time range)  clindamycin (CLEOCIN) IVPB 600 mg (has no administration in time range)  metoprolol tartrate (LOPRESSOR) tablet 100 mg (has no administration in time range)  diltiazem (CARDIZEM CD) 24 hr capsule 120 mg (has no administration in time range)  diltiazem (CARDIZEM CD) 24 hr capsule 120 mg (has no administration in time range)  isosorbide mononitrate (IMDUR) 24 hr tablet 30 mg (has no administration in time range)  pravastatin (PRAVACHOL) tablet 20 mg (has no administration in time range)  metoprolol tartrate (LOPRESSOR) tablet 100 mg (has no administration in time range)  Calcium Carbonate-Vitamin D 600-400 MG-UNIT 1 tablet (has no administration in time range)  Ipratropium-Albuterol (COMBIVENT) respimat 2 puff (has no administration in time range)  lactated  ringers bolus 1,000 mL (0 mLs Intravenous Stopped 01/15/19 1339)  oxyCODONE-acetaminophen (PERCOCET/ROXICET) 5-325 MG per tablet 1 tablet (1 tablet Oral Given 01/15/19 1336)    Mobility walks with device Moderate fall risk   Focused Assessments n/a   R Recommendations: See Admitting Provider Note  Report given to:   Additional Notes: n/a

## 2019-01-15 NOTE — ED Notes (Signed)
Assisted to bathroom

## 2019-01-15 NOTE — ED Notes (Signed)
Lab in with pt to draw blood.

## 2019-01-15 NOTE — ED Notes (Signed)
Pt still in CT

## 2019-01-15 NOTE — Consult Note (Signed)
Cardiology Consultation Note    Patient ID: Sheryl Suarez, MRN: [id number], DOB/AGE: 11-03-1923 85 y.o. Admit date: 01/15/2019   Date of Consult: 01/15/2019 Primary Physician: Leonel Ramsay, MD Primary Cardiologist: Dr. Saralyn Pilar  Chief Complaint: elbow pain Reason for Consultation: pre op evaluation Requesting MD: Dr. Bridgett Larsson  HPI: Sheryl Suarez is a 85 y.o. female with history of coronary artery disease status post PTCA in 1990 and again in 1997, history of ischemic cardiomyopathy with an ejection fraction of 30%, history of paroxysmal atrial fibrillation, history of hyperlipidemia, history of essential hypertension who presented to the emergency room after what appeared to be a mechanical fall causing right elbow fracture.  She has had multiple fall history.  She has been weak over the past several days and has had poor p.o. intake.  She lives alone.  Her daughter found her on the floor when she checked on her.  Her outpatient regimen includes hydrochlorothiazide 25 mg daily, metoprolol tartrate 100 mg twice daily, potassium chloride.  She was relatively hypotensive with a blood pressure 112/98 with a pulse rate of 114 on presentation.  EKG revealed atrial fibrillation with variable ventricular response.  Chest x-ray revealed no acute cardiopulmonary disease.  Troponin likely secondary to demand was 31.  Total CK consistent with mild rhabdomyolysis was 1926.  She recently had her diuretic increased due to peripheral edema. She is unaware of how she falls but has had several recently due to "weak legs". She fell several times in the last few days.  She denies chest pain. SHe lives by herself with her daughter nearby.  Past Medical History:  Diagnosis Date  . Breast cancer ([facility])    remission  . Hypertension       Surgical History:  Past Surgical History:  Procedure Laterality Date  . ABDOMINAL HYSTERECTOMY    . APPENDECTOMY    . BREAST IMPLANT EXCHANGE    . CHOLECYSTECTOMY    .  ESOPHAGOGASTRODUODENOSCOPY (EGD) WITH PROPOFOL N/A 08/20/2015   Procedure: ESOPHAGOGASTRODUODENOSCOPY (EGD) WITH PROPOFOL;  Surgeon: Lollie Sails, MD;  Location: [facility] ENDOSCOPY;  Service: Endoscopy;  Laterality: N/A;  . MASTECTOMY Bilateral      Home Meds: Prior to Admission medications   Medication Sig Start Date End Date Taking? Authorizing Provider  aspirin EC 81 MG tablet Take 81 mg by mouth daily.   Yes [provider]  bumetanide (BUMEX) 2 MG tablet Take 2 mg by mouth daily. 12/12/18 12/12/19 Yes [provider]  Calcium Carbonate-Vitamin D (CALCIUM 600+D) 600-400 MG-UNIT tablet Take 1 tablet by mouth 2 (two) times daily.   Yes [provider]  COMBIVENT RESPIMAT 20-100 MCG/ACT AERS respimat Inhale 2 puffs into the lungs 4 (four) times daily as needed. 01/08/19  Yes [provider]  diltiazem (CARDIZEM CD) 120 MG 24 hr capsule Take 120 mg by mouth daily. 01/07/19  Yes [provider]  isosorbide mononitrate (IMDUR) 30 MG 24 hr tablet Take 30 mg by mouth daily. 12/10/18  Yes [provider]  lovastatin (MEVACOR) 40 MG tablet Take 40 mg by mouth at bedtime. 01/06/19  Yes [provider]  metoprolol (LOPRESSOR) 100 MG tablet Take 100 mg by mouth 2 (two) times daily.   Yes [provider]    Inpatient Medications:  . lidocaine  1 patch Transdermal Q24H   . [START ON 01/16/2019]  ceFAZolin (ANCEF) IV    . [START ON 01/16/2019] clindamycin (CLEOCIN) IV    . lactated ringers  Allergies: No Known Allergies  Social History   Socioeconomic History  . Marital status: Widowed    Spouse name: Not on file  . Number of children: Not on file  . Years of education: Not on file  . Highest education level: Not on file  Occupational History  . Not on file  Social Needs  . Financial resource strain: Not on file  . Food insecurity    Worry: Not on file    Inability: Not on file  . Transportation needs     Medical: Not on file    Non-medical: Not on file  Tobacco Use  . Smoking status: Never Smoker  Substance and Sexual Activity  . Alcohol use: No  . Drug use: Not on file  . Sexual activity: Not on file  Lifestyle  . Physical activity    Days per week: Not on file    Minutes per session: Not on file  . Stress: Not on file  Relationships  . Social Herbalist on phone: Not on file    Gets together: Not on file    Attends religious service: Not on file    Active member of club or organization: Not on file    Attends meetings of clubs or organizations: Not on file    Relationship status: Not on file  . Intimate partner violence    Fear of current or ex partner: Not on file    Emotionally abused: Not on file    Physically abused: Not on file    Forced sexual activity: Not on file  Other Topics Concern  . Not on file  Social History Narrative  . Not on file     Family History  Problem Relation Age of Onset  . CAD Mother   . CAD Father      Review of Systems: A 12-system review of systems was performed and is negative except as noted in the HPI.  Labs: Recent Labs    01/15/19 1149  CKTOTAL 1,926*   Lab Results  Component Value Date   WBC 9.0 01/15/2019   HGB 11.5 (L) 01/15/2019   HCT 37.4 01/15/2019   MCV 100.0 01/15/2019   PLT 108 (L) 01/15/2019    Recent Labs  Lab 01/15/19 1149  NA 141  K 4.2  CL 105  CO2 24  BUN 42*  CREATININE 0.98  CALCIUM 9.7  PROT 7.2  BILITOT 1.8*  ALKPHOS 119  ALT 34  AST 65*  GLUCOSE 99   No results found for: CHOL, HDL, LDLCALC, TRIG No results found for: DDIMER  Radiology/Studies:  Dg Chest 2 View  Result Date: 01/15/2019 CLINICAL DATA:  Multiple trauma secondary to a fall last night. Bruising to the right side of the chest and right shoulder. EXAM: CHEST - 2 VIEW COMPARISON:  None. FINDINGS: There are fractures of the anterolateral aspects of the right second and third ribs. No other acute bone abnormality.  No pneumothorax or lung contusion or pleural effusion. Heart size and pulmonary vascularity are normal. Aortic atherosclerosis. No infiltrates. Chronic degenerative changes of the right shoulder consistent with chronic rotator cuff tear. IMPRESSION: Fractures of the anterolateral aspects of the right second and third ribs. No pneumothorax or pleural effusion. Aortic Atherosclerosis (ICD10-I70.0). Electronically Signed   By: Lorriane Shire M.D.   On: 01/15/2019 11:26   Dg Shoulder Right  Result Date: 01/15/2019 CLINICAL DATA:  Right shoulder pain and bruising secondary to a fall last night. EXAM: RIGHT  SHOULDER - 2+ VIEW COMPARISON:  None. FINDINGS: There are slightly displaced fractures of the anterolateral aspects of the right second and third ribs. There is no fracture or dislocation of the right shoulder. Narrowing of the subacromial space consistent with chronic rotator cuff tear. Osteopenia. IMPRESSION: 1. Slightly displaced fractures of the anterolateral aspects of the right second and third ribs. 2. Chronic rotator cuff tear. 3. No acute abnormality of the right shoulder. Electronically Signed   By: Lorriane Shire M.D.   On: 01/15/2019 11:28   Dg Elbow 2 Views Right  Result Date: 01/15/2019 CLINICAL DATA:  Right elbow pain secondary to a fall last night. EXAM: RIGHT ELBOW - 2 VIEW COMPARISON:  None. FINDINGS: There is a distracted fracture of the olecranon process of the proximal ulna. Hemarthrosis. Distal humerus and proximal radius are intact. IMPRESSION: 1. Acute distracted fracture of the olecranon process of the proximal ulna. 2. Hemarthrosis. Electronically Signed   By: Lorriane Shire M.D.   On: 01/15/2019 11:27   Ct Head Wo Contrast  Result Date: 01/15/2019 CLINICAL DATA:  Multiple falls, head trauma EXAM: CT HEAD WITHOUT CONTRAST TECHNIQUE: Contiguous axial images were obtained from the base of the skull through the vertex without intravenous contrast. COMPARISON:  None. FINDINGS:  Brain: No evidence of acute infarction, hemorrhage, hydrocephalus, extra-axial collection or mass lesion/mass effect. Scattered low-density changes within the periventricular and subcortical white matter compatible with chronic microvascular ischemic change. Mild diffuse cerebral volume loss. Vascular: Mild atherosclerotic calcifications involving the large vessels of the skull base. No unexpected hyperdense vessel. Skull: Normal. Negative for fracture or focal lesion. Sinuses/Orbits: No acute finding. Other: None. IMPRESSION: 1.  No acute intracranial findings. 2.  Chronic microvascular ischemic change and cerebral volume loss. Electronically Signed   By: Davina Poke M.D.   On: 01/15/2019 10:53   Ct Cervical Spine Wo Contrast  Result Date: 01/15/2019 CLINICAL DATA:  Neck pain. Multiple falls EXAM: CT CERVICAL SPINE WITHOUT CONTRAST TECHNIQUE: Multidetector CT imaging of the cervical spine was performed without intravenous contrast. Multiplanar CT image reconstructions were also generated. COMPARISON:  None. FINDINGS: Alignment: Straightening of the cervical lordosis. Facet joints are aligned. No traumatic listhesis. Skull base and vertebrae: No acute fracture. No primary bone lesion or focal pathologic process. Soft tissues and spinal canal: No prevertebral fluid or swelling. No visible canal hematoma. Disc levels: Marked degenerative changes at the atlantoaxial articulation with prominent subchondral cysts versus erosions and large soft tissue pannus posterior to the dens resulting in posterior displacement and mass effect on the thecal sac (series 3, image 14; series 6, image 22). Multilevel intervertebral disc height loss, severe at the C4 through C7 levels. Multilevel facet and uncovertebral arthropathy. There is bony ankylosis of the left facet joints at C2-3 and C3-4. At least moderate canal stenosis at the C5-6 level. Severe multilevel foraminal stenosis, most pronounced at C3-4 and C4-5 on the  left. Upper chest: Trace left pleural effusion. Other: None. IMPRESSION: 1. No acute cervical spine fracture or posttraumatic subluxation. 2. Marked chronic degenerative changes at the atlantoaxial articulation with prominent subchondral cysts versus erosions and large soft tissue pannus posterior to the dens resulting in posterior displacement and mass effect on the thecal sac. 3. Multilevel degenerative disc and facet arthropathy. 4. Severe multilevel foraminal stenosis, most pronounced at C3-4 and C4-5 on the left. 5. Trace left pleural effusion. Electronically Signed   By: Davina Poke M.D.   On: 01/15/2019 11:02    Wt Readings from Last 3  Encounters:  08/21/15 48.9 kg    EKG: afib with variable vr.  Physical Exam:Elderly, somewhat frail caucasian female in no acute distress Blood pressure (!) 181/85, pulse 94, temperature 98.1 F (36.7 C), temperature source Oral, resp. rate (!) 21, SpO2 100 %. There is no height or weight on file to calculate BMI. General: Well developed, well nourished, in no acute distress. Multple ecchymosis on chest, arms and back as well as knees.  Head: Normocephalic, atraumatic, sclera non-icteric, no xanthomas, nares are without discharge.  Neck: Negative for carotid bruits. JVD not elevated. Lungs: Clear bilaterally to auscultation without wheezes, rales, or rhonchi. Breathing is unlabored. Heart: irr, irr rate and rhythm Abdomen: Soft, non-tender, non-distended with normoactive bowel sounds. No hepatomegaly. No rebound/guarding. No obvious abdominal masses. Msk:  Strength and tone appear normal for age. Extremities: No clubbing or cyanosis. No edema.  Distal pedal pulses are 2+ and equal bilaterally. Neuro: Alert and oriented X 3. No facial asymmetry. No focal deficit. Moves all extremities spontaneously. Psych:  Responds to questions appropriately with a normal affect.     Assessment and Plan  85 yo female with history of cad s/p ptca in [place],  history of moderate cardiomyopathy with ef of 30%, history of paroxysmal and current afib, hyperlipidemia, peripheral edema and hyperlipidemia who was admitted after presenting to the er with history of multiple falls, with the most recent one causing right elbow fracture. She denies syncope. Pt lives alone but her daughter is very nearby. SHe had a fall last pm with no obvious fractures. Pts daughter last spoke with her at 4 in the afternoon yesterday until this amy when she found her on the floor with right arm pain.   AFIB-rate is fairly well controlled at present. She has not been on anticoagulation due to fall risk. She has been on metoprolol 100 bid and diltiazem 120 mg daily for rate control. She is also on lisinopril, imdur and furosemide for blood pressure. Not a candidate for anticoagulation due to falls. Would continue with metoprolol and diltiazem for rate control.   HFrEF-ef 305 on imdur and lisinopril as outpatient. Would continue as blood pressure control requires. Continue with lasix following I/0, hemodynamics and renal funciton.   Right elbow fracture-scheduled for surgery in am. Pt is high risk based on age and afib but is optimized as best as possible.   Falls-will need assessment regarding living arrangement prior to discharge.   Signed, Teodoro Spray MD 01/15/2019, 3:20 PM Pager: [phone]

## 2019-01-15 NOTE — ED Triage Notes (Signed)
PT from home via EMS with c/o multipal falls since yesterday. Hx of afib. Per EMS pt slightly SOB. VS.

## 2019-01-15 NOTE — H&P (Signed)
History and Physical    MAYLEY OBRAY [id number] DOB: 13-Feb-1924 DOA: 01/15/2019  Referring MD/NP/PA:   PCP: Leonel Ramsay, MD   Patient coming from:  The patient is coming from home.  At baseline, pt is independent for most of ADL.        Chief Complaint:   HPI: EMMALEA PANAGOPOULOS is a 85 y.o. female with medical history significant of HTN, CAD, s/p CABG, sCHF (EF 30%), A-fib not on AC, h/o breast cancer, presented to ED after falls. Pt lives alone and recently had multiple falls.  She fell at home yesterday afternoon and was assisted up.  She fell again at night round 8pm, not able to get up. She lied on the floor until this morning her daughter visited her.  She had large bruise on her left chest and shoulder.  She denies any pain at rest, she does not remember how she fell.  She denies headache, fever, chills, cough, chest pain, S OB, abdominal pain, nausea, vomiting or urinary symptoms.   ED Course: pt was found in A-fib, Rate 90s-110s, CK 1926, trop 31, head and c spine CT no acute abnormality, CXR and right shoulder x-ray showed anterolateral aspects of the right second and third ribs. No pneumothorax or pleural effusion. Acute distracted fracture of the olecranon process of the proximal ulna, and Hemarthrosis.  Orthopedics is consulted, plan for surgery tomorrow.  Review of Systems:   General: no fevers, chills, no body weight gain HEENT: no blurry vision, hearing changes or sore throat Respiratory: no dyspnea, coughing, wheezing CV: no chest pain, no palpitations GI: no nausea, vomiting, abdominal pain, diarrhea, constipation GU: no dysuria, burning on urination, increased urinary frequency, hematuria  Ext: mild leg edema Neuro: no unilateral weakness, numbness, or tingling, no vision change or hearing loss Skin: bruises due to fall MSK: No muscle spasm, no deformity, no limitation of range of movement in spin Heme: No easy bruising.  Travel history: No recent long distant  travel.  Allergy: No Known Allergies  Past Medical History:  Diagnosis Date   Breast cancer ([place])    remission   Hypertension     Past Surgical History:  Procedure Laterality Date   ABDOMINAL HYSTERECTOMY     APPENDECTOMY     BREAST IMPLANT EXCHANGE     CHOLECYSTECTOMY     ESOPHAGOGASTRODUODENOSCOPY (EGD) WITH PROPOFOL N/A 08/20/2015   Procedure: ESOPHAGOGASTRODUODENOSCOPY (EGD) WITH PROPOFOL;  Surgeon: Lollie Sails, MD;  Location: [facility] ENDOSCOPY;  Service: Endoscopy;  Laterality: N/A;   MASTECTOMY Bilateral     Social History:  reports that she has never smoked. She does not have any smokeless tobacco history on file. She reports that she does not drink alcohol. No history on file for drug.  Family History:  Family History  Problem Relation Age of Onset   CAD Mother    CAD Father      Prior to Admission medications   Medication Sig Start Date End Date Taking? Authorizing Provider  aspirin EC 81 MG tablet Take 81 mg by mouth daily.   Yes [provider]  bumetanide (BUMEX) 2 MG tablet Take 2 mg by mouth daily. 12/12/18 12/12/19 Yes [provider]  Calcium Carbonate-Vitamin D (CALCIUM 600+D) 600-400 MG-UNIT tablet Take 1 tablet by mouth 2 (two) times daily.   Yes [provider]  COMBIVENT RESPIMAT 20-100 MCG/ACT AERS respimat Inhale 2 puffs into the lungs 4 (four) times daily as needed. 01/08/19  Yes [provider]  diltiazem (CARDIZEM CD) 120 MG 24 hr capsule Take 120 mg by mouth daily. 01/07/19  Yes [provider]  isosorbide mononitrate (IMDUR) 30 MG 24 hr tablet Take 30 mg by mouth daily. 12/10/18  Yes [provider]  lovastatin (MEVACOR) 40 MG tablet Take 40 mg by mouth at bedtime. 01/06/19  Yes [provider]  metoprolol (LOPRESSOR) 100 MG tablet Take 100 mg by mouth 2 (two) times daily.   Yes [provider]    Physical Exam: Vitals:   01/15/19 1200 01/15/19 1530 01/15/19  1600 01/15/19 1630  BP: (!) 181/85 (!) 146/67 116/82 136/74  Pulse: 94   (!) 105  Resp: (!) 21 16 17 17   Temp:      TempSrc:      SpO2: 100%   97%   General: Not in acute distress HEENT:       Eyes: PERRL, EOMI, no scleral icterus.       ENT: No discharge from the ears and nose, no pharynx injection, no tonsillar enlargement.        Neck: No JVD, no bruit, no mass felt. Heme: No neck lymph node enlargement. Cardiac: S1/S2, irregular, No murmurs, No gallops or rubs. Respiratory: Good air movement bilaterally. mild rales at bases, no wheezing or rhonchi  GI: Soft, nondistended, nontender, no rebound pain, no organomegaly, BS present. GU: No hematuria Ext: No pitting leg edema bilaterally. 2+DP/PT pulse bilaterally. Musculoskeletal: No joint deformities, No joint redness or warmth, no limitation of ROM in spin. Skin: Ecchymosis over frontal chest, more at right-sided chest and right shoulder/arm.  Neuro: Alert, oriented X3, cranial nerves II-XII grossly intact, moves all extremities normally. Muscle strength 5/5 in all extremities, sensation to light touch intact.  Psych: Patient is not psychotic, no suicidal or hemocidal ideation.  Labs on Admission: I have personally reviewed following labs and imaging studies  CBC: Recent Labs  Lab 01/15/19 1006  WBC 9.0  NEUTROABS 7.3  HGB 11.5*  HCT 37.4  MCV 100.0  PLT 123XX123*   Basic Metabolic Panel: Recent Labs  Lab 01/15/19 1149  NA 141  K 4.2  CL 105  CO2 24  GLUCOSE 99  BUN 42*  CREATININE 0.98  CALCIUM 9.7   GFR: CrCl cannot be calculated (Unknown ideal weight.). Liver Function Tests: Recent Labs  Lab 01/15/19 1149  AST 65*  ALT 34  ALKPHOS 119  BILITOT 1.8*  PROT 7.2  ALBUMIN 4.0   No results for input(s): LIPASE, AMYLASE in the last 168 hours. No results for input(s): AMMONIA in the last 168 hours. Coagulation Profile: No results for input(s): INR, PROTIME in the last 168 hours. Cardiac Enzymes: Recent Labs    Lab 01/15/19 1149  CKTOTAL 1,926*   BNP (last 3 results) No results for input(s): PROBNP in the last 8760 hours. HbA1C: No results for input(s): HGBA1C in the last 72 hours. CBG: No results for input(s): GLUCAP in the last 168 hours. Lipid Profile: No results for input(s): CHOL, HDL, LDLCALC, TRIG, CHOLHDL, LDLDIRECT in the last 72 hours. Thyroid Function Tests: No results for input(s): TSH, T4TOTAL, FREET4, T3FREE, THYROIDAB in the last 72 hours. Anemia Panel: No results for input(s): VITAMINB12, FOLATE, FERRITIN, TIBC, IRON, RETICCTPCT in the last 72 hours. Urine analysis:    Component Value Date/Time   COLORURINE YELLOW (A) 01/15/2019 1215   APPEARANCEUR HAZY (A) 01/15/2019 1215   LABSPEC 1.021 01/15/2019 1215   PHURINE 5.0 01/15/2019 1215   GLUCOSEU NEGATIVE 01/15/2019 1215   HGBUR  NEGATIVE 01/15/2019 1215   BILIRUBINUR NEGATIVE 01/15/2019 1215   KETONESUR 5 (A) 01/15/2019 1215   PROTEINUR 30 (A) 01/15/2019 1215   NITRITE NEGATIVE 01/15/2019 1215   LEUKOCYTESUR NEGATIVE 01/15/2019 1215   Sepsis Labs: @LABRCNTIP (procalcitonin:4,lacticidven:4) )No results found for this or any previous visit (from the past 240 hour(s)).   Radiological Exams on Admission: Dg Chest 2 View  Result Date: 01/15/2019 CLINICAL DATA:  Multiple trauma secondary to a fall last night. Bruising to the right side of the chest and right shoulder. EXAM: CHEST - 2 VIEW COMPARISON:  None. FINDINGS: There are fractures of the anterolateral aspects of the right second and third ribs. No other acute bone abnormality. No pneumothorax or lung contusion or pleural effusion. Heart size and pulmonary vascularity are normal. Aortic atherosclerosis. No infiltrates. Chronic degenerative changes of the right shoulder consistent with chronic rotator cuff tear. IMPRESSION: Fractures of the anterolateral aspects of the right second and third ribs. No pneumothorax or pleural effusion. Aortic Atherosclerosis (ICD10-I70.0).  Electronically Signed   By: Lorriane Shire M.D.   On: 01/15/2019 11:26   Dg Shoulder Right  Result Date: 01/15/2019 CLINICAL DATA:  Right shoulder pain and bruising secondary to a fall last night. EXAM: RIGHT SHOULDER - 2+ VIEW COMPARISON:  None. FINDINGS: There are slightly displaced fractures of the anterolateral aspects of the right second and third ribs. There is no fracture or dislocation of the right shoulder. Narrowing of the subacromial space consistent with chronic rotator cuff tear. Osteopenia. IMPRESSION: 1. Slightly displaced fractures of the anterolateral aspects of the right second and third ribs. 2. Chronic rotator cuff tear. 3. No acute abnormality of the right shoulder. Electronically Signed   By: Lorriane Shire M.D.   On: 01/15/2019 11:28   Dg Elbow 2 Views Right  Result Date: 01/15/2019 CLINICAL DATA:  Right elbow pain secondary to a fall last night. EXAM: RIGHT ELBOW - 2 VIEW COMPARISON:  None. FINDINGS: There is a distracted fracture of the olecranon process of the proximal ulna. Hemarthrosis. Distal humerus and proximal radius are intact. IMPRESSION: 1. Acute distracted fracture of the olecranon process of the proximal ulna. 2. Hemarthrosis. Electronically Signed   By: Lorriane Shire M.D.   On: 01/15/2019 11:27   Ct Head Wo Contrast  Result Date: 01/15/2019 CLINICAL DATA:  Multiple falls, head trauma EXAM: CT HEAD WITHOUT CONTRAST TECHNIQUE: Contiguous axial images were obtained from the base of the skull through the vertex without intravenous contrast. COMPARISON:  None. FINDINGS: Brain: No evidence of acute infarction, hemorrhage, hydrocephalus, extra-axial collection or mass lesion/mass effect. Scattered low-density changes within the periventricular and subcortical white matter compatible with chronic microvascular ischemic change. Mild diffuse cerebral volume loss. Vascular: Mild atherosclerotic calcifications involving the large vessels of the skull base. No unexpected  hyperdense vessel. Skull: Normal. Negative for fracture or focal lesion. Sinuses/Orbits: No acute finding. Other: None. IMPRESSION: 1.  No acute intracranial findings. 2.  Chronic microvascular ischemic change and cerebral volume loss. Electronically Signed   By: Davina Poke M.D.   On: 01/15/2019 10:53   Ct Cervical Spine Wo Contrast  Result Date: 01/15/2019 CLINICAL DATA:  Neck pain. Multiple falls EXAM: CT CERVICAL SPINE WITHOUT CONTRAST TECHNIQUE: Multidetector CT imaging of the cervical spine was performed without intravenous contrast. Multiplanar CT image reconstructions were also generated. COMPARISON:  None. FINDINGS: Alignment: Straightening of the cervical lordosis. Facet joints are aligned. No traumatic listhesis. Skull base and vertebrae: No acute fracture. No primary bone lesion or focal pathologic process.  Soft tissues and spinal canal: No prevertebral fluid or swelling. No visible canal hematoma. Disc levels: Marked degenerative changes at the atlantoaxial articulation with prominent subchondral cysts versus erosions and large soft tissue pannus posterior to the dens resulting in posterior displacement and mass effect on the thecal sac (series 3, image 14; series 6, image 22). Multilevel intervertebral disc height loss, severe at the C4 through C7 levels. Multilevel facet and uncovertebral arthropathy. There is bony ankylosis of the left facet joints at C2-3 and C3-4. At least moderate canal stenosis at the C5-6 level. Severe multilevel foraminal stenosis, most pronounced at C3-4 and C4-5 on the left. Upper chest: Trace left pleural effusion. Other: None. IMPRESSION: 1. No acute cervical spine fracture or posttraumatic subluxation. 2. Marked chronic degenerative changes at the atlantoaxial articulation with prominent subchondral cysts versus erosions and large soft tissue pannus posterior to the dens resulting in posterior displacement and mass effect on the thecal sac. 3. Multilevel  degenerative disc and facet arthropathy. 4. Severe multilevel foraminal stenosis, most pronounced at C3-4 and C4-5 on the left. 5. Trace left pleural effusion. Electronically Signed   By: Davina Poke M.D.   On: 01/15/2019 11:02     EKG: Independently reviewed.     Assessment/Plan Principal Problem:   Closed fracture dislocation of right elbow Active Problems:   Closed rib fracture   HTN (hypertension), benign   Chronic systolic CHF (congestive heart failure) (HCC)   CAD (coronary artery disease)   Atrial fibrillation, chronic (HCC)   Rhabdomyolysis   KIMMORA RAKER is a 85 y.o. female with medical history significant of HTN, CAD, s/p CABG, sCHF (EF 30%), A-fib not on AC, h/o breast cancer, presented after multiple falls at home.   Acute distracted fracture of the olecranon process of the proximal ulna Ortho consulted and plan for surgery tomorrow coags and type screen ordered N.p.o. after midnight Cardiology called for clearance given CHF  2nd and 3rd rib fracture Incentive spirometry Pain management  Rhabdomyolysis CK elevated Give IV fluid with caution  Elevated troponin Patient denies chest pain Likely related to rhabdomyolysis or demanding  HTN, CAD, s/p CABG, sCHF (EF 30%), A-fib not on AC Continue with diltiazem, metoprolol, isosorbide,  hold Bumex due to rhabdomyolysis Monitor BP and heart rate  DVT ppx: SCD Code Status: DNR Family Communication: daugher at bed side.               Disposition Plan:  To be decided Consults called: orthopedics and cardiology Admission status:  medical floor/inpation       Date of Service 01/15/2019    [facility]   If 7PM-7AM, please contact night-coverage [email or web address] Password [facility] 01/15/2019, 5:33 PM

## 2019-01-16 ENCOUNTER — Inpatient Hospital Stay: Payer: Medicare Other | Admitting: Anesthesiology

## 2019-01-16 ENCOUNTER — Ambulatory Visit: Payer: Medicare Other

## 2019-01-16 ENCOUNTER — Inpatient Hospital Stay: Payer: Medicare Other

## 2019-01-16 ENCOUNTER — Encounter
Admission: EM | Disposition: A | Discharge status: Skilled Nursing Facility | Payer: Self-pay | Source: Home / Self Care | Attending: Internal Medicine

## 2019-01-16 ENCOUNTER — Other Ambulatory Visit: Payer: Self-pay

## 2019-01-16 DIAGNOSIS — T796XXA Traumatic ischemia of muscle, initial encounter: Secondary | ICD-10-CM

## 2019-01-16 DIAGNOSIS — S42401A Unspecified fracture of lower end of right humerus, initial encounter for closed fracture: Secondary | ICD-10-CM

## 2019-01-16 DIAGNOSIS — R531 Weakness: Secondary | ICD-10-CM

## 2019-01-16 DIAGNOSIS — I482 Chronic atrial fibrillation, unspecified: Secondary | ICD-10-CM

## 2019-01-16 DIAGNOSIS — I25810 Atherosclerosis of coronary artery bypass graft(s) without angina pectoris: Secondary | ICD-10-CM

## 2019-01-16 DIAGNOSIS — S2241XA Multiple fractures of ribs, right side, initial encounter for closed fracture: Secondary | ICD-10-CM

## 2019-01-16 DIAGNOSIS — I5022 Chronic systolic (congestive) heart failure: Secondary | ICD-10-CM

## 2019-01-16 DIAGNOSIS — S52021A Displaced fracture of olecranon process without intraarticular extension of right ulna, initial encounter for closed fracture: Secondary | ICD-10-CM

## 2019-01-16 DIAGNOSIS — W19XXXA Unspecified fall, initial encounter: Secondary | ICD-10-CM

## 2019-01-16 DIAGNOSIS — I10 Essential (primary) hypertension: Secondary | ICD-10-CM

## 2019-01-16 HISTORY — PX: ORIF ELBOW FRACTURE: SHX5031

## 2019-01-16 LAB — CBC
HCT: 31.3 % — ABNORMAL LOW (ref 36.0–46.0)
Hemoglobin: 9.7 g/dL — ABNORMAL LOW (ref 12.0–15.0)
MCH: 30.7 pg (ref 26.0–34.0)
MCHC: 31 g/dL (ref 30.0–36.0)
MCV: 99.1 fL (ref 80.0–100.0)
Platelets: 109 10*3/uL — ABNORMAL LOW (ref 150–400)
RBC: 3.16 MIL/uL — ABNORMAL LOW (ref 3.87–5.11)
RDW: 15 % (ref 11.5–15.5)
WBC: 6.1 10*3/uL (ref 4.0–10.5)
nRBC: 0 % (ref 0.0–0.2)

## 2019-01-16 LAB — BASIC METABOLIC PANEL
Anion gap: 8 (ref 5–15)
BUN: 44 mg/dL — ABNORMAL HIGH (ref 8–23)
CO2: 25 mmol/L (ref 22–32)
Calcium: 9.3 mg/dL (ref 8.9–10.3)
Chloride: 108 mmol/L (ref 98–111)
Creatinine, Ser: 0.94 mg/dL (ref 0.44–1.00)
GFR calc Af Amer: >60 mL/min (ref >60)
GFR calc non Af Amer: 52 mL/min — ABNORMAL LOW (ref >60)
Glucose, Bld: 86 mg/dL (ref 70–99)
Potassium: 3.9 mmol/L (ref 3.5–5.1)
Sodium: 141 mmol/L (ref 135–145)

## 2019-01-16 LAB — TYPE AND SCREEN
ABO/RH(D): O POS
Antibody Screen: NEGATIVE

## 2019-01-16 LAB — CK: Total CK: 790 U/L — ABNORMAL HIGH (ref 38–234)

## 2019-01-16 LAB — APTT: aPTT: 39 seconds — ABNORMAL HIGH (ref 24–36)

## 2019-01-16 LAB — SURGICAL PCR SCREEN
MRSA, PCR: NEGATIVE
Staphylococcus aureus: NEGATIVE

## 2019-01-16 LAB — PROTIME-INR
INR: 1.3 — ABNORMAL HIGH (ref 0.8–1.2)
Prothrombin Time: 16.1 seconds — ABNORMAL HIGH (ref 11.4–15.2)

## 2019-01-16 LAB — SARS CORONAVIRUS 2 (TAT 6-24 HRS): SARS Coronavirus 2: NEGATIVE

## 2019-01-16 IMAGING — DX DG ELBOW 2V*R*
3 series · 3 of 3 positions shown · non-contrast
Comparison: Right elbow x-rays from yesterday.

CLINICAL DATA: Olecranon ORIF.

EXAM:
RIGHT ELBOW - 2 VIEW

[elbow ap (1 of 2)]
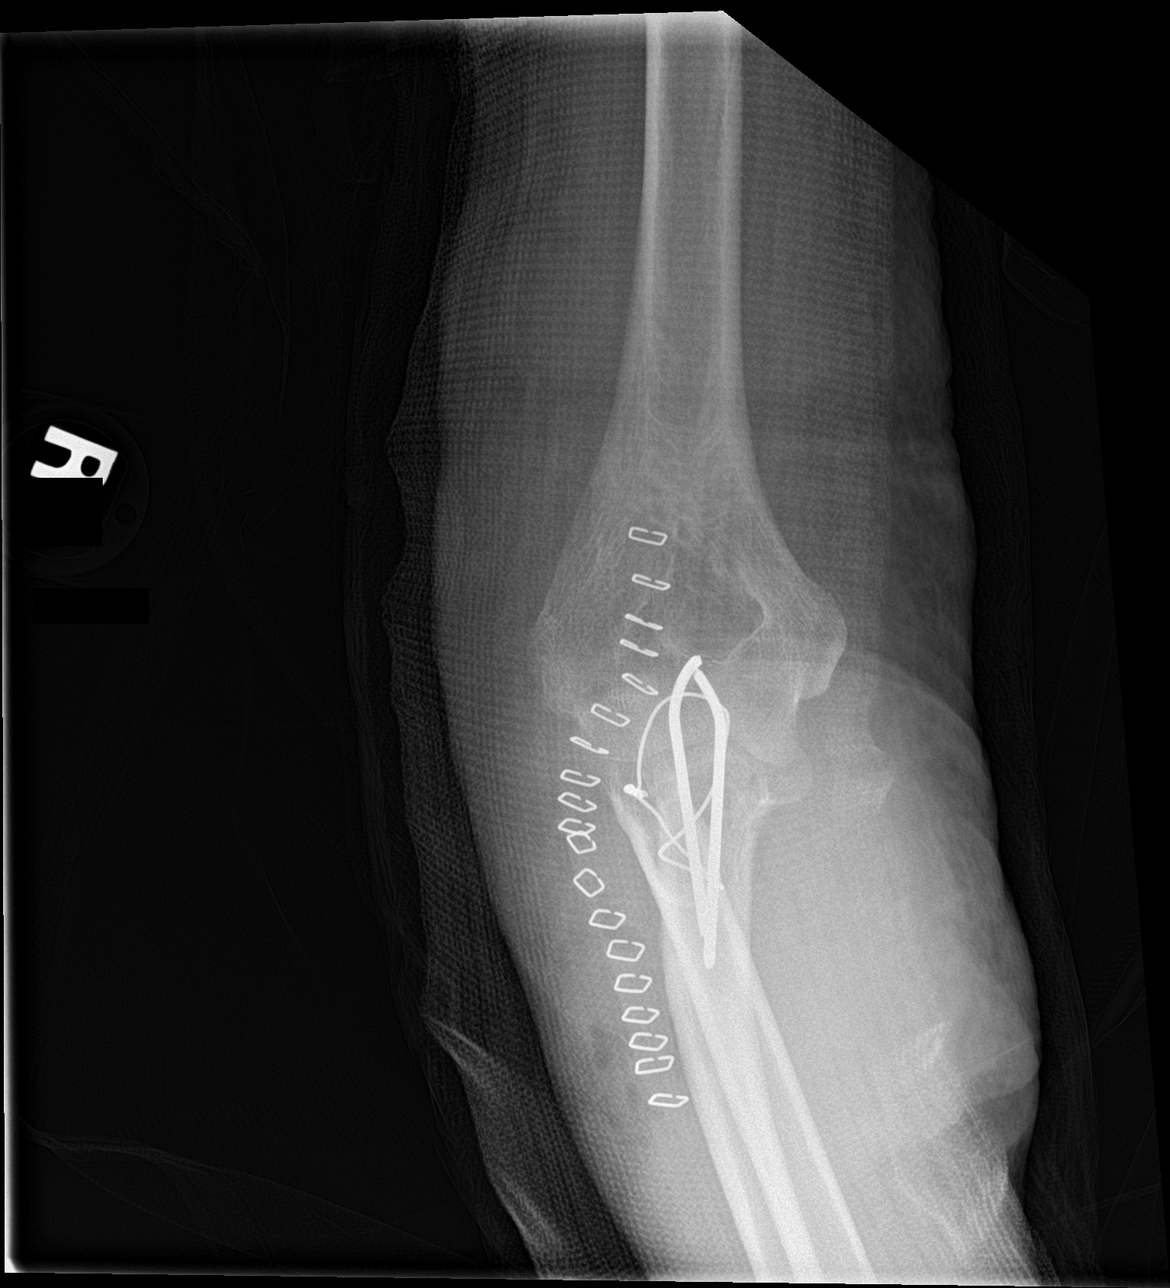

[elbow lat]
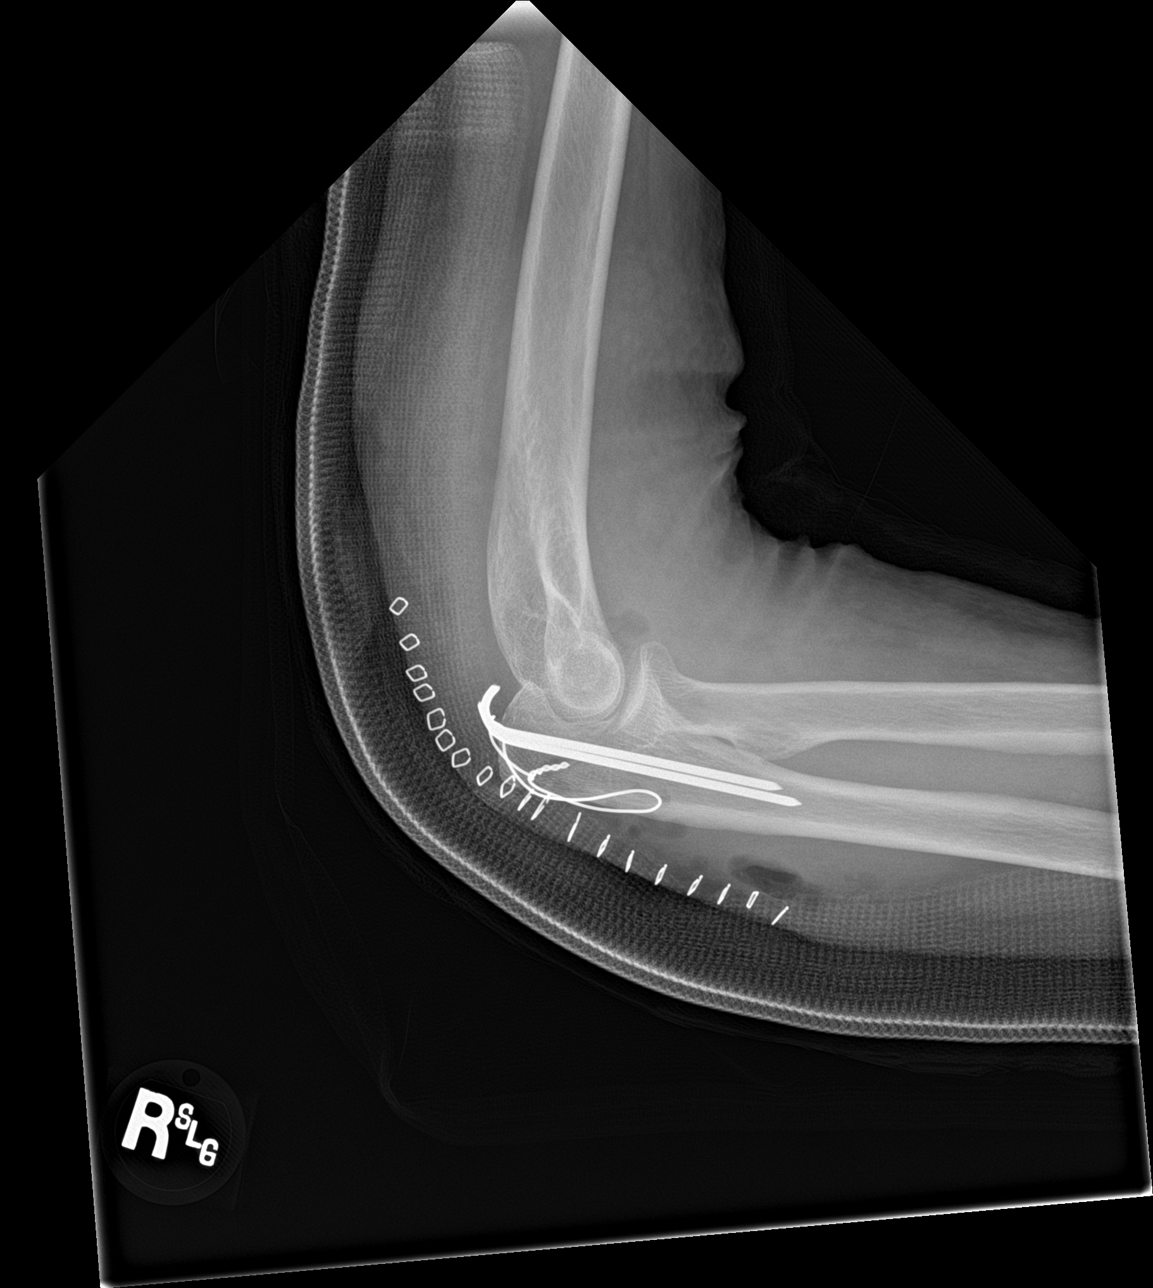

[elbow ap (2 of 2)]
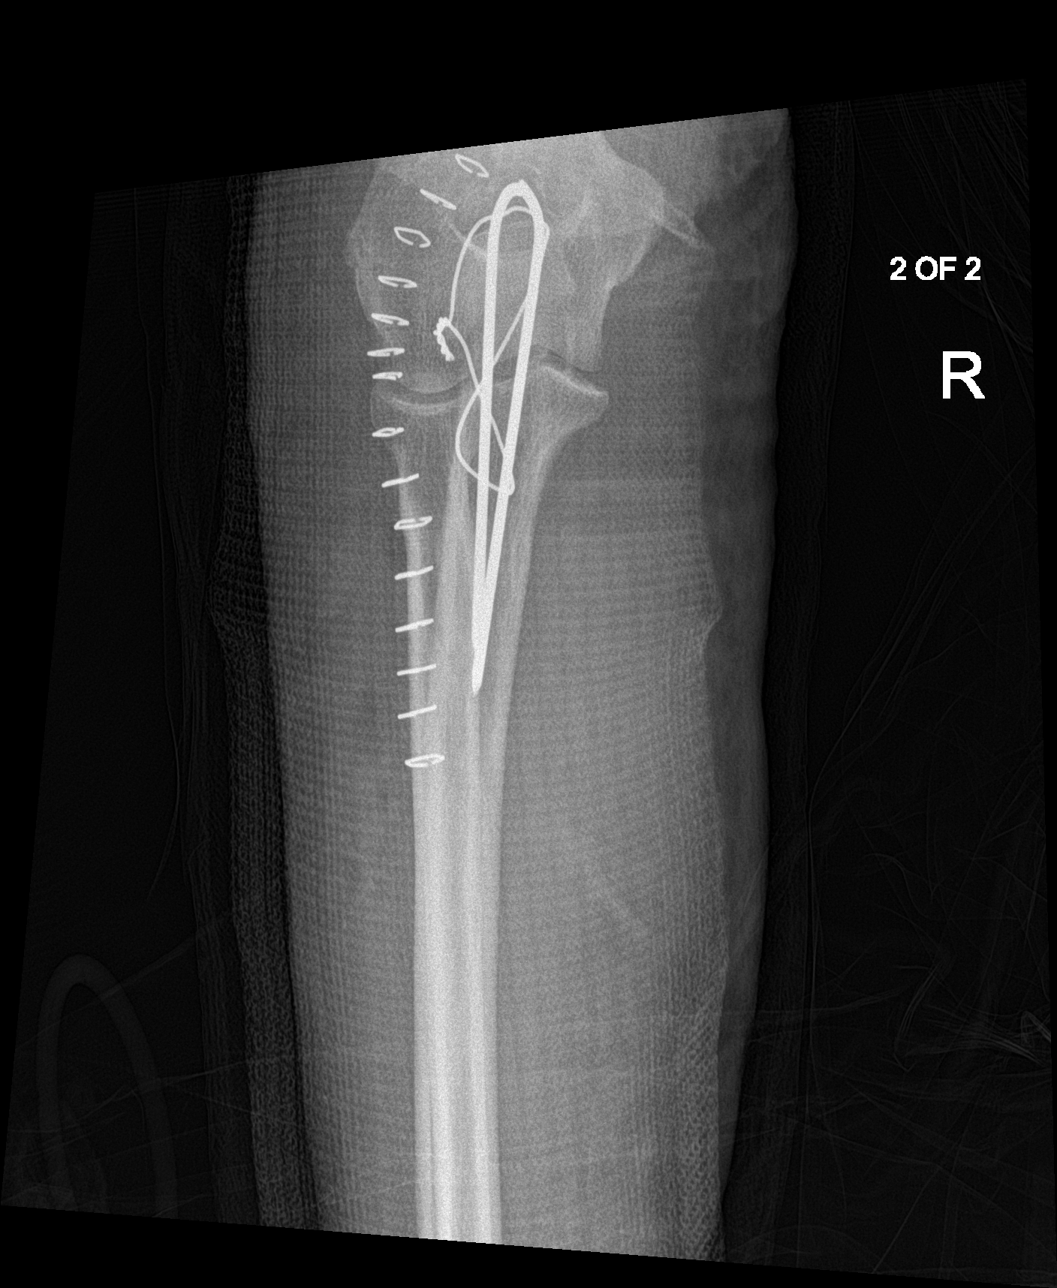

[3 of 3 positions shown; findings below may reference images not displayed]

FINDINGS: Interval ORIF of the olecranon fracture with 2 K-wires and a
cerclage wire. Alignment is normal. Small amount of expected
intra-articular air and posterior subcutaneous emphysema.
Osteopenia.
IMPRESSION: Interval olecranon ORIF, now in normal anatomic alignment.

## 2019-01-16 SURGERY — OPEN REDUCTION INTERNAL FIXATION (ORIF) ELBOW/OLECRANON FRACTURE
Anesthesia: General | Site: Elbow | Laterality: Right

## 2019-01-16 MED ORDER — BUPIVACAINE HCL (PF) 0.5 % IJ SOLN
INTRAMUSCULAR | Status: AC
  Filled 2019-01-16: qty 30

## 2019-01-16 MED ORDER — ETOMIDATE 2 MG/ML IV SOLN
INTRAVENOUS | Status: AC
  Filled 2019-01-16: qty 10

## 2019-01-16 MED ORDER — METHOCARBAMOL 500 MG PO TABS
500.0000 mg | ORAL_TABLET | Freq: Four times a day (QID) | ORAL | Status: DC | PRN
  Administered 2019-01-17: 500 mg via ORAL
  Filled 2019-01-16: qty 1

## 2019-01-16 MED ORDER — FENTANYL CITRATE (PF) 100 MCG/2ML IJ SOLN
50.0000 ug | INTRAMUSCULAR | Status: DC | PRN
  Administered 2019-01-16: 25 ug via INTRAVENOUS

## 2019-01-16 MED ORDER — FENTANYL CITRATE (PF) 100 MCG/2ML IJ SOLN: 25.0000 ug | INTRAMUSCULAR | Status: DC | PRN

## 2019-01-16 MED ORDER — PROPOFOL 500 MG/50ML IV EMUL
INTRAVENOUS | Status: DC | PRN
  Administered 2019-01-16: 15 ug/kg/min via INTRAVENOUS

## 2019-01-16 MED ORDER — CLINDAMYCIN PHOSPHATE 600 MG/50ML IV SOLN
600.0000 mg | Freq: Three times a day (TID) | INTRAVENOUS | Status: AC
  Administered 2019-01-16 – 2019-01-17 (×3): 600 mg via INTRAVENOUS
  Filled 2019-01-16 (×3): qty 50

## 2019-01-16 MED ORDER — ONDANSETRON HCL 4 MG/2ML IJ SOLN: 4.0000 mg | Freq: Four times a day (QID) | INTRAMUSCULAR | Status: DC | PRN

## 2019-01-16 MED ORDER — MIDAZOLAM HCL 2 MG/2ML IJ SOLN: 1.0000 mg | INTRAMUSCULAR | Status: DC | PRN

## 2019-01-16 MED ORDER — BUPIVACAINE HCL (PF) 0.5 % IJ SOLN
INTRAMUSCULAR | Status: DC | PRN
  Administered 2019-01-16 (×3): 10 mL

## 2019-01-16 MED ORDER — DOCUSATE SODIUM 100 MG PO CAPS
100.0000 mg | ORAL_CAPSULE | Freq: Two times a day (BID) | ORAL | Status: DC
  Administered 2019-01-16 – 2019-01-21 (×10): 100 mg via ORAL
  Filled 2019-01-16 (×10): qty 1

## 2019-01-16 MED ORDER — NEOMYCIN-POLYMYXIN B GU 40-200000 IR SOLN
Status: AC
  Filled 2019-01-16: qty 20

## 2019-01-16 MED ORDER — METOCLOPRAMIDE HCL 5 MG/ML IJ SOLN: 5.0000 mg | Freq: Three times a day (TID) | INTRAMUSCULAR | Status: DC | PRN

## 2019-01-16 MED ORDER — METOCLOPRAMIDE HCL 10 MG PO TABS: 5.0000 mg | ORAL_TABLET | Freq: Three times a day (TID) | ORAL | Status: DC | PRN

## 2019-01-16 MED ORDER — FENTANYL CITRATE (PF) 100 MCG/2ML IJ SOLN
INTRAMUSCULAR | Status: AC
  Filled 2019-01-16: qty 2

## 2019-01-16 MED ORDER — SODIUM CHLORIDE 0.45 % IV SOLN
INTRAVENOUS | Status: DC
  Administered 2019-01-16 – 2019-01-17 (×2): via INTRAVENOUS

## 2019-01-16 MED ORDER — PHENYLEPHRINE HCL (PRESSORS) 10 MG/ML IV SOLN
INTRAVENOUS | Status: DC | PRN
  Administered 2019-01-16 (×3): 50 ug via INTRAVENOUS

## 2019-01-16 MED ORDER — BISACODYL 10 MG RE SUPP
10.0000 mg | Freq: Every day | RECTAL | Status: DC | PRN
  Filled 2019-01-16: qty 1

## 2019-01-16 MED ORDER — ONDANSETRON HCL 4 MG PO TABS: 4.0000 mg | ORAL_TABLET | Freq: Four times a day (QID) | ORAL | Status: DC | PRN

## 2019-01-16 MED ORDER — LIDOCAINE HCL (PF) 1 % IJ SOLN
INTRAMUSCULAR | Status: AC
  Filled 2019-01-16: qty 5

## 2019-01-16 MED ORDER — CLINDAMYCIN PHOSPHATE 600 MG/50ML IV SOLN
INTRAVENOUS | Status: AC
  Filled 2019-01-16: qty 50

## 2019-01-16 MED ORDER — LIDOCAINE HCL (PF) 1 % IJ SOLN
INTRAMUSCULAR | Status: DC | PRN
  Administered 2019-01-16: 1 mL

## 2019-01-16 MED ORDER — METHOCARBAMOL 1000 MG/10ML IJ SOLN
500.0000 mg | Freq: Four times a day (QID) | INTRAVENOUS | Status: DC | PRN
  Filled 2019-01-16: qty 5

## 2019-01-16 MED ORDER — FLEET ENEMA 7-19 GM/118ML RE ENEM: 1.0000 | ENEMA | Freq: Once | RECTAL | Status: DC | PRN

## 2019-01-16 MED ORDER — BUPIVACAINE HCL 0.25 % IJ SOLN
INTRAMUSCULAR | Status: DC | PRN
  Administered 2019-01-16: 30 mL

## 2019-01-16 MED ORDER — LIDOCAINE HCL (PF) 2 % IJ SOLN
INTRAMUSCULAR | Status: AC
  Filled 2019-01-16: qty 10

## 2019-01-16 MED ORDER — NEOMYCIN-POLYMYXIN B GU 40-200000 IR SOLN
Status: DC | PRN
  Administered 2019-01-16: 4 mL

## 2019-01-16 MED ORDER — MAGNESIUM HYDROXIDE 400 MG/5ML PO SUSP
30.0000 mL | Freq: Every day | ORAL | Status: DC | PRN
  Administered 2019-01-16 – 2019-01-20 (×2): 30 mL via ORAL
  Filled 2019-01-16 (×2): qty 30

## 2019-01-16 MED ORDER — CEFAZOLIN SODIUM-DEXTROSE 2-4 GM/100ML-% IV SOLN
2.0000 g | Freq: Three times a day (TID) | INTRAVENOUS | Status: AC
  Administered 2019-01-16 – 2019-01-17 (×3): 2 g via INTRAVENOUS
  Filled 2019-01-16 (×3): qty 100

## 2019-01-16 MED ORDER — ENOXAPARIN SODIUM 30 MG/0.3ML ~~LOC~~ SOLN
30.0000 mg | SUBCUTANEOUS | Status: DC
  Administered 2019-01-17 – 2019-01-21 (×5): 30 mg via SUBCUTANEOUS
  Filled 2019-01-16 (×5): qty 0.3

## 2019-01-16 MED ORDER — BUPIVACAINE HCL (PF) 0.25 % IJ SOLN
INTRAMUSCULAR | Status: AC
  Filled 2019-01-16: qty 30

## 2019-01-16 MED ORDER — CEFAZOLIN SODIUM-DEXTROSE 1-4 GM/50ML-% IV SOLN
INTRAVENOUS | Status: AC
  Filled 2019-01-16: qty 50

## 2019-01-16 SURGICAL SUPPLY — 38 items
BIT DRILL QC 2X125 (BIT) IMPLANT
BNDG COHESIVE 4X5 TAN STRL (GAUZE/BANDAGES/DRESSINGS) ×3 IMPLANT
BNDG ESMARK 4X12 TAN STRL LF (GAUZE/BANDAGES/DRESSINGS) ×3 IMPLANT
CANISTER SUCT 1200ML W/VALVE (MISCELLANEOUS) ×3 IMPLANT
CHLORAPREP W/TINT 26 (MISCELLANEOUS) ×3 IMPLANT
COVER WAND RF STERILE (DRAPES) ×3 IMPLANT
CUFF TOURN SGL QUICK 18X4 (TOURNIQUET CUFF) ×2 IMPLANT
CUFF TOURN SGL QUICK 24 (TOURNIQUET CUFF)
CUFF TRNQT CYL 24X4X16.5-23 (TOURNIQUET CUFF) IMPLANT
DRAPE C-ARM XRAY 36X54 (DRAPES) ×1 IMPLANT
DRAPE FLUOR MINI C-ARM 54X84 (DRAPES) ×2 IMPLANT
DRILL BIT QC 2X125 (BIT) ×2
ELECT REM PT RETURN 9FT ADLT (ELECTROSURGICAL) ×3
ELECTRODE REM PT RTRN 9FT ADLT (ELECTROSURGICAL) ×1 IMPLANT
GAUZE SPONGE 4X4 12PLY STRL (GAUZE/BANDAGES/DRESSINGS) ×3 IMPLANT
GAUZE XEROFORM 1X8 LF (GAUZE/BANDAGES/DRESSINGS) ×3 IMPLANT
GLOVE INDICATOR 8.0 STRL GRN (GLOVE) ×3 IMPLANT
GLOVE SURG ORTHO 8.5 STRL (GLOVE) ×3 IMPLANT
GOWN STRL REUS W/ TWL LRG LVL3 (GOWN DISPOSABLE) ×1 IMPLANT
GOWN STRL REUS W/TWL LRG LVL3 (GOWN DISPOSABLE) ×2
GOWN STRL REUS W/TWL LRG LVL4 (GOWN DISPOSABLE) ×3 IMPLANT
HANDLE YANKAUER SUCT BULB TIP (MISCELLANEOUS) ×3 IMPLANT
KIT TURNOVER KIT A (KITS) ×3 IMPLANT
LOOP VESSEL SUPERMAXI WHITE (MISCELLANEOUS) ×3 IMPLANT
NS IRRIG 1000ML POUR BTL (IV SOLUTION) ×3 IMPLANT
PACK EXTREMITY ARMC (MISCELLANEOUS) ×3 IMPLANT
PADDING CAST 4IN STRL (MISCELLANEOUS) ×4
PADDING CAST BLEND 4X4 STRL (MISCELLANEOUS) ×2 IMPLANT
SLING ARM LRG DEEP (SOFTGOODS) ×1 IMPLANT
SLING ARM M TX990204 (SOFTGOODS) ×3 IMPLANT
SPONGE LAP 18X18 RF (DISPOSABLE) ×3 IMPLANT
STAPLER SKIN PROX 35W (STAPLE) ×3 IMPLANT
STOCKINETTE BIAS CUT 4 980044 (GAUZE/BANDAGES/DRESSINGS) ×3 IMPLANT
STOCKINETTE IMPERVIOUS 9X36 MD (GAUZE/BANDAGES/DRESSINGS) ×3 IMPLANT
SUT QUILL 0 20X36 (SUTURE) ×2 IMPLANT
SUT QUILL PDO 0 36 36 VIOLET (SUTURE) IMPLANT
SUT VIC AB 2-0 CT1 36 (SUTURE) ×3 IMPLANT
SUT VICRYL 3-0 27IN (SUTURE) ×6 IMPLANT

## 2019-01-16 NOTE — Anesthesia Procedure Notes (Addendum)
Anesthesia Regional Block: Supraclavicular block   Pre-Anesthetic Checklist: ,, timeout performed, Correct Patient, Correct Site, Correct Laterality, Correct Procedure, Correct Position, site marked, Risks and benefits discussed,  Surgical consent,  Pre-op evaluation,  At surgeon's request and post-op pain management  Laterality: Right  Prep: chloraprep       Needles:  Injection technique: Single-shot  Needle Type: Stimiplex     Needle Length: 9cm  Needle Gauge: 21     Additional Needles:   Procedures:,,,, ultrasound used (permanent image in chart),,,,  Narrative:  Start time: 01/16/2019 12:00 PM End time: 01/16/2019 12:10 PM  Performed by: Personally  Anesthesiologist: Durenda Hurt, MD  Additional Notes: At one point there was a positive aspiration, needle repositioned, then negative aspiration. Dose given in divided aliquots. No paresthesias. Pt tolerated the procedure well with not apparent complications.  KLF

## 2019-01-16 NOTE — OR Nursing (Signed)
Pt. Has sudden onset of trashing around and c/o difficulty breathing. Pt. Placed back on nasal cannula at 3 LPM. CRNA at [facility] with pt. Pt. Stated her SOB decreased with O@. Pt. Taken off O@ upon departure and Sats 100%. Pt. Calm upon departure.

## 2019-01-16 NOTE — Consult Note (Signed)
ORTHOPAEDIC CONSULTATION  REQUESTING PHYSICIAN: Lorella Nimrod, MD  Chief Complaint: Right elbow pain  HPI: Sheryl Suarez is a 85 y.o. female who complains of right elbow pain after a fall at home 2 nights ago.  Patient laid on the floor overnight and was seen in the emergency room yesterday.  She is admitted for mild rhabdomyolysis and a displaced right olecranon fracture.  She had other bruising as well.  I advised the patient that surgical fixation of the elbow is indicated to restore function.  Risks and benefits and postop protocol were discussed with her.  Request we will proceed with surgery.  Past Medical History:  Diagnosis Date  . Breast cancer ([place])    remission  . Hypertension    Past Surgical History:  Procedure Laterality Date  . ABDOMINAL HYSTERECTOMY    . APPENDECTOMY    . BREAST IMPLANT EXCHANGE    . CHOLECYSTECTOMY    . ESOPHAGOGASTRODUODENOSCOPY (EGD) WITH PROPOFOL N/A 08/20/2015   Procedure: ESOPHAGOGASTRODUODENOSCOPY (EGD) WITH PROPOFOL;  Surgeon: Lollie Sails, MD;  Location: [facility]COPY;  Service: Endoscopy;  Laterality: N/A;  . MASTECTOMY Bilateral    Social History   Socioeconomic History  . Marital status: Widowed    Spouse name: Not on file  . Number of children: Not on file  . Years of education: Not on file  . Highest education level: Not on file  Occupational History  . Not on file  Social Needs  . Financial resource strain: Not on file  . Food insecurity    Worry: Not on file    Inability: Not on file  . Transportation needs    Medical: Not on file    Non-medical: Not on file  Tobacco Use  . Smoking status: Never Smoker  . Smokeless tobacco: Never Used  Substance and Sexual Activity  . Alcohol use: No  . Drug use: Not on file  . Sexual activity: Not on file  Lifestyle  . Physical activity    Days per week: Not on file    Minutes per session: Not on file  . Stress: Not on file  Relationships  . Social Herbalist  on phone: Not on file    Gets together: Not on file    Attends religious service: Not on file    Active member of club or organization: Not on file    Attends meetings of clubs or organizations: Not on file    Relationship status: Not on file  Other Topics Concern  . Not on file  Social History Narrative  . Not on file   Family History  Problem Relation Age of Onset  . CAD Mother   . CAD Father    No Known Allergies Prior to Admission medications   Medication Sig Start Date End Date Taking? Authorizing Provider  aspirin EC 81 MG tablet Take 81 mg by mouth daily.   Yes [provider]  bumetanide (BUMEX) 2 MG tablet Take 2 mg by mouth daily. 12/12/18 12/12/19 Yes [provider]  Calcium Carbonate-Vitamin D (CALCIUM 600+D) 600-400 MG-UNIT tablet Take 1 tablet by mouth 2 (two) times daily.   Yes [provider]  COMBIVENT RESPIMAT 20-100 MCG/ACT AERS respimat Inhale 2 puffs into the lungs 4 (four) times daily as needed. 01/08/19  Yes [provider]  diltiazem (CARDIZEM CD) 120 MG 24 hr capsule Take 120 mg by mouth daily. 01/07/19  Yes [provider]  isosorbide mononitrate (IMDUR) 30 MG 24  hr tablet Take 30 mg by mouth daily. 12/10/18  Yes [provider]  lovastatin (MEVACOR) 40 MG tablet Take 40 mg by mouth at bedtime. 01/06/19  Yes [provider]  metoprolol (LOPRESSOR) 100 MG tablet Take 100 mg by mouth 2 (two) times daily.   Yes [provider]   Dg Chest 2 View  Result Date: 01/15/2019 CLINICAL DATA:  Multiple trauma secondary to a fall last night. Bruising to the right side of the chest and right shoulder. EXAM: CHEST - 2 VIEW COMPARISON:  None. FINDINGS: There are fractures of the anterolateral aspects of the right second and third ribs. No other acute bone abnormality. No pneumothorax or lung contusion or pleural effusion. Heart size and pulmonary vascularity are normal. Aortic atherosclerosis. No  infiltrates. Chronic degenerative changes of the right shoulder consistent with chronic rotator cuff tear. IMPRESSION: Fractures of the anterolateral aspects of the right second and third ribs. No pneumothorax or pleural effusion. Aortic Atherosclerosis (ICD10-I70.0). Electronically Signed   By: Lorriane Shire M.D.   On: 01/15/2019 11:26   Dg Shoulder Right  Result Date: 01/15/2019 CLINICAL DATA:  Right shoulder pain and bruising secondary to a fall last night. EXAM: RIGHT SHOULDER - 2+ VIEW COMPARISON:  None. FINDINGS: There are slightly displaced fractures of the anterolateral aspects of the right second and third ribs. There is no fracture or dislocation of the right shoulder. Narrowing of the subacromial space consistent with chronic rotator cuff tear. Osteopenia. IMPRESSION: 1. Slightly displaced fractures of the anterolateral aspects of the right second and third ribs. 2. Chronic rotator cuff tear. 3. No acute abnormality of the right shoulder. Electronically Signed   By: Lorriane Shire M.D.   On: 01/15/2019 11:28   Dg Elbow 2 Views Right  Result Date: 01/15/2019 CLINICAL DATA:  Right elbow pain secondary to a fall last night. EXAM: RIGHT ELBOW - 2 VIEW COMPARISON:  None. FINDINGS: There is a distracted fracture of the olecranon process of the proximal ulna. Hemarthrosis. Distal humerus and proximal radius are intact. IMPRESSION: 1. Acute distracted fracture of the olecranon process of the proximal ulna. 2. Hemarthrosis. Electronically Signed   By: Lorriane Shire M.D.   On: 01/15/2019 11:27   Ct Head Wo Contrast  Result Date: 01/15/2019 CLINICAL DATA:  Multiple falls, head trauma EXAM: CT HEAD WITHOUT CONTRAST TECHNIQUE: Contiguous axial images were obtained from the base of the skull through the vertex without intravenous contrast. COMPARISON:  None. FINDINGS: Brain: No evidence of acute infarction, hemorrhage, hydrocephalus, extra-axial collection or mass lesion/mass effect. Scattered  low-density changes within the periventricular and subcortical white matter compatible with chronic microvascular ischemic change. Mild diffuse cerebral volume loss. Vascular: Mild atherosclerotic calcifications involving the large vessels of the skull base. No unexpected hyperdense vessel. Skull: Normal. Negative for fracture or focal lesion. Sinuses/Orbits: No acute finding. Other: None. IMPRESSION: 1.  No acute intracranial findings. 2.  Chronic microvascular ischemic change and cerebral volume loss. Electronically Signed   By: Davina Poke M.D.   On: 01/15/2019 10:53   Ct Cervical Spine Wo Contrast  Result Date: 01/15/2019 CLINICAL DATA:  Neck pain. Multiple falls EXAM: CT CERVICAL SPINE WITHOUT CONTRAST TECHNIQUE: Multidetector CT imaging of the cervical spine was performed without intravenous contrast. Multiplanar CT image reconstructions were also generated. COMPARISON:  None. FINDINGS: Alignment: Straightening of the cervical lordosis. Facet joints are aligned. No traumatic listhesis. Skull base and vertebrae: No acute fracture. No primary bone lesion or focal pathologic process. Soft tissues and spinal  canal: No prevertebral fluid or swelling. No visible canal hematoma. Disc levels: Marked degenerative changes at the atlantoaxial articulation with prominent subchondral cysts versus erosions and large soft tissue pannus posterior to the dens resulting in posterior displacement and mass effect on the thecal sac (series 3, image 14; series 6, image 22). Multilevel intervertebral disc height loss, severe at the C4 through C7 levels. Multilevel facet and uncovertebral arthropathy. There is bony ankylosis of the left facet joints at C2-3 and C3-4. At least moderate canal stenosis at the C5-6 level. Severe multilevel foraminal stenosis, most pronounced at C3-4 and C4-5 on the left. Upper chest: Trace left pleural effusion. Other: None. IMPRESSION: 1. No acute cervical spine fracture or posttraumatic  subluxation. 2. Marked chronic degenerative changes at the atlantoaxial articulation with prominent subchondral cysts versus erosions and large soft tissue pannus posterior to the dens resulting in posterior displacement and mass effect on the thecal sac. 3. Multilevel degenerative disc and facet arthropathy. 4. Severe multilevel foraminal stenosis, most pronounced at C3-4 and C4-5 on the left. 5. Trace left pleural effusion. Electronically Signed   By: Davina Poke M.D.   On: 01/15/2019 11:02   [place] Or Nerve Block-image Only ([facility])  Result Date: 01/16/2019 There is no interpretation for this exam.  This order is for images obtained during a surgical procedure.  Please See "Surgeries" Tab for more information regarding the procedure.    Positive ROS: All other systems have been reviewed and were otherwise negative with the exception of those mentioned in the HPI and as above.  Physical Exam: General: Alert, no acute distress Cardiovascular: No pedal edema Respiratory: No cyanosis, no use of accessory musculature GI: No organomegaly, abdomen is soft and non-tender Skin: No lesions in the area of chief complaint Neurologic: Sensation intact distally Psychiatric: Patient is competent for consent with normal mood and affect Lymphatic: No axillary or cervical lymphadenopathy  MUSCULOSKELETAL: Right arm swollen and ecchymotic.  Tender over the elbow.  Unable to extend well.  The wrist has no pain.  There is some swelling in the fingers.  There is bruising up of the arm toward the shoulder but good motion of the shoulder.  No other extremities or joints are tender tender.  Assessment: Displaced right olecranon fracture  Plan: Open reduction internal fixation right olecranon fracture    Park Breed, MD [phone]   01/16/2019 12:32 PM

## 2019-01-16 NOTE — Anesthesia Postprocedure Evaluation (Signed)
Anesthesia Post Note  Patient: Sheryl Suarez  Procedure(s) Performed: OPEN REDUCTION INTERNAL FIXATION (ORIF) ELBOW/OLECRANON FRACTURE (Right Elbow)  Patient location during evaluation: PACU Anesthesia Type: General Level of consciousness: awake and alert Pain management: pain level controlled Vital Signs Assessment: post-procedure vital signs reviewed and stable Respiratory status: spontaneous breathing, nonlabored ventilation and respiratory function stable Cardiovascular status: blood pressure returned to baseline and stable Postop Assessment: no apparent nausea or vomiting Anesthetic complications: no     Last Vitals:  Vitals:   01/16/19 1502 01/16/19 1513  BP: (!) 157/70 (!) 150/89  Pulse: 92 96  Resp: 18 15  Temp: 36.7 C   SpO2: 97% 96%    Last Pain:  Vitals:   01/16/19 1502  TempSrc:   PainSc: 0-No pain                 Durenda Hurt

## 2019-01-16 NOTE — Transfer of Care (Signed)
Immediate Anesthesia Transfer of Care Note  Patient: ADRI SCHLOSS  Procedure(s) Performed: OPEN REDUCTION INTERNAL FIXATION (ORIF) ELBOW/OLECRANON FRACTURE (Right Elbow)  Patient Location: PACU  Anesthesia Type:MAC and Regional  Level of Consciousness: awake and alert   Airway & Oxygen Therapy: Patient Spontanous Breathing and Patient connected to face mask oxygen  Post-op Assessment: Report given to RN and Post -op Vital signs reviewed and stable  Post vital signs: Reviewed and stable  Last Vitals:  Vitals Value Taken Time  BP 160/99 01/16/19 1413  Temp    Pulse 79   Resp 14 01/16/19 1414  SpO2 100   Vitals shown include unvalidated device data.  Last Pain:  Vitals:   01/16/19 1223  TempSrc:   PainSc: 0-No pain         Complications: No apparent anesthesia complications

## 2019-01-16 NOTE — Op Note (Signed)
01/16/2019  2:15 PM  PATIENT:  Sheryl Suarez    PRE-OPERATIVE DIAGNOSIS:  Right Olecranon Fracture displaced  POST-OPERATIVE DIAGNOSIS:  Same  PROCEDURE:  OPEN REDUCTION INTERNAL FIXATION (ORIF) ELBOW/OLECRANON FRACTURE  SURGEON:  Park Breed, MD  ANESTHESIA:   Supraclavicular block and propofol  TOURNIQUET TIME: 55 MIN  COMPLICATIONS: None  PREOPERATIVE INDICATIONS:  Sheryl Suarez is a  85 y.o. female with a diagnosis of Right Olecranon Fracture who failed conservative measures and elected for surgical management.    The risks benefits and alternatives were discussed with the patient preoperatively including but not limited to the risks of infection, bleeding, nerve injury, cardiopulmonary complications, the need for revision surgery, among others, and the patient was willing to proceed.  OPERATIVE IMPLANTS: 2 large K wires and a 20-gauge wire  OPERATIVE FINDINGS: Completely displaced fairly oblique fracture of the olecranon.  Very minimal comminution.  OPERATIVE PROCEDURE:  The patient was brought to the operating room after a  supraclavicular block, and underwent general LMA anesthesia.  The arm was prepped and draped sterilely and then exsanguinated.  Tourniquet was inflated to 300 mmHg.  A posterior incision was made starting along the proximal ulna curving laterally and then extending proximally up over the triceps.  Dissection was carried out sharply through subcutaneous tissue down to the posterior bony ridge of the ulna.  Soft tissue was elevated off medially and laterally off the ulna, carefully protecting the ulnar nerve.  The proximal fragment was isolated and the fracture site cleared of all debris and irrigated.  The arm was extended and the fracture was reduced anatomically.  A sweetheart clamp was used to maintain the reduction while the arm was flexed and 2 2 mm wires were drilled through the proximal fragment into the distal fragment.  Drill holes were made in the  ulnar shaft distal to the fracture site and a 20-gauge wire passed through these.  The wire was then woven in a figure-of-eight and was tightened on the lateral side of the ulna.  This provided excellent stability to the fracture in flexion and extension.  Fluoroscopy showed the C-wire and the figure-of-eight wire to be in excellent position and alignment.  The fracture was reduced anatomically.  The K-wires  were bent, cut, and buried into the triceps tendon.  The 20-gauge wire was cut, leaving a knot that was buried as well.  Final examination showed excellent range of motion  with no separation at the fracture site.  After final irrigation, the subcutaneous tissue was closed with 2-0 Vicryl.  The skin was closed with staples.  Quarter percent Marcaine plain was instilled into the soft tissues.  An 04 and soft dressing with a posterior splint was applied and allowed to harden at 90 of flexion.  Tourniquet was deflated with good return of blood flow to the hand.  Patient was awakened and after placing a sling on the arm.  She was taken to the PACU in good condition.  Park Breed, MD

## 2019-01-16 NOTE — OR Nursing (Signed)
Dr. Andree Elk present.

## 2019-01-16 NOTE — Progress Notes (Signed)
PROGRESS NOTE    Sheryl Suarez  [id number] DOB: 17-Nov-1923 DOA: 01/15/2019 PCP: Leonel Ramsay, MD   Brief Narrative:  Sheryl Suarez is a 85 y.o. female with medical history significant of HTN, CAD, s/p CABG, sCHF (EF 30%), A-fib not on [facility], h/o breast cancer, presented to ED after falls. Pt lives alone and recently had multiple falls. She fell at home yesterday afternoon and was assisted up.  She fell again at night round 8pm, not able to get up. She lied on the floor until this morning her daughter visited her.  She had large bruise on her left chest and shoulder.  Images of left elbow shows Acute distracted fracture of the olecranon process of the proximal ulna and some hemarthrosis.  CT head and C-spine was without any acute abnormality.  Chest x-ray and right shoulder x-ray shows mildly displaced fractures of anterolateral aspects of right second and third ribs. Patient was taken to the OR later today by orthopedic for ORIF of right elbow.  Subjective: Patient had no new complaints.  Experiencing some tingling and numbness of right hand.  She was waiting for her surgery which is scheduled around noon today.  Assessment & Plan:   Principal Problem:   Closed fracture dislocation of right elbow Active Problems:   Closed rib fracture   HTN (hypertension), benign   Chronic systolic CHF (congestive heart failure) (HCC)   CAD (coronary artery disease)   Atrial fibrillation, chronic (HCC)   Rhabdomyolysis  Acute distracted fracture of the olecranon process of the proximal ulna. Patient was taken to the OR for ORIF of right elbow by orthopedic today. -She will need PT/OT evaluation for frequent falls and possible placement as patient lives alone. -Pain management.  2nd and 3rd rib fracture. -Continue with incentive spirometry and pain management.  Rhabdomyolysis.  Most likely after remained on floor for long time. CK improving. -Continue monitoring. -Encourage increase p.o.  intake and IV fluids with caution as patient has systolic heart failure.  Elevated troponin.  Most likely demand.  Denies any chest pain or shortness of breath.  HTN, CAD, s/p CABG, sCHF (EF 30%), A-fib not on AC.  Cardiology evaluated her for surgical clearance.  Advising continuation of home diltiazem, metoprolol, isosorbide.  Blood pressure mildly elevated today. -Hold Bumex due to rhabdomyolysis. -Monitor volume status closely. -Not a candidate for anticoagulation due to frequent falls.  Objective: Vitals:   01/16/19 1205 01/16/19 1210 01/16/19 1218 01/16/19 1223  BP: (!) 154/76 (!) 155/79 (!) 157/73   Pulse: 100 (!) 101 (!) 107 (!) 102  Resp: 17 18 18 17   Temp:      TempSrc:      SpO2: 100% 100% 92% 100%  Weight:      Height:        Intake/Output Summary (Last 24 hours) at 01/16/2019 1347 Last data filed at 01/16/2019 0300 Gross per 24 hour  Intake 294.73 ml  Output --  Net 294.73 ml   Filed Weights   01/16/19 1054  Weight: 47.6 kg    Examination:  General exam: Appears calm and comfortable  Respiratory system: Clear to auscultation. Respiratory effort normal. Cardiovascular system: Irregularly irregular, no JVD, murmurs, rubs, gallops or clicks. No pedal edema. Gastrointestinal system: Abdomen is nondistended, soft and nontender. No organomegaly or masses felt. Normal bowel sounds heard. Central nervous system: Alert and oriented. No focal neurological deficits. Extremities: Right arm with Ace wrap, edematous hand, pulses intact. Skin: Multiple ecchymoses involving right shoulder and  chest. Psychiatry: Judgement and insight appear normal. Mood & affect appropriate.   DVT prophylaxis: SCDs Code Status: DNR Family Communication: No family at bedside. Disposition Plan: Pending improvement, most likely SNF.  Consultants:   Orthopedic  Cardiology  Procedures:  ORIF of right elbow.  Antimicrobials:  Cefazolin and clindamycin for surgery.  Data Reviewed: I  have personally reviewed following labs and imaging studies  CBC: Recent Labs  Lab 01/15/19 1006 01/16/19 0350  WBC 9.0 6.1  NEUTROABS 7.3  --   HGB 11.5* 9.7*  HCT 37.4 31.3*  MCV 100.0 99.1  PLT 108* 0000000*   Basic Metabolic Panel: Recent Labs  Lab 01/15/19 1149 01/16/19 0350  NA 141 141  K 4.2 3.9  CL 105 108  CO2 24 25  GLUCOSE 99 86  BUN 42* 44*  CREATININE 0.98 0.94  CALCIUM 9.7 9.3   GFR: Estimated Creatinine Clearance: 28.1 mL/min (by C-G formula based on SCr of 0.94 mg/dL). Liver Function Tests: Recent Labs  Lab 01/15/19 1149  AST 65*  ALT 34  ALKPHOS 119  BILITOT 1.8*  PROT 7.2  ALBUMIN 4.0   No results for input(s): LIPASE, AMYLASE in the last 168 hours. No results for input(s): AMMONIA in the last 168 hours. Coagulation Profile: Recent Labs  Lab 01/15/19 1854 01/16/19 0350  INR 1.3* 1.3*   Cardiac Enzymes: Recent Labs  Lab 01/15/19 1149 01/16/19 0350  CKTOTAL 1,926* 790*   BNP (last 3 results) No results for input(s): PROBNP in the last 8760 hours. HbA1C: No results for input(s): HGBA1C in the last 72 hours. CBG: No results for input(s): GLUCAP in the last 168 hours. Lipid Profile: No results for input(s): CHOL, HDL, LDLCALC, TRIG, CHOLHDL, LDLDIRECT in the last 72 hours. Thyroid Function Tests: No results for input(s): TSH, T4TOTAL, FREET4, T3FREE, THYROIDAB in the last 72 hours. Anemia Panel: No results for input(s): VITAMINB12, FOLATE, FERRITIN, TIBC, IRON, RETICCTPCT in the last 72 hours. Sepsis Labs: No results for input(s): PROCALCITON, LATICACIDVEN in the last 168 hours.  Recent Results (from the past 240 hour(s))  SARS CORONAVIRUS 2 (TAT 6-24 HRS) Nasopharyngeal Nasopharyngeal Swab     Status: None   Collection Time: 01/15/19  1:37 PM   Specimen: Nasopharyngeal Swab  Result Value Ref Range Status   SARS Coronavirus 2 NEGATIVE NEGATIVE Final    Comment: (NOTE) SARS-CoV-2 target nucleic acids are NOT DETECTED. The  SARS-CoV-2 RNA is generally detectable in upper and lower respiratory specimens during the acute phase of infection. Negative results do not preclude SARS-CoV-2 infection, do not rule out co-infections with other pathogens, and should not be used as the sole basis for treatment or other patient management decisions. Negative results must be combined with clinical observations, patient history, and epidemiological information. The expected result is Negative. Fact Sheet for Patients: SugarRoll.be Fact Sheet for Healthcare Providers: https://www.woods-mathews.com/ This test is not yet approved or cleared by the [place] FDA and  has been authorized for detection and/or diagnosis of SARS-CoV-2 by FDA under an Emergency Use Authorization (EUA). This EUA will remain  in effect (meaning this test can be used) for the duration of the COVID-19 declaration under Section 56 4(b)(1) of the Act, 21 U.S.C. section 360bbb-3(b)(1), unless the authorization is terminated or revoked sooner. Performed at [facility], [place] [address] [place], [place] [address]   Surgical PCR screen     Status: None   Collection Time: 01/16/19  5:00 AM   Specimen: Nasal Mucosa; Nasal Swab  Result Value Ref Range Status   MRSA, PCR NEGATIVE NEGATIVE Final   Staphylococcus aureus NEGATIVE NEGATIVE Final    Comment: (NOTE) The Xpert SA Assay (FDA approved for NASAL specimens in patients 41 years of age and older), is one component of a comprehensive surveillance program. It is not intended to diagnose infection nor to guide or monitor treatment. Performed at [facility], [address] [place], [place] [address]      Radiology Studies: Dg Chest 2 View  Result Date: 01/15/2019 CLINICAL DATA:  Multiple trauma secondary to a fall last night. Bruising to the right side of the chest and right shoulder. EXAM: CHEST - 2 VIEW COMPARISON:  None. FINDINGS:  There are fractures of the anterolateral aspects of the right second and third ribs. No other acute bone abnormality. No pneumothorax or lung contusion or pleural effusion. Heart size and pulmonary vascularity are normal. Aortic atherosclerosis. No infiltrates. Chronic degenerative changes of the right shoulder consistent with chronic rotator cuff tear. IMPRESSION: Fractures of the anterolateral aspects of the right second and third ribs. No pneumothorax or pleural effusion. Aortic Atherosclerosis (ICD10-I70.0). Electronically Signed   By: Lorriane Shire M.D.   On: 01/15/2019 11:26   Dg Shoulder Right  Result Date: 01/15/2019 CLINICAL DATA:  Right shoulder pain and bruising secondary to a fall last night. EXAM: RIGHT SHOULDER - 2+ VIEW COMPARISON:  None. FINDINGS: There are slightly displaced fractures of the anterolateral aspects of the right second and third ribs. There is no fracture or dislocation of the right shoulder. Narrowing of the subacromial space consistent with chronic rotator cuff tear. Osteopenia. IMPRESSION: 1. Slightly displaced fractures of the anterolateral aspects of the right second and third ribs. 2. Chronic rotator cuff tear. 3. No acute abnormality of the right shoulder. Electronically Signed   By: Lorriane Shire M.D.   On: 01/15/2019 11:28   Dg Elbow 2 Views Right  Result Date: 01/15/2019 CLINICAL DATA:  Right elbow pain secondary to a fall last night. EXAM: RIGHT ELBOW - 2 VIEW COMPARISON:  None. FINDINGS: There is a distracted fracture of the olecranon process of the proximal ulna. Hemarthrosis. Distal humerus and proximal radius are intact. IMPRESSION: 1. Acute distracted fracture of the olecranon process of the proximal ulna. 2. Hemarthrosis. Electronically Signed   By: Lorriane Shire M.D.   On: 01/15/2019 11:27   Ct Head Wo Contrast  Result Date: 01/15/2019 CLINICAL DATA:  Multiple falls, head trauma EXAM: CT HEAD WITHOUT CONTRAST TECHNIQUE: Contiguous axial images were  obtained from the base of the skull through the vertex without intravenous contrast. COMPARISON:  None. FINDINGS: Brain: No evidence of acute infarction, hemorrhage, hydrocephalus, extra-axial collection or mass lesion/mass effect. Scattered low-density changes within the periventricular and subcortical white matter compatible with chronic microvascular ischemic change. Mild diffuse cerebral volume loss. Vascular: Mild atherosclerotic calcifications involving the large vessels of the skull base. No unexpected hyperdense vessel. Skull: Normal. Negative for fracture or focal lesion. Sinuses/Orbits: No acute finding. Other: None. IMPRESSION: 1.  No acute intracranial findings. 2.  Chronic microvascular ischemic change and cerebral volume loss. Electronically Signed   By: Davina Poke M.D.   On: 01/15/2019 10:53   Ct Cervical Spine Wo Contrast  Result Date: 01/15/2019 CLINICAL DATA:  Neck pain. Multiple falls EXAM: CT CERVICAL SPINE WITHOUT CONTRAST TECHNIQUE: Multidetector CT imaging of the cervical spine was performed without intravenous contrast. Multiplanar CT image reconstructions were also generated. COMPARISON:  None. FINDINGS: Alignment: Straightening of the cervical lordosis. Facet  joints are aligned. No traumatic listhesis. Skull base and vertebrae: No acute fracture. No primary bone lesion or focal pathologic process. Soft tissues and spinal canal: No prevertebral fluid or swelling. No visible canal hematoma. Disc levels: Marked degenerative changes at the atlantoaxial articulation with prominent subchondral cysts versus erosions and large soft tissue pannus posterior to the dens resulting in posterior displacement and mass effect on the thecal sac (series 3, image 14; series 6, image 22). Multilevel intervertebral disc height loss, severe at the C4 through C7 levels. Multilevel facet and uncovertebral arthropathy. There is bony ankylosis of the left facet joints at C2-3 and C3-4. At least moderate  canal stenosis at the C5-6 level. Severe multilevel foraminal stenosis, most pronounced at C3-4 and C4-5 on the left. Upper chest: Trace left pleural effusion. Other: None. IMPRESSION: 1. No acute cervical spine fracture or posttraumatic subluxation. 2. Marked chronic degenerative changes at the atlantoaxial articulation with prominent subchondral cysts versus erosions and large soft tissue pannus posterior to the dens resulting in posterior displacement and mass effect on the thecal sac. 3. Multilevel degenerative disc and facet arthropathy. 4. Severe multilevel foraminal stenosis, most pronounced at C3-4 and C4-5 on the left. 5. Trace left pleural effusion. Electronically Signed   By: Davina Poke M.D.   On: 01/15/2019 11:02   [place] Or Nerve Block-image Only ([facility])  Result Date: 01/16/2019 There is no interpretation for this exam.  This order is for images obtained during a surgical procedure.  Please See "Surgeries" Tab for more information regarding the procedure.    Scheduled Meds:  bupivacaine       [MAR Hold] calcium-vitamin D  1 tablet Oral BID   [MAR Hold] diltiazem  120 mg Oral Daily   [MAR Hold] isosorbide mononitrate  30 mg Oral Daily   [MAR Hold] lidocaine  1 patch Transdermal Q24H   lidocaine (PF)       [MAR Hold] metoprolol tartrate  100 mg Oral BID   [MAR Hold] pravastatin  20 mg Oral q1800   Continuous Infusions:  lactated ringers 50 mL/hr at 01/16/19 0300     LOS: 1 day   Time spent: 45 minutes.  I personally reviewed her chart and previous notes.  Lorella Nimrod, MD [facility] Pager [phone]  If 7PM-7AM, please contact night-coverage [email or web address] Password [facility] 01/16/2019, 1:47 PM   This record has been created using Systems analyst. Errors have been sought and corrected,but may not always be located. Such creation errors do not reflect on the standard of care.

## 2019-01-16 NOTE — Anesthesia Preprocedure Evaluation (Signed)
Anesthesia Evaluation  Patient identified by MRN, date of birth, ID band Patient awake    Reviewed: Allergy & Precautions, H&P , NPO status , Patient's Chart, lab work & pertinent test results  Airway Mallampati: II  TM Distance: >3 FB Neck ROM: full    Dental  (+) Chipped, Missing   Pulmonary shortness of breath (endorses some SOB since admission, wearing 2L/min O2), neg COPD, neg recent URI,           Cardiovascular hypertension, + CAD, + Past MI and +CHF       Neuro/Psych negative neurological ROS  negative psych ROS   GI/Hepatic negative GI ROS, Neg liver ROS,   Endo/Other  negative endocrine ROS  Renal/GU negative Renal ROS  negative genitourinary   Musculoskeletal   Abdominal   Peds  Hematology negative hematology ROS (+)   Anesthesia Other Findings Past Medical History: No date: Breast cancer ([place])     Comment:  remission No date: Hypertension  Past Surgical History: No date: ABDOMINAL HYSTERECTOMY No date: APPENDECTOMY No date: BREAST IMPLANT EXCHANGE No date: CHOLECYSTECTOMY 08/20/2015: ESOPHAGOGASTRODUODENOSCOPY (EGD) WITH PROPOFOL; N/A     Comment:  Procedure: ESOPHAGOGASTRODUODENOSCOPY (EGD) WITH               PROPOFOL;  Surgeon: Lollie Sails, MD;  Location:               [facility] ENDOSCOPY;  Service: Endoscopy;  Laterality: N/A; No date: MASTECTOMY; Bilateral  BMI    Body Mass Index: 19.20 kg/m      Reproductive/Obstetrics negative OB ROS                             Anesthesia Physical Anesthesia Plan  ASA: III  Anesthesia Plan: General   Post-op Pain Management:    Induction:   PONV Risk Score and Plan: Propofol infusion and TIVA  Airway Management Planned:   Additional Equipment:   Intra-op Plan:   Post-operative Plan:   Informed Consent: I have reviewed the patients History and Physical, chart, labs and discussed the procedure including the  risks, benefits and alternatives for the proposed anesthesia with the patient or authorized representative who has indicated his/her understanding and acceptance.     Dental Advisory Given  Plan Discussed with: Anesthesiologist  Anesthesia Plan Comments:         Anesthesia Quick Evaluation

## 2019-01-16 NOTE — Anesthesia Procedure Notes (Signed)
Procedure Name: MAC Performed by: Demetrius Charity, CRNA Pre-anesthesia Checklist: Patient identified, Emergency Drugs available, Suction available, Patient being monitored and Timeout performed Patient Re-evaluated:Patient Re-evaluated prior to induction Oxygen Delivery Method: Simple face mask Induction Type: IV induction

## 2019-01-16 NOTE — TOC Initial Note (Signed)
Transition of Care [facility]) - Initial/Assessment Note    Patient Details  Name: Sheryl Suarez MRN: [id number] Date of Birth: September 06, 1923  Transition of Care [facility] CM/SW Contact:    Shelbie Hutching, RN Phone Number: 01/16/2019, 9:26 AM  Clinical Narrative:                 Patient admitted for right elbow fracture after falling in her home.  Patient reports falling more than once yesterday.  Patient lives alone in a townhome, she walks with a cane.  Patient reports that she has a walker but there really isn't enough room in the town home to use it.  Patient's daughter helps take care of her, [name], the daughter does the grocery shopping and provides all transportation.  Patient is current with PCP and [facility].  Patient is scheduled for surgery today.  Patient reports that if after surgery she needs to go to SNF for a short time she is okay with that.   Patient gives permission to speak with daughter [name] if needed.   Expected Discharge Plan: Skilled Nursing Facility Barriers to Discharge: Continued Medical Work up   Patient Goals and CMS Choice Patient states their goals for this hospitalization and ongoing recovery are:: like to be able to take care of myself again, I do not want to depend on anyone      Expected Discharge Plan and Services Expected Discharge Plan: [place]   Discharge Planning Services: CM Consult   Living arrangements for the past 2 months: Single Family Home(townhome)                                      Prior Living Arrangements/Services Living arrangements for the past 2 months: Single Family Home(townhome) Lives with:: Self Patient language and need for interpreter reviewed:: Yes Do you feel safe going back to the place where you live?: Yes      Need for Family Participation in Patient Care: Yes (Comment)(frequent falls) Care giver support system in place?: Yes (comment)(daughter [place]) Current home services:  DME(cane and walker) Criminal Activity/Legal Involvement Pertinent to Current Situation/Hospitalization: No - Comment as needed  Activities of Daily Living Home Assistive Devices/Equipment: Dentures (specify type), Eyeglasses, Walker (specify type), Cane (specify quad or straight) ADL Screening (condition at time of admission) Patient's cognitive ability adequate to safely complete daily activities?: Yes Is the patient deaf or have difficulty hearing?: Yes Does the patient have difficulty seeing, even when wearing glasses/contacts?: No Does the patient have difficulty concentrating, remembering, or making decisions?: No Patient able to express need for assistance with ADLs?: Yes Does the patient have difficulty dressing or bathing?: No Independently performs ADLs?: Yes (appropriate for developmental age) Does the patient have difficulty walking or climbing stairs?: Yes Weakness of Legs: Both Weakness of Arms/Hands: Both  Permission Sought/Granted Permission sought to share information with : Case Manager, Family Supports Permission granted to share information with : Yes, Verbal Permission Granted        Permission granted to share info w Relationship: daughter [name]     Emotional Assessment Appearance:: Appears stated age Attitude/Demeanor/Rapport: Engaged Affect (typically observed): Accepting Orientation: : Oriented to Self, Oriented to Place, Oriented to  Time, Oriented to Situation Alcohol / Substance Use: Not Applicable Psych Involvement: No (comment)  Admission diagnosis:  Generalized weakness [R53.1] Fall, initial encounter [W19.XXXA] Closed fracture of multiple ribs of right  side, initial encounter [S22.41XA] Closed fracture of olecranon process of right ulna, initial encounter [S52.021A] Patient Active Problem List   Diagnosis Date Noted  . Closed fracture dislocation of right elbow 01/15/2019  . Closed rib fracture 01/15/2019  . HTN (hypertension), benign  01/15/2019  . Chronic systolic CHF (congestive heart failure) ([place]) 01/15/2019  . CAD (coronary artery disease) 01/15/2019  . Atrial fibrillation, chronic ([place]) 01/15/2019  . Rhabdomyolysis 01/15/2019  . GI bleed 08/18/2015   PCP:  Leonel Ramsay, MD Pharmacy:   [id number] [place], [place] [address] [place] [address] Phone: [phone] Fax: [phone]     Social Determinants of Health (SDOH) Interventions    Readmission Risk Interventions No flowsheet data found.

## 2019-01-16 NOTE — H&P (Signed)
THE PATIENT WAS SEEN PRIOR TO SURGERY TODAY.  HISTORY, ALLERGIES, HOME MEDICATIONS AND OPERATIVE PROCEDURE WERE REVIEWED. RISKS AND BENEFITS OF SURGERY DISCUSSED WITH PATIENT AGAIN.  NO CHANGES FROM INITIAL HISTORY AND PHYSICAL NOTED.    

## 2019-01-16 NOTE — Anesthesia Post-op Follow-up Note (Signed)
Anesthesia QCDR form completed.        

## 2019-01-17 ENCOUNTER — Encounter: Payer: Self-pay | Admitting: Specialist

## 2019-01-17 LAB — COMPREHENSIVE METABOLIC PANEL
ALT: 28 U/L (ref 0–44)
AST: 41 U/L (ref 15–41)
Albumin: 3.3 g/dL — ABNORMAL LOW (ref 3.5–5.0)
Alkaline Phosphatase: 101 U/L (ref 38–126)
Anion gap: 11 (ref 5–15)
BUN: 40 mg/dL — ABNORMAL HIGH (ref 8–23)
CO2: 21 mmol/L — ABNORMAL LOW (ref 22–32)
Calcium: 9 mg/dL (ref 8.9–10.3)
Chloride: 106 mmol/L (ref 98–111)
Creatinine, Ser: 1 mg/dL (ref 0.44–1.00)
GFR calc Af Amer: 56 mL/min — ABNORMAL LOW (ref >60)
GFR calc non Af Amer: 49 mL/min — ABNORMAL LOW (ref >60)
Glucose, Bld: 74 mg/dL (ref 70–99)
Potassium: 4.1 mmol/L (ref 3.5–5.1)
Sodium: 138 mmol/L (ref 135–145)
Total Bilirubin: 1.5 mg/dL — ABNORMAL HIGH (ref 0.3–1.2)
Total Protein: 6.2 g/dL — ABNORMAL LOW (ref 6.5–8.1)

## 2019-01-17 LAB — CBC
HCT: 35 % — ABNORMAL LOW (ref 36.0–46.0)
Hemoglobin: 10.5 g/dL — ABNORMAL LOW (ref 12.0–15.0)
MCH: 31 pg (ref 26.0–34.0)
MCHC: 30 g/dL (ref 30.0–36.0)
MCV: 103.2 fL — ABNORMAL HIGH (ref 80.0–100.0)
Platelets: 114 10*3/uL — ABNORMAL LOW (ref 150–400)
RBC: 3.39 MIL/uL — ABNORMAL LOW (ref 3.87–5.11)
RDW: 14.8 % (ref 11.5–15.5)
WBC: 7.9 10*3/uL (ref 4.0–10.5)
nRBC: 0 % (ref 0.0–0.2)

## 2019-01-17 LAB — CK: Total CK: 358 U/L — ABNORMAL HIGH (ref 38–234)

## 2019-01-17 LAB — VITAMIN B12: Vitamin B-12: 618 pg/mL (ref 180–914)

## 2019-01-17 NOTE — Progress Notes (Signed)
PROGRESS NOTE    Sheryl Suarez  [id number] DOB: 30-May-1923 DOA: 01/15/2019 PCP: Leonel Ramsay, MD   Brief Narrative:  Sheryl Suarez is a 86 y.o. female with medical history significant of HTN, CAD, s/p CABG, sCHF (EF 30%), A-fib not on [facility], h/o breast cancer, presented to ED after falls. Pt lives alone and recently had multiple falls. She fell at home yesterday afternoon and was assisted up.  She fell again at night round 8pm, not able to get up. She lied on the floor until this morning her daughter visited her.  She had large bruise on her left chest and shoulder.  Images of left elbow shows Acute distracted fracture of the olecranon process of the proximal ulna and some hemarthrosis.  CT head and C-spine was without any acute abnormality.  Chest x-ray and right shoulder x-ray shows mildly displaced fractures of anterolateral aspects of right second and third ribs. Patient was taken to the OR  by orthopedic for ORIF of right elbow.  Subjective: Patient is feeling better when seen this morning.  Sitting comfortably in chair.  Daughter was in the room.  We discussed regarding disposition to SNF for rehab before going home as patient lives alone.  Patient and daughter both are agreeable.  Assessment & Plan:   Principal Problem:   Closed fracture dislocation of right elbow Active Problems:   Closed rib fracture   HTN (hypertension), benign   Chronic systolic CHF (congestive heart failure) (HCC)   CAD (coronary artery disease)   Atrial fibrillation, chronic (HCC)   Rhabdomyolysis   Closed fracture of right olecranon process   Fall   Generalized weakness  Acute distracted fracture of the olecranon process of the proximal ulna. Patient was taken to the OR for ORIF of right elbow by orthopedic. Postop day 1. -PT/OT are recommending SNF placement for rehab. -Social worker consult for placement. -Pain management.  2nd and 3rd rib fracture. -Continue with incentive spirometry and  pain management.  Rhabdomyolysis.  Most likely after remained on floor for long time. CK improving. -Continue monitoring. -Encourage increase p.o. intake and IV fluids with caution as patient has systolic heart failure.  Microcytic anemia.  Checking vitamin B12 and folate levels.  Elevated troponin.  Most likely demand.  Denies any chest pain or shortness of breath.  HTN, CAD, s/p CABG, sCHF (EF 30%), A-fib not on [facility].  Cardiology evaluated her for surgical clearance.  Advising continuation of home diltiazem, metoprolol, isosorbide.  Blood pressure mildly elevated today. -Hold Bumex due to rhabdomyolysis. -Monitor volume status closely. -Not a candidate for anticoagulation due to frequent falls.  Objective: Vitals:   01/17/19 0100 01/17/19 0401 01/17/19 0738 01/17/19 1157  BP: (!) 152/95 (!) 144/71 (!) 112/52 132/75  Pulse: 99 95 85 85  Resp:  17 17 16   Temp: 99.1 F (37.3 C) 99 F (37.2 C) 98.7 F (37.1 C) 97.9 F (36.6 C)  TempSrc: Oral Oral Oral Oral  SpO2: 94% 96% 92% 98%  Weight:      Height:        Intake/Output Summary (Last 24 hours) at 01/17/2019 1305 Last data filed at 01/17/2019 1000 Gross per 24 hour  Intake 1623 ml  Output 105 ml  Net 1518 ml   Filed Weights   01/16/19 1054  Weight: 47.6 kg    Examination:  General exam: Appears calm and comfortable  Respiratory system: Clear to auscultation. Respiratory effort normal. Cardiovascular system: Irregularly irregular, no JVD, murmurs, rubs, gallops or  clicks. No pedal edema. Gastrointestinal system: Abdomen is nondistended, soft and nontender. No organomegaly or masses felt. Normal bowel sounds heard. Central nervous system: Alert and oriented. No focal neurological deficits. Extremities: Right arm in sling, edematous hand, pulses intact. Skin: Multiple ecchymoses involving right shoulder and chest. Psychiatry: Judgement and insight appear normal. Mood & affect appropriate.   DVT prophylaxis: SCDs Code  Status: DNR Family Communication: Daughter was updated at bedside Disposition Plan: Pending SNF bed availability.  Consultants:   Orthopedic  Cardiology  Procedures:  ORIF of right elbow.  Antimicrobials:  Cefazolin and clindamycin for surgery.  Data Reviewed: I have personally reviewed following labs and imaging studies  CBC: Recent Labs  Lab 01/15/19 1006 01/16/19 0350 01/17/19 0353  WBC 9.0 6.1 7.9  NEUTROABS 7.3  --   --   HGB 11.5* 9.7* 10.5*  HCT 37.4 31.3* 35.0*  MCV 100.0 99.1 103.2*  PLT 108* 109* [id number]*   Basic Metabolic Panel: Recent Labs  Lab 01/15/19 1149 01/16/19 0350 01/17/19 0353  NA 141 141 138  K 4.2 3.9 4.1  CL 105 108 106  CO2 24 25 21*  GLUCOSE 99 86 74  BUN 42* 44* 40*  CREATININE 0.98 0.94 1.00  CALCIUM 9.7 9.3 9.0   GFR: Estimated Creatinine Clearance: 26.4 mL/min (by C-G formula based on SCr of 1 mg/dL). Liver Function Tests: Recent Labs  Lab 01/15/19 1149 01/17/19 0353  AST 65* 41  ALT 34 28  ALKPHOS 119 101  BILITOT 1.8* 1.5*  PROT 7.2 6.2*  ALBUMIN 4.0 3.3*   No results for input(s): LIPASE, AMYLASE in the last 168 hours. No results for input(s): AMMONIA in the last 168 hours. Coagulation Profile: Recent Labs  Lab 01/15/19 1854 01/16/19 0350  INR 1.3* 1.3*   Cardiac Enzymes: Recent Labs  Lab 01/15/19 1149 01/16/19 0350 01/17/19 0353  CKTOTAL 1,926* 790* 358*   BNP (last 3 results) No results for input(s): PROBNP in the last 8760 hours. HbA1C: No results for input(s): HGBA1C in the last 72 hours. CBG: No results for input(s): GLUCAP in the last 168 hours. Lipid Profile: No results for input(s): CHOL, HDL, LDLCALC, TRIG, CHOLHDL, LDLDIRECT in the last 72 hours. Thyroid Function Tests: No results for input(s): TSH, T4TOTAL, FREET4, T3FREE, THYROIDAB in the last 72 hours. Anemia Panel: No results for input(s): VITAMINB12, FOLATE, FERRITIN, TIBC, IRON, RETICCTPCT in the last 72 hours. Sepsis Labs: No  results for input(s): PROCALCITON, LATICACIDVEN in the last 168 hours.  Recent Results (from the past 240 hour(s))  SARS CORONAVIRUS 2 (TAT 6-24 HRS) Nasopharyngeal Nasopharyngeal Swab     Status: None   Collection Time: 01/15/19  1:37 PM   Specimen: Nasopharyngeal Swab  Result Value Ref Range Status   SARS Coronavirus 2 NEGATIVE NEGATIVE Final    Comment: (NOTE) SARS-CoV-2 target nucleic acids are NOT DETECTED. The SARS-CoV-2 RNA is generally detectable in upper and lower respiratory specimens during the acute phase of infection. Negative results do not preclude SARS-CoV-2 infection, do not rule out co-infections with other pathogens, and should not be used as the sole basis for treatment or other patient management decisions. Negative results must be combined with clinical observations, patient history, and epidemiological information. The expected result is Negative. Fact Sheet for Patients: SugarRoll.be Fact Sheet for Healthcare Providers: https://www.woods-mathews.com/ This test is not yet approved or cleared by the [place] FDA and  has been authorized for detection and/or diagnosis of SARS-CoV-2 by FDA under an Emergency Use Authorization (EUA). This  EUA will remain  in effect (meaning this test can be used) for the duration of the COVID-19 declaration under Section 56 4(b)(1) of the Act, 21 U.S.C. section 360bbb-3(b)(1), unless the authorization is terminated or revoked sooner. Performed at [facility], [place] [address], [place], [place] [address]   Surgical PCR screen     Status: None   Collection Time: 01/16/19  5:00 AM   Specimen: Nasal Mucosa; Nasal Swab  Result Value Ref Range Status   MRSA, PCR NEGATIVE NEGATIVE Final   Staphylococcus aureus NEGATIVE NEGATIVE Final    Comment: (NOTE) The Xpert SA Assay (FDA approved for NASAL specimens in patients 55 years of age and older), is one component of a comprehensive  surveillance program. It is not intended to diagnose infection nor to guide or monitor treatment. Performed at [facility], [address], [place], [place] [address]      Radiology Studies: Dg Elbow 2 Views Right  Result Date: 01/16/2019 CLINICAL DATA:  Olecranon ORIF. EXAM: RIGHT ELBOW - 2 VIEW COMPARISON:  Right elbow x-rays from yesterday. FINDINGS: Interval ORIF of the olecranon fracture with 2 K-wires and a cerclage wire. Alignment is normal. Small amount of expected intra-articular air and posterior subcutaneous emphysema. Osteopenia. IMPRESSION: Interval olecranon ORIF, now in normal anatomic alignment. Electronically Signed   By: Titus Dubin M.D.   On: 01/16/2019 15:12   [place] Or Nerve Block-image Only ([facility])  Result Date: 01/16/2019 There is no interpretation for this exam.  This order is for images obtained during a surgical procedure.  Please See "Surgeries" Tab for more information regarding the procedure.    Scheduled Meds: . calcium-vitamin D  1 tablet Oral BID  . diltiazem  120 mg Oral Daily  . docusate sodium  100 mg Oral BID  . enoxaparin (LOVENOX) injection  30 mg Subcutaneous Q24H  . isosorbide mononitrate  30 mg Oral Daily  . lidocaine  1 patch Transdermal Q24H  . metoprolol tartrate  100 mg Oral BID  . pravastatin  20 mg Oral q1800   Continuous Infusions: . sodium chloride 75 mL/hr at 01/17/19 1253  . clindamycin (CLEOCIN) IV 600 mg (01/17/19 0527)  . lactated ringers 50 mL/hr at 01/16/19 0300  . methocarbamol (ROBAXIN) IV       LOS: 2 days   Time spent: 35 minutes.  I personally reviewed her chart and previous notes.  Lorella Nimrod, MD [facility] Pager [phone]  If 7PM-7AM, please contact night-coverage [email or web address] Password [facility] 01/17/2019, 1:05 PM   This record has been created using Systems analyst. Errors have been sought and corrected,but may not always be located. Such creation errors do not  reflect on the standard of care.

## 2019-01-17 NOTE — Progress Notes (Signed)
Subjective:  POD #1 s/p right olecranon ORIF.   Patient reports right elbow pain as mild to moderate.  Patient is seen with physical therapy in the room.  She denies any acute issues.  Objective:   VITALS:   Vitals:   01/16/19 1946 01/17/19 0100 01/17/19 0401 01/17/19 0738  BP: (!) 154/91 (!) 152/95 (!) 144/71 (!) 112/52  Pulse: 99 99 95 85  Resp: 17  17 17   Temp: 98.1 F (36.7 C) 99.1 F (37.3 C) 99 F (37.2 C) 98.7 F (37.1 C)  TempSrc: Oral Oral Oral Oral  SpO2: 94% 94% 96% 92%  Weight:      Height:        PHYSICAL EXAM: Right upper extremity: Patient has resolving extensive ecchymosis extending from her right shoulder into the right upper arm.  Her right upper extremity compartments are soft and compressible.  Patient can flex and extend all 5 fingers on the right hand.  Her fingers are well-perfused.  She has intact sensation light touch throughout the right upper extremity.  Patient's dressing and splint are clean dry and intact.  A right arm sling is in place.   LABS  Results for orders placed or performed during the hospital encounter of 01/15/19 (from the past 24 hour(s))  CBC     Status: Abnormal   Collection Time: 01/17/19  3:53 AM  Result Value Ref Range   WBC 7.9 4.0 - 10.5 K/uL   RBC 3.39 (L) 3.87 - 5.11 MIL/uL   Hemoglobin 10.5 (L) 12.0 - 15.0 g/dL   HCT 35.0 (L) 36.0 - 46.0 %   MCV 103.2 (H) 80.0 - 100.0 fL   MCH 31.0 26.0 - 34.0 pg   MCHC 30.0 30.0 - 36.0 g/dL   RDW 14.8 11.5 - 15.5 %   Platelets 114 (L) 150 - 400 K/uL   nRBC 0.0 0.0 - 0.2 %  Comprehensive metabolic panel     Status: Abnormal   Collection Time: 01/17/19  3:53 AM  Result Value Ref Range   Sodium 138 135 - 145 mmol/L   Potassium 4.1 3.5 - 5.1 mmol/L   Chloride 106 98 - 111 mmol/L   CO2 21 (L) 22 - 32 mmol/L   Glucose, Bld 74 70 - 99 mg/dL   BUN 40 (H) 8 - 23 mg/dL   Creatinine, Ser 1.00 0.44 - 1.00 mg/dL   Calcium 9.0 8.9 - 10.3 mg/dL   Total Protein 6.2 (L) 6.5 - 8.1 g/dL    Albumin 3.3 (L) 3.5 - 5.0 g/dL   AST 41 15 - 41 U/L   ALT 28 0 - 44 U/L   Alkaline Phosphatase 101 38 - 126 U/L   Total Bilirubin 1.5 (H) 0.3 - 1.2 mg/dL   GFR calc non Af Amer 49 (L) >60 mL/min   GFR calc Af Amer 56 (L) >60 mL/min   Anion gap 11 5 - 15  CK     Status: Abnormal   Collection Time: 01/17/19  3:53 AM  Result Value Ref Range   Total CK 358 (H) 38 - 234 U/L    Dg Chest 2 View  Result Date: 01/15/2019 CLINICAL DATA:  Multiple trauma secondary to a fall last night. Bruising to the right side of the chest and right shoulder. EXAM: CHEST - 2 VIEW COMPARISON:  None. FINDINGS: There are fractures of the anterolateral aspects of the right second and third ribs. No other acute bone abnormality. No pneumothorax or lung contusion or pleural effusion.  Heart size and pulmonary vascularity are normal. Aortic atherosclerosis. No infiltrates. Chronic degenerative changes of the right shoulder consistent with chronic rotator cuff tear. IMPRESSION: Fractures of the anterolateral aspects of the right second and third ribs. No pneumothorax or pleural effusion. Aortic Atherosclerosis (ICD10-I70.0). Electronically Signed   By: Lorriane Shire M.D.   On: 01/15/2019 11:26   Dg Shoulder Right  Result Date: 01/15/2019 CLINICAL DATA:  Right shoulder pain and bruising secondary to a fall last night. EXAM: RIGHT SHOULDER - 2+ VIEW COMPARISON:  None. FINDINGS: There are slightly displaced fractures of the anterolateral aspects of the right second and third ribs. There is no fracture or dislocation of the right shoulder. Narrowing of the subacromial space consistent with chronic rotator cuff tear. Osteopenia. IMPRESSION: 1. Slightly displaced fractures of the anterolateral aspects of the right second and third ribs. 2. Chronic rotator cuff tear. 3. No acute abnormality of the right shoulder. Electronically Signed   By: Lorriane Shire M.D.   On: 01/15/2019 11:28   Dg Elbow 2 Views Right  Result Date:  01/16/2019 CLINICAL DATA:  Olecranon ORIF. EXAM: RIGHT ELBOW - 2 VIEW COMPARISON:  Right elbow x-rays from yesterday. FINDINGS: Interval ORIF of the olecranon fracture with 2 K-wires and a cerclage wire. Alignment is normal. Small amount of expected intra-articular air and posterior subcutaneous emphysema. Osteopenia. IMPRESSION: Interval olecranon ORIF, now in normal anatomic alignment. Electronically Signed   By: Titus Dubin M.D.   On: 01/16/2019 15:12   Dg Elbow 2 Views Right  Result Date: 01/15/2019 CLINICAL DATA:  Right elbow pain secondary to a fall last night. EXAM: RIGHT ELBOW - 2 VIEW COMPARISON:  None. FINDINGS: There is a distracted fracture of the olecranon process of the proximal ulna. Hemarthrosis. Distal humerus and proximal radius are intact. IMPRESSION: 1. Acute distracted fracture of the olecranon process of the proximal ulna. 2. Hemarthrosis. Electronically Signed   By: Lorriane Shire M.D.   On: 01/15/2019 11:27   Ct Head Wo Contrast  Result Date: 01/15/2019 CLINICAL DATA:  Multiple falls, head trauma EXAM: CT HEAD WITHOUT CONTRAST TECHNIQUE: Contiguous axial images were obtained from the base of the skull through the vertex without intravenous contrast. COMPARISON:  None. FINDINGS: Brain: No evidence of acute infarction, hemorrhage, hydrocephalus, extra-axial collection or mass lesion/mass effect. Scattered low-density changes within the periventricular and subcortical white matter compatible with chronic microvascular ischemic change. Mild diffuse cerebral volume loss. Vascular: Mild atherosclerotic calcifications involving the large vessels of the skull base. No unexpected hyperdense vessel. Skull: Normal. Negative for fracture or focal lesion. Sinuses/Orbits: No acute finding. Other: None. IMPRESSION: 1.  No acute intracranial findings. 2.  Chronic microvascular ischemic change and cerebral volume loss. Electronically Signed   By: Davina Poke M.D.   On: 01/15/2019 10:53    Ct Cervical Spine Wo Contrast  Result Date: 01/15/2019 CLINICAL DATA:  Neck pain. Multiple falls EXAM: CT CERVICAL SPINE WITHOUT CONTRAST TECHNIQUE: Multidetector CT imaging of the cervical spine was performed without intravenous contrast. Multiplanar CT image reconstructions were also generated. COMPARISON:  None. FINDINGS: Alignment: Straightening of the cervical lordosis. Facet joints are aligned. No traumatic listhesis. Skull base and vertebrae: No acute fracture. No primary bone lesion or focal pathologic process. Soft tissues and spinal canal: No prevertebral fluid or swelling. No visible canal hematoma. Disc levels: Marked degenerative changes at the atlantoaxial articulation with prominent subchondral cysts versus erosions and large soft tissue pannus posterior to the dens resulting in posterior displacement and mass effect on  the thecal sac (series 3, image 14; series 6, image 22). Multilevel intervertebral disc height loss, severe at the C4 through C7 levels. Multilevel facet and uncovertebral arthropathy. There is bony ankylosis of the left facet joints at C2-3 and C3-4. At least moderate canal stenosis at the C5-6 level. Severe multilevel foraminal stenosis, most pronounced at C3-4 and C4-5 on the left. Upper chest: Trace left pleural effusion. Other: None. IMPRESSION: 1. No acute cervical spine fracture or posttraumatic subluxation. 2. Marked chronic degenerative changes at the atlantoaxial articulation with prominent subchondral cysts versus erosions and large soft tissue pannus posterior to the dens resulting in posterior displacement and mass effect on the thecal sac. 3. Multilevel degenerative disc and facet arthropathy. 4. Severe multilevel foraminal stenosis, most pronounced at C3-4 and C4-5 on the left. 5. Trace left pleural effusion. Electronically Signed   By: Davina Poke M.D.   On: 01/15/2019 11:02   [place] Or Nerve Block-image Only ([facility])  Result Date: 01/16/2019 There is no  interpretation for this exam.  This order is for images obtained during a surgical procedure.  Please See "Surgeries" Tab for more information regarding the procedure.    Assessment/Plan: 1 Day Post-Op   Principal Problem:   Closed fracture dislocation of right elbow Active Problems:   Closed rib fracture   HTN (hypertension), benign   Chronic systolic CHF (congestive heart failure) (HCC)   CAD (coronary artery disease)   Atrial fibrillation, chronic (HCC)   Rhabdomyolysis   Closed fracture of right olecranon process   Fall   Generalized weakness  Patient is stable postop.  Continue with physical therapy.  Patient may get out of bed.  Bedrest orders discontinued.  Patient is being trialed with a hemiwalker with physical therapy.  She is weightbearing as tolerated on both lower extremities but is nonweightbearing on the right upper extremity for now.  I will discuss weightbearing status with Dr. Sabra Heck.    Thornton Park , MD 01/17/2019, 9:46 AM

## 2019-01-17 NOTE — Progress Notes (Signed)
Patient has had uneventful day. Has been up in recliner chair for several hours. Transferred to [facility] for toileting and tolerated well. Sling continues to be in use to right arm. No distress noted.

## 2019-01-17 NOTE — Evaluation (Signed)
Physical Therapy Evaluation Patient Details Name: Sheryl Suarez MRN: [id number] DOB: 03-09-1923 Today's Date: 01/17/2019   History of Present Illness  Sheryl Suarez is a 85 y.o. female with medical history significant of HTN, CAD, s/p CABG, sCHF (EF 30%), A-fib not on [facility], h/o breast cancer, presented to ED on 01/15/2019 after multiple falls. She laid on the floor overnight before being found by her daughter. Radiograph showed acute distracted fracture of the olecranon process of proximal ulna. She was admitted to the hospital for closed fracture dislocation of R elbow and closed 2nd and third rib fracture, rhabdomyolysis, and elevated troponin likely related to rhabdo. She underwent ORIF to the R elbow 01/16/2019. NWB R UE per Dr. Donney Rankins    Clinical Impression  Patient alert and able to provide history. Was previously independent with cane living alone in single story home without steps to enter. Reports 2 falls in the past 6 months. Daughter did errands outside of home as patient let drivers' licence expire in May. Upon physical therapy evaluation, patient required min to mod A for bed mobility and transfers. She was very unsteady on feet and demonstrated significant deficits in safety awareness. She was only able to take a few steps during transfer and had difficulty with motor planning and following cuing for safe gait sequencing and use of AD. She appears to have experienced a significant decline in functional mobility/independence and would benefit from short term rehabilitation prior to return home. Dr. Mack Guise came into the room to check her arm during PT session and clarified that she should be NWB on R UE, WBAT on B LE and use hemi-walker instead of platform walker if able. Patient would benefit from physical therapy to address impairments and functional limitations (see PT Problem List below) to work towards stated goals and return to PLOF or maximal functional independence.      Follow  Up Recommendations SNF;Supervision for mobility/OOB    Equipment Recommendations  Other (comment);3in1 (PT)(hemi-cane)    Recommendations for Other Services OT consult     Precautions / Restrictions Precautions Precautions: Fall Restrictions Weight Bearing Restrictions: Yes RUE Weight Bearing: Non weight bearing(clarified by Dr. Donney Rankins who came into the room during session) Other Position/Activity Restrictions: B LE WBAT      Mobility  Bed Mobility Overal bed mobility: Needs Assistance Bed Mobility: Supine to Sit     Supine to sit: Mod assist;HOB elevated     General bed mobility comments: patient required mod A +1 to roll and come to sitting as well as scoot to edge of bed. Required strong support at trunk and assistance to advance legs during scooting. Unstable trunk in sitting initially.  Transfers Overall transfer level: Needs assistance   Transfers: Sit to/from Stand Sit to Stand: Mod assist         General transfer comment: Patient completed sit <> stand from edge of bed to chair using hemi walker. Did not lean forward adequately to stand, required cuing to push off with L UE, continued to lean back bracing legs on bed for several seconds and against therapist support without realizing she was off balance, continued to sway and lean backwards for several minutes while attempting to initiate appropriate walking. kept bending forward to reach for chair handle that was out of her reach. Took very small steps and was unable to spacially reason to make room for Hemiwalker at her side. Continued to move hemiwalker out of the way in unsafe manner and did not appear to  realize this was unsafe. pivoted with support on L UE to sit prior to positioning properly in front of chair. Required heavy cuing and had difficulty following instructions.  Ambulation/Gait Ambulation/Gait assistance: Mod assist Gait Distance (Feet): 1 Feet Assistive device: Hemi-walker   Gait velocity:  extremely slow   General Gait Details: Patient took a couple of steps while transffering bed to chair. Very unsafe and unstable, reaching for chair, bending forward and improper use of AD despite heavy cuing and restricting her unsafe attempts in order to try again.  Stairs            Wheelchair Mobility    Modified Rankin (Stroke Patients Only)       Balance Overall balance assessment: Needs assistance Sitting-balance support: Bilateral upper extremity supported Sitting balance-Leahy Scale: Fair Sitting balance - Comments: initially unable to maintain trunk control without LE support, improved to moderately steady sitting on edge of bed but could not tolerate challenge.   Standing balance support: Single extremity supported Standing balance-Leahy Scale: Poor Standing balance comment: Patient unable to maintain balance in standing without assistance despite use of AD.                             Pertinent Vitals/Pain Pain Assessment: No/denies pain    Home Living                        Prior Function                 Hand Dominance        Extremity/Trunk Assessment   Upper Extremity Assessment Upper Extremity Assessment: RUE deficits/detail;LUE deficits/detail RUE Deficits / Details: NWB, able to move fingers. Extensive echymosis noted at shoulder. RUE: Unable to fully assess due to immobilization LUE Deficits / Details: L UE generalized weakness.    Lower Extremity Assessment Lower Extremity Assessment: Generalized weakness    Cervical / Trunk Assessment Cervical / Trunk Assessment: Normal  Communication      Cognition Arousal/Alertness: Awake/alert Behavior During Therapy: WFL for tasks assessed/performed Overall Cognitive Status: No family/caregiver present to determine baseline cognitive functioning                                 General Comments: Patient had difficulty following directions for use of AD and  to gain balance. Oriented to self and situation, thought it was Wednesday in [place]. Oriented her that it is thanksgiving.      General Comments      Exercises Other Exercises Other Exercises: educated in purpose and use of hemi-walker. educated on weight bearing precautions, safe transfer and gait techniques. Practiced standing at edge of bed for several minutes.   Assessment/Plan    PT Assessment Patient needs continued PT services  PT Problem List Decreased strength;Decreased mobility;Decreased safety awareness;Decreased knowledge of precautions;Decreased activity tolerance;Decreased cognition;Decreased skin integrity;Pain;Decreased balance;Decreased knowledge of use of DME       PT Treatment Interventions DME instruction;Therapeutic activities;Cognitive remediation;Gait training;Therapeutic exercise;Patient/family education;Balance training;Functional mobility training;Neuromuscular re-education    PT Goals (Current goals can be found in the Care Plan section)  Acute Rehab PT Goals Patient Stated Goal: return home PT Goal Formulation: With patient Time For Goal Achievement: 01/31/19 Potential to Achieve Goals: Fair    Frequency BID   Barriers to discharge Decreased caregiver support patient lives alone and has experienced significant decrease  in functional mobility. lacks safety awareness.    Co-evaluation               AM-PAC PT "6 Clicks" Mobility  Outcome Measure Help needed turning from your back to your side while in a flat bed without using bedrails?: A Little Help needed moving from lying on your back to sitting on the side of a flat bed without using bedrails?: A Lot Help needed moving to and from a bed to a chair (including a wheelchair)?: A Lot Help needed standing up from a chair using your arms (e.g., wheelchair or bedside chair)?: A Little Help needed to walk in hospital room?: Total Help needed climbing 3-5 steps with a railing? : Total 6 Click  Score: 12    End of Session Equipment Utilized During Treatment: Gait belt;Other (comment)(hemi-walker) Activity Tolerance: Patient tolerated treatment well;Patient limited by fatigue Patient left: in chair;with call bell/phone within reach;with chair alarm set;with SCD's reapplied Nurse Communication: Mobility status;Weight bearing status;Precautions PT Visit Diagnosis: Unsteadiness on feet (R26.81);Other abnormalities of gait and mobility (R26.89);Repeated falls (R29.6);Muscle weakness (generalized) (M62.81)    Time: [id number] PT Time Calculation (min) (ACUTE ONLY): 25 min   Charges:   PT Evaluation $PT Eval Moderate Complexity: 1 Mod PT Treatments $Therapeutic Activity: 8-22 mins        Everlean Alstrom. Graylon Good, PT, DPT 01/17/19, 10:11 AM

## 2019-01-18 LAB — BASIC METABOLIC PANEL
Anion gap: 8 (ref 5–15)
BUN: 40 mg/dL — ABNORMAL HIGH (ref 8–23)
CO2: 24 mmol/L (ref 22–32)
Calcium: 9.1 mg/dL (ref 8.9–10.3)
Chloride: 104 mmol/L (ref 98–111)
Creatinine, Ser: 0.89 mg/dL (ref 0.44–1.00)
GFR calc Af Amer: >60 mL/min (ref >60)
GFR calc non Af Amer: 56 mL/min — ABNORMAL LOW (ref >60)
Glucose, Bld: 103 mg/dL — ABNORMAL HIGH (ref 70–99)
Potassium: 4.1 mmol/L (ref 3.5–5.1)
Sodium: 136 mmol/L (ref 135–145)

## 2019-01-18 MED ORDER — INFLUENZA VAC A&B SA ADJ QUAD 0.5 ML IM PRSY
0.5000 mL | PREFILLED_SYRINGE | INTRAMUSCULAR | Status: AC
  Administered 2019-01-19: 0.5 mL via INTRAMUSCULAR
  Filled 2019-01-18: qty 0.5

## 2019-01-18 MED ORDER — ASPIRIN EC 81 MG PO TBEC
81.0000 mg | DELAYED_RELEASE_TABLET | Freq: Every day | ORAL | Status: DC
  Administered 2019-01-18 – 2019-01-21 (×4): 81 mg via ORAL
  Filled 2019-01-18 (×4): qty 1

## 2019-01-18 MED ORDER — PNEUMOCOCCAL VAC POLYVALENT 25 MCG/0.5ML IJ INJ
0.5000 mL | INJECTION | INTRAMUSCULAR | Status: AC
  Administered 2019-01-21: 0.5 mL via INTRAMUSCULAR
  Filled 2019-01-18: qty 0.5

## 2019-01-18 NOTE — Progress Notes (Signed)
Physical Therapy Treatment Patient Details Name: Sheryl Suarez MRN: [id number] DOB: 1923/08/08 Today's Date: 01/18/2019    History of Present Illness Sheryl Suarez is a 85 y.o. female with medical history significant of HTN, CAD, s/p CABG, sCHF (EF 30%), A-fib not on [facility], h/o breast cancer, presented to ED on 01/15/2019 after multiple falls. She laid on the floor overnight before being found by her daughter. Radiograph showed acute distracted fracture of the olecranon process of proximal ulna. She was admitted to the hospital for closed fracture dislocation of R elbow and closed 2nd and third rib fracture, rhabdomyolysis, and elevated troponin likely related to rhabdo. She underwent ORIF to the R elbow 01/16/2019. NWB R UE per Dr. Donney Rankins    PT Comments    Pt still up in recliner upon return to room for 2nd PT session. DTR reports pt has been comfortable, sleeping some while in chair. Pt agreeable to participate, motivated to improve capabilities. Pt's capacity for establishing balance, as well as correcting LOB remain substantially limited at this time. Pt able to progress to static stance using LUE on chair back, but progression to small marching in place results in immediate loss of balance in frontal plane requiring max-totalA from author to avoid fall to floor. This level of balance loss occurs >10x in session. Pt allow to attempt some AMB next to the window, using the window sill for LUE support, but pt still requires intermittent mod-totalA for balance correction intermittently. QC remains in room, but pt struggles with supporting self on fixed objects at this time, hence it's not cogent to attempt QC training at present. Pt elects to remain up in chair at end of session.     Follow Up Recommendations  SNF;Supervision for mobility/OOB     Equipment Recommendations  Other (comment);3in1 (PT)    Recommendations for Other Services OT consult     Precautions / Restrictions  Precautions Precautions: Fall Restrictions Weight Bearing Restrictions: Yes RUE Weight Bearing: Non weight bearing Other Position/Activity Restrictions: B LE WBAT    Mobility  Bed Mobility Overal bed mobility: Needs Assistance Bed Mobility: Supine to Sit     Supine to sit: Max assist     General bed mobility comments: Pt received up in chair upon entry, still up from AM session.  Transfers Overall transfer level: Needs assistance Equipment used: None(chair back) Transfers: Sit to/from Stand Sit to Stand: Mod assist Stand pivot transfers: Max assist(very unsteady, poor capacity for trunk control)       General transfer comment: performed 4x in session, mod assist provided 2/2 weakness, establishment of balance progressively quicker.  Ambulation/Gait Ambulation/Gait assistance: (using window sill for balance) Gait Distance (Feet): 24 Feet Assistive device: (window sill)       General Gait Details: ModA for trunk, very slow in general, LUE on window sill, 37ft FWD, 46ft retroAMB, 45ft Rt side stepping, 42ft Lt side stepping   Stairs             Wheelchair Mobility    Modified Rankin (Stroke Patients Only)       Balance Overall balance assessment: Needs assistance Sitting-balance support: Single extremity supported;Feet supported;Feet unsupported Sitting balance-Leahy Scale: Fair   Postural control: Posterior lean Standing balance support: Single extremity supported Standing balance-Leahy Scale: Zero Standing balance comment: capacity for establishing balance or correcting LOB is extremely limited at this time.             High level balance activites: Backward walking;Side stepping High Level Balance  Comments: marching in place            Cognition Arousal/Alertness: Awake/alert Behavior During Therapy: WFL for tasks assessed/performed Overall Cognitive Status: Within Functional Limits for tasks assessed                                         Exercises General Exercises - Lower Extremity Long Arc Quad: Both;AROM;15 reps;Seated Hip Flexion/Marching: AROM;Seated;10 reps;Both Other Exercises Other Exercises: STS from recliner + static balance with LUE on chairback 1x30sec (min-modA required) Other Exercises: STS from recliner +marching in place 2x60sec, LUE on chair back Other Exercises: FWD/RetroAMB 25ft each with LUE on window sill for balance: Mod-TotalA for balance from author Other Exercises: Rt/Lt side stepping 57ft each with LUE on window sill for balance: Mid-MaxA for balance from author    General Comments        Pertinent Vitals/Pain Pain Assessment: No/denies pain Pain Score: 2  Pain Location: R arm Pain Descriptors / Indicators: Aching;Sore Pain Intervention(s): Limited activity within patient's tolerance;Monitored during session;Premedicated before session    [place] expects to be discharged to:: Private residence Living Arrangements: Alone Available Help at Discharge: Family Type of Home: House Home Access: [place]: One [place]: [place] - single point;Walker - 2 wheels;Shower seat - built in Additional Comments: not really using RW; noncompliant with medical alert; DTR estimates falls in last 6 months: 20x    Prior Function Level of Independence: Independent with assistive device(s)      Comments: Patient reports she ambulated with a single point cane, and is independent with dressing, bathing, housework. Her daughter does all errands outside the home for her since she stopped driving in May when her lisence expired.   PT Goals (current goals can now be found in the care plan section) Acute Rehab PT Goals Patient Stated Goal: return home PT Goal Formulation: With patient Time For Goal Achievement: 01/31/19 Potential to Achieve Goals: Fair Progress towards PT goals: Progressing toward goals    Frequency    BID      PT Plan  Current plan remains appropriate    Co-evaluation              AM-PAC PT "6 Clicks" Mobility   Outcome Measure  Help needed turning from your back to your side while in a flat bed without using bedrails?: Total Help needed moving from lying on your back to sitting on the side of a flat bed without using bedrails?: Total Help needed moving to and from a bed to a chair (including a wheelchair)?: Total Help needed standing up from a chair using your arms (e.g., wheelchair or bedside chair)?: Total Help needed to walk in hospital room?: Total Help needed climbing 3-5 steps with a railing? : Total 6 Click Score: 6    End of Session Equipment Utilized During Treatment: Gait belt Activity Tolerance: Patient tolerated treatment well Patient left: in chair;with call bell/phone within reach;with chair alarm set;with SCD's reapplied Nurse Communication: Mobility status;Weight bearing status;Precautions PT Visit Diagnosis: Unsteadiness on feet (R26.81);Other abnormalities of gait and mobility (R26.89);Repeated falls (R29.6);Muscle weakness (generalized) (M62.81)     Time: [id number] PT Time Calculation (min) (ACUTE ONLY): 25 min  Charges:  $Therapeutic Exercise: 8-22 mins $Neuromuscular Re-education: 23-37 mins  4:07 PM, 01/18/19 Etta Grandchild, PT, DPT Physical Therapist - [facility]  [phone] ([place])    Fayetta Sorenson C 01/18/2019, 4:03 PM

## 2019-01-18 NOTE — NC FL2 (Signed)
[place] LEVEL OF CARE SCREENING TOOL     IDENTIFICATION  Patient Name: Sheryl Suarez Birthdate: 11/03/1923 Sex: female Admission Date (Current Location): 01/15/2019  [place] and [place] Number:  Engineering geologist and Address:  [facility], [address], [place], [place] [address]      Provider Number: [id number]  Attending Physician Name and Address:  Lorella Nimrod, MD  Relative Name and Phone Number:  Ernest Pine [phone]    Current Level of Care: Hospital Recommended Level of Care: [place] Prior Approval Number: [id number]  Date Approved/Denied:   PASRR Number: [id number]  Discharge Plan: SNF    Current Diagnoses: Patient Active Problem List   Diagnosis Date Noted  . Closed fracture of right olecranon process   . Fall   . Generalized weakness   . Closed fracture dislocation of right elbow 01/15/2019  . Closed rib fracture 01/15/2019  . HTN (hypertension), benign 01/15/2019  . Chronic systolic CHF (congestive heart failure) ([place]) 01/15/2019  . CAD (coronary artery disease) 01/15/2019  . Atrial fibrillation, chronic ([place]) 01/15/2019  . Rhabdomyolysis 01/15/2019  . GI bleed 08/18/2015    Orientation RESPIRATION BLADDER Height & Weight     Self, Time, Situation, Place  Normal Continent Weight: 47.6 kg Height:  5\' 2"  (157.5 cm)  BEHAVIORAL SYMPTOMS/MOOD NEUROLOGICAL BOWEL NUTRITION STATUS      Continent Diet  AMBULATORY STATUS COMMUNICATION OF NEEDS Skin   Total Care Verbally Surgical wounds, Bruising                       Personal Care Assistance Level of Assistance  Bathing, Dressing Bathing Assistance: Limited assistance Feeding assistance: Independent Dressing Assistance: Limited assistance     Functional Limitations Info             SPECIAL CARE FACTORS FREQUENCY  PT (By licensed PT), OT (By licensed OT)     PT Frequency: 5x/week OT Frequency: 5x/week           Contractures Contractures Info: Not present    Additional Factors Info  Code Status Code Status Info: DNR Allergies Info: No Known Allergies           Current Medications (01/18/2019):  This is the current hospital active medication list Current Facility-Administered Medications  Medication Dose Route Frequency Provider Last Rate Last Dose  . 0.45 % sodium chloride infusion   Intravenous Continuous Earnestine Leys, MD 75 mL/hr at 01/17/19 1253    . aspirin EC tablet 81 mg  81 mg Oral Daily Lorella Nimrod, MD   81 mg at 01/18/19 1527  . bisacodyl (DULCOLAX) suppository 10 mg  10 mg Rectal Daily PRN Earnestine Leys, MD      . calcium-vitamin D (OSCAL WITH D) 500-200 MG-UNIT per tablet 1 tablet  1 tablet Oral BID Earnestine Leys, MD   1 tablet at 01/18/19 [phone]  . diltiazem (CARDIZEM CD) 24 hr capsule 120 mg  120 mg Oral Daily Earnestine Leys, MD   120 mg at 01/17/19 0827  . docusate sodium (COLACE) capsule 100 mg  100 mg Oral BID Earnestine Leys, MD   100 mg at 01/18/19 0950  . enoxaparin (LOVENOX) injection 30 mg  30 mg Subcutaneous Q24H Earnestine Leys, MD   30 mg at 01/18/19 0957  . HYDROcodone-acetaminophen (NORCO/VICODIN) 5-325 MG per tablet 1 tablet  1 tablet Oral Q4H PRN Earnestine Leys, MD   1 tablet at 01/17/19 1608  . [START ON  01/19/2019] influenza vaccine adjuvanted (FLUAD) injection 0.5 mL  0.5 mL Intramuscular Tomorrow-1000 Amin, Soundra Pilon, MD      . ipratropium-albuterol (DUONEB) 0.5-2.5 (3) MG/3ML nebulizer solution 3 mL  3 mL Nebulization QID PRN Earnestine Leys, MD      . isosorbide mononitrate (IMDUR) 24 hr tablet 30 mg  30 mg Oral Daily Earnestine Leys, MD   30 mg at 01/17/19 0826  . lactated ringers infusion   Intravenous Continuous Earnestine Leys, MD 50 mL/hr at 01/16/19 0300    . lidocaine (LIDODERM) 5 % 1 patch  1 patch Transdermal Q24H Earnestine Leys, MD   1 patch at 01/18/19 1148  . magnesium hydroxide (MILK OF MAGNESIA) suspension 30 mL  30 mL Oral Daily PRN Earnestine Leys, MD   30 mL at 01/16/19 2038  . methocarbamol (ROBAXIN) tablet 500 mg  500 mg Oral Q6H PRN Earnestine Leys, MD   500 mg at 01/17/19 1608   Or  . methocarbamol (ROBAXIN) 500 mg in dextrose 5 % 50 mL IVPB  500 mg Intravenous Q6H PRN Earnestine Leys, MD      . metoCLOPramide (REGLAN) tablet 5-10 mg  5-10 mg Oral Q8H PRN Earnestine Leys, MD       Or  . metoCLOPramide (REGLAN) injection 5-10 mg  5-10 mg Intravenous Q8H PRN Earnestine Leys, MD      . metoprolol tartrate (LOPRESSOR) tablet 100 mg  100 mg Oral BID Earnestine Leys, MD   100 mg at 01/18/19 0950  . morphine 2 MG/ML injection 1 mg  1 mg Intravenous Q2H PRN Earnestine Leys, MD      . ondansetron [facility]) tablet 4 mg  4 mg Oral Q6H PRN Earnestine Leys, MD       Or  . ondansetron [facility]) injection 4 mg  4 mg Intravenous Q6H PRN Earnestine Leys, MD      . Derrill Memo ON 01/19/2019] pneumococcal 23 valent vaccine (PNEUMOVAX-23) injection 0.5 mL  0.5 mL Intramuscular Tomorrow-1000 Lorella Nimrod, MD      . sodium phosphate (FLEET) 7-19 GM/118ML enema 1 enema  1 enema Rectal Once PRN Earnestine Leys, MD         Discharge Medications: Please see discharge summary for a list of discharge medications.  Relevant Imaging Results:  Relevant Lab Results:   Additional Information SS# [id number]  Norina Buzzard, RN

## 2019-01-18 NOTE — Progress Notes (Addendum)
Met with pt and daughter Sheryl Suarez) to discuss the D/C plan. Informed them that she was evaluated by PT and they are recommending SNF. They both agreed with SNF. Pt prefers to go to [facility] and daughter agrees. Informed them that I will fax her paperwork to [facility] and other facilities in the area and they agreed. FL2 faxed to [facility] and other facilities. Cindy's cell phone is [phone]. Will continue to f/u to assist with the D/C plan.

## 2019-01-18 NOTE — NC FL2 (Signed)
[place] LEVEL OF CARE SCREENING TOOL     IDENTIFICATION  Patient Name: Sheryl Suarez Birthdate: [birth year] Sex: female Admission Date (Current Location): 01/15/2019  [place] and [place] Number:  Engineering geologist and Address:  [facility], [address], [place], [place] [address]      Provider Number: [id number]  Attending Physician Name and Address:  Lorella Nimrod, MD  Relative Name and Phone Number:  Ernest Pine [phone]    Current Level of Care: Hospital Recommended Level of Care: [place] Prior Approval Number: [id number]  Date Approved/Denied:   PASRR Number: [id number]  Discharge Plan: SNF    Current Diagnoses: Patient Active Problem List   Diagnosis Date Noted  . Closed fracture of right olecranon process   . Fall   . Generalized weakness   . Closed fracture dislocation of right elbow 01/15/2019  . Closed rib fracture 01/15/2019  . HTN (hypertension), benign 01/15/2019  . Chronic systolic CHF (congestive heart failure) ([place]) 01/15/2019  . CAD (coronary artery disease) 01/15/2019  . Atrial fibrillation, chronic ([place]) 01/15/2019  . Rhabdomyolysis 01/15/2019  . GI bleed 08/18/2015    Orientation RESPIRATION BLADDER Height & Weight     Self, Time, Situation, Place  Normal Continent Weight: 47.6 kg Height:  5\' 2"  (157.5 cm)  BEHAVIORAL SYMPTOMS/MOOD NEUROLOGICAL BOWEL NUTRITION STATUS      Continent Diet  AMBULATORY STATUS COMMUNICATION OF NEEDS Skin   Total Care Verbally Surgical wounds, Bruising                       Personal Care Assistance Level of Assistance  Bathing, Dressing Bathing Assistance: Limited assistance Feeding assistance: Independent Dressing Assistance: Limited assistance     Functional Limitations Info             SPECIAL CARE FACTORS FREQUENCY  PT (By licensed PT), OT (By licensed OT)     PT Frequency: 5x/week OT Frequency: 5x/week           Contractures Contractures Info: Not present    Additional Factors Info  Code Status Code Status Info: DNR Allergies Info: No Known Allergies           Current Medications (01/18/2019):  This is the current hospital active medication list Current Facility-Administered Medications  Medication Dose Route Frequency Provider Last Rate Last Dose  . 0.45 % sodium chloride infusion   Intravenous Continuous Earnestine Leys, MD 75 mL/hr at 01/17/19 1253    . aspirin EC tablet 81 mg  81 mg Oral Daily Lorella Nimrod, MD   81 mg at 01/18/19 1527  . bisacodyl (DULCOLAX) suppository 10 mg  10 mg Rectal Daily PRN Earnestine Leys, MD      . calcium-vitamin D (OSCAL WITH D) 500-200 MG-UNIT per tablet 1 tablet  1 tablet Oral BID Earnestine Leys, MD   1 tablet at 01/18/19 [phone]  . diltiazem (CARDIZEM CD) 24 hr capsule 120 mg  120 mg Oral Daily Earnestine Leys, MD   120 mg at 01/17/19 0827  . docusate sodium (COLACE) capsule 100 mg  100 mg Oral BID Earnestine Leys, MD   100 mg at 01/18/19 0950  . enoxaparin (LOVENOX) injection 30 mg  30 mg Subcutaneous Q24H Earnestine Leys, MD   30 mg at 01/18/19 0957  . HYDROcodone-acetaminophen (NORCO/VICODIN) 5-325 MG per tablet 1 tablet  1 tablet Oral Q4H PRN Earnestine Leys, MD   1 tablet at 01/17/19 1608  . [START ON  01/19/2019] influenza vaccine adjuvanted (FLUAD) injection 0.5 mL  0.5 mL Intramuscular Tomorrow-1000 Amin, Soundra Pilon, MD      . ipratropium-albuterol (DUONEB) 0.5-2.5 (3) MG/3ML nebulizer solution 3 mL  3 mL Nebulization QID PRN Earnestine Leys, MD      . isosorbide mononitrate (IMDUR) 24 hr tablet 30 mg  30 mg Oral Daily Earnestine Leys, MD   30 mg at 01/17/19 0826  . lactated ringers infusion   Intravenous Continuous Earnestine Leys, MD 50 mL/hr at 01/16/19 0300    . lidocaine (LIDODERM) 5 % 1 patch  1 patch Transdermal Q24H Earnestine Leys, MD   1 patch at 01/18/19 1148  . magnesium hydroxide (MILK OF MAGNESIA) suspension 30 mL  30 mL Oral Daily PRN Earnestine Leys, MD   30 mL at 01/16/19 2038  . methocarbamol (ROBAXIN) tablet 500 mg  500 mg Oral Q6H PRN Earnestine Leys, MD   500 mg at 01/17/19 1608   Or  . methocarbamol (ROBAXIN) 500 mg in dextrose 5 % 50 mL IVPB  500 mg Intravenous Q6H PRN Earnestine Leys, MD      . metoCLOPramide (REGLAN) tablet 5-10 mg  5-10 mg Oral Q8H PRN Earnestine Leys, MD       Or  . metoCLOPramide (REGLAN) injection 5-10 mg  5-10 mg Intravenous Q8H PRN Earnestine Leys, MD      . metoprolol tartrate (LOPRESSOR) tablet 100 mg  100 mg Oral BID Earnestine Leys, MD   100 mg at 01/18/19 0950  . morphine 2 MG/ML injection 1 mg  1 mg Intravenous Q2H PRN Earnestine Leys, MD      . ondansetron [facility]) tablet 4 mg  4 mg Oral Q6H PRN Earnestine Leys, MD       Or  . ondansetron [facility]) injection 4 mg  4 mg Intravenous Q6H PRN Earnestine Leys, MD      . Derrill Memo ON 01/19/2019] pneumococcal 23 valent vaccine (PNEUMOVAX-23) injection 0.5 mL  0.5 mL Intramuscular Tomorrow-1000 Lorella Nimrod, MD      . sodium phosphate (FLEET) 7-19 GM/118ML enema 1 enema  1 enema Rectal Once PRN Earnestine Leys, MD         Discharge Medications: Please see discharge summary for a list of discharge medications.  Relevant Imaging Results:  Relevant Lab Results:   Additional Information SS# [id number]  Norina Buzzard, RN

## 2019-01-18 NOTE — Care Management Important Message (Signed)
Important Message  Patient Details  Name: Sheryl Suarez MRN: [id number] Date of Birth: 07/24/1923   Medicare Important Message Given:  Yes     Dannette Barbara 01/18/2019, 11:01 AM

## 2019-01-18 NOTE — Progress Notes (Signed)
PROGRESS NOTE    Sheryl Suarez  [id number] DOB: 03-Oct-1923 DOA: 01/15/2019 PCP: Leonel Ramsay, MD   Brief Narrative:  Sheryl Suarez is a 85 y.o. female with medical history significant of HTN, CAD, s/p CABG, sCHF (EF 30%), A-fib not on [facility], h/o breast cancer, presented to ED after falls. Pt lives alone and recently had multiple falls. She fell at home yesterday afternoon and was assisted up.  She fell again at night round 8pm, not able to get up. She lied on the floor until this morning her daughter visited her.  She had large bruise on her left chest and shoulder.  Images of left elbow shows Acute distracted fracture of the olecranon process of the proximal ulna and some hemarthrosis.  CT head and C-spine was without any acute abnormality.  Chest x-ray and right shoulder x-ray shows mildly displaced fractures of anterolateral aspects of right second and third ribs. Patient was taken to the OR  by orthopedic for ORIF of right elbow.  Subjective: Patient is feeling better when seen this morning.  Sitting comfortably in chair.  Daughter was in the room.  She wants to go to [facility] facility if possible.  Assessment & Plan:   Principal Problem:   Closed fracture dislocation of right elbow Active Problems:   Closed rib fracture   HTN (hypertension), benign   Chronic systolic CHF (congestive heart failure) (HCC)   CAD (coronary artery disease)   Atrial fibrillation, chronic (HCC)   Rhabdomyolysis   Closed fracture of right olecranon process   Fall   Generalized weakness  Acute distracted fracture of the olecranon process of the proximal ulna. Patient was taken to the OR for ORIF of right elbow by orthopedic. Postop day 1. -PT/OT are recommending SNF placement for rehab. -Social worker working on placement. -Pain management.  2nd and 3rd rib fracture. -Continue with incentive spirometry and pain management.  Rhabdomyolysis.  Most likely after remained on floor for long  time. CK improving. -Continue monitoring. -Encourage increase p.o. intake and IV fluids with caution as patient has systolic heart failure.  Macrocytic anemia.  B12 within normal range.  RBC folate pending.  Elevated troponin.  Most likely demand.  Denies any chest pain or shortness of breath.  HTN, CAD, s/p CABG, sCHF (EF 30%), A-fib not on AC.  Cardiology evaluated her for surgical clearance.  Advising continuation of home diltiazem, metoprolol, isosorbide.  Blood pressure mildly elevated today. -Hold Bumex due to rhabdomyolysis. -Monitor volume status closely. -Not a candidate for anticoagulation due to frequent falls.  Objective: Vitals:   01/17/19 1157 01/17/19 1700 01/17/19 2329 01/18/19 0950  BP: 132/75 (!) 99/41 128/84 (!) 104/55  Pulse: 85 (!) 56 73 78  Resp: 16  16 18   Temp: 97.9 F (36.6 C) 97.9 F (36.6 C)  97.8 F (36.6 C)  TempSrc: Oral Oral  Oral  SpO2: 98% 97% 98% 97%  Weight:      Height:        Intake/Output Summary (Last 24 hours) at 01/18/2019 1600 Last data filed at 01/18/2019 0930 Gross per 24 hour  Intake 589.1 ml  Output 75 ml  Net 514.1 ml   Filed Weights   01/16/19 1054  Weight: 47.6 kg    Examination:  General exam: Appears calm and comfortable  Respiratory system: Clear to auscultation. Respiratory effort normal. Cardiovascular system: Irregularly irregular, no JVD, murmurs, rubs, gallops or clicks. No pedal edema. Gastrointestinal system: Abdomen is nondistended, soft and nontender.  No organomegaly or masses felt. Normal bowel sounds heard. Central nervous system: Alert and oriented. No focal neurological deficits. Extremities: Right arm in sling, edematous hand, pulses intact. Skin: Multiple ecchymoses involving right shoulder and chest, seems improving. Psychiatry: Judgement and insight appear normal. Mood & affect appropriate.   DVT prophylaxis: SCDs Code Status: DNR Family Communication: Daughter was updated at bedside  Disposition Plan: Pending SNF bed availability.  Consultants:   Orthopedic  Cardiology  Procedures:  ORIF of right elbow.  Antimicrobials:  Cefazolin and clindamycin for surgery.  Data Reviewed: I have personally reviewed following labs and imaging studies  CBC: Recent Labs  Lab 01/15/19 1006 01/16/19 0350 01/17/19 0353  WBC 9.0 6.1 7.9  NEUTROABS 7.3  --   --   HGB 11.5* 9.7* 10.5*  HCT 37.4 31.3* 35.0*  MCV 100.0 99.1 103.2*  PLT 108* 109* [id number]*   Basic Metabolic Panel: Recent Labs  Lab 01/15/19 1149 01/16/19 0350 01/17/19 0353 01/18/19 0519  NA 141 141 138 136  K 4.2 3.9 4.1 4.1  CL 105 108 106 104  CO2 24 25 21* 24  GLUCOSE 99 86 74 103*  BUN 42* 44* 40* 40*  CREATININE 0.98 0.94 1.00 0.89  CALCIUM 9.7 9.3 9.0 9.1   GFR: Estimated Creatinine Clearance: 29.7 mL/min (by C-G formula based on SCr of 0.89 mg/dL). Liver Function Tests: Recent Labs  Lab 01/15/19 1149 01/17/19 0353  AST 65* 41  ALT 34 28  ALKPHOS 119 101  BILITOT 1.8* 1.5*  PROT 7.2 6.2*  ALBUMIN 4.0 3.3*   No results for input(s): LIPASE, AMYLASE in the last 168 hours. No results for input(s): AMMONIA in the last 168 hours. Coagulation Profile: Recent Labs  Lab 01/15/19 1854 01/16/19 0350  INR 1.3* 1.3*   Cardiac Enzymes: Recent Labs  Lab 01/15/19 1149 01/16/19 0350 01/17/19 0353  CKTOTAL 1,926* 790* 358*   BNP (last 3 results) No results for input(s): PROBNP in the last 8760 hours. HbA1C: No results for input(s): HGBA1C in the last 72 hours. CBG: No results for input(s): GLUCAP in the last 168 hours. Lipid Profile: No results for input(s): CHOL, HDL, LDLCALC, TRIG, CHOLHDL, LDLDIRECT in the last 72 hours. Thyroid Function Tests: No results for input(s): TSH, T4TOTAL, FREET4, T3FREE, THYROIDAB in the last 72 hours. Anemia Panel: Recent Labs    01/17/19 0847  VITAMINB12 618   Sepsis Labs: No results for input(s): PROCALCITON, LATICACIDVEN in the last 168 hours.   Recent Results (from the past 240 hour(s))  SARS CORONAVIRUS 2 (TAT 6-24 HRS) Nasopharyngeal Nasopharyngeal Swab     Status: None   Collection Time: 01/15/19  1:37 PM   Specimen: Nasopharyngeal Swab  Result Value Ref Range Status   SARS Coronavirus 2 NEGATIVE NEGATIVE Final    Comment: (NOTE) SARS-CoV-2 target nucleic acids are NOT DETECTED. The SARS-CoV-2 RNA is generally detectable in upper and lower respiratory specimens during the acute phase of infection. Negative results do not preclude SARS-CoV-2 infection, do not rule out co-infections with other pathogens, and should not be used as the sole basis for treatment or other patient management decisions. Negative results must be combined with clinical observations, patient history, and epidemiological information. The expected result is Negative. Fact Sheet for Patients: SugarRoll.be Fact Sheet for Healthcare Providers: https://www.woods-mathews.com/ This test is not yet approved or cleared by the [place] FDA and  has been authorized for detection and/or diagnosis of SARS-CoV-2 by FDA under an Emergency Use Authorization (EUA). This EUA will remain  in effect (meaning this test can be used) for the duration of the COVID-19 declaration under Section 56 4(b)(1) of the Act, 21 U.S.C. section 360bbb-3(b)(1), unless the authorization is terminated or revoked sooner. Performed at [facility], [place] [address] [place], [place] [address]   Surgical PCR screen     Status: None   Collection Time: 01/16/19  5:00 AM   Specimen: Nasal Mucosa; Nasal Swab  Result Value Ref Range Status   MRSA, PCR NEGATIVE NEGATIVE Final   Staphylococcus aureus NEGATIVE NEGATIVE Final    Comment: (NOTE) The Xpert SA Assay (FDA approved for NASAL specimens in patients 52 years of age and older), is one component of a comprehensive surveillance program. It is not intended to diagnose infection nor to  guide or monitor treatment. Performed at [facility], [address], [place], [place] [address]      Radiology Studies: No results found.  Scheduled Meds: . aspirin EC  81 mg Oral Daily  . calcium-vitamin D  1 tablet Oral BID  . diltiazem  120 mg Oral Daily  . docusate sodium  100 mg Oral BID  . enoxaparin (LOVENOX) injection  30 mg Subcutaneous Q24H  . [START ON 01/19/2019] influenza vaccine adjuvanted  0.5 mL Intramuscular Tomorrow-1000  . isosorbide mononitrate  30 mg Oral Daily  . lidocaine  1 patch Transdermal Q24H  . metoprolol tartrate  100 mg Oral BID  . [START ON 01/19/2019] pneumococcal 23 valent vaccine  0.5 mL Intramuscular Tomorrow-1000   Continuous Infusions: . sodium chloride 75 mL/hr at 01/17/19 1253  . lactated ringers 50 mL/hr at 01/16/19 0300  . methocarbamol (ROBAXIN) IV       LOS: 3 days   Time spent: 35 minutes.  I personally reviewed her chart and previous notes.  Lorella Nimrod, MD [facility] Pager [phone]  If 7PM-7AM, please contact night-coverage [email or web address] Password [facility] 01/18/2019, 4:00 PM   This record has been created using Systems analyst. Errors have been sought and corrected,but may not always be located. Such creation errors do not reflect on the standard of care.

## 2019-01-18 NOTE — Progress Notes (Signed)
Physical Therapy Treatment Patient Details Name: Sheryl Suarez MRN: [id number] DOB: Jun 04, 1923 Today's Date: 01/18/2019    History of Present Illness Sheryl Suarez is a 85 y.o. female with medical history significant of HTN, CAD, s/p CABG, sCHF (EF 30%), A-fib not on [facility], h/o breast cancer, presented to ED on 01/15/2019 after multiple falls. She laid on the floor overnight before being found by her daughter. Radiograph showed acute distracted fracture of the olecranon process of proximal ulna. She was admitted to the hospital for closed fracture dislocation of R elbow and closed 2nd and third rib fracture, rhabdomyolysis, and elevated troponin likely related to rhabdo. She underwent ORIF to the R elbow 01/16/2019. NWB R UE per Dr. Donney Rankins    PT Comments    Pt in bed upon arrival agreeable to participate. DTR in room. Pt continues to express frustration with her current state. Heavy physical assist needed for bed mobility and transfers. On feet, pt is quite ataxic with both poor awareness of LOB as well as poor fluency in correcting LOB, often requiring mod-maxA and 30-40 seconds to reestablish independent standing. Pt eventually able to balance with modified independence using LUE on chair back for a short period. Pt requires several reminders to avoid using RUE for stabilization or support, especially when attempting use of quad can which ultimately is more distraction than avail.     Follow Up Recommendations  SNF;Supervision for mobility/OOB     Equipment Recommendations  Other (comment);3in1 (PT)    Recommendations for Other Services OT consult     Precautions / Restrictions Precautions Precautions: Fall Restrictions Weight Bearing Restrictions: Yes RUE Weight Bearing: Non weight bearing Other Position/Activity Restrictions: B LE WBAT    Mobility  Bed Mobility Overal bed mobility: Needs Assistance Bed Mobility: Supine to Sit     Supine to sit: Max assist     General bed  mobility comments: difficulty c trunk righting and stabilization at EOB.  Transfers Overall transfer level: Needs assistance Equipment used: None;1 person hand held assist Transfers: Sit to/from [facility] Sit to Stand: Mod assist Stand pivot transfers: Max assist(very unsteady, poor capacity for trunk control)       General transfer comment: Unsuccessful attempt with Hemiwalker; eventually providing chair back for LUE for subseuqent transfers.  Ambulation/Gait Ambulation/Gait assistance: (not safe to attempt in AM)               Stairs             Wheelchair Mobility    Modified Rankin (Stroke Patients Only)       Balance                                            Cognition Arousal/Alertness: Awake/alert Behavior During Therapy: WFL for tasks assessed/performed Overall Cognitive Status: Within Functional Limits for tasks assessed                                        Exercises Other Exercises Other Exercises: STS from recliner UE support 3x90sec for establishing balance. intermittent min-modA needed >75% of time up    General Comments        Pertinent Vitals/Pain Pain Assessment: No/denies pain Pain Score: 2  Pain Location: R arm Pain Descriptors / Indicators: Aching;Sore Pain  Intervention(s): Limited activity within patient's tolerance;Monitored during session;Premedicated before session    [place] expects to be discharged to:: Private residence Living Arrangements: Alone Available Help at Discharge: Family Type of Home: House Home Access: [place]: One [place]: [place] - single point;Walker - 2 wheels;Shower seat - built in Additional Comments: not really using RW; noncompliant with medical alert; DTR estimates falls in last 6 months: 20x    Prior Function Level of Independence: Independent with assistive device(s)      Comments:  Patient reports she ambulated with a single point cane, and is independent with dressing, bathing, housework. Her daughter does all errands outside the home for her since she stopped driving in May when her lisence expired.   PT Goals (current goals can now be found in the care plan section) Acute Rehab PT Goals Patient Stated Goal: return home PT Goal Formulation: With patient Time For Goal Achievement: 01/31/19 Potential to Achieve Goals: Fair Progress towards PT goals: Progressing toward goals    Frequency    BID      PT Plan Current plan remains appropriate    Co-evaluation              AM-PAC PT "6 Clicks" Mobility   Outcome Measure  Help needed turning from your back to your side while in a flat bed without using bedrails?: Total Help needed moving from lying on your back to sitting on the side of a flat bed without using bedrails?: Total Help needed moving to and from a bed to a chair (including a wheelchair)?: Total Help needed standing up from a chair using your arms (e.g., wheelchair or bedside chair)?: Total Help needed to walk in hospital room?: Total Help needed climbing 3-5 steps with a railing? : Total 6 Click Score: 6    End of Session Equipment Utilized During Treatment: Gait belt Activity Tolerance: Patient tolerated treatment well Patient left: in chair;with call bell/phone within reach;with chair alarm set;with SCD's reapplied Nurse Communication: Mobility status;Weight bearing status;Precautions PT Visit Diagnosis: Unsteadiness on feet (R26.81);Other abnormalities of gait and mobility (R26.89);Repeated falls (R29.6);Muscle weakness (generalized) (M62.81)     Time: [id number] PT Time Calculation (min) (ACUTE ONLY): 39 min  Charges:  $Therapeutic Exercise: 8-22 mins $Neuromuscular Re-education: 23-37 mins                     3:49 PM, 01/18/19 Sheryl Suarez, PT, DPT Physical Therapist - [facility]   [phone] ([place])    Buccola,Allan C 01/18/2019, 3:45 PM

## 2019-01-18 NOTE — Evaluation (Signed)
Occupational Therapy Evaluation Patient Details Name: Sheryl Suarez MRN: [id number] DOB: 19-Dec-1923 Today's Date: 01/18/2019    History of Present Illness Sheryl Suarez is a 85 y.o. female with medical history significant of HTN, CAD, s/p CABG, sCHF (EF 30%), A-fib not on [facility], h/o breast cancer, presented to ED on 01/15/2019 after multiple falls. She laid on the floor overnight before being found by her daughter. Radiograph showed acute distracted fracture of the olecranon process of proximal ulna. She was admitted to the hospital for closed fracture dislocation of R elbow and closed 2nd and third rib fracture, rhabdomyolysis, and elevated troponin likely related to rhabdo. She underwent ORIF to the R elbow 01/16/2019. NWB R UE per Dr. Donney Rankins   Clinical Impression    Patient is 85 year old woman who was living at home alone and had multiple falls and sustained a dislocation of R elbow and underwent ORIF on 01/16/19.  She was evaluated this date by OT with her daughter Sheryl Suarez in the room with her. Patient was independent prior to admission and is hoping to return home alone again. She was using a cane at home and presents with moderate balance issues. Patient has her daughter and 2 sons in the area who can help her as needed.  Patient has orders for RUE immobilized and is non WBing per MD and is encouraged to move fingers as tolerated. Bruising noted along R thumb and MCPs and in dorsal aspect of L hand and along chest.  Patient presents with decreased strength, decreased ability to perform self care tasks and needs min to mod assist for ADLs.  She will need further instructions for managing daily self care tasks with use of one hand/non-dominant only. Patient will benefit from skilled OT care to address these limitations and improve independence in daily tasks to return home.Rec SNF after discharge to increase independence in ADLs, functional use of dominant RUE/hand and functional balance.     Follow Up Recommendations  SNF    Equipment Recommendations  3 in 1 bedside commode;Other (comment)(reacher, elastic shoe laces, LH shoe horn)    Recommendations for Other Services       Precautions / Restrictions Precautions Precautions: Fall Restrictions Weight Bearing Restrictions: Yes RUE Weight Bearing: Non weight bearing Other Position/Activity Restrictions: B LE WBAT      Mobility Bed Mobility                  Transfers                      Balance                                           ADL either performed or assessed with clinical judgement   ADL Overall ADL's : Needs assistance/impaired Eating/Feeding: Set up;Minimal assistance;Sitting Eating/Feeding Details (indicate cue type and reason): Pt having difficulty feeding self due to needing to use her non dominant hand now due to RUE in sling and non WB. Given red foam utensil holder for self feeding. Grooming: Wash/dry hands;Wash/dry face;Oral care;Brushing hair;Set up;Minimal assistance;Sitting   Upper Body Bathing: Moderate assistance;Set up;Sitting   Lower Body Bathing: Moderate assistance;Set up;Cueing for safety   Upper Body Dressing : Moderate assistance;Set up;Sitting   Lower Body Dressing: Moderate assistance;Set up;Cueing for safety;Sit to/from stand Lower Body Dressing Details (indicate cue type and reason): pt  with poor balance and needs assist and max cues for safety and problem solving and leans posteriorly.         Tub/ Shower Transfer: Moderate assistance   Functional mobility during ADLs: Moderate assistance General ADL Comments: Pt with dominant RUE in sling and non WB and able to move fingers and thumb but not able to use for any ADLs and has poor balance and problem solving skills and difficulty following simple instructions with posterior lean.     Vision Patient Visual Report: No change from baseline       Perception     Praxis       Pertinent Vitals/Pain Pain Assessment: 0-10 Pain Score: 2  Pain Location: R arm Pain Descriptors / Indicators: Aching;Sore Pain Intervention(s): Limited activity within patient's tolerance;Monitored during session;Premedicated before session     Hand Dominance Right   Extremity/Trunk Assessment Upper Extremity Assessment Upper Extremity Assessment: RUE deficits/detail RUE Deficits / Details: NWB, able to move fingers. Extensive echymosis noted at shoulder and brusing at thumb and near MCPs. Hand was cold but L was as well and encouraged to keep moving fingers as much as possible but no weight bearing or squeezing or pulling with RUE/hand. RUE: Unable to fully assess due to immobilization RUE Sensation: WNL RUE Coordination: decreased fine motor;decreased gross motor LUE Deficits / Details: L UE generalized weakness.       Cervical / Trunk Assessment Cervical / Trunk Assessment: Normal   Communication Communication Communication: HOH   Cognition Arousal/Alertness: Awake/alert Behavior During Therapy: WFL for tasks assessed/performed Overall Cognitive Status: Within Functional Limits for tasks assessed                                     General Comments       Exercises     Shoulder Instructions      Home Living Family/patient expects to be discharged to:: Private residence Living Arrangements: Alone Available Help at Discharge: Family Type of Home: House Home Access: Ramped entrance     [place]: One level     Bathroom Shower/Tub: Occupational psychologist: Standard Bathroom Accessibility: Yes How Accessible: Accessible via walker Home Equipment: [place] - single point;Walker - 2 wheels;Shower seat - built in   Additional Comments: not really using RW; noncompliant with medical alert; DTR estimates falls in last 6 months: 20x      Prior Functioning/Environment Level of Independence: Independent with assistive device(s)         Comments: Patient reports she ambulated with a single point cane, and is independent with dressing, bathing, housework. Her daughter does all errands outside the home for her since she stopped driving in May when her lisence expired.        OT Problem List: Decreased strength;Decreased range of motion;Decreased activity tolerance;Impaired balance (sitting and/or standing);Impaired UE functional use;Decreased safety awareness;Pain      OT Treatment/Interventions: Self-care/ADL training;Balance training;DME and/or AE instruction;Patient/family education    OT Goals(Current goals can be found in the care plan section) Acute Rehab OT Goals Patient Stated Goal: return home OT Goal Formulation: With patient/family Time For Goal Achievement: 01/31/19 Potential to Achieve Goals: Good ADL Goals Pt Will Perform Eating: with set-up;with modified independence;with adaptive utensils;sitting Pt Will Perform Upper Body Dressing: with set-up;with min assist;sitting Pt Will Perform Lower Body Dressing: with set-up;with min assist;sit to/from stand Pt Will Transfer to Toilet: with set-up;with min assist;ambulating;bedside  commode;regular height toilet  OT Frequency: Min 2X/week   Barriers to D/C:    pt was lliving at home alone prior to fall and ORIF.  Has 1 daughter and 2 sons in the area who can help after going to rehab.       Co-evaluation              AM-PAC OT "6 Clicks" Daily Activity     Outcome Measure Help from another person eating meals?: A Little Help from another person taking care of personal grooming?: A Little Help from another person toileting, which includes using toliet, bedpan, or urinal?: A Lot Help from another person bathing (including washing, rinsing, drying)?: A Lot Help from another person to put on and taking off regular upper body clothing?: A Lot Help from another person to put on and taking off regular lower body clothing?: A Lot 6 Click Score: 14   End of  Session Equipment Utilized During Treatment: Gait belt  Activity Tolerance: Patient tolerated treatment well Patient left: in chair;with call bell/phone within reach;with chair alarm set  OT Visit Diagnosis: Unsteadiness on feet (R26.81);Repeated falls (R29.6);History of falling (Z91.81);Pain;Other (comment)(RUE in sling and soft cast after ORIF of R UE) Pain - Right/Left: Right Pain - part of body: Arm                Time: 1100-1127 OT Time Calculation (min): 27 min Charges:  OT General Charges $OT Visit: 1 Visit OT Evaluation $OT Eval Low Complexity: 1 Low OT Treatments $Self Care/Home Management : 8-22 mins  Chrys Racer, OTR/L, [place] ascom [phone] 01/18/19, 1:51 PM

## 2019-01-18 NOTE — Progress Notes (Signed)
  Subjective:  POD #2 s/p ORIF right olecranon.   Patient reports right elbow pain as mild.  Her daughter is at the bedside.  Objective:   VITALS:   Vitals:   01/17/19 1157 01/17/19 1700 01/17/19 2329 01/18/19 0950  BP: 132/75 (!) 99/41 128/84 (!) 104/55  Pulse: 85 (!) 56 73 78  Resp: 16  16 18   Temp: 97.9 F (36.6 C) 97.9 F (36.6 C)  97.8 F (36.6 C)  TempSrc: Oral Oral  Oral  SpO2: 98% 97% 98% 97%  Weight:      Height:        PHYSICAL EXAM: Right upper extremity; significant ecchymosis over the right upper chest and arm is resolving.  Her compartments are soft and compressible.  Patient has mild swelling of her digits in the right hand but he can flex and extend her digits actively and has intact sensation light touch.  Her posterior splint is clean dry and intact.  A sling is on the right upper extremity.   LABS  Results for orders placed or performed during the hospital encounter of 01/15/19 (from the past 24 hour(s))  Basic metabolic panel     Status: Abnormal   Collection Time: 01/18/19  5:19 AM  Result Value Ref Range   Sodium 136 135 - 145 mmol/L   Potassium 4.1 3.5 - 5.1 mmol/L   Chloride 104 98 - 111 mmol/L   CO2 24 22 - 32 mmol/L   Glucose, Bld 103 (H) 70 - 99 mg/dL   BUN 40 (H) 8 - 23 mg/dL   Creatinine, Ser 0.89 0.44 - 1.00 mg/dL   Calcium 9.1 8.9 - 10.3 mg/dL   GFR calc non Af Amer 56 (L) >60 mL/min   GFR calc Af Amer >60 >60 mL/min   Anion gap 8 5 - 15    Dg Elbow 2 Views Right  Result Date: 01/16/2019 CLINICAL DATA:  Olecranon ORIF. EXAM: RIGHT ELBOW - 2 VIEW COMPARISON:  Right elbow x-rays from yesterday. FINDINGS: Interval ORIF of the olecranon fracture with 2 K-wires and a cerclage wire. Alignment is normal. Small amount of expected intra-articular air and posterior subcutaneous emphysema. Osteopenia. IMPRESSION: Interval olecranon ORIF, now in normal anatomic alignment. Electronically Signed   By: Titus Dubin M.D.   On: 01/16/2019 15:12     Assessment/Plan: 2 Days Post-Op   Principal Problem:   Closed fracture dislocation of right elbow Active Problems:   Closed rib fracture   HTN (hypertension), benign   Chronic systolic CHF (congestive heart failure) (HCC)   CAD (coronary artery disease)   Atrial fibrillation, chronic (HCC)   Rhabdomyolysis   Closed fracture of right olecranon process   Fall   Generalized weakness  Patient stable postop.  Patient is awaiting placement for skilled nursing facility.  Continue nonweightbearing on the right upper extremity.  Use a hemiwalker or cane for assistance with ambulation.  Patient will follow up with Dr. Sabra Heck in approximately 7 to 10 days following discharge from the hospital.    Thornton Park , MD 01/18/2019, 2:38 PM

## 2019-01-19 LAB — BASIC METABOLIC PANEL
Anion gap: 5 (ref 5–15)
BUN: 31 mg/dL — ABNORMAL HIGH (ref 8–23)
CO2: 25 mmol/L (ref 22–32)
Calcium: 9.2 mg/dL (ref 8.9–10.3)
Chloride: 104 mmol/L (ref 98–111)
Creatinine, Ser: 0.83 mg/dL (ref 0.44–1.00)
GFR calc Af Amer: >60 mL/min (ref >60)
GFR calc non Af Amer: >60 mL/min (ref >60)
Glucose, Bld: 97 mg/dL (ref 70–99)
Potassium: 4.2 mmol/L (ref 3.5–5.1)
Sodium: 134 mmol/L — ABNORMAL LOW (ref 135–145)

## 2019-01-19 NOTE — Progress Notes (Signed)
PROGRESS NOTE    Sheryl Suarez  [id number] DOB: 1923-05-11 DOA: 01/15/2019 PCP: Leonel Ramsay, MD   Brief Narrative:  Sheryl Suarez is a 85 y.o. female with medical history significant of HTN, CAD, s/p CABG, sCHF (EF 30%), A-fib not on [facility], h/o breast cancer, presented to ED after falls. Pt lives alone and recently had multiple falls. She fell at home yesterday afternoon and was assisted up.  She fell again at night round 8pm, not able to get up. She lied on the floor until this morning her daughter visited her.  She had large bruise on her left chest and shoulder.  Images of left elbow shows Acute distracted fracture of the olecranon process of the proximal ulna and some hemarthrosis.  CT head and C-spine was without any acute abnormality.  Chest x-ray and right shoulder x-ray shows mildly displaced fractures of anterolateral aspects of right second and third ribs. Patient was taken to the OR  by orthopedic for ORIF of right elbow.  Subjective: Patient is feeling better when seen this morning.  Sitting comfortably in chair.  Daughter was in the room.  She had a bed offer at [facility] for tomorrow.  Assessment & Plan:   Principal Problem:   Closed fracture dislocation of right elbow Active Problems:   Closed rib fracture   HTN (hypertension), benign   Chronic systolic CHF (congestive heart failure) (HCC)   CAD (coronary artery disease)   Atrial fibrillation, chronic (HCC)   Rhabdomyolysis   Closed fracture of right olecranon process   Fall   Generalized weakness  Acute distracted fracture of the olecranon process of the proximal ulna. Patient was taken to the OR for ORIF of right elbow by orthopedic. Postop day 1. -PT/OT are recommending SNF placement for rehab. -She had a bed of her at [facility] for tomorrow. -Continue pain management. -Covid testing ordered today for SNF.  2nd and 3rd rib fracture. -Continue with incentive spirometry and pain management.   Rhabdomyolysis.  Most likely after remained on floor for long time. CK improving. -Encourage increase p.o. intake and IV fluids with caution as patient has systolic heart failure.  Macrocytic anemia.  B12 within normal range.  RBC folate pending.  Elevated troponin.  Most likely demand.  Denies any chest pain or shortness of breath.  HTN, CAD, s/p CABG, sCHF (EF 30%), A-fib not on [facility].  Cardiology evaluated her for surgical clearance.  Advising continuation of home diltiazem, metoprolol, isosorbide.  Blood pressure mildly elevated today. -Hold Bumex due to rhabdomyolysis. -Monitor volume status closely. -Not a candidate for anticoagulation due to frequent falls.  Objective: Vitals:   01/18/19 0950 01/18/19 1646 01/19/19 0022 01/19/19 0759  BP: (!) 104/55 125/63 126/77 117/73  Pulse: 78 79 96 95  Resp: 18 18 18 17   Temp: 97.8 F (36.6 C) 97.9 F (36.6 C) 98.8 F (37.1 C) 98.5 F (36.9 C)  TempSrc: Oral  Oral Oral  SpO2: 97% 96% 96% 100%  Weight:      Height:       No intake or output data in the 24 hours ending 01/19/19 1443 Filed Weights   01/16/19 1054  Weight: 47.6 kg    Examination:  General exam: Appears calm and comfortable  Respiratory system: Clear to auscultation. Respiratory effort normal. Cardiovascular system: Irregularly irregular, no JVD, murmurs, rubs, gallops or clicks. No pedal edema. Gastrointestinal system: Abdomen is nondistended, soft and nontender. No organomegaly or masses felt. Normal bowel sounds heard. Central  nervous system: Alert and oriented. No focal neurological deficits. Extremities: Right arm in sling, edematous hand, pulses intact. Skin: Multiple ecchymoses involving right shoulder and chest, seems improving. Psychiatry: Judgement and insight appear normal. Mood & affect appropriate.   DVT prophylaxis: SCDs Code Status: DNR Family Communication: Daughter was updated at bedside Disposition Plan: Pending SNF bed availability, most likely  tomorrow.  Consultants:   Orthopedic  Cardiology  Procedures:  ORIF of right elbow.  Antimicrobials:  Cefazolin and clindamycin for surgery.  Data Reviewed: I have personally reviewed following labs and imaging studies  CBC: Recent Labs  Lab 01/15/19 1006 01/16/19 0350 01/17/19 0353  WBC 9.0 6.1 7.9  NEUTROABS 7.3  --   --   HGB 11.5* 9.7* 10.5*  HCT 37.4 31.3* 35.0*  MCV 100.0 99.1 103.2*  PLT 108* 109* [id number]*   Basic Metabolic Panel: Recent Labs  Lab 01/15/19 1149 01/16/19 0350 01/17/19 0353 01/18/19 0519 01/19/19 0403  NA 141 141 138 136 134*  K 4.2 3.9 4.1 4.1 4.2  CL 105 108 106 104 104  CO2 24 25 21* 24 25  GLUCOSE 99 86 74 103* 97  BUN 42* 44* 40* 40* 31*  CREATININE 0.98 0.94 1.00 0.89 0.83  CALCIUM 9.7 9.3 9.0 9.1 9.2   GFR: Estimated Creatinine Clearance: 31.8 mL/min (by C-G formula based on SCr of 0.83 mg/dL). Liver Function Tests: Recent Labs  Lab 01/15/19 1149 01/17/19 0353  AST 65* 41  ALT 34 28  ALKPHOS 119 101  BILITOT 1.8* 1.5*  PROT 7.2 6.2*  ALBUMIN 4.0 3.3*   No results for input(s): LIPASE, AMYLASE in the last 168 hours. No results for input(s): AMMONIA in the last 168 hours. Coagulation Profile: Recent Labs  Lab 01/15/19 1854 01/16/19 0350  INR 1.3* 1.3*   Cardiac Enzymes: Recent Labs  Lab 01/15/19 1149 01/16/19 0350 01/17/19 0353  CKTOTAL 1,926* 790* 358*   BNP (last 3 results) No results for input(s): PROBNP in the last 8760 hours. HbA1C: No results for input(s): HGBA1C in the last 72 hours. CBG: No results for input(s): GLUCAP in the last 168 hours. Lipid Profile: No results for input(s): CHOL, HDL, LDLCALC, TRIG, CHOLHDL, LDLDIRECT in the last 72 hours. Thyroid Function Tests: No results for input(s): TSH, T4TOTAL, FREET4, T3FREE, THYROIDAB in the last 72 hours. Anemia Panel: Recent Labs    01/17/19 0847  VITAMINB12 618   Sepsis Labs: No results for input(s): PROCALCITON, LATICACIDVEN in the last 168  hours.  Recent Results (from the past 240 hour(s))  SARS CORONAVIRUS 2 (TAT 6-24 HRS) Nasopharyngeal Nasopharyngeal Swab     Status: None   Collection Time: 01/15/19  1:37 PM   Specimen: Nasopharyngeal Swab  Result Value Ref Range Status   SARS Coronavirus 2 NEGATIVE NEGATIVE Final    Comment: (NOTE) SARS-CoV-2 target nucleic acids are NOT DETECTED. The SARS-CoV-2 RNA is generally detectable in upper and lower respiratory specimens during the acute phase of infection. Negative results do not preclude SARS-CoV-2 infection, do not rule out co-infections with other pathogens, and should not be used as the sole basis for treatment or other patient management decisions. Negative results must be combined with clinical observations, patient history, and epidemiological information. The expected result is Negative. Fact Sheet for Patients: SugarRoll.be Fact Sheet for Healthcare Providers: https://www.woods-mathews.com/ This test is not yet approved or cleared by the [place] FDA and  has been authorized for detection and/or diagnosis of SARS-CoV-2 by FDA under an Emergency Use Authorization (EUA). This  EUA will remain  in effect (meaning this test can be used) for the duration of the COVID-19 declaration under Section 56 4(b)(1) of the Act, 21 U.S.C. section 360bbb-3(b)(1), unless the authorization is terminated or revoked sooner. Performed at [facility], [place] [address], [place], [place] [address]   Surgical PCR screen     Status: None   Collection Time: 01/16/19  5:00 AM   Specimen: Nasal Mucosa; Nasal Swab  Result Value Ref Range Status   MRSA, PCR NEGATIVE NEGATIVE Final   Staphylococcus aureus NEGATIVE NEGATIVE Final    Comment: (NOTE) The Xpert SA Assay (FDA approved for NASAL specimens in patients 28 years of age and older), is one component of a comprehensive surveillance program. It is not intended to diagnose infection  nor to guide or monitor treatment. Performed at [facility], [address], [place], [place] [address]      Radiology Studies: No results found.  Scheduled Meds: . aspirin EC  81 mg Oral Daily  . calcium-vitamin D  1 tablet Oral BID  . diltiazem  120 mg Oral Daily  . docusate sodium  100 mg Oral BID  . enoxaparin (LOVENOX) injection  30 mg Subcutaneous Q24H  . isosorbide mononitrate  30 mg Oral Daily  . lidocaine  1 patch Transdermal Q24H  . metoprolol tartrate  100 mg Oral BID  . pneumococcal 23 valent vaccine  0.5 mL Intramuscular Tomorrow-1000   Continuous Infusions: . sodium chloride 75 mL/hr at 01/17/19 1253  . lactated ringers 50 mL/hr at 01/16/19 0300  . methocarbamol (ROBAXIN) IV       LOS: 4 days   Time spent: 35 minutes.  I personally reviewed her chart and previous notes.  Lorella Nimrod, MD [facility] Pager [phone]  If 7PM-7AM, please contact night-coverage [email or web address] Password [facility] 01/19/2019, 2:43 PM   This record has been created using Dragon voice recognition software. Errors have been sought and corrected,but may not always be located. Such creation errors do not reflect on the standard of care.

## 2019-01-19 NOTE — Progress Notes (Signed)
  Subjective:  POD #3 s/p right olecranon ORIF.   Patient reports right elbow pain as mild.  Patient is up out of bed to a chair.    Objective:   VITALS:   Vitals:   01/18/19 0950 01/18/19 1646 01/19/19 0022 01/19/19 0759  BP: (!) 104/55 125/63 126/77 117/73  Pulse: 78 79 96 95  Resp: 18 18 18 17   Temp: 97.8 F (36.6 C) 97.9 F (36.6 C) 98.8 F (37.1 C) 98.5 F (36.9 C)  TempSrc: Oral  Oral Oral  SpO2: 97% 96% 96% 100%  Weight:      Height:        PHYSICAL EXAM: Right upper extremity: Splint and dressing is clean dry and intact.  Right sling is in place. Neurovascular intact Sensation intact distally Intact pulses distally Compartment soft  LABS  Results for orders placed or performed during the hospital encounter of 01/15/19 (from the past 24 hour(s))  Basic metabolic panel     Status: Abnormal   Collection Time: 01/19/19  4:03 AM  Result Value Ref Range   Sodium 134 (L) 135 - 145 mmol/L   Potassium 4.2 3.5 - 5.1 mmol/L   Chloride 104 98 - 111 mmol/L   CO2 25 22 - 32 mmol/L   Glucose, Bld 97 70 - 99 mg/dL   BUN 31 (H) 8 - 23 mg/dL   Creatinine, Ser 0.83 0.44 - 1.00 mg/dL   Calcium 9.2 8.9 - 10.3 mg/dL   GFR calc non Af Amer >60 >60 mL/min   GFR calc Af Amer >60 >60 mL/min   Anion gap 5 5 - 15    No results found.  Assessment/Plan: 3 Days Post-Op   Principal Problem:   Closed fracture dislocation of right elbow Active Problems:   Closed rib fracture   HTN (hypertension), benign   Chronic systolic CHF (congestive heart failure) (HCC)   CAD (coronary artery disease)   Atrial fibrillation, chronic (HCC)   Rhabdomyolysis   Closed fracture of right olecranon process   Fall   Generalized weakness  Patient stable postop.  Continue with physical therapy.  Patient awaiting skilled nursing facility placement.  Patient will continue using a hemiwalker or cane for assistance with ambulation.  Patient will follow up with Dr. Sabra Heck in 7 to 10 days upon discharge.   Patient's dressing and splint will remain in place until follow-up.  Patient is nonweightbearing on the right upper extremity until follow-up.    Thornton Park , MD 01/19/2019, 10:20 AM

## 2019-01-19 NOTE — Discharge Summary (Signed)
Physician Discharge Summary  Sheryl Suarez [id number] DOB: 03-07-1923 DOA: 01/15/2019  PCP: Leonel Ramsay, MD  Admit date: 01/15/2019 Discharge date: 01/21/2019  Admitted From: Home Disposition: SNF  Recommendations for Outpatient Follow-up:  1. Follow up with PCP in 1-2 weeks 2. Follow-up with orthopedic surgery according to your schedule appointment. 3. Please obtain BMP/CBC in one week 4. Please follow up on the following pending results: None 5. Patient is on multiple medications which can lower blood pressure, she is normotensive on discharge.  Please evaluate before giving her meds.  Home Health: No Equipment/Devices: Rollator Discharge Condition: Stable CODE STATUS: DNR Diet recommendation: Heart Healthy   Brief/Interim Summary: Sheryl Suarez a 85 y.o.femalewith medical history significant ofHTN, CAD, s/p CABG, sCHF (EF 30%), A-fib not on AC, h/o breast cancer, presented to ED after falls. Pt lives aloneand recently had multiple falls.  She had a fall close to 8 PM and unable to get herself up, daughter found her next morning.  On arrival to ED she was found to have  Acute distracted fracture of the right olecranon process of the proximal ulna and some hemarthrosis.  Chest x-ray with mildly displaced fractures of anterolateral aspect of right second and third ribs.  She was taken to the OR by orthopedic for ORIF of right elbow.  Patient tolerated the procedure well.  Her postop recovery remains unremarkable and she was discharged to SNF for rehab before going back home as she lives alone.  Rib fracture will be managed conservatively.  Pain was well controlled on discharge. She was also found to have rhabdomyolysis with elevated CK after staying on floor for long time.  CK improved with hydration.  She was also found to have mildly elevated troponin most likely demand.  Denies any chest pain or shortness of breath.  Cardiology was consulted due to her history of A.  fib, CAD and systolic heart failure with ejection fraction of 30%.  There advise continuation of home meds which includes diltiazem, metoprolol, isosorbide.  Bumex was held initially due to rhabdomyolysis and can be resumed on discharge.  Patient is not a candidate for anticoagulation due to frequent falls.    Patient is on multiple medications which can lower her blood pressure, we did held couple of them intermittently because of softer blood pressure.  Please evaluate before giving her meds and can hold if needed.  Discharge Diagnoses:  Principal Problem:   Closed fracture dislocation of right elbow Active Problems:   Closed rib fracture   HTN (hypertension), benign   Chronic systolic CHF (congestive heart failure) (HCC)   CAD (coronary artery disease)   Atrial fibrillation, chronic (HCC)   Rhabdomyolysis   Closed fracture of right olecranon process   Fall   Generalized weakness  Discharge Instructions  Discharge Instructions    Diet - low sodium heart healthy   Complete by: As directed    Discharge instructions   Complete by: As directed    It was pleasure taking care of you. We are sending you to a rehab facility with the hope that you will go home soon. Continue doing your exercises and work with physical therapist. Dennis Bast can continue taking all of your home medications.   Increase activity slowly   Complete by: As directed      Allergies as of 01/21/2019   No Known Allergies     Medication List    TAKE these medications   acetaminophen 325 MG tablet Commonly known as: TYLENOL Take  2 tablets (650 mg total) by mouth every 6 (six) hours as needed for mild pain or headache.   aspirin EC 81 MG tablet Take 81 mg by mouth daily.   bisacodyl 10 MG suppository Commonly known as: DULCOLAX Place 1 suppository (10 mg total) rectally daily as needed for moderate constipation.   bumetanide 2 MG tablet Commonly known as: BUMEX Take 2 mg by mouth daily.   Calcium 600+D  600-400 MG-UNIT tablet Generic drug: Calcium Carbonate-Vitamin D Take 1 tablet by mouth 2 (two) times daily.   Combivent Respimat 20-100 MCG/ACT Aers respimat Generic drug: Ipratropium-Albuterol Inhale 2 puffs into the lungs 4 (four) times daily as needed.   diltiazem 120 MG 24 hr capsule Commonly known as: CARDIZEM CD Take 120 mg by mouth daily.   docusate sodium 100 MG capsule Commonly known as: COLACE Take 1 capsule (100 mg total) by mouth 2 (two) times daily.   isosorbide mononitrate 30 MG 24 hr tablet Commonly known as: IMDUR Take 30 mg by mouth daily.   lovastatin 40 MG tablet Commonly known as: MEVACOR Take 40 mg by mouth at bedtime.   magnesium hydroxide 400 MG/5ML suspension Commonly known as: MILK OF MAGNESIA Take 30 mLs by mouth daily as needed for mild constipation.   metoprolol tartrate 100 MG tablet Commonly known as: LOPRESSOR Take 100 mg by mouth 2 (two) times daily.            Durable Medical Equipment  (From admission, onward)         Start     Ordered   01/16/19 1534  DME Walker rolling  Once    Comments: Platform walker right  Question:  Patient needs a walker to treat with the following condition  Answer:  Olecranon fracture, right, closed, initial encounter   01/16/19 1533         Contact information for after-discharge care    Destination    [facility]erred [facility] .   Service: Skilled Nursing Contact information: [address] [place] [phone]             No Known Allergies  Consultations:  Orthopedic  Cardiology  Procedures/Studies: Dg Chest 2 View  Result Date: 01/15/2019 CLINICAL DATA:  Multiple trauma secondary to a fall last night. Bruising to the right side of the chest and right shoulder. EXAM: CHEST - 2 VIEW COMPARISON:  None. FINDINGS: There are fractures of the anterolateral aspects of the right second and third ribs. No other acute bone abnormality. No  pneumothorax or lung contusion or pleural effusion. Heart size and pulmonary vascularity are normal. Aortic atherosclerosis. No infiltrates. Chronic degenerative changes of the right shoulder consistent with chronic rotator cuff tear. IMPRESSION: Fractures of the anterolateral aspects of the right second and third ribs. No pneumothorax or pleural effusion. Aortic Atherosclerosis (ICD10-I70.0). Electronically Signed   By: Lorriane Shire M.D.   On: 01/15/2019 11:26   Dg Shoulder Right  Result Date: 01/15/2019 CLINICAL DATA:  Right shoulder pain and bruising secondary to a fall last night. EXAM: RIGHT SHOULDER - 2+ VIEW COMPARISON:  None. FINDINGS: There are slightly displaced fractures of the anterolateral aspects of the right second and third ribs. There is no fracture or dislocation of the right shoulder. Narrowing of the subacromial space consistent with chronic rotator cuff tear. Osteopenia. IMPRESSION: 1. Slightly displaced fractures of the anterolateral aspects of the right second and third ribs. 2. Chronic rotator cuff tear. 3. No acute abnormality of  the right shoulder. Electronically Signed   By: Lorriane Shire M.D.   On: 01/15/2019 11:28   Dg Elbow 2 Views Right  Result Date: 01/16/2019 CLINICAL DATA:  Olecranon ORIF. EXAM: RIGHT ELBOW - 2 VIEW COMPARISON:  Right elbow x-rays from yesterday. FINDINGS: Interval ORIF of the olecranon fracture with 2 K-wires and a cerclage wire. Alignment is normal. Small amount of expected intra-articular air and posterior subcutaneous emphysema. Osteopenia. IMPRESSION: Interval olecranon ORIF, now in normal anatomic alignment. Electronically Signed   By: Titus Dubin M.D.   On: 01/16/2019 15:12   Dg Elbow 2 Views Right  Result Date: 01/15/2019 CLINICAL DATA:  Right elbow pain secondary to a fall last night. EXAM: RIGHT ELBOW - 2 VIEW COMPARISON:  None. FINDINGS: There is a distracted fracture of the olecranon process of the proximal ulna. Hemarthrosis.  Distal humerus and proximal radius are intact. IMPRESSION: 1. Acute distracted fracture of the olecranon process of the proximal ulna. 2. Hemarthrosis. Electronically Signed   By: Lorriane Shire M.D.   On: 01/15/2019 11:27   Ct Head Wo Contrast  Result Date: 01/15/2019 CLINICAL DATA:  Multiple falls, head trauma EXAM: CT HEAD WITHOUT CONTRAST TECHNIQUE: Contiguous axial images were obtained from the base of the skull through the vertex without intravenous contrast. COMPARISON:  None. FINDINGS: Brain: No evidence of acute infarction, hemorrhage, hydrocephalus, extra-axial collection or mass lesion/mass effect. Scattered low-density changes within the periventricular and subcortical white matter compatible with chronic microvascular ischemic change. Mild diffuse cerebral volume loss. Vascular: Mild atherosclerotic calcifications involving the large vessels of the skull base. No unexpected hyperdense vessel. Skull: Normal. Negative for fracture or focal lesion. Sinuses/Orbits: No acute finding. Other: None. IMPRESSION: 1.  No acute intracranial findings. 2.  Chronic microvascular ischemic change and cerebral volume loss. Electronically Signed   By: Davina Poke M.D.   On: 01/15/2019 10:53   Ct Cervical Spine Wo Contrast  Result Date: 01/15/2019 CLINICAL DATA:  Neck pain. Multiple falls EXAM: CT CERVICAL SPINE WITHOUT CONTRAST TECHNIQUE: Multidetector CT imaging of the cervical spine was performed without intravenous contrast. Multiplanar CT image reconstructions were also generated. COMPARISON:  None. FINDINGS: Alignment: Straightening of the cervical lordosis. Facet joints are aligned. No traumatic listhesis. Skull base and vertebrae: No acute fracture. No primary bone lesion or focal pathologic process. Soft tissues and spinal canal: No prevertebral fluid or swelling. No visible canal hematoma. Disc levels: Marked degenerative changes at the atlantoaxial articulation with prominent subchondral cysts  versus erosions and large soft tissue pannus posterior to the dens resulting in posterior displacement and mass effect on the thecal sac (series 3, image 14; series 6, image 22). Multilevel intervertebral disc height loss, severe at the C4 through C7 levels. Multilevel facet and uncovertebral arthropathy. There is bony ankylosis of the left facet joints at C2-3 and C3-4. At least moderate canal stenosis at the C5-6 level. Severe multilevel foraminal stenosis, most pronounced at C3-4 and C4-5 on the left. Upper chest: Trace left pleural effusion. Other: None. IMPRESSION: 1. No acute cervical spine fracture or posttraumatic subluxation. 2. Marked chronic degenerative changes at the atlantoaxial articulation with prominent subchondral cysts versus erosions and large soft tissue pannus posterior to the dens resulting in posterior displacement and mass effect on the thecal sac. 3. Multilevel degenerative disc and facet arthropathy. 4. Severe multilevel foraminal stenosis, most pronounced at C3-4 and C4-5 on the left. 5. Trace left pleural effusion. Electronically Signed   By: Davina Poke M.D.   On: 01/15/2019 11:02  [place] Or Nerve Block-image Only ([facility])  Result Date: 01/16/2019 There is no interpretation for this exam.  This order is for images obtained during a surgical procedure.  Please See "Surgeries" Tab for more information regarding the procedure.   Subjective: Patient was feeling better when seen this morning.  She denies any pain.  She was working with PT.  Discharge Exam: Vitals:   01/21/19 0007 01/21/19 0829  BP: 125/68 (!) 125/59  Pulse: 87 (!) 108  Resp:  18  Temp: 98.4 F (36.9 C) 98.1 F (36.7 C)  SpO2: 95% 97%   Vitals:   01/20/19 2016 01/20/19 2112 01/21/19 0007 01/21/19 0829  BP: (!) 106/55 125/68 125/68 (!) 125/59  Pulse: 79 91 87 (!) 108  Resp:    18  Temp:   98.4 F (36.9 C) 98.1 F (36.7 C)  TempSrc:    Oral  SpO2: 94%  95% 97%  Weight:      Height:         General: Pt is alert, awake, not in acute distress Cardiovascular: Irregularly irregular, S1/S2 +, no rubs, no gallops Respiratory: CTA bilaterally, no wheezing, no rhonchi, multiple healing ecchymosis on chest. Abdominal: Soft, NT, ND, bowel sounds + Extremities: no edema, no cyanosis, right arm in sling.   The results of significant diagnostics from this hospitalization (including imaging, microbiology, ancillary and laboratory) are listed below for reference.     Microbiology: Recent Results (from the past 240 hour(s))  SARS CORONAVIRUS 2 (TAT 6-24 HRS) Nasopharyngeal Nasopharyngeal Swab     Status: None   Collection Time: 01/15/19  1:37 PM   Specimen: Nasopharyngeal Swab  Result Value Ref Range Status   SARS Coronavirus 2 NEGATIVE NEGATIVE Final    Comment: (NOTE) SARS-CoV-2 target nucleic acids are NOT DETECTED. The SARS-CoV-2 RNA is generally detectable in upper and lower respiratory specimens during the acute phase of infection. Negative results do not preclude SARS-CoV-2 infection, do not rule out co-infections with other pathogens, and should not be used as the sole basis for treatment or other patient management decisions. Negative results must be combined with clinical observations, patient history, and epidemiological information. The expected result is Negative. Fact Sheet for Patients: SugarRoll.be Fact Sheet for Healthcare Providers: https://www.woods-mathews.com/ This test is not yet approved or cleared by the [place] FDA and  has been authorized for detection and/or diagnosis of SARS-CoV-2 by FDA under an Emergency Use Authorization (EUA). This EUA will remain  in effect (meaning this test can be used) for the duration of the COVID-19 declaration under Section 56 4(b)(1) of the Act, 21 U.S.C. section 360bbb-3(b)(1), unless the authorization is terminated or revoked sooner. Performed at [facility],  [place] [address] [place], [place] [address]   Surgical PCR screen     Status: None   Collection Time: 01/16/19  5:00 AM   Specimen: Nasal Mucosa; Nasal Swab  Result Value Ref Range Status   MRSA, PCR NEGATIVE NEGATIVE Final   Staphylococcus aureus NEGATIVE NEGATIVE Final    Comment: (NOTE) The Xpert SA Assay (FDA approved for NASAL specimens in patients 42 years of age and older), is one component of a comprehensive surveillance program. It is not intended to diagnose infection nor to guide or monitor treatment. Performed at [facility], [place], [place] [address]   SARS CORONAVIRUS 2 (TAT 6-24 HRS) Nasopharyngeal Nasopharyngeal Swab     Status: None   Collection Time: 01/19/19  4:46 PM   Specimen: Nasopharyngeal  Swab  Result Value Ref Range Status   SARS Coronavirus 2 NEGATIVE NEGATIVE Final    Comment: (NOTE) SARS-CoV-2 target nucleic acids are NOT DETECTED. The SARS-CoV-2 RNA is generally detectable in upper and lower respiratory specimens during the acute phase of infection. Negative results do not preclude SARS-CoV-2 infection, do not rule out co-infections with other pathogens, and should not be used as the sole basis for treatment or other patient management decisions. Negative results must be combined with clinical observations, patient history, and epidemiological information. The expected result is Negative. Fact Sheet for Patients: SugarRoll.be Fact Sheet for Healthcare Providers: https://www.woods-mathews.com/ This test is not yet approved or cleared by the [place] FDA and  has been authorized for detection and/or diagnosis of SARS-CoV-2 by FDA under an Emergency Use Authorization (EUA). This EUA will remain  in effect (meaning this test can be used) for the duration of the COVID-19 declaration under Section 56 4(b)(1) of the Act, 21 U.S.C. section 360bbb-3(b)(1), unless the authorization is  terminated or revoked sooner. Performed at [facility], [place] [address] [place], [place] [address]      Labs: BNP (last 3 results) No results for input(s): BNP in the last 8760 hours. Basic Metabolic Panel: Recent Labs  Lab 01/15/19 1149 01/16/19 0350 01/17/19 0353 01/18/19 0519 01/19/19 0403  NA 141 141 138 136 134*  K 4.2 3.9 4.1 4.1 4.2  CL 105 108 106 104 104  CO2 24 25 21* 24 25  GLUCOSE 99 86 74 103* 97  BUN 42* 44* 40* 40* 31*  CREATININE 0.98 0.94 1.00 0.89 0.83  CALCIUM 9.7 9.3 9.0 9.1 9.2   Liver Function Tests: Recent Labs  Lab 01/15/19 1149 01/17/19 0353  AST 65* 41  ALT 34 28  ALKPHOS 119 101  BILITOT 1.8* 1.5*  PROT 7.2 6.2*  ALBUMIN 4.0 3.3*   No results for input(s): LIPASE, AMYLASE in the last 168 hours. No results for input(s): AMMONIA in the last 168 hours. CBC: Recent Labs  Lab 01/15/19 1006 01/16/19 0350 01/17/19 0353 01/18/19 1222  WBC 9.0 6.1 7.9  --   NEUTROABS 7.3  --   --   --   HGB 11.5* 9.7* 10.5*  --   HCT 37.4 31.3* 35.0* 32.0*  MCV 100.0 99.1 103.2*  --   PLT 108* 109* 114*  --    Cardiac Enzymes: Recent Labs  Lab 01/15/19 1149 01/16/19 0350 01/17/19 0353  CKTOTAL 1,926* 790* 358*   BNP: Invalid input(s): POCBNP CBG: No results for input(s): GLUCAP in the last 168 hours. D-Dimer No results for input(s): DDIMER in the last 72 hours. Hgb A1c No results for input(s): HGBA1C in the last 72 hours. Lipid Profile No results for input(s): CHOL, HDL, LDLCALC, TRIG, CHOLHDL, LDLDIRECT in the last 72 hours. Thyroid function studies No results for input(s): TSH, T4TOTAL, T3FREE, THYROIDAB in the last 72 hours.  Invalid input(s): FREET3 Anemia work up No results for input(s): VITAMINB12, FOLATE, FERRITIN, TIBC, IRON, RETICCTPCT in the last 72 hours. Urinalysis    Component Value Date/Time   COLORURINE YELLOW (A) 01/15/2019 1215   APPEARANCEUR HAZY (A) 01/15/2019 1215   LABSPEC 1.021 01/15/2019 1215   PHURINE  5.0 01/15/2019 1215   GLUCOSEU NEGATIVE 01/15/2019 1215   HGBUR NEGATIVE 01/15/2019 1215   BILIRUBINUR NEGATIVE 01/15/2019 1215   KETONESUR 5 (A) 01/15/2019 1215   PROTEINUR 30 (A) 01/15/2019 1215   NITRITE NEGATIVE 01/15/2019 1215   LEUKOCYTESUR NEGATIVE 01/15/2019 1215  Sepsis Labs Invalid input(s): PROCALCITONIN,  WBC,  LACTICIDVEN Microbiology Recent Results (from the past 240 hour(s))  SARS CORONAVIRUS 2 (TAT 6-24 HRS) Nasopharyngeal Nasopharyngeal Swab     Status: None   Collection Time: 01/15/19  1:37 PM   Specimen: Nasopharyngeal Swab  Result Value Ref Range Status   SARS Coronavirus 2 NEGATIVE NEGATIVE Final    Comment: (NOTE) SARS-CoV-2 target nucleic acids are NOT DETECTED. The SARS-CoV-2 RNA is generally detectable in upper and lower respiratory specimens during the acute phase of infection. Negative results do not preclude SARS-CoV-2 infection, do not rule out co-infections with other pathogens, and should not be used as the sole basis for treatment or other patient management decisions. Negative results must be combined with clinical observations, patient history, and epidemiological information. The expected result is Negative. Fact Sheet for Patients: SugarRoll.be Fact Sheet for Healthcare Providers: https://www.woods-mathews.com/ This test is not yet approved or cleared by the [place] FDA and  has been authorized for detection and/or diagnosis of SARS-CoV-2 by FDA under an Emergency Use Authorization (EUA). This EUA will remain  in effect (meaning this test can be used) for the duration of the COVID-19 declaration under Section 56 4(b)(1) of the Act, 21 U.S.C. section 360bbb-3(b)(1), unless the authorization is terminated or revoked sooner. Performed at [facility], [place] [address], [place], [place] [address]   Surgical PCR screen     Status: None   Collection Time: 01/16/19  5:00 AM   Specimen: Nasal  Mucosa; Nasal Swab  Result Value Ref Range Status   MRSA, PCR NEGATIVE NEGATIVE Final   Staphylococcus aureus NEGATIVE NEGATIVE Final    Comment: (NOTE) The Xpert SA Assay (FDA approved for NASAL specimens in patients 32 years of age and older), is one component of a comprehensive surveillance program. It is not intended to diagnose infection nor to guide or monitor treatment. Performed at [facility], [place], [place] [address]   SARS CORONAVIRUS 2 (TAT 6-24 HRS) Nasopharyngeal Nasopharyngeal Swab     Status: None   Collection Time: 01/19/19  4:46 PM   Specimen: Nasopharyngeal Swab  Result Value Ref Range Status   SARS Coronavirus 2 NEGATIVE NEGATIVE Final    Comment: (NOTE) SARS-CoV-2 target nucleic acids are NOT DETECTED. The SARS-CoV-2 RNA is generally detectable in upper and lower respiratory specimens during the acute phase of infection. Negative results do not preclude SARS-CoV-2 infection, do not rule out co-infections with other pathogens, and should not be used as the sole basis for treatment or other patient management decisions. Negative results must be combined with clinical observations, patient history, and epidemiological information. The expected result is Negative. Fact Sheet for Patients: SugarRoll.be Fact Sheet for Healthcare Providers: https://www.woods-mathews.com/ This test is not yet approved or cleared by the [place] FDA and  has been authorized for detection and/or diagnosis of SARS-CoV-2 by FDA under an Emergency Use Authorization (EUA). This EUA will remain  in effect (meaning this test can be used) for the duration of the COVID-19 declaration under Section 56 4(b)(1) of the Act, 21 U.S.C. section 360bbb-3(b)(1), unless the authorization is terminated or revoked sooner. Performed at [facility], [place] [address], [place], [place] [address]     Time coordinating  discharge: Over 30 minutes  SIGNED:  Lorella Nimrod, MD  [facility] 01/21/2019, 9:45 AM Pager [phone]  If 7PM-7AM, please contact night-coverage [email or web address] Password [email or web address]  This record has been created using Systems analyst. Errors  have been sought and corrected,but may not always be located. Such creation errors do not reflect on the standard of care.

## 2019-01-19 NOTE — Progress Notes (Signed)
Physical Therapy Treatment Patient Details Name: Sheryl Suarez MRN: [id number] DOB: 1923/02/25 Today's Date: 01/19/2019    History of Present Illness Sheryl Suarez is a 85 y.o. female with medical history significant of HTN, CAD, s/p CABG, sCHF (EF 30%), A-fib not on [facility], h/o breast cancer, presented to ED on 01/15/2019 after multiple falls. She laid on the floor overnight before being found by her daughter. Radiograph showed acute distracted fracture of the olecranon process of proximal ulna. She was admitted to the hospital for closed fracture dislocation of R elbow and closed 2nd and third rib fracture, rhabdomyolysis, and elevated troponin likely related to rhabdo. She underwent ORIF to the R elbow 01/16/2019. NWB R UE per Dr. Donney Rankins    PT Comments    Pt with good carry over and increased independence of transfers, minA for trunk negotiation with supine > sit; minA for STS initiation and to remain standing. Remains hesitant of quad cane, but is able to utilize with proper sequencing following cuing, continued difficulty with WB through QC in a way that is helpful to maintain balance, but more comfortable overall. ModA > occassional max/total to remain balance trhough FWD/BWD ambulation 32ft. Current recommendations remain appropriate.    Follow Up Recommendations  SNF;Supervision for mobility/OOB     Equipment Recommendations  Other (comment);3in1 (PT)    Recommendations for Other Services OT consult     Precautions / Restrictions Precautions Precautions: Fall Restrictions Weight Bearing Restrictions: Yes RUE Weight Bearing: Non weight bearing(NWB RUE) Other Position/Activity Restrictions: B LE WBAT    Mobility  Bed Mobility Overal bed mobility: Needs Assistance Bed Mobility: Supine to Sit     Supine to sit: Max assist;Mod assist     General bed mobility comments: Patient able to initiate transfer, needs support for trunk negotiation as she cannot push herself from her  LUE to EOB sitting  Transfers Overall transfer level: Needs assistance Equipment used: None Transfers: Sit to/from Stand Sit to Stand: Min assist         General transfer comment: Patient able so stand with minA needed for initiation, minA needed to establish balance following transfer  Ambulation/Gait Ambulation/Gait assistance: Mod assist Gait Distance (Feet): 10 Feet Assistive device: Quad cane   Gait velocity: extremely slow   General Gait Details: ModA for trunk- heavy post lean, very slow in general, LUE on quad cane, 7ft FWD, 32ft retroAMB,   Stairs             Wheelchair Mobility    Modified Rankin (Stroke Patients Only)       Balance Overall balance assessment: Needs assistance Sitting-balance support: Single extremity supported;Feet supported;Feet unsupported Sitting balance-Leahy Scale: Fair Sitting balance - Comments: initially unable to maintain trunk control without LE support, improved to moderately steady sitting on edge of bed but could not tolerate challenge. Postural control: Posterior lean Standing balance support: Single extremity supported Standing balance-Leahy Scale: Poor                              Cognition Arousal/Alertness: Awake/alert Behavior During Therapy: WFL for tasks assessed/performed Overall Cognitive Status: Within Functional Limits for tasks assessed                                 General Comments: Patient had difficulty following directions for use of AD and to gain balance. Oriented to self and situation,  thought it was Wednesday in [place]. Oriented her that it is thanksgiving.      Exercises Other Exercises Other Exercises: supine to sit modA for trunk negotiation, able to remain sitting x54mins with LUE support Other Exercises: STS from bed min gaurd with minA needed to establish balance once standing. Able to place LUE on chair arm and take steps, moving to far chair arm to take steps  to sit with minA. STS from chair minA for initiation and to establish balance Other Exercises: FWD/RetroAMB 7ft each with LUE on quad cane: Mod-TotalA for balance from author with patioent hesitant, but able to comply with cuing for use of quad cane, keeping it in front, good sequencing, difficulty WB into QC in a way that is helpful to balance.    General Comments        Pertinent Vitals/Pain Pain Assessment: No/denies pain    Home Living                      Prior Function            PT Goals (current goals can now be found in the care plan section) Acute Rehab PT Goals Patient Stated Goal: return home PT Goal Formulation: With patient Time For Goal Achievement: 01/31/19 Potential to Achieve Goals: Fair Progress towards PT goals: Progressing toward goals    Frequency    BID      PT Plan Current plan remains appropriate    Co-evaluation              AM-PAC PT "6 Clicks" Mobility   Outcome Measure  Help needed turning from your back to your side while in a flat bed without using bedrails?: A Lot Help needed moving from lying on your back to sitting on the side of a flat bed without using bedrails?: A Lot Help needed moving to and from a bed to a chair (including a wheelchair)?: A Lot Help needed standing up from a chair using your arms (e.g., wheelchair or bedside chair)?: A Lot Help needed to walk in hospital room?: Total   6 Click Score: 9    End of Session Equipment Utilized During Treatment: Gait belt Activity Tolerance: Patient tolerated treatment well Patient left: in chair;with call bell/phone within reach;with chair alarm set;with SCD's reapplied   PT Visit Diagnosis: Unsteadiness on feet (R26.81);Other abnormalities of gait and mobility (R26.89);Repeated falls (R29.6);Muscle weakness (generalized) (M62.81)     Time:  -     Charges:                        Shelton Silvas PT, DPT   Shelton Silvas 01/19/2019, 9:56 AM

## 2019-01-19 NOTE — Progress Notes (Signed)
Physical Therapy Treatment Patient Details Name: Sheryl Suarez MRN: [id number] DOB: 1923/04/16 Today's Date: 01/19/2019    History of Present Illness Sheryl Suarez is a 85 y.o. female with medical history significant of HTN, CAD, s/p CABG, sCHF (EF 30%), A-fib not on [facility], h/o breast cancer, presented to ED on 01/15/2019 after multiple falls. She laid on the floor overnight before being found by her daughter. Radiograph showed acute distracted fracture of the olecranon process of proximal ulna. She was admitted to the hospital for closed fracture dislocation of R elbow and closed 2nd and third rib fracture, rhabdomyolysis, and elevated troponin likely related to rhabdo. She underwent ORIF to the R elbow 01/16/2019. NWB R UE per Dr. Donney Rankins    PT Comments    PT walking by room as patient light is on requesting to use bathroom. PT assisted patient in chair >BSC stand pivot transfer modA with patient able to wt shift to the L with LUE reaching to bedside commode and swivel hips well. Patient is able to stand x2 attempts minA with minA needed to maintain standing for PT assisted cleaning. Patient with increased difficulty with R wt shifting back toward chair, d/t being unable to reach with RUE toward chair. Pt nervous throughout transfer and ultimately unable to maintain balance, requiring maxA back to recliner. Pt reports she is so fatigued following toileting she cannot attempt more activity. Would benefit from skilled PT to address above deficits and promote optimal return to PLOF.   Follow Up Recommendations  SNF;Supervision for mobility/OOB     Equipment Recommendations  Other (comment);3in1 (PT)    Recommendations for Other Services OT consult     Precautions / Restrictions Precautions Precautions: Fall Restrictions Weight Bearing Restrictions: Yes RUE Weight Bearing: Non weight bearing Other Position/Activity Restrictions: B LE WBAT    Mobility  Bed Mobility                   Transfers Overall transfer level: Needs assistance Equipment used: None Transfers: Stand Pivot Transfers   Stand pivot transfers: Mod assist;Max assist       General transfer comment: Chair to [facility] (commode on L) modA: able to comply with cuing for hand placement to wt bear through LUE and turn hips from chair to [facility]. BSC > chair maxA d/t unable to reach with LUE, maxA at trunk and LUE for mini stand with shuffle steps backward  Ambulation/Gait                 Stairs             Wheelchair Mobility    Modified Rankin (Stroke Patients Only)       Balance                                            Cognition Arousal/Alertness: Awake/alert Behavior During Therapy: WFL for tasks assessed/performed Overall Cognitive Status: Within Functional Limits for tasks assessed                                 General Comments: Patient had difficulty following directions for use of AD and to gain balance. Oriented to self and situation, thought it was Wednesday in [place]. Oriented her that it is thanksgiving.      Exercises Other Exercises Other Exercises: Stand pivot  from chair to BSC(BSC on L) patient able to assist in trasnfer reaching with LUE and swivling hips toward chair, needing modA. Able to complete STS transfer minA, minA to remain standing for cleaning. Increased difficulty BSC to chair d/t difficulty with R wt shifting when pt cannot feel enviornemnt d/t WB restrictions, patient nervous through transfer maxA    General Comments        Pertinent Vitals/Pain Pain Assessment: No/denies pain Pain Location: R arm Pain Descriptors / Indicators: Aching;Sore    Home Living                      Prior Function            PT Goals (current goals can now be found in the care plan section) Acute Rehab PT Goals Patient Stated Goal: return home PT Goal Formulation: With patient Potential to Achieve Goals:  Fair Progress towards PT goals: Progressing toward goals    Frequency    BID      PT Plan Current plan remains appropriate    Co-evaluation              AM-PAC PT "6 Clicks" Mobility   Outcome Measure  Help needed turning from your back to your side while in a flat bed without using bedrails?: A Lot Help needed moving from lying on your back to sitting on the side of a flat bed without using bedrails?: A Lot Help needed moving to and from a bed to a chair (including a wheelchair)?: A Lot Help needed standing up from a chair using your arms (e.g., wheelchair or bedside chair)?: A Lot Help needed to walk in hospital room?: Total Help needed climbing 3-5 steps with a railing? : Total 6 Click Score: 10    End of Session Equipment Utilized During Treatment: Gait belt Activity Tolerance: Patient tolerated treatment well Patient left: in chair;with call bell/phone within reach;with chair alarm set;with SCD's reapplied Nurse Communication: Mobility status;Weight bearing status;Precautions PT Visit Diagnosis: Unsteadiness on feet (R26.81);Other abnormalities of gait and mobility (R26.89);Repeated falls (R29.6);Muscle weakness (generalized) (M62.81)     Time: [id number] PT Time Calculation (min) (ACUTE ONLY): 12 min  Charges:  $Therapeutic Activity: 8-22 mins                     Shelton Silvas PT, DPT  Shelton Silvas 01/19/2019, 4:14 PM

## 2019-01-19 NOTE — TOC Progression Note (Signed)
Transition of Care [facility]) - Progression Note    Patient Details  Name: TARIANNA MARZE MRN: [id number] Date of Birth: 02-26-1923  Transition of Care [facility]) CM/SW Contact  Eileen Stanford, LCSW Phone Number: 01/19/2019, 2:07 PM  Clinical Narrative:  [name] unable to take due to lack of bed availability. Pt has chosen [facility]-CSW has inquired about bed availability.     Expected Discharge Plan: [place] Barriers to Discharge: Continued Medical Work up  Expected Discharge Plan and Services Expected Discharge Plan: [place]   Discharge Planning Services: CM Consult   Living arrangements for the past 2 months: Single Family Home(townhome)                                       Social Determinants of Health (SDOH) Interventions    Readmission Risk Interventions No flowsheet data found.

## 2019-01-20 LAB — SARS CORONAVIRUS 2 (TAT 6-24 HRS): SARS Coronavirus 2: NEGATIVE

## 2019-01-20 LAB — FOLATE RBC
Folate, Hemolysate: >620 ng/mL
Folate, RBC: >1938 ng/mL (ref >498)
Hematocrit: 32 % — ABNORMAL LOW (ref 34.0–46.6)

## 2019-01-20 MED ORDER — ACETAMINOPHEN 325 MG PO TABS
650.0000 mg | ORAL_TABLET | Freq: Four times a day (QID) | ORAL | Status: DC | PRN
  Administered 2019-01-20: 650 mg via ORAL
  Filled 2019-01-20: qty 2

## 2019-01-20 NOTE — Progress Notes (Signed)
PROGRESS NOTE    Sheryl Suarez  [id number] DOB: 09-08-1923 DOA: 01/15/2019 PCP: Leonel Ramsay, MD   Brief Narrative:  Sheryl Suarez is a 85 y.o. female with medical history significant of HTN, CAD, s/p CABG, sCHF (EF 30%), A-fib not on [facility], h/o breast cancer, presented to ED after falls. Pt lives alone and recently had multiple falls. She fell at home yesterday afternoon and was assisted up.  She fell again at night round 8pm, not able to get up. She lied on the floor until this morning her daughter visited her.  She had large bruise on her left chest and shoulder.  Images of left elbow shows Acute distracted fracture of the olecranon process of the proximal ulna and some hemarthrosis.  CT head and C-spine was without any acute abnormality.  Chest x-ray and right shoulder x-ray shows mildly displaced fractures of anterolateral aspects of right second and third ribs. Patient was taken to the OR  by orthopedic for ORIF of right elbow.  Subjective: Patient is feeling better when seen this morning.  She had a bed offer at [facility] for tomorrow.  Assessment & Plan:   Principal Problem:   Closed fracture dislocation of right elbow Active Problems:   Closed rib fracture   HTN (hypertension), benign   Chronic systolic CHF (congestive heart failure) (HCC)   CAD (coronary artery disease)   Atrial fibrillation, chronic (HCC)   Rhabdomyolysis   Closed fracture of right olecranon process   Fall   Generalized weakness  Acute distracted fracture of the olecranon process of the proximal ulna. Patient was taken to the OR for ORIF of right elbow by orthopedic. -PT/OT are recommending SNF placement for rehab. -She had a bed of her at [facility] for tomorrow. -Continue pain management. -Covid testing negative.  2nd and 3rd rib fracture. -Continue with incentive spirometry and pain management.  Rhabdomyolysis.  Most likely after remained on floor for long time. CK improving.  -Encourage increase p.o. intake and IV fluids with caution as patient has systolic heart failure.  Macrocytic anemia.  B12 within normal range.  RBC folate pending.  Elevated troponin.  Most likely demand.  Denies any chest pain or shortness of breath.  HTN, CAD, s/p CABG, sCHF (EF 30%), A-fib not on [facility].  Cardiology evaluated her for surgical clearance.  Advising continuation of home diltiazem, metoprolol, isosorbide.  Normotensive today. -Hold Bumex due to rhabdomyolysis. -Monitor volume status closely. -Not a candidate for anticoagulation due to frequent falls.  Objective: Vitals:   01/19/19 2015 01/19/19 2336 01/20/19 0932 01/20/19 1000  BP: 130/61 (!) 125/54 (!) 110/51   Pulse: 84 64 (!) 102 95  Resp:  17 18   Temp:  98.2 F (36.8 C) 98.6 F (37 C)   TempSrc:  Oral    SpO2: 97% 94% 96%   Weight:      Height:        Intake/Output Summary (Last 24 hours) at 01/20/2019 1336 Last data filed at 01/19/2019 1700 Gross per 24 hour  Intake 120 ml  Output -  Net 120 ml   Filed Weights   01/16/19 1054  Weight: 47.6 kg   Examination:  General exam: Appears calm and comfortable  Respiratory system: Clear to auscultation. Respiratory effort normal. Cardiovascular system: Irregularly irregular, no JVD, murmurs, rubs, gallops or clicks. No pedal edema. Gastrointestinal system: Abdomen is nondistended, soft and nontender. No organomegaly or masses felt. Normal bowel sounds heard. Central nervous system: Alert and oriented.  No focal neurological deficits. Extremities: Right arm in sling, edematous hand, pulses intact. Skin: Multiple ecchymoses involving right shoulder and chest, seems improving. Psychiatry: Judgement and insight appear normal. Mood & affect appropriate.   DVT prophylaxis: SCDs Code Status: DNR Family Communication: No family at bedside. Disposition Plan: Pending SNF bed availability, most likely tomorrow.  Consultants:   Orthopedic  Cardiology   Procedures:  ORIF of right elbow.  Antimicrobials:  Cefazolin and clindamycin for surgery.  Data Reviewed: I have personally reviewed following labs and imaging studies  CBC: Recent Labs  Lab 01/15/19 1006 01/16/19 0350 01/17/19 0353 01/18/19 1222  WBC 9.0 6.1 7.9  --   NEUTROABS 7.3  --   --   --   HGB 11.5* 9.7* 10.5*  --   HCT 37.4 31.3* 35.0* 32.0*  MCV 100.0 99.1 103.2*  --   PLT 108* 109* 114*  --    Basic Metabolic Panel: Recent Labs  Lab 01/15/19 1149 01/16/19 0350 01/17/19 0353 01/18/19 0519 01/19/19 0403  NA 141 141 138 136 134*  K 4.2 3.9 4.1 4.1 4.2  CL 105 108 106 104 104  CO2 24 25 21* 24 25  GLUCOSE 99 86 74 103* 97  BUN 42* 44* 40* 40* 31*  CREATININE 0.98 0.94 1.00 0.89 0.83  CALCIUM 9.7 9.3 9.0 9.1 9.2   GFR: Estimated Creatinine Clearance: 31.8 mL/min (by C-G formula based on SCr of 0.83 mg/dL). Liver Function Tests: Recent Labs  Lab 01/15/19 1149 01/17/19 0353  AST 65* 41  ALT 34 28  ALKPHOS 119 101  BILITOT 1.8* 1.5*  PROT 7.2 6.2*  ALBUMIN 4.0 3.3*   No results for input(s): LIPASE, AMYLASE in the last 168 hours. No results for input(s): AMMONIA in the last 168 hours. Coagulation Profile: Recent Labs  Lab 01/15/19 1854 01/16/19 0350  INR 1.3* 1.3*   Cardiac Enzymes: Recent Labs  Lab 01/15/19 1149 01/16/19 0350 01/17/19 0353  CKTOTAL 1,926* 790* 358*   BNP (last 3 results) No results for input(s): PROBNP in the last 8760 hours. HbA1C: No results for input(s): HGBA1C in the last 72 hours. CBG: No results for input(s): GLUCAP in the last 168 hours. Lipid Profile: No results for input(s): CHOL, HDL, LDLCALC, TRIG, CHOLHDL, LDLDIRECT in the last 72 hours. Thyroid Function Tests: No results for input(s): TSH, T4TOTAL, FREET4, T3FREE, THYROIDAB in the last 72 hours. Anemia Panel: No results for input(s): VITAMINB12, FOLATE, FERRITIN, TIBC, IRON, RETICCTPCT in the last 72 hours. Sepsis Labs: No results for input(s):  PROCALCITON, LATICACIDVEN in the last 168 hours.  Recent Results (from the past 240 hour(s))  SARS CORONAVIRUS 2 (TAT 6-24 HRS) Nasopharyngeal Nasopharyngeal Swab     Status: None   Collection Time: 01/15/19  1:37 PM   Specimen: Nasopharyngeal Swab  Result Value Ref Range Status   SARS Coronavirus 2 NEGATIVE NEGATIVE Final    Comment: (NOTE) SARS-CoV-2 target nucleic acids are NOT DETECTED. The SARS-CoV-2 RNA is generally detectable in upper and lower respiratory specimens during the acute phase of infection. Negative results do not preclude SARS-CoV-2 infection, do not rule out co-infections with other pathogens, and should not be used as the sole basis for treatment or other patient management decisions. Negative results must be combined with clinical observations, patient history, and epidemiological information. The expected result is Negative. Fact Sheet for Patients: SugarRoll.be Fact Sheet for Healthcare Providers: https://www.woods-mathews.com/ This test is not yet approved or cleared by the [place] and  has been authorized  for detection and/or diagnosis of SARS-CoV-2 by FDA under an Emergency Use Authorization (EUA). This EUA will remain  in effect (meaning this test can be used) for the duration of the COVID-19 declaration under Section 56 4(b)(1) of the Act, 21 U.S.C. section 360bbb-3(b)(1), unless the authorization is terminated or revoked sooner. Performed at [facility], [place] [address] [place], [place] [address]   Surgical PCR screen     Status: None   Collection Time: 01/16/19  5:00 AM   Specimen: Nasal Mucosa; Nasal Swab  Result Value Ref Range Status   MRSA, PCR NEGATIVE NEGATIVE Final   Staphylococcus aureus NEGATIVE NEGATIVE Final    Comment: (NOTE) The Xpert SA Assay (FDA approved for NASAL specimens in patients 56 years of age and older), is one component of a comprehensive surveillance program.  It is not intended to diagnose infection nor to guide or monitor treatment. Performed at [facility], [place], [place] [address]   SARS CORONAVIRUS 2 (TAT 6-24 HRS) Nasopharyngeal Nasopharyngeal Swab     Status: None   Collection Time: 01/19/19  4:46 PM   Specimen: Nasopharyngeal Swab  Result Value Ref Range Status   SARS Coronavirus 2 NEGATIVE NEGATIVE Final    Comment: (NOTE) SARS-CoV-2 target nucleic acids are NOT DETECTED. The SARS-CoV-2 RNA is generally detectable in upper and lower respiratory specimens during the acute phase of infection. Negative results do not preclude SARS-CoV-2 infection, do not rule out co-infections with other pathogens, and should not be used as the sole basis for treatment or other patient management decisions. Negative results must be combined with clinical observations, patient history, and epidemiological information. The expected result is Negative. Fact Sheet for Patients: SugarRoll.be Fact Sheet for Healthcare Providers: https://www.woods-mathews.com/ This test is not yet approved or cleared by the [place] FDA and  has been authorized for detection and/or diagnosis of SARS-CoV-2 by FDA under an Emergency Use Authorization (EUA). This EUA will remain  in effect (meaning this test can be used) for the duration of the COVID-19 declaration under Section 56 4(b)(1) of the Act, 21 U.S.C. section 360bbb-3(b)(1), unless the authorization is terminated or revoked sooner. Performed at [facility], [place] [address] [place], [place] [address]      Radiology Studies: No results found.  Scheduled Meds: . aspirin EC  81 mg Oral Daily  . calcium-vitamin D  1 tablet Oral BID  . diltiazem  120 mg Oral Daily  . docusate sodium  100 mg Oral BID  . enoxaparin (LOVENOX) injection  30 mg Subcutaneous Q24H  . isosorbide mononitrate  30 mg Oral Daily  . lidocaine  1 patch  Transdermal Q24H  . metoprolol tartrate  100 mg Oral BID  . pneumococcal 23 valent vaccine  0.5 mL Intramuscular Tomorrow-1000   Continuous Infusions: . sodium chloride 75 mL/hr at 01/17/19 1253  . lactated ringers 50 mL/hr at 01/16/19 0300  . methocarbamol (ROBAXIN) IV       LOS: 5 days   Time spent: 35 minutes.  I personally reviewed her chart and previous notes.  Lorella Nimrod, MD [facility] Pager [phone]  If 7PM-7AM, please contact night-coverage [email or web address] Password [facility] 01/20/2019, 1:36 PM   This record has been created using Systems analyst. Errors have been sought and corrected,but may not always be located. Such creation errors do not reflect on the standard of care.

## 2019-01-20 NOTE — Progress Notes (Signed)
Physical Therapy Treatment Patient Details Name: Sheryl Suarez MRN: [id number] DOB: 1923/08/02 Today's Date: 01/20/2019    History of Present Illness 85 y.o. female with medical history significant of HTN, CAD, s/p CABG, sCHF (EF 30%), A-fib not on [facility], h/o breast cancer, presented to ED on 01/15/2019 after multiple falls. She laid on the floor overnight before being found by her daughter. Radiograph showed acute distracted fracture of the olecranon process of proximal ulna. She was admitted to the hospital for closed fracture dislocation of R elbow and closed 2nd and third rib fracture, rhabdomyolysis, and elevated troponin likely related to rhabdo. She underwent ORIF to the R elbow 01/16/2019. NWB R UE per Dr. Donney Rankins    PT Comments    Pt was pleasant and willing to participate with all aspects of PT treat, but was functionally limited t/o the effort and needed assist with most tasks.  She was only able to shuffle minimally from bed to recliner and needed constant assist to keep from losing balance backwards.  Pt with little to no pain t/o the effort, but clearly frustrated with her situation and lack of mobility.  Follow Up Recommendations  SNF;Supervision for mobility/OOB     Equipment Recommendations  3in1 (PT)(hemiwalker vs quad cane)    Recommendations for Other Services       Precautions / Restrictions Precautions Precautions: Fall Restrictions RUE Weight Bearing: Non weight bearing    Mobility  Bed Mobility Overal bed mobility: Needs Assistance Bed Mobility: Supine to Sit     Supine to sit: Mod assist     General bed mobility comments: Pt showed some effort in getting to sitting, but ultimately needed   Transfers Overall transfer level: Needs assistance Equipment used: Quad cane Transfers: Sit to/from Stand Sit to Stand: Mod assist         General transfer comment: Pt again showed good effort but needed assist to rise and then to keep weight forward just to  maintain balance.  Ambulation/Gait Ambulation/Gait assistance: Mod assist Gait Distance (Feet): 5 Feet Assistive device: Quad cane       General Gait Details: Pt was unable to take actual steps, just shuffled feet with assist to turn and get to recliner.  Pt leaning backward and reliant on the QC and PT for assist to stay upright.    Stairs             Wheelchair Mobility    Modified Rankin (Stroke Patients Only)       Balance Overall balance assessment: Needs assistance Sitting-balance support: Single extremity supported;Feet supported;Feet unsupported Sitting balance-Leahy Scale: Fair     Standing balance support: Single extremity supported Standing balance-Leahy Scale: Poor Standing balance comment: capacity for establishing balance or correcting LOB is extremely limited at this time.                            Cognition Arousal/Alertness: Awake/alert Behavior During Therapy: WFL for tasks assessed/performed Overall Cognitive Status: Within Functional Limits for tasks assessed                                        Exercises General Exercises - Lower Extremity Ankle Circles/Pumps: AROM;10 reps Long Arc Quad: Strengthening;10 reps Heel Slides: Strengthening;10 reps Hip ABduction/ADduction: Strengthening;10 reps Hip Flexion/Marching: Strengthening;10 reps    General Comments  Pertinent Vitals/Pain Pain Assessment: No/denies pain Pain Score: 0-No pain Pain Location: R arm    Home Living Family/patient expects to be discharged to:: Assisted living                    Prior Function            PT Goals (current goals can now be found in the care plan section) Progress towards PT goals: Progressing toward goals    Frequency    BID      PT Plan Current plan remains appropriate    Co-evaluation              AM-PAC PT "6 Clicks" Mobility   Outcome Measure  Help needed turning from your back  to your side while in a flat bed without using bedrails?: A Lot Help needed moving from lying on your back to sitting on the side of a flat bed without using bedrails?: A Lot Help needed moving to and from a bed to a chair (including a wheelchair)?: A Lot Help needed standing up from a chair using your arms (e.g., wheelchair or bedside chair)?: A Lot Help needed to walk in hospital room?: Total Help needed climbing 3-5 steps with a railing? : Total 6 Click Score: 10    End of Session Equipment Utilized During Treatment: Gait belt Activity Tolerance: Patient tolerated treatment well Patient left: in chair;with call bell/phone within reach;with chair alarm set;with SCD's reapplied Nurse Communication: Mobility status PT Visit Diagnosis: Unsteadiness on feet (R26.81);Other abnormalities of gait and mobility (R26.89);Repeated falls (R29.6);Muscle weakness (generalized) (M62.81)     Time:  -1038    Charges:  $Therapeutic Exercise: 8-22 mins $Therapeutic Activity: 8-22 mins                     Kreg Shropshire, DPT 01/20/2019, 1:44 PM

## 2019-01-20 NOTE — Progress Notes (Signed)
PT BP 110/51. MD made aware. Orders received to hold BP meds (Cardizem and Imdur), and give metoprolol.

## 2019-01-20 NOTE — Progress Notes (Signed)
  Subjective:  POD #4 s/p ORIF of right olecranon fracture.   Patient reports right elbow pain as mild.  Patient sitting up in bed eating breakfast.  Objective:   VITALS:   Vitals:   01/19/19 0759 01/19/19 1645 01/19/19 2015 01/19/19 2336  BP: 117/73 (!) 138/54 130/61 (!) 125/54  Pulse: 95 69 84 64  Resp: 17 16  17   Temp: 98.5 F (36.9 C) 98.4 F (36.9 C)  98.2 F (36.8 C)  TempSrc: Oral Oral  Oral  SpO2: 100% 100% 97% 94%  Weight:      Height:        PHYSICAL EXAM: Right upper extremity: Extensive ecchymosis over the right chest and upper arm continues to improve.  Bandage remains clean dry and intact.  Splint in place.  Sling in place on right upper extremity.  Patient swelling in the right digits is improving.  She can flex and extend all 5 fingers and has intact sensation light touch.   LABS  Results for orders placed or performed during the hospital encounter of 01/15/19 (from the past 24 hour(s))  SARS CORONAVIRUS 2 (TAT 6-24 HRS) Nasopharyngeal Nasopharyngeal Swab     Status: None   Collection Time: 01/19/19  4:46 PM   Specimen: Nasopharyngeal Swab  Result Value Ref Range   SARS Coronavirus 2 NEGATIVE NEGATIVE    No results found.  Assessment/Plan: 4 Days Post-Op   Principal Problem:   Closed fracture dislocation of right elbow Active Problems:   Closed rib fracture   HTN (hypertension), benign   Chronic systolic CHF (congestive heart failure) (HCC)   CAD (coronary artery disease)   Atrial fibrillation, chronic (HCC)   Rhabdomyolysis   Closed fracture of right olecranon process   Fall   Generalized weakness  Patient stable postop.  Awaiting skilled nursing facility placement.  Patient is nonweightbearing on the right upper extremity.  Continue to use sling while out of bed.  Follow-up with Dr. Earnestine Leys at [facility] after discharge and 7 to 10 days.  Office phone [phone].    Thornton Park , MD 01/20/2019, 8:50 AM

## 2019-01-21 MED ORDER — BISACODYL 10 MG RE SUPP: 10.0000 mg | Freq: Every day | RECTAL | 0 refills | Status: DC | PRN

## 2019-01-21 MED ORDER — ACETAMINOPHEN 325 MG PO TABS: 650.0000 mg | ORAL_TABLET | Freq: Four times a day (QID) | ORAL | 0 refills | Status: DC | PRN

## 2019-01-21 MED ORDER — DOCUSATE SODIUM 100 MG PO CAPS: 100.0000 mg | ORAL_CAPSULE | Freq: Two times a day (BID) | ORAL | 0 refills | Status: DC

## 2019-01-21 MED ORDER — MAGNESIUM HYDROXIDE 400 MG/5ML PO SUSP: 30.0000 mL | Freq: Every day | ORAL | 0 refills | Status: DC | PRN

## 2019-01-21 NOTE — Progress Notes (Signed)
Called Report to [facility] at 1319, all questions answered during report. EMS called for transport at 1321. Pt is 4th in line for transport.

## 2019-01-21 NOTE — TOC Transition Note (Signed)
Transition of Care [facility]) - CM/SW Discharge Note   Patient Details  Name: Sheryl Suarez MRN: [id number] Date of Birth: 08-08-1923  [facility]) CM/SW Contact:  Shelbie Hutching, RN Phone Number: 01/21/2019, 9:04 AM   Clinical Narrative:    [facility] today, going to room 802.  Bedside RN will call report to [phone]. Patient will transport via [employer].    Final next level of care: Skilled Nursing Facility Barriers to Discharge: Barriers Resolved   Patient Goals and CMS Choice Patient states their goals for this hospitalization and ongoing recovery are:: like to be able to take care of myself again, I do not want to depend on anyone CMS Medicare.gov Compare Post Acute Care list provided to:: Patient    Discharge Placement              Patient chooses bed at: [facility] [place] Patient to be transferred to facility by: [facility] Name of family member notified: Ernest Pine - daughter Patient and family notified of of transfer: 01/21/19  Discharge Plan and Services   Discharge Planning Services: CM Consult                                 Social Determinants of Health (SDOH) Interventions     Readmission Risk Interventions No flowsheet data found.

## 2019-01-21 NOTE — Progress Notes (Signed)
Physical Therapy Treatment Patient Details Name: Sheryl Suarez MRN: [id number] DOB: 1923/11/26 Today's Date: 01/21/2019    History of Present Illness 85 y.o. female with medical history significant of HTN, CAD, s/p CABG, sCHF (EF 30%), A-fib not on [facility], h/o breast cancer, presented to ED on 01/15/2019 after multiple falls. She laid on the floor overnight before being found by her daughter. Radiograph showed acute distracted fracture of the olecranon process of proximal ulna. She was admitted to the hospital for closed fracture dislocation of R elbow and closed 2nd and third rib fracture, rhabdomyolysis, and elevated troponin likely related to rhabdo. She underwent ORIF to the R elbow 01/16/2019. NWB R UE per Dr. Donney Rankins    PT Comments    Pt sitting on [facility] upon PT arrival and reporting being finished and needing assist with clean-up.  Pt requiring 1 assist with transfers and dependent with toileting hygiene.  Pt requiring increased assist attempting to ambulate with quad cane and was losing her balance backwards taking steps in place requiring 1 assist for balance and safety; improved ability to ambulate noted with 2 assist and L hand hold assist x40 feet (only occasional loss of balance; improved gait and balance overall noted).  Pt appearing very motivated to participate in therapy but appearing with generalized weakness and decreased activity tolerance.  Pt reporting no pain during entire session.  Will continue to focus on strengthening and progressive functional mobility during hospital stay.    Follow Up Recommendations  SNF     Equipment Recommendations  3in1 (PT)(hemi-walker vs quad cane)    Recommendations for Other Services OT consult     Precautions / Restrictions Precautions Precautions: Fall Restrictions Weight Bearing Restrictions: Yes RUE Weight Bearing: Non weight bearing    Mobility  Bed Mobility               General bed mobility comments: Deferred (pt on BSC  beginning of session and in recliner end of session)  Transfers Overall transfer level: Needs assistance Equipment used: None;Quad cane   Sit to Stand: Min assist;Mod assist Stand pivot transfers: Mod assist       General transfer comment: min to mod assist to stand from [facility] x2 trials; stand step turn BSC to recliner (pt reaching for chair with L UE for support during transfer) mod assist x1; sit to/from stand from recliner x2 trials with min assist (vc's for L UE and B LE placement, scooting forward to edge of chair prior to standing)  Ambulation/Gait Ambulation/Gait assistance: (mod to max assist x1 first trial with quad cane; min assist x2 2nd trial with L hand hold assist) Gait Distance (Feet): (pt marched in place x5 reps B LE's with L quad cane use; x40 feet with min assist x2 and L hand hold assist)     Gait velocity: decreased   General Gait Details: pt unsteady and losing her balance backwards marching in place with L quad cane use requiring mod to max assist of therapist to maintain pt's balance; pt with occasional loss of balance backwards ambulating with L hand hold assist and min assist x2 but improved step length and overall balance noted with 2 assist   Stairs             Wheelchair Mobility    Modified Rankin (Stroke Patients Only)       Balance Overall balance assessment: Needs assistance Sitting-balance support: No upper extremity supported;Feet supported Sitting balance-Leahy Scale: Good Sitting balance - Comments: steady sitting reaching  within BOS with L UE   Standing balance support: Single extremity supported Standing balance-Leahy Scale: Poor Standing balance comment: pt losing balance backwards intermittently with standing activities requiring assist to regain balance                            Cognition Arousal/Alertness: Awake/alert Behavior During Therapy: WFL for tasks assessed/performed Overall Cognitive Status: Within  Functional Limits for tasks assessed                                 General Comments: Oriented to person and hospital (although asked if she was in [place] and which hospital she was in); oriented to situation      Exercises      General Comments General comments (skin integrity, edema, etc.): L UE in splint and in sling during session (sling adjusted for improved fit during session).  Nursing cleared pt for participation in physical therapy.  Pt agreeable to PT session.      Pertinent Vitals/Pain Pain Assessment: No/denies pain Pain Intervention(s): Limited activity within patient's tolerance;Monitored during session;Repositioned  Vitals (HR and O2 on room air) stable and WFL throughout treatment session.    Home Living                      Prior Function            PT Goals (current goals can now be found in the care plan section) Acute Rehab PT Goals Patient Stated Goal: return home PT Goal Formulation: With patient Time For Goal Achievement: 01/31/19 Potential to Achieve Goals: Fair Progress towards PT goals: Progressing toward goals    Frequency    BID      PT Plan Current plan remains appropriate    Co-evaluation              AM-PAC PT "6 Clicks" Mobility   Outcome Measure  Help needed turning from your back to your side while in a flat bed without using bedrails?: A Lot Help needed moving from lying on your back to sitting on the side of a flat bed without using bedrails?: A Lot Help needed moving to and from a bed to a chair (including a wheelchair)?: A Lot Help needed standing up from a chair using your arms (e.g., wheelchair or bedside chair)?: A Lot Help needed to walk in hospital room?: A Lot Help needed climbing 3-5 steps with a railing? : Total 6 Click Score: 11    End of Session Equipment Utilized During Treatment: Gait belt Activity Tolerance: Patient tolerated treatment well Patient left: in chair;with call  bell/phone within reach;with chair alarm set;with SCD's reapplied;Other (comment)(B heels floating via pillow; R UE in sling and supported on pillow) Nurse Communication: Mobility status;Precautions(via white board) PT Visit Diagnosis: Unsteadiness on feet (R26.81);Other abnormalities of gait and mobility (R26.89);Repeated falls (R29.6);Muscle weakness (generalized) (M62.81)     Time: [id number] PT Time Calculation (min) (ACUTE ONLY): 34 min  Charges:  $Gait Training: 8-22 mins $Therapeutic Activity: 8-22 mins                     Leitha Bleak, PT 01/21/19, 10:08 AM [phone]

## 2019-01-21 NOTE — Care Management Important Message (Signed)
Important Message  Patient Details  Name: Sheryl Suarez MRN: [id number] Date of Birth: 1924-01-29   Medicare Important Message Given:  Yes     Juliann Pulse A Maysen Sudol 01/21/2019, 10:03 AM

## 2019-01-21 NOTE — Progress Notes (Signed)
D: Pt alert and oriented. Pt denies experiencing any pain at this time.  A: Pt and daughter received discharge and medication education/information. Pt belongings were gathered and taken with daughter.   R: Pt and daughter verbalized understanding of discharge and medication education/information.  [facility]. D[facility] has left to [facility] at [facility].

## 2019-04-23 ENCOUNTER — Inpatient Hospital Stay
Admission: EM | Admit: 2019-04-23 | Discharge: 2019-04-25 | DRG: 291 | Disposition: A | Discharge status: Skilled Nursing Facility | Payer: Medicare Other | Attending: Internal Medicine | Admitting: Internal Medicine

## 2019-04-23 ENCOUNTER — Encounter: Payer: Self-pay | Admitting: Emergency Medicine

## 2019-04-23 ENCOUNTER — Other Ambulatory Visit: Payer: Self-pay

## 2019-04-23 ENCOUNTER — Emergency Department: Payer: Medicare Other

## 2019-04-23 DIAGNOSIS — I081 Rheumatic disorders of both mitral and tricuspid valves: Secondary | ICD-10-CM | POA: Diagnosis present

## 2019-04-23 DIAGNOSIS — Z853 Personal history of malignant neoplasm of breast: Secondary | ICD-10-CM

## 2019-04-23 DIAGNOSIS — I509 Heart failure, unspecified: Secondary | ICD-10-CM | POA: Diagnosis not present

## 2019-04-23 DIAGNOSIS — J9601 Acute respiratory failure with hypoxia: Secondary | ICD-10-CM | POA: Diagnosis not present

## 2019-04-23 DIAGNOSIS — N3000 Acute cystitis without hematuria: Secondary | ICD-10-CM

## 2019-04-23 DIAGNOSIS — Z20822 Contact with and (suspected) exposure to covid-19: Secondary | ICD-10-CM | POA: Diagnosis present

## 2019-04-23 DIAGNOSIS — Z79899 Other long term (current) drug therapy: Secondary | ICD-10-CM

## 2019-04-23 DIAGNOSIS — Z9013 Acquired absence of bilateral breasts and nipples: Secondary | ICD-10-CM

## 2019-04-23 DIAGNOSIS — Z66 Do not resuscitate: Secondary | ICD-10-CM | POA: Diagnosis present

## 2019-04-23 DIAGNOSIS — R001 Bradycardia, unspecified: Secondary | ICD-10-CM

## 2019-04-23 DIAGNOSIS — I5043 Acute on chronic combined systolic (congestive) and diastolic (congestive) heart failure: Secondary | ICD-10-CM | POA: Diagnosis present

## 2019-04-23 DIAGNOSIS — I2510 Atherosclerotic heart disease of native coronary artery without angina pectoris: Secondary | ICD-10-CM | POA: Diagnosis present

## 2019-04-23 DIAGNOSIS — R7989 Other specified abnormal findings of blood chemistry: Secondary | ICD-10-CM

## 2019-04-23 DIAGNOSIS — Z9049 Acquired absence of other specified parts of digestive tract: Secondary | ICD-10-CM

## 2019-04-23 DIAGNOSIS — W19XXXA Unspecified fall, initial encounter: Secondary | ICD-10-CM | POA: Diagnosis not present

## 2019-04-23 DIAGNOSIS — I110 Hypertensive heart disease with heart failure: Principal | ICD-10-CM | POA: Diagnosis present

## 2019-04-23 DIAGNOSIS — I5031 Acute diastolic (congestive) heart failure: Secondary | ICD-10-CM | POA: Diagnosis not present

## 2019-04-23 DIAGNOSIS — Z7982 Long term (current) use of aspirin: Secondary | ICD-10-CM

## 2019-04-23 DIAGNOSIS — N390 Urinary tract infection, site not specified: Secondary | ICD-10-CM | POA: Diagnosis present

## 2019-04-23 DIAGNOSIS — Z90710 Acquired absence of both cervix and uterus: Secondary | ICD-10-CM | POA: Diagnosis not present

## 2019-04-23 DIAGNOSIS — E785 Hyperlipidemia, unspecified: Secondary | ICD-10-CM | POA: Diagnosis present

## 2019-04-23 DIAGNOSIS — Z9882 Breast implant status: Secondary | ICD-10-CM

## 2019-04-23 DIAGNOSIS — I10 Essential (primary) hypertension: Secondary | ICD-10-CM | POA: Diagnosis not present

## 2019-04-23 DIAGNOSIS — I5023 Acute on chronic systolic (congestive) heart failure: Secondary | ICD-10-CM | POA: Diagnosis not present

## 2019-04-23 DIAGNOSIS — Z8249 Family history of ischemic heart disease and other diseases of the circulatory system: Secondary | ICD-10-CM | POA: Diagnosis not present

## 2019-04-23 DIAGNOSIS — I482 Chronic atrial fibrillation, unspecified: Secondary | ICD-10-CM | POA: Diagnosis not present

## 2019-04-23 DIAGNOSIS — Z7951 Long term (current) use of inhaled steroids: Secondary | ICD-10-CM

## 2019-04-23 DIAGNOSIS — J189 Pneumonia, unspecified organism: Secondary | ICD-10-CM | POA: Diagnosis present

## 2019-04-23 DIAGNOSIS — R296 Repeated falls: Secondary | ICD-10-CM

## 2019-04-23 DIAGNOSIS — R531 Weakness: Secondary | ICD-10-CM

## 2019-04-23 LAB — CBC
HCT: 35.6 % — ABNORMAL LOW (ref 36.0–46.0)
Hemoglobin: 11.1 g/dL — ABNORMAL LOW (ref 12.0–15.0)
MCH: 30.1 pg (ref 26.0–34.0)
MCHC: 31.2 g/dL (ref 30.0–36.0)
MCV: 96.5 fL (ref 80.0–100.0)
Platelets: 258 10*3/uL (ref 150–400)
RBC: 3.69 MIL/uL — ABNORMAL LOW (ref 3.87–5.11)
RDW: 15.3 % (ref 11.5–15.5)
WBC: 8.3 10*3/uL (ref 4.0–10.5)
nRBC: 0 % (ref 0.0–0.2)

## 2019-04-23 LAB — BASIC METABOLIC PANEL
Anion gap: 5 (ref 5–15)
BUN: 31 mg/dL — ABNORMAL HIGH (ref 8–23)
CO2: 30 mmol/L (ref 22–32)
Calcium: 10 mg/dL (ref 8.9–10.3)
Chloride: 105 mmol/L (ref 98–111)
Creatinine, Ser: 0.88 mg/dL (ref 0.44–1.00)
GFR calc Af Amer: >60 mL/min (ref >60)
GFR calc non Af Amer: 57 mL/min — ABNORMAL LOW (ref >60)
Glucose, Bld: 102 mg/dL — ABNORMAL HIGH (ref 70–99)
Potassium: 4.2 mmol/L (ref 3.5–5.1)
Sodium: 140 mmol/L (ref 135–145)

## 2019-04-23 LAB — TROPONIN I (HIGH SENSITIVITY)
Troponin I (High Sensitivity): 25 ng/L — ABNORMAL HIGH (ref <18)
Troponin I (High Sensitivity): 25 ng/L — ABNORMAL HIGH (ref <18)
Troponin I (High Sensitivity): 26 ng/L — ABNORMAL HIGH (ref <18)
Troponin I (High Sensitivity): 21 ng/L — ABNORMAL HIGH (ref <18)

## 2019-04-23 LAB — RESPIRATORY PANEL BY RT PCR (FLU A&B, COVID)
Influenza A by PCR: NEGATIVE
Influenza B by PCR: NEGATIVE
SARS Coronavirus 2 by RT PCR: NEGATIVE

## 2019-04-23 LAB — URINALYSIS, COMPLETE (UACMP) WITH MICROSCOPIC
Bilirubin Urine: NEGATIVE
Glucose, UA: NEGATIVE mg/dL
Hgb urine dipstick: NEGATIVE
Ketones, ur: NEGATIVE mg/dL
Nitrite: NEGATIVE
Protein, ur: 30 mg/dL — AB
Specific Gravity, Urine: 1.023 (ref 1.005–1.030)
Squamous Epithelial / HPF: NONE SEEN (ref 0–5)
pH: 5 (ref 5.0–8.0)

## 2019-04-23 LAB — BRAIN NATRIURETIC PEPTIDE: B Natriuretic Peptide: 1715 pg/mL — ABNORMAL HIGH (ref 0.0–100.0)

## 2019-04-23 LAB — POC SARS CORONAVIRUS 2 AG: SARS Coronavirus 2 Ag: NEGATIVE

## 2019-04-23 LAB — PROCALCITONIN: Procalcitonin: <0.1 ng/mL

## 2019-04-23 IMAGING — CT CT HEAD W/O CM
3 series · 16 of 46 positions shown, 19 images · non-contrast
Comparison: [DATE]

CLINICAL DATA: Weakness

EXAM:
CT HEAD WITHOUT CONTRAST
TECHNIQUE: Contiguous axial images were obtained from the base of the skull
through the vertex without intravenous contrast.

[Series 2: head wo · axial · 0.47mm/px · z∈[-174,-54]mm · 10 of 29 slices shown, 13 images]
[im 3/29  brain]
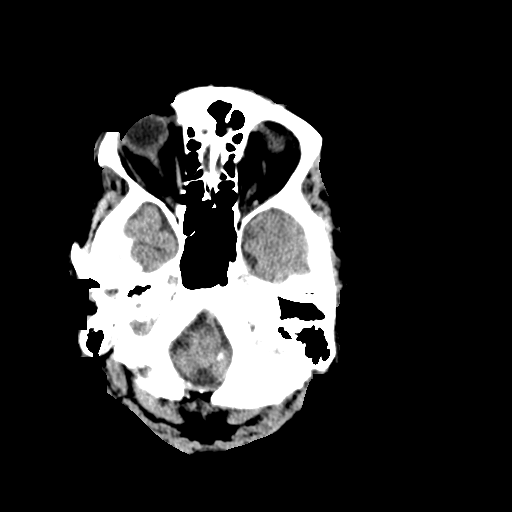
[im 3/29  bone]
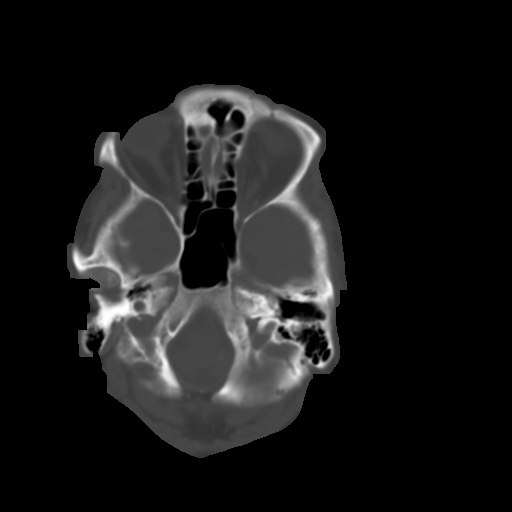
[im 6/29  brain]
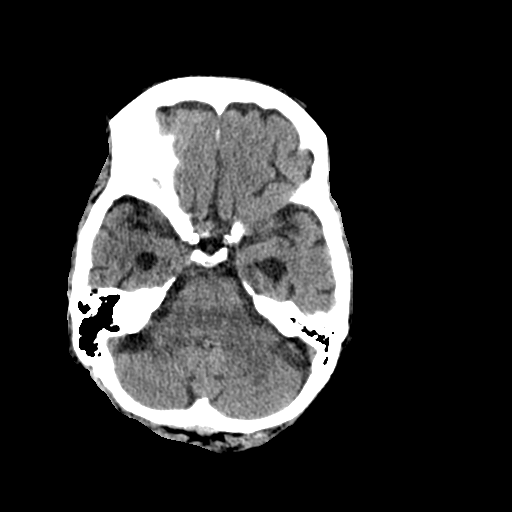
[im 8/29  brain]
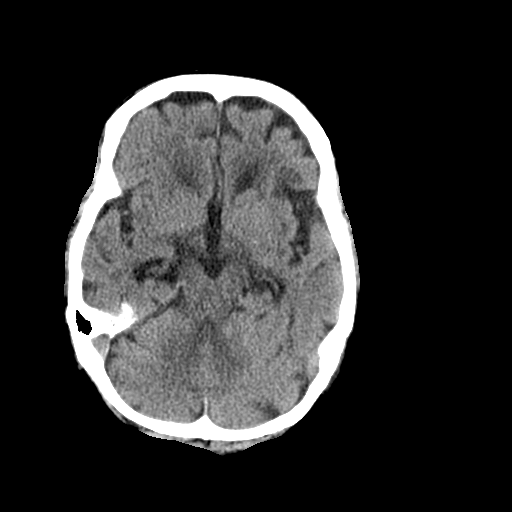
[im 11/29  brain]
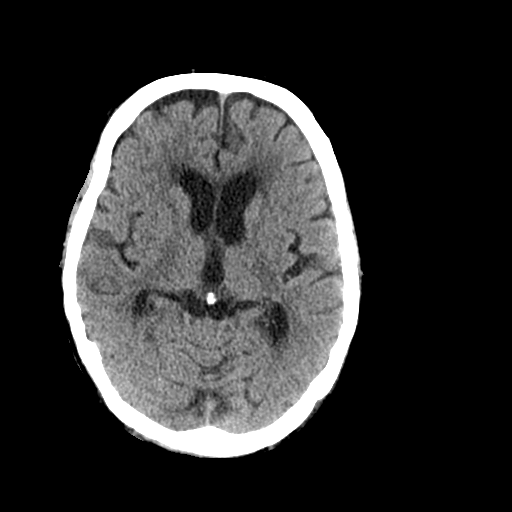
[im 14/29  brain]
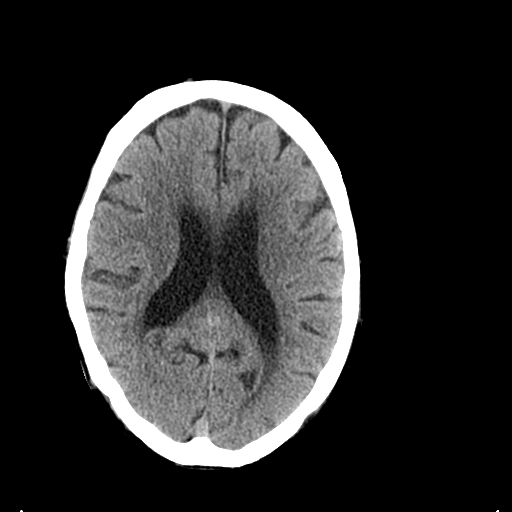
[im 14/29  bone]
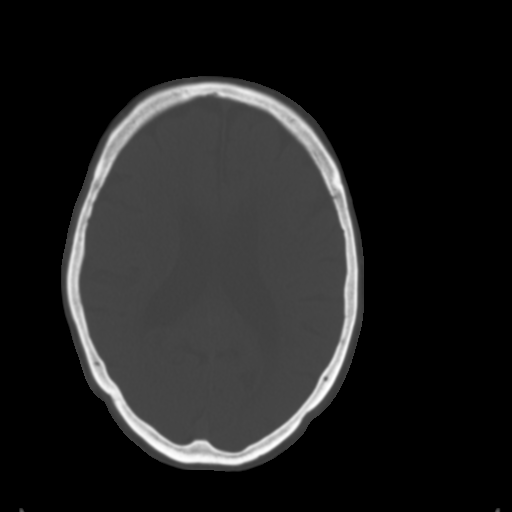
[im 16/29  brain]
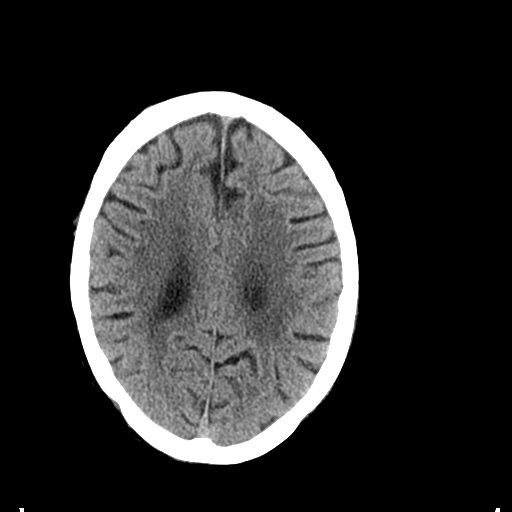
[im 19/29  brain]
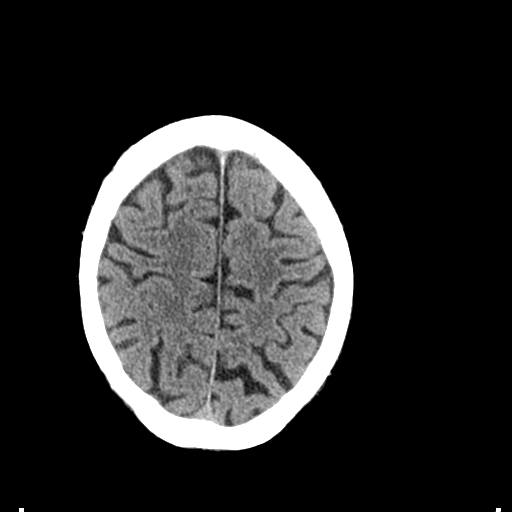
[im 22/29  brain]
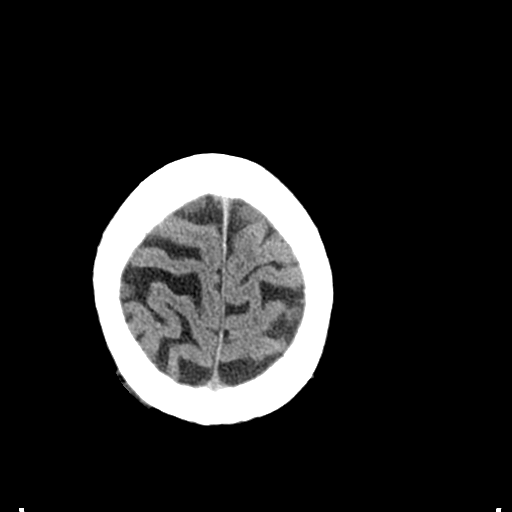
[im 24/29  brain]
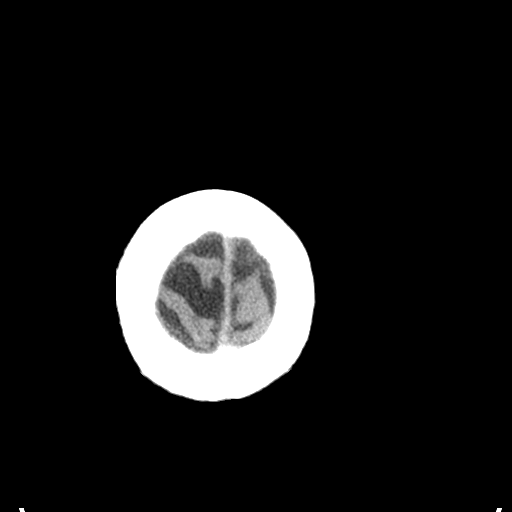
[im 24/29  bone]
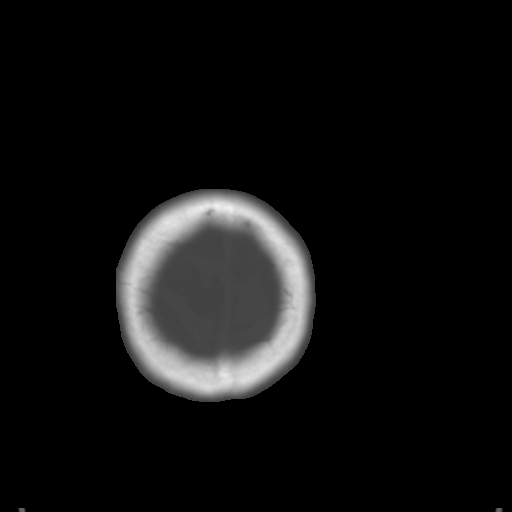
[im 27/29  brain]
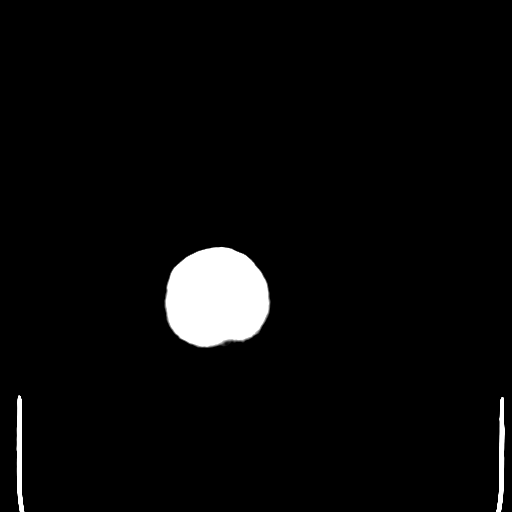

[Series 4: coronal soft tissue · coronal · 0.30mm/px · 3 of 65 slices shown]
[im 22/65  brain]
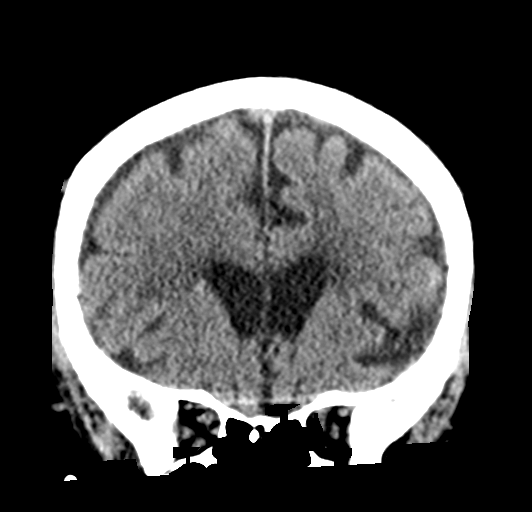
[im 29/65  brain]
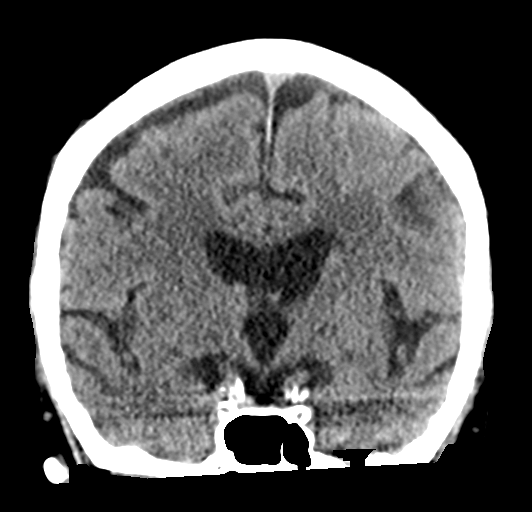
[im 36/65  brain]
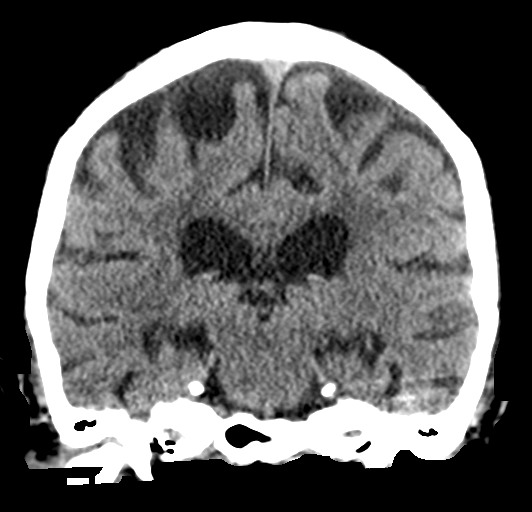

[Series 5: sagittal soft tissue · sagittal · 0.29mm/px · 3 of 52 slices shown]
[im 18/52  brain]
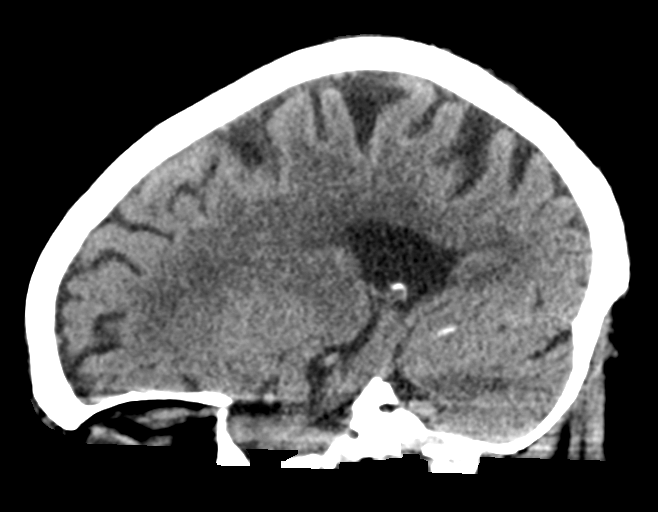
[im 26/52  brain]
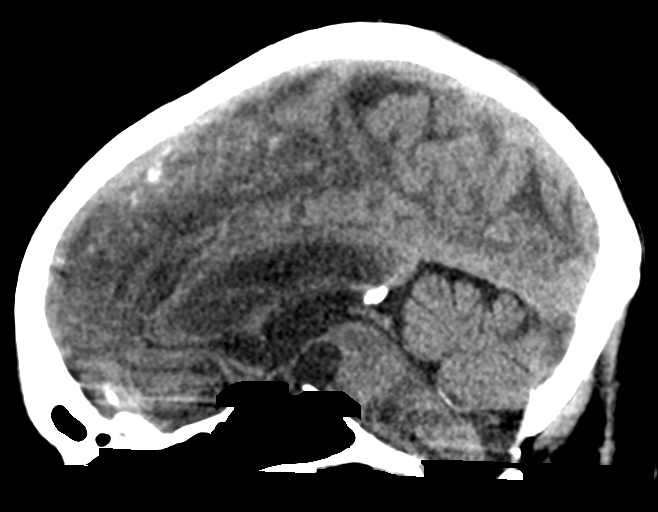
[im 35/52  brain]
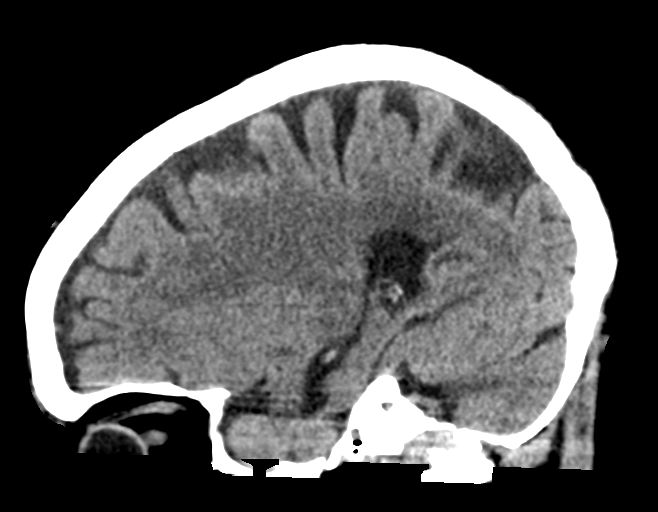

[16 of 46 positions shown; findings below may reference images not displayed]

FINDINGS: Brain: There is atrophy and chronic small vessel disease changes. No
acute intracranial abnormality. Specifically, no hemorrhage,
hydrocephalus, mass lesion, acute infarction, or significant
intracranial injury.

Vascular: No hyperdense vessel or unexpected calcification.

Skull: No acute calvarial abnormality.

Sinuses/Orbits: Visualized paranasal sinuses and mastoids clear.
Orbital soft tissues unremarkable.

Other: None
IMPRESSION: Atrophy, chronic microvascular disease.

No acute intracranial abnormality.

## 2019-04-23 IMAGING — DX DG CHEST 1V PORT
1 series · 1 of 1 positions shown · non-contrast
Comparison: Portable exam [71] hours compared to [DATE]

CLINICAL DATA: Increasing weakness, fatigue, multiple falls over
the past week, hypertension, breast cancer history

EXAM:
PORTABLE CHEST 1 VIEW

[chest ap]
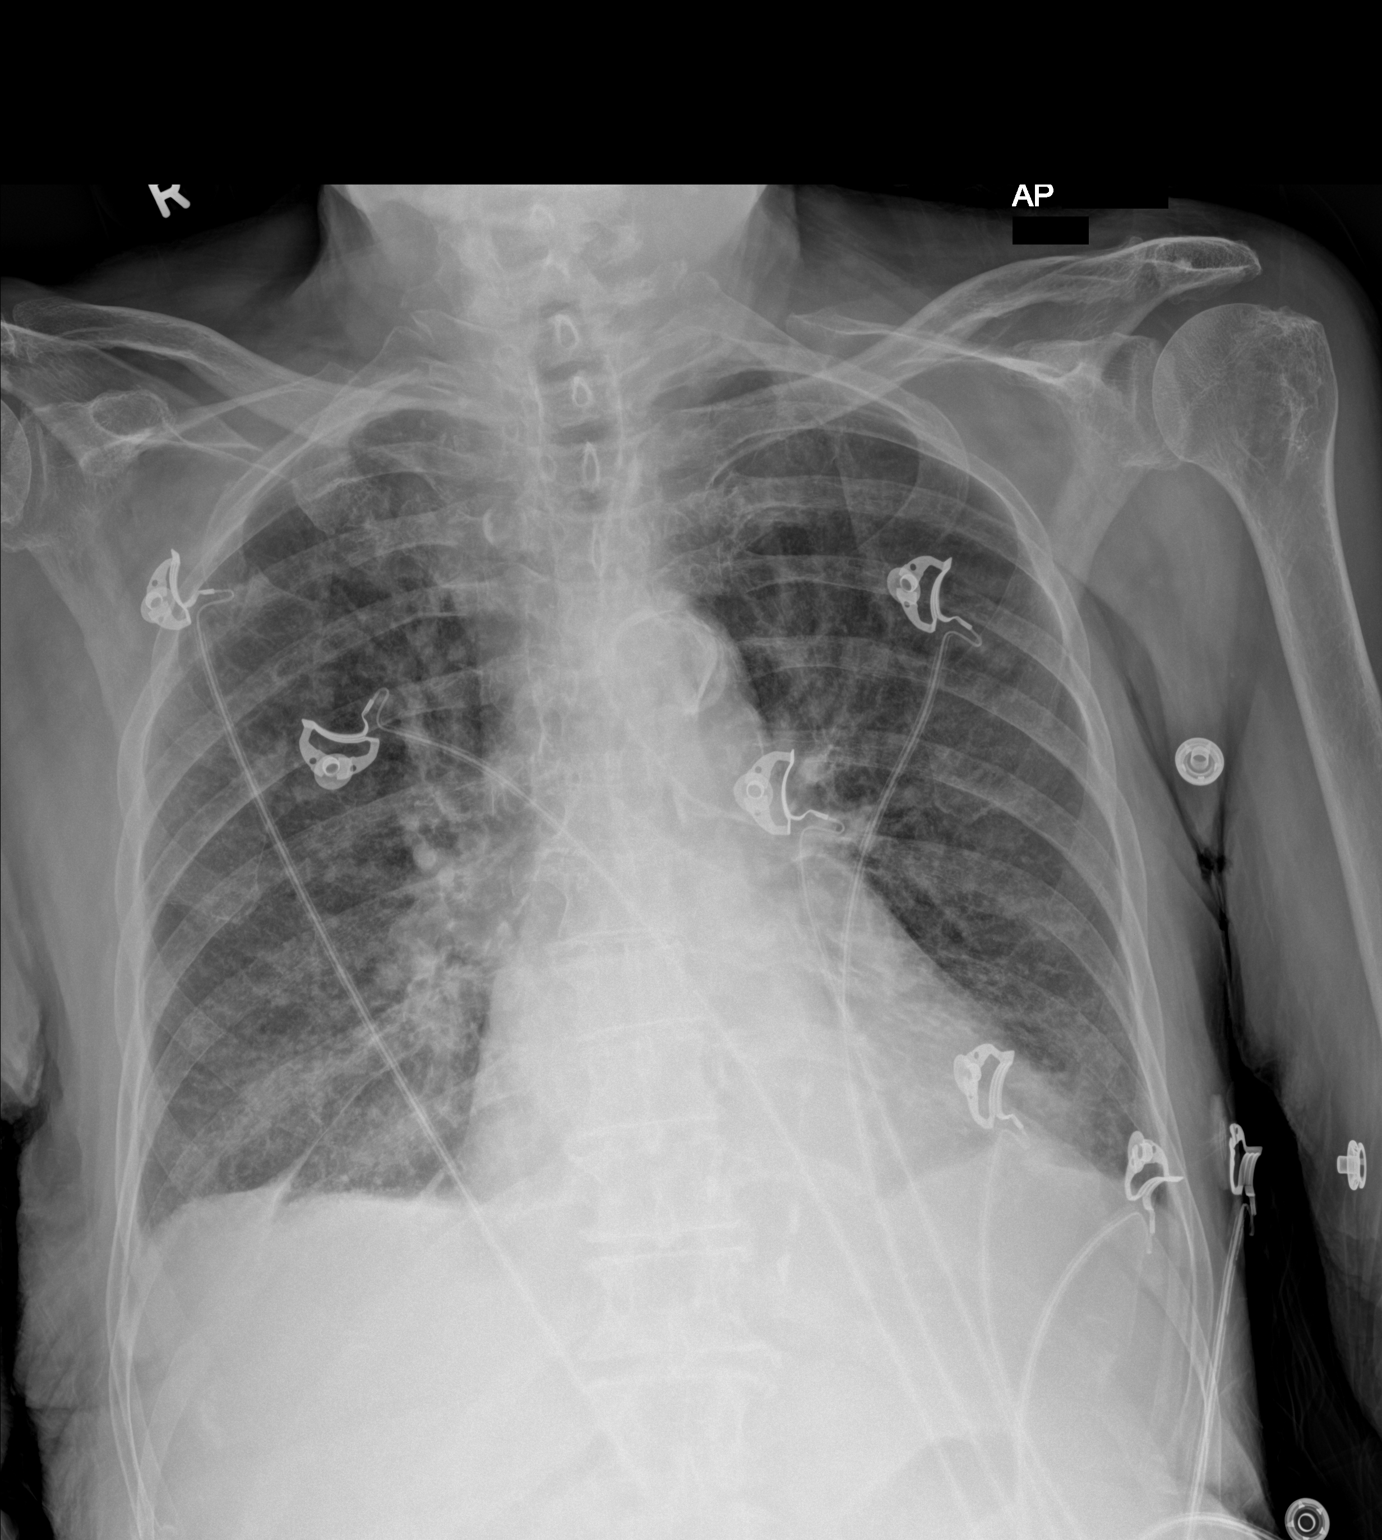

[1 of 1 positions shown; findings below may reference images not displayed]

FINDINGS: Enlargement of cardiac silhouette with pulmonary vascular
congestion.

Atherosclerotic calcification aorta.

Hazy interstitial infiltrates bilaterally question pulmonary edema
versus multifocal infection.

No pleural effusion or pneumothorax.

Bones demineralized.
IMPRESSION: BILATERAL pulmonary infiltrates question pulmonary edema versus
multifocal infection.

## 2019-04-23 MED ORDER — DILTIAZEM HCL ER COATED BEADS 120 MG PO CP24
120.0000 mg | ORAL_CAPSULE | Freq: Every day | ORAL | Status: DC
  Administered 2019-04-24: 120 mg via ORAL
  Filled 2019-04-23: qty 1

## 2019-04-23 MED ORDER — SODIUM CHLORIDE 0.9% FLUSH: 3.0000 mL | INTRAVENOUS | Status: DC | PRN

## 2019-04-23 MED ORDER — CALCIUM CARBONATE-VITAMIN D 500-200 MG-UNIT PO TABS
1.0000 | ORAL_TABLET | Freq: Every day | ORAL | Status: DC
  Administered 2019-04-24 – 2019-04-25 (×2): 1 via ORAL
  Filled 2019-04-23 (×2): qty 1

## 2019-04-23 MED ORDER — ALBUTEROL SULFATE (2.5 MG/3ML) 0.083% IN NEBU: 3.0000 mL | INHALATION_SOLUTION | RESPIRATORY_TRACT | Status: DC | PRN

## 2019-04-23 MED ORDER — SODIUM CHLORIDE 0.9 % IV SOLN
500.0000 mg | Freq: Once | INTRAVENOUS | Status: AC
  Administered 2019-04-23: 500 mg via INTRAVENOUS
  Filled 2019-04-23: qty 500

## 2019-04-23 MED ORDER — LISINOPRIL 5 MG PO TABS
2.5000 mg | ORAL_TABLET | Freq: Every day | ORAL | Status: DC
  Administered 2019-04-24 – 2019-04-25 (×2): 2.5 mg via ORAL
  Filled 2019-04-23 (×2): qty 1

## 2019-04-23 MED ORDER — HYDRALAZINE HCL 25 MG PO TABS: 25.0000 mg | ORAL_TABLET | Freq: Three times a day (TID) | ORAL | Status: DC | PRN

## 2019-04-23 MED ORDER — ASPIRIN EC 81 MG PO TBEC: 81.0000 mg | DELAYED_RELEASE_TABLET | Freq: Every day | ORAL | Status: DC

## 2019-04-23 MED ORDER — SODIUM CHLORIDE 0.9 % IV SOLN
1.0000 g | INTRAVENOUS | Status: DC
  Administered 2019-04-24: 1 g via INTRAVENOUS
  Filled 2019-04-23: qty 10
  Filled 2019-04-23: qty 1

## 2019-04-23 MED ORDER — ENOXAPARIN SODIUM 40 MG/0.4ML ~~LOC~~ SOLN
30.0000 mg | SUBCUTANEOUS | Status: DC
  Administered 2019-04-23: 30 mg via SUBCUTANEOUS
  Filled 2019-04-23: qty 0.4

## 2019-04-23 MED ORDER — METOPROLOL TARTRATE 50 MG PO TABS
100.0000 mg | ORAL_TABLET | Freq: Two times a day (BID) | ORAL | Status: DC
  Administered 2019-04-23 – 2019-04-24 (×2): 100 mg via ORAL
  Filled 2019-04-23 (×2): qty 2

## 2019-04-23 MED ORDER — IPRATROPIUM-ALBUTEROL 0.5-2.5 (3) MG/3ML IN SOLN
3.0000 mL | Freq: Four times a day (QID) | RESPIRATORY_TRACT | Status: DC
  Administered 2019-04-23 – 2019-04-24 (×5): 3 mL via RESPIRATORY_TRACT
  Filled 2019-04-23 (×5): qty 3

## 2019-04-23 MED ORDER — SODIUM CHLORIDE 0.9% FLUSH
3.0000 mL | Freq: Two times a day (BID) | INTRAVENOUS | Status: DC
  Administered 2019-04-23 – 2019-04-25 (×4): 3 mL via INTRAVENOUS

## 2019-04-23 MED ORDER — ISOSORBIDE MONONITRATE ER 30 MG PO TB24
30.0000 mg | ORAL_TABLET | Freq: Every day | ORAL | Status: DC
  Administered 2019-04-24 – 2019-04-25 (×2): 30 mg via ORAL
  Filled 2019-04-23 (×2): qty 1

## 2019-04-23 MED ORDER — FUROSEMIDE 10 MG/ML IJ SOLN
20.0000 mg | Freq: Two times a day (BID) | INTRAMUSCULAR | Status: DC
  Administered 2019-04-24 – 2019-04-25 (×3): 20 mg via INTRAVENOUS
  Filled 2019-04-23 (×3): qty 2

## 2019-04-23 MED ORDER — SODIUM CHLORIDE 0.9 % IV SOLN
1.0000 g | Freq: Once | INTRAVENOUS | Status: AC
  Administered 2019-04-23: 1 g via INTRAVENOUS
  Filled 2019-04-23: qty 10

## 2019-04-23 MED ORDER — FUROSEMIDE 10 MG/ML IJ SOLN
20.0000 mg | Freq: Once | INTRAMUSCULAR | Status: AC
  Administered 2019-04-23: 20 mg via INTRAVENOUS
  Filled 2019-04-23: qty 4

## 2019-04-23 MED ORDER — ONDANSETRON HCL 4 MG/2ML IJ SOLN: 4.0000 mg | Freq: Four times a day (QID) | INTRAMUSCULAR | Status: DC | PRN

## 2019-04-23 MED ORDER — DM-GUAIFENESIN ER 30-600 MG PO TB12
1.0000 | ORAL_TABLET | Freq: Two times a day (BID) | ORAL | Status: DC
  Administered 2019-04-23 – 2019-04-25 (×4): 1 via ORAL
  Filled 2019-04-23 (×4): qty 1

## 2019-04-23 MED ORDER — SODIUM CHLORIDE 0.9 % IV SOLN: 250.0000 mL | INTRAVENOUS | Status: DC | PRN

## 2019-04-23 MED ORDER — ACETAMINOPHEN 325 MG PO TABS: 650.0000 mg | ORAL_TABLET | ORAL | Status: DC | PRN

## 2019-04-23 MED ORDER — PRAVASTATIN SODIUM 40 MG PO TABS
40.0000 mg | ORAL_TABLET | Freq: Every day | ORAL | Status: DC
  Administered 2019-04-24: 40 mg via ORAL
  Filled 2019-04-23: qty 1

## 2019-04-23 MED ORDER — ASPIRIN EC 81 MG PO TBEC
81.0000 mg | DELAYED_RELEASE_TABLET | Freq: Every day | ORAL | Status: DC
  Administered 2019-04-24 – 2019-04-25 (×2): 81 mg via ORAL
  Filled 2019-04-23 (×2): qty 1

## 2019-04-23 NOTE — ED Notes (Signed)
Admitting MD at bedside.

## 2019-04-23 NOTE — ED Notes (Signed)
Attempted to change pt gown d/t blood stains from prior to arrival. Pt states she "wants to wait until morning"

## 2019-04-23 NOTE — ED Notes (Signed)
Pt to CT at this time.

## 2019-04-23 NOTE — ED Notes (Signed)
Cultures ordered after antibiotics ordered and started.

## 2019-04-23 NOTE — ED Notes (Signed)
Daughter [name] updated

## 2019-04-23 NOTE — ED Provider Notes (Signed)
[facility] Emergency Department Provider Note  ____________________________________________   First MD Initiated Contact with Patient 04/23/19 1032     (approximate)  I have reviewed the triage vital signs and the nursing notes.  History  Chief Complaint Weakness, Fatigue, and Fall    HPI Sheryl Suarez is a 86 y.o. female with history of HTN, CAD, HF (2019 echo LVEF 30%), atrial fibrillation (not on anticoagulation) who presents for generalized weakness and frequent falls. Patient states over the last week she has multiple falls, that she attributes to generalized weakness, particularly in her legs. Denies any injuries or pain related to the falls. No LOC, palpitations, dizziness. She is not on anticoagulation. She does report some shortness of breath that she describes as a tightness across her anterior chest. Mild. No radiation, no alleviating/aggravating components.    Past Medical Hx Past Medical History:  Diagnosis Date  . Breast cancer ([place])    remission  . Hypertension     Problem List Patient Active Problem List   Diagnosis Date Noted  . Closed fracture of right olecranon process   . Fall   . Generalized weakness   . Closed fracture dislocation of right elbow 01/15/2019  . Closed rib fracture 01/15/2019  . HTN (hypertension), benign 01/15/2019  . Chronic systolic CHF (congestive heart failure) ([place]) 01/15/2019  . CAD (coronary artery disease) 01/15/2019  . Atrial fibrillation, chronic ([place]) 01/15/2019  . Rhabdomyolysis 01/15/2019  . GI bleed 08/18/2015    Past Surgical Hx Past Surgical History:  Procedure Laterality Date  . ABDOMINAL HYSTERECTOMY    . APPENDECTOMY    . BREAST IMPLANT EXCHANGE    . CHOLECYSTECTOMY    . ESOPHAGOGASTRODUODENOSCOPY (EGD) WITH PROPOFOL N/A 08/20/2015   Procedure: ESOPHAGOGASTRODUODENOSCOPY (EGD) WITH PROPOFOL;  Surgeon: Lollie Sails, MD;  Location: [facility] ENDOSCOPY;  Service: Endoscopy;  Laterality:  N/A;  . MASTECTOMY Bilateral   . ORIF ELBOW FRACTURE Right 01/16/2019   Procedure: OPEN REDUCTION INTERNAL FIXATION (ORIF) ELBOW/OLECRANON FRACTURE;  Surgeon: Earnestine Leys, MD;  Location: [facility] ORS;  Service: Orthopedics;  Laterality: Right;    Medications Prior to Admission medications   Medication Sig Start Date End Date Taking? Authorizing Provider  acetaminophen (TYLENOL) 325 MG tablet Take 2 tablets (650 mg total) by mouth every 6 (six) hours as needed for mild pain or headache. 01/21/19  Yes Lorella Nimrod, MD  aspirin EC 81 MG tablet Take 81 mg by mouth daily.   Yes [provider]  bumetanide (BUMEX) 2 MG tablet Take 2 mg by mouth daily as needed (swelling).  12/12/18 12/12/19 Yes [provider]  Calcium Carbonate-Vitamin D (CALCIUM 600+D) 600-400 MG-UNIT tablet Take 1 tablet by mouth daily.    Yes [provider]  COMBIVENT RESPIMAT 20-100 MCG/ACT AERS respimat Inhale 2 puffs into the lungs 4 (four) times daily as needed. 01/08/19  Yes [provider]  diltiazem (CARDIZEM CD) 120 MG 24 hr capsule Take 120 mg by mouth daily. 01/07/19  Yes [provider]  isosorbide mononitrate (IMDUR) 30 MG 24 hr tablet Take 30 mg by mouth daily. 12/10/18  Yes [provider]  lovastatin (MEVACOR) 40 MG tablet Take 40 mg by mouth at bedtime. 01/06/19  Yes [provider]  metoprolol (LOPRESSOR) 100 MG tablet Take 100 mg by mouth 2 (two) times daily.   Yes [provider]    Allergies Patient has no known allergies.  Family Hx Family History  Problem Relation Age of Onset  .  CAD Mother   . CAD Father     Social Hx Social History   Tobacco Use  . Smoking status: Never Smoker  . Smokeless tobacco: Never Used  Substance Use Topics  . Alcohol use: No  . Drug use: Never     Review of Systems  Constitutional: Negative for fever, chills. + generalized weakness and falls Eyes: Negative for visual changes. ENT:  Negative for sore throat. Cardiovascular: Negative for chest pain. Respiratory: + for shortness of breath. Gastrointestinal: Negative for nausea, vomiting.  Genitourinary: Negative for dysuria. Musculoskeletal: Negative for leg swelling. Skin: Negative for rash. Neurological: Negative for headaches.   Physical Exam  Vital Signs: ED Triage Vitals  Enc Vitals Group     BP 04/23/19 1037 132/80     Pulse Rate 04/23/19 1037 76     Resp 04/23/19 1037 20     Temp 04/23/19 1037 97.8 F (36.6 C)     Temp Source 04/23/19 1037 Oral     SpO2 04/23/19 1037 96 %     Weight 04/23/19 1038 104 lb (47.2 kg)     Height 04/23/19 1038 5\' 2"  (1.575 m)     Head Circumference --      Peak Flow --      Pain Score 04/23/19 1038 0     Pain Loc --      Pain Edu? --      Excl. in [place]? --     Constitutional: Alert and oriented.  Head: Normocephalic. Atraumatic. Eyes: Conjunctivae clear. Sclera anicteric. Nose: No congestion. No rhinorrhea. Mouth/Throat: Wearing mask.  Neck: No stridor.   Cardiovascular: Normal rate, regular rhythm. Extremities well perfused. Respiratory: Appears somewhat short of breath, wheezing bilaterally. Winded with walking to the restroom, but not hypoxia.  Gastrointestinal: Soft. Non-tender. Non-distended.  Musculoskeletal: No lower extremity edema. No deformities. FROM bilateral shoulders, elbows, wrists, hips, knees, ankles. Neurologic:  Normal speech and language. UE strength equal and symmetric. Perhaps very subtle difference in strength to BLE - RLE 4/5, LLE 4+/5. SILT.  Skin: Scattered old ecchymosis of various stages to hands, forearm. Psychiatric: Mood and affect are appropriate for situation.  EKG  Personally reviewed.   Rate: 87 Rhythm: atrial fibrillation Axis: normal Intervals: WNL AF, rate controlled No acute ischemic changes No STEMI    Radiology  CT: IMPRESSION:  Atrophy, chronic microvascular disease.  No acute intracranial abnormality.    CXR:  IMPRESSION:  BILATERAL pulmonary infiltrates question pulmonary edema versus multifocal infection.    Procedures  Procedure(s) performed (including critical care):  Procedures   Initial Impression / Assessment and Plan / ED Course  86 y.o. female who presents to the ED for generalized weakness, frequent falls.   Ddx: UTI, dehydration, PNA, COVID, HF, anemia, deconditioning, CVA  CT head negative. CXR with bilateral infiltrates - question edema vs infection. UA c/f possible infection as well with LE, WBC, bacteria, though no nitrites. Will order ceftriaxone/azithromycin for coverage of both while awaiting further labs.   COVID antigen negative, PCR testing ordered. Procalcitonin negative. BNP elevated at 1715, therefore suspect XR and exam findings more likely 2/2 edema. Will give low dose Lasix. She is DOE but has no hypoxia. Very minimal troponin elevation at 25 likely due to demand.   Will admit for further management of fluid status, poss UTI, weakness. Patient agreeable. Discussed w/ hospitalist for admission.   Final Clinical Impression(s) / ED Diagnosis  Final diagnoses:  Generalized weakness  Urinary tract infection in elderly patient  Elevated  brain natriuretic peptide (BNP) level       Note:  This document was prepared using Dragon voice recognition software and may include unintentional dictation errors.   Lilia Pro., MD 04/23/19 [phone]

## 2019-04-23 NOTE — Progress Notes (Signed)
PHARMACIST - PHYSICIAN COMMUNICATION  CONCERNING:  Enoxaparin (Lovenox) for DVT Prophylaxis   RECOMMENDATION: Patient was prescribed enoxaprin 40mg  q24 hours for VTE prophylaxis.   Filed Weights   04/23/19 1038  Weight: 104 lb (47.2 kg)    Body mass index is 19.02 kg/m.  Estimated Creatinine Clearance: 29.8 mL/min (by C-G formula based on SCr of 0.88 mg/dL).  Patient is candidate for enoxaparin 30mg  every 24 hours based on CrCl <20ml/min   DESCRIPTION: Pharmacy has adjusted enoxaparin dose per [facility] policy.  Patient is now receiving enoxaparin 30mg  every 24 hours.  Pernell Dupre, PharmD, BCPS Clinical Pharmacist 04/23/2019 4:57 PM

## 2019-04-23 NOTE — ED Notes (Signed)
Pt changed and peri care provided 

## 2019-04-23 NOTE — ED Triage Notes (Signed)
Pt in via [facility] from home with complaints of increasing weakness, fatigue, multiple falls over the last week.  Pt denies pain from falls.  A/Ox4, vitals WDL, NAD noted at this time.    Per EMS, pt hypoxic and bradycardic with standing.    20G PIV to left AC placed prior to arrival.    CBG 185.

## 2019-04-23 NOTE — H&P (Addendum)
History and Physical    Sheryl Suarez [id number] DOB: [birth year] DOA: 04/23/2019  Referring MD/NP/PA:   PCP: Leonel Ramsay, MD   Patient coming from:  The patient is coming from home.  At baseline, pt is independent for most of ADL.        Chief Complaint: Generalized weakness, shortness of breath, fall  HPI: Sheryl Suarez is a 86 y.o. female with medical history significant of hypertension, hyperlipidemia, breast cancer (mastectomy bilateral), CHF with EF 30%, atrial fibrillation not on anticoagulants, GI bleeding, CAD, who presents with generalized weakness, fall, shortness of breath.  Patient states that she has been having generalized weakness recently, which has been progressively worsening.  She fell at least 7 times without significant injury.  No loss of consciousness.  Denies headache or neck pain.  Patient also has a shortness breath, dry cough, no fever or chills.  She has had chest congestion.  No nausea vomiting, diarrhea or abdominal pain she has increased urinary frequency, but no dysuria or burning on urination.  Per her daughter, patient has some tingling in in fingers of both hands.  No facial droop or slurred speech  ED Course: pt was found to have BNP 1715, troponin 25, positive urinalysis (hazy appearance, small amount of leukocyte, rare bacteria, WBC 21-50), negative Covid PCR, electrolytes renal function okay, WBC 8.3, temperature normal, blood pressure 140/88, heart rate 72, RR 19, oxygen saturation 96% on room air.  Chest x-ray showed bilateral interstitial pulmonary edema.  Patient is admitted to telemetry bed as inpatient.  Review of Systems:   General: no fevers, chills, no body weight gain, has poor appetite, has fatigue HEENT: no blurry vision, hearing changes or sore throat Respiratory: has dyspnea, coughing, no wheezing CV: no chest pain, no palpitations GI: no nausea, vomiting, abdominal pain, diarrhea, constipation GU: no dysuria, burning on  urination, has increased urinary frequency, no hematuria  Ext: has leg edema Neuro: no unilateral weakness, numbness, or tingling, no vision change or hearing loss Skin: no rash, no skin tear. MSK: No muscle spasm, no deformity, no limitation of range of movement in spin Heme: No easy bruising.  Travel history: No recent long distant travel.  Allergy: No Known Allergies  Past Medical History:  Diagnosis Date  . Breast cancer ([place])    remission  . Hypertension     Past Surgical History:  Procedure Laterality Date  . ABDOMINAL HYSTERECTOMY    . APPENDECTOMY    . BREAST IMPLANT EXCHANGE    . CHOLECYSTECTOMY    . ESOPHAGOGASTRODUODENOSCOPY (EGD) WITH PROPOFOL N/A 08/20/2015   Procedure: ESOPHAGOGASTRODUODENOSCOPY (EGD) WITH PROPOFOL;  Surgeon: Lollie Sails, MD;  Location: [facility] ENDOSCOPY;  Service: Endoscopy;  Laterality: N/A;  . MASTECTOMY Bilateral   . ORIF ELBOW FRACTURE Right 01/16/2019   Procedure: OPEN REDUCTION INTERNAL FIXATION (ORIF) ELBOW/OLECRANON FRACTURE;  Surgeon: Earnestine Leys, MD;  Location: [facility] ORS;  Service: Orthopedics;  Laterality: Right;    Social History:  reports that she has never smoked. She has never used smokeless tobacco. She reports that she does not drink alcohol or use drugs.  Family History:  Family History  Problem Relation Age of Onset  . CAD Mother   . CAD Father      Prior to Admission medications   Medication Sig Start Date End Date Taking? Authorizing Provider  acetaminophen (TYLENOL) 325 MG tablet Take 2 tablets (650 mg total) by mouth every 6 (six) hours as needed for mild pain or headache. 01/21/19  Yes Lorella Nimrod, MD  aspirin EC 81 MG tablet Take 81 mg by mouth daily.   Yes [provider]  bumetanide (BUMEX) 2 MG tablet Take 2 mg by mouth daily as needed (swelling).  12/12/18 12/12/19 Yes [provider]  Calcium Carbonate-Vitamin D (CALCIUM 600+D) 600-400 MG-UNIT tablet Take 1 tablet by mouth daily.    Yes  [provider]  COMBIVENT RESPIMAT 20-100 MCG/ACT AERS respimat Inhale 2 puffs into the lungs 4 (four) times daily as needed. 01/08/19  Yes [provider]  diltiazem (CARDIZEM CD) 120 MG 24 hr capsule Take 120 mg by mouth daily. 01/07/19  Yes [provider]  isosorbide mononitrate (IMDUR) 30 MG 24 hr tablet Take 30 mg by mouth daily. 12/10/18  Yes [provider]  lovastatin (MEVACOR) 40 MG tablet Take 40 mg by mouth at bedtime. 01/06/19  Yes [provider]  metoprolol (LOPRESSOR) 100 MG tablet Take 100 mg by mouth 2 (two) times daily.   Yes [provider]    Physical Exam: Vitals:   04/23/19 1430 04/23/19 1500 04/23/19 1530 04/23/19 1754  BP: (!) 161/105 (!) 159/82 (!) 157/78 140/63  Pulse: 81 65 (!) 57 66  Resp: 20 20 18 18   Temp:    97.8 F (36.6 C)  TempSrc:    Oral  SpO2: 91% 93% 95% 94%  Weight:      Height:       General: Not in acute distress HEENT:       Eyes: PERRL, EOMI, no scleral icterus.       ENT: No discharge from the ears and nose, no pharynx injection, no tonsillar enlargement.        Neck: positive JVD, no bruit, no mass felt. Heme: No neck lymph node enlargement. Cardiac: S1/S2, RRR, No murmurs, No gallops or rubs. Respiratory:  No rales, wheezing, rhonchi or rubs. GI: Soft, nondistended, nontender, no rebound pain, no organomegaly, BS present. GU: No hematuria Ext: 1+ pitting leg edema bilaterally. 2+DP/PT pulse bilaterally. Musculoskeletal: No joint deformities, No joint redness or warmth, no limitation of ROM in spin. Skin: No rashes.  Neuro: Alert, oriented X3, cranial nerves II-XII grossly intact, moves all extremities normally.  Psych: Patient is not psychotic, no suicidal or hemocidal ideation.  Labs on Admission: I have personally reviewed following labs and imaging studies  CBC: Recent Labs  Lab 04/23/19 1035  WBC 8.3  HGB 11.1*  HCT 35.6*  MCV 96.5  PLT 0000000   Basic Metabolic  Panel: Recent Labs  Lab 04/23/19 1035  NA 140  K 4.2  CL 105  CO2 30  GLUCOSE 102*  BUN 31*  CREATININE 0.88  CALCIUM 10.0   GFR: Estimated Creatinine Clearance: 29.8 mL/min (by C-G formula based on SCr of 0.88 mg/dL). Liver Function Tests: No results for input(s): AST, ALT, ALKPHOS, BILITOT, PROT, ALBUMIN in the last 168 hours. No results for input(s): LIPASE, AMYLASE in the last 168 hours. No results for input(s): AMMONIA in the last 168 hours. Coagulation Profile: No results for input(s): INR, PROTIME in the last 168 hours. Cardiac Enzymes: No results for input(s): CKTOTAL, CKMB, CKMBINDEX, TROPONINI in the last 168 hours. BNP (last 3 results) No results for input(s): PROBNP in the last 8760 hours. HbA1C: No results for input(s): HGBA1C in the last 72 hours. CBG: No results for input(s): GLUCAP in the last 168 hours. Lipid Profile: No results for input(s): CHOL, HDL, LDLCALC, TRIG, CHOLHDL, LDLDIRECT in the last 72 hours. Thyroid Function  Tests: No results for input(s): TSH, T4TOTAL, FREET4, T3FREE, THYROIDAB in the last 72 hours. Anemia Panel: No results for input(s): VITAMINB12, FOLATE, FERRITIN, TIBC, IRON, RETICCTPCT in the last 72 hours. Urine analysis:    Component Value Date/Time   COLORURINE YELLOW (A) 04/23/2019 1157   APPEARANCEUR HAZY (A) 04/23/2019 1157   LABSPEC 1.023 04/23/2019 1157   PHURINE 5.0 04/23/2019 1157   GLUCOSEU NEGATIVE 04/23/2019 1157   HGBUR NEGATIVE 04/23/2019 1157   BILIRUBINUR NEGATIVE 04/23/2019 1157   KETONESUR NEGATIVE 04/23/2019 1157   PROTEINUR 30 (A) 04/23/2019 1157   NITRITE NEGATIVE 04/23/2019 1157   LEUKOCYTESUR SMALL (A) 04/23/2019 1157   Sepsis Labs: @LABRCNTIP (procalcitonin:4,lacticidven:4) ) Recent Results (from the past 240 hour(s))  Respiratory Panel by RT PCR (Flu A&B, Covid) - Nasopharyngeal Swab     Status: None   Collection Time: 04/23/19  2:38 PM   Specimen: Nasopharyngeal Swab  Result Value Ref Range  Status   SARS Coronavirus 2 by RT PCR NEGATIVE NEGATIVE Final    Comment: (NOTE) SARS-CoV-2 target nucleic acids are NOT DETECTED. The SARS-CoV-2 RNA is generally detectable in upper respiratoy specimens during the acute phase of infection. The lowest concentration of SARS-CoV-2 viral copies this assay can detect is 131 copies/mL. A negative result does not preclude SARS-Cov-2 infection and should not be used as the sole basis for treatment or other patient management decisions. A negative result may occur with  improper specimen collection/handling, submission of specimen other than nasopharyngeal swab, presence of viral mutation(s) within the areas targeted by this assay, and inadequate number of viral copies (<131 copies/mL). A negative result must be combined with clinical observations, patient history, and epidemiological information. The expected result is Negative. Fact Sheet for Patients:  PinkCheek.be Fact Sheet for Healthcare Providers:  GravelBags.it This test is not yet ap proved or cleared by the [place] FDA and  has been authorized for detection and/or diagnosis of SARS-CoV-2 by FDA under an Emergency Use Authorization (EUA). This EUA will remain  in effect (meaning this test can be used) for the duration of the COVID-19 declaration under Section 564(b)(1) of the Act, 21 U.S.C. section 360bbb-3(b)(1), unless the authorization is terminated or revoked sooner.    Influenza A by PCR NEGATIVE NEGATIVE Final   Influenza B by PCR NEGATIVE NEGATIVE Final    Comment: (NOTE) The Xpert Xpress SARS-CoV-2/FLU/RSV assay is intended as an aid in  the diagnosis of influenza from Nasopharyngeal swab specimens and  should not be used as a sole basis for treatment. Nasal washings and  aspirates are unacceptable for Xpert Xpress SARS-CoV-2/FLU/RSV  testing. Fact Sheet for  Patients: PinkCheek.be Fact Sheet for Healthcare Providers: GravelBags.it This test is not yet approved or cleared by the [place] FDA and  has been authorized for detection and/or diagnosis of SARS-CoV-2 by  FDA under an Emergency Use Authorization (EUA). This EUA will remain  in effect (meaning this test can be used) for the duration of the  Covid-19 declaration under Section 564(b)(1) of the Act, 21  U.S.C. section 360bbb-3(b)(1), unless the authorization is  terminated or revoked. Performed at [facility], [address] [place], [place] [address]      Radiological Exams on Admission: CT Head Wo Contrast  Result Date: 04/23/2019 CLINICAL DATA:  Weakness EXAM: CT HEAD WITHOUT CONTRAST TECHNIQUE: Contiguous axial images were obtained from the base of the skull through the vertex without intravenous contrast. COMPARISON:  01/15/2019 FINDINGS: Brain: There is atrophy and chronic small vessel disease  changes. No acute intracranial abnormality. Specifically, no hemorrhage, hydrocephalus, mass lesion, acute infarction, or significant intracranial injury. Vascular: No hyperdense vessel or unexpected calcification. Skull: No acute calvarial abnormality. Sinuses/Orbits: Visualized paranasal sinuses and mastoids clear. Orbital soft tissues unremarkable. Other: None IMPRESSION: Atrophy, chronic microvascular disease. No acute intracranial abnormality. Electronically Signed   By: Rolm Baptise M.D.   On: 04/23/2019 11:46   DG Chest Port 1 View  Result Date: 04/23/2019 CLINICAL DATA:  Increasing weakness, fatigue, multiple falls over the past week, hypertension, breast cancer history EXAM: PORTABLE CHEST 1 VIEW COMPARISON:  Portable exam 1134 hours compared to 01/15/2019 FINDINGS: Enlargement of cardiac silhouette with pulmonary vascular congestion. Atherosclerotic calcification aorta. Hazy interstitial infiltrates bilaterally  question pulmonary edema versus multifocal infection. No pleural effusion or pneumothorax. Bones demineralized. IMPRESSION: BILATERAL pulmonary infiltrates question pulmonary edema versus multifocal infection. Electronically Signed   By: Lavonia Dana M.D.   On: 04/23/2019 11:42     EKG: Independently reviewed.  ?  Atrial fibrillation, QTc 465, nonspecific T wave change  Assessment/Plan Principal Problem:   Acute on chronic systolic CHF (congestive heart failure) (HCC) Active Problems:   CAD (coronary artery disease)   Atrial fibrillation, chronic (HCC)   Fall   HTN (hypertension)   HLD (hyperlipidemia)   UTI (urinary tract infection)   Acute on chronic systolic CHF (congestive heart failure) ([place]): 2D echo on 08/01/2017 showed EF of 30%.  Patient has 1+ leg edema, positive JVD, elevated BNP 7015, chest x-ray showed bilateral interstitial edema, clinically consistent with CHF exacerbation.  Patient received 1 dose of azithromycin due to concerning for pneumonia, but clinically patient does not seem to have pneumonia.  Will discontinue azithromycin.  -will admit to tele bed as inpt. -Lasix 20 mg bid by IV -Added 2.5 mg of lisinopril daily -trend trop -2d echo -Daily weights -strict I/O's -Low salt diet -Fluid restriction  CAD (coronary artery disease) and elevated trop: trop 25. No CP.  Patient possibly has a demand ischemia. -Aspirin, pravastatin, Imdur, metoprolol -Trend troponin -Aspirin -Check A1c, FLP -Repeat EKG in morning -Follow-up 2D echo  Atrial fibrillation, chronic ([place]): Not anticoagulate, possibly due to history of GI bleeding. -Continue Cardizem, metoprolol  Fall: Likely multifactorial etiology, including UTI, CHF exacerbation.  No focal neurologic deficit on physical examination.  CT head is negative. -PT OT  HTN:  -Continue home medications: Metoprolol, Cardizem, -hydralazine prn  HLD (hyperlipidemia) -Pravastatin  UTI (urinary tract infection): Patient  has increased urinary frequency and a positive urinalysis -Rocephin -Follow-up blood culture and urine culture     Inpatient status:  # Patient requires inpatient status due to high intensity of service, high risk for further deterioration and high frequency of surveillance required.  I certify that at the point of admission it is my clinical judgment that the patient will require inpatient hospital care spanning beyond 2 midnights from the point of admission.  . This patient has multiple chronic comorbidities including hypertension, hyperlipidemia, breast cancer (mastectomy bilateral), CHF with EF 30%, atrial fibrillation not on anticoagulants, GI bleeding, CAD . Now patient has presenting with acute on chronic systolic CHF and UTI . The worrisome physical exam findings include bilateral leg edema and positive JVD . The initial radiographic and laboratory data are worrisome because of elevated BNP 1715, elevated troponin, pulmonary interstitial edema on chest x-ray . Current medical needs: please see my assessment and plan . Predictability of an adverse outcome (risk): Patient has multiple comorbidities as listed above. Now presents with acute on  chronic systolic CHF and UTI. Patient's presentation is highly complicated.  Patient is at high risk of deteriorating.  Will need to be treated in hospital for at least 2 days.             DVT ppx: SQ Lovenox Code Status: DNR per her daughter Family Communication:  Yes, patient's daughter on phone  Disposition Plan:  Anticipate discharge back to previous home environment Consults called:  none Admission status:  Tele bed as inpt      Date of Service 04/23/2019    [place] [facility]   If 7PM-7AM, please contact night-coverage [email or web address] 04/23/2019, 6:19 PM

## 2019-04-23 NOTE — Progress Notes (Signed)
Pt admitted to 2A, pt is disoriented to time and place, easily reoriented. Is able to answer admission questions appropriately, pt was changed into gown and no other needs expressed

## 2019-04-23 NOTE — ED Notes (Signed)
In and out cath performed with Mel NT

## 2019-04-23 NOTE — ED Notes (Signed)
Assisted pt to restroom, unable to provide urine sample at this time

## 2019-04-24 ENCOUNTER — Inpatient Hospital Stay
Admit: 2019-04-24 | Discharge: 2019-04-24 | Disposition: A | Discharge status: Home / Self Care | Payer: Medicare Other | Attending: Internal Medicine | Admitting: Internal Medicine

## 2019-04-24 ENCOUNTER — Inpatient Hospital Stay: Payer: Medicare Other

## 2019-04-24 DIAGNOSIS — J9601 Acute respiratory failure with hypoxia: Secondary | ICD-10-CM

## 2019-04-24 DIAGNOSIS — R001 Bradycardia, unspecified: Secondary | ICD-10-CM

## 2019-04-24 DIAGNOSIS — I10 Essential (primary) hypertension: Secondary | ICD-10-CM

## 2019-04-24 DIAGNOSIS — R296 Repeated falls: Secondary | ICD-10-CM

## 2019-04-24 DIAGNOSIS — E785 Hyperlipidemia, unspecified: Secondary | ICD-10-CM

## 2019-04-24 DIAGNOSIS — I509 Heart failure, unspecified: Secondary | ICD-10-CM

## 2019-04-24 LAB — HEMOGLOBIN A1C
Hgb A1c MFr Bld: 5.4 % (ref 4.8–5.6)
Mean Plasma Glucose: 108.28 mg/dL

## 2019-04-24 LAB — BASIC METABOLIC PANEL
Anion gap: 7 (ref 5–15)
BUN: 30 mg/dL — ABNORMAL HIGH (ref 8–23)
CO2: 27 mmol/L (ref 22–32)
Calcium: 9.5 mg/dL (ref 8.9–10.3)
Chloride: 106 mmol/L (ref 98–111)
Creatinine, Ser: 0.86 mg/dL (ref 0.44–1.00)
GFR calc Af Amer: >60 mL/min (ref >60)
GFR calc non Af Amer: 58 mL/min — ABNORMAL LOW (ref >60)
Glucose, Bld: 93 mg/dL (ref 70–99)
Potassium: 4.6 mmol/L (ref 3.5–5.1)
Sodium: 140 mmol/L (ref 135–145)

## 2019-04-24 LAB — LIPID PANEL
Cholesterol: 74 mg/dL (ref 0–200)
HDL: 38 mg/dL — ABNORMAL LOW (ref >40)
LDL Cholesterol: 26 mg/dL (ref 0–99)
Total CHOL/HDL Ratio: 1.9 RATIO
Triglycerides: 48 mg/dL (ref <150)
VLDL: 10 mg/dL (ref 0–40)

## 2019-04-24 LAB — ECHOCARDIOGRAM COMPLETE
Height: 62 in
Weight: 1886.4 oz

## 2019-04-24 LAB — TROPONIN I (HIGH SENSITIVITY): Troponin I (High Sensitivity): 24 ng/L — ABNORMAL HIGH (ref <18)

## 2019-04-24 IMAGING — MR MR HEAD W/O CM
10 series · 44 of 48 positions shown · non-contrast
Comparison: Head CT yesterday.

CLINICAL DATA: Atrial fibrillation. Generalized weakness and
falling. Ataxia.

EXAM:
MRI HEAD WITHOUT CONTRAST
TECHNIQUE: Multiplanar, multiecho pulse sequences of the brain and surrounding
structures were obtained without intravenous contrast.

[Series 5: ax dwi_tracew · axial · 3.0mm · 0.60mm/px · z∈[-114,+38]mm · 5 of 48 slices shown]
[im 1/48]
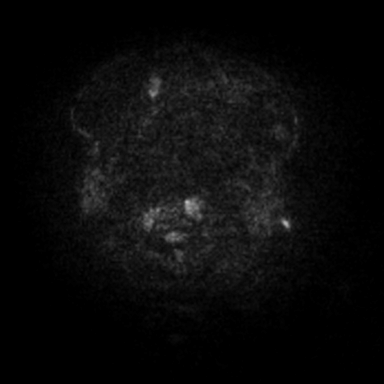
[im 12/48]
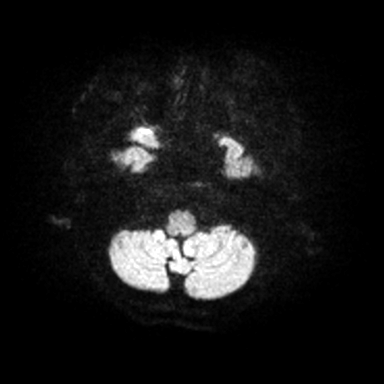
[im 24/48]
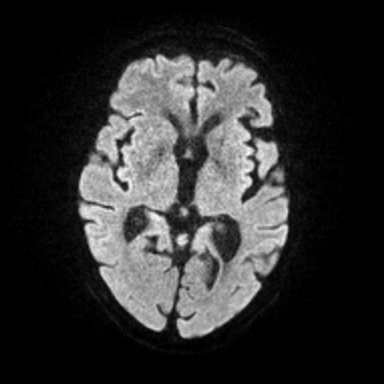
[im 36/48]
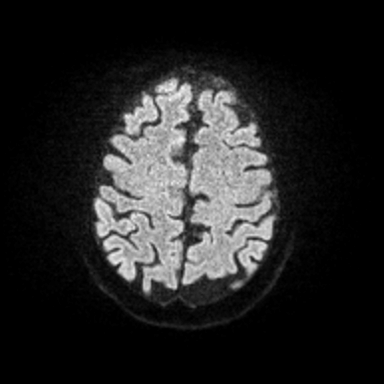
[im 48/48]
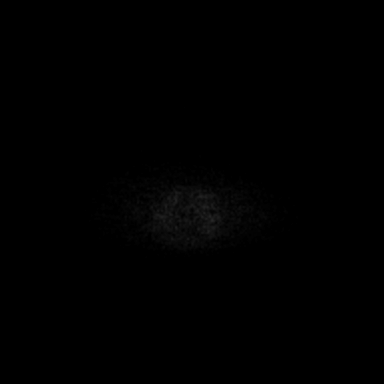

[Series 6: ax dwi_adc · axial · 3.0mm · 0.60mm/px · z∈[-114,+38]mm · 4 of 48 slices shown]
[im 1/48]
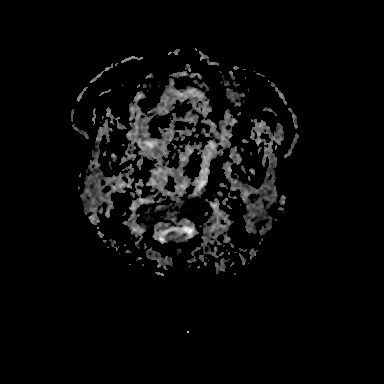
[im 16/48]
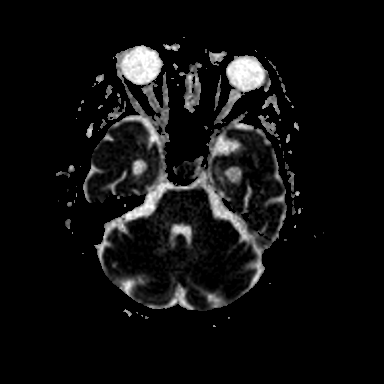
[im 32/48]
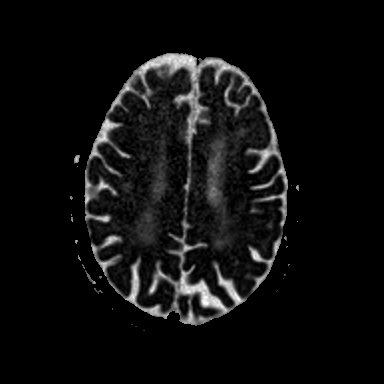
[im 48/48]
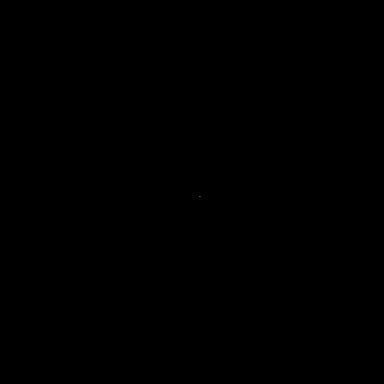

[Series 7: cor dwi_tracew · coronal · 5.0mm · 0.60mm/px · 3 of 40 slices shown]
[im 1/40]
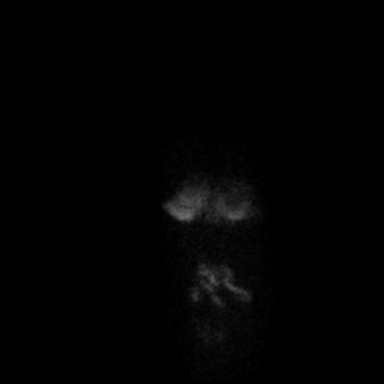
[im 20/40]
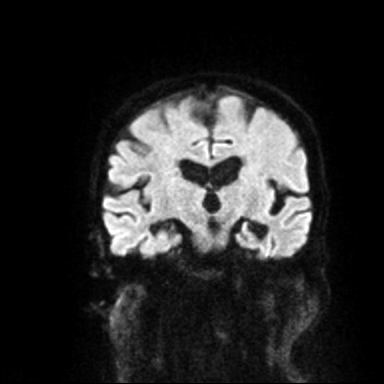
[im 40/40]
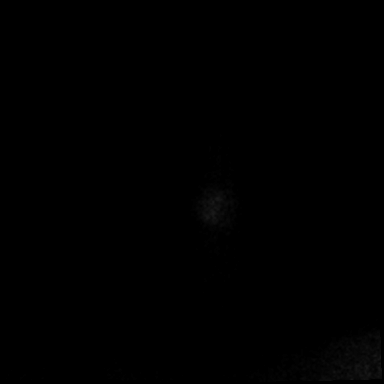

[Series 8: cor dwi_adc · coronal · 5.0mm · 0.60mm/px · 3 of 38 slices shown]
[im 1/38]
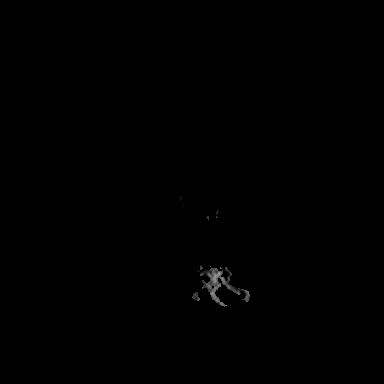
[im 19/38]
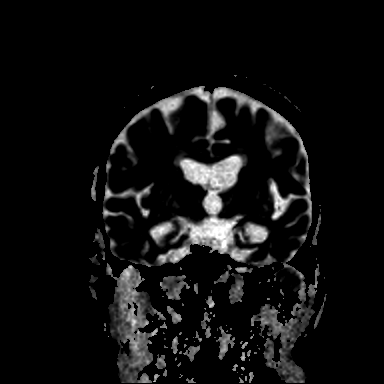
[im 38/38]
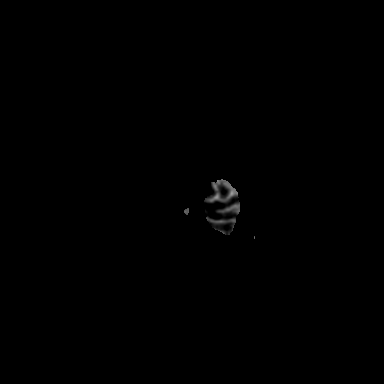

[Series 10: T2 · axial · 5.0mm · 0.53mm/px · z∈[-117,+21]mm · 2 of 25 slices shown (1 of 2)]
[im 1/25]
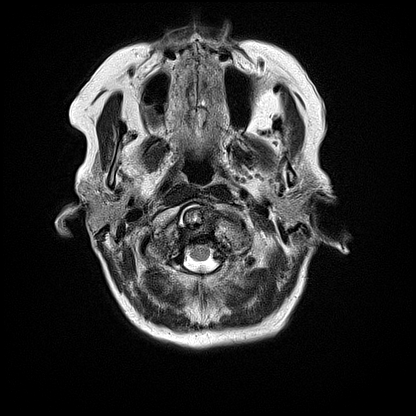
[im 25/25]
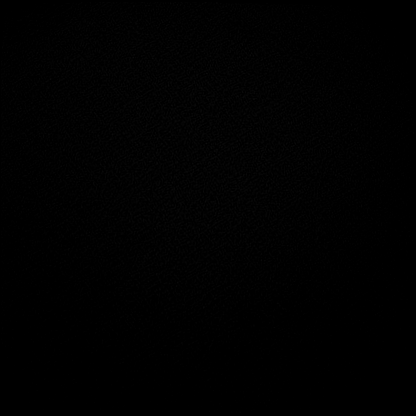

[Series 12: pha_images · axial · 3.0mm · 0.90mm/px · z∈[-132,+26]mm · 4 of 52 slices shown]
[im 1/52]
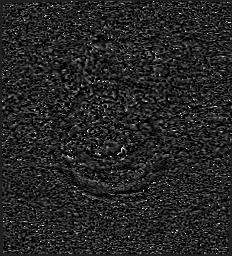
[im 18/52]
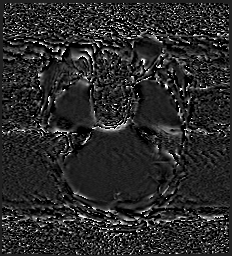
[im 35/52]
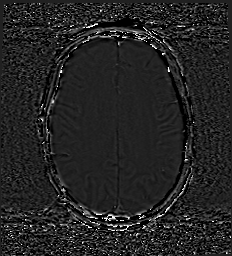
[im 52/52]
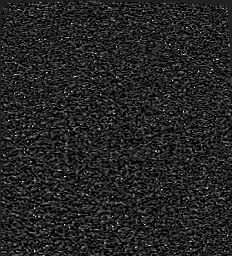

[Series 13: swi_images · axial · 3.0mm · 0.90mm/px · z∈[-135,+35]mm · 5 of 60 slices shown]
[im 1/60]
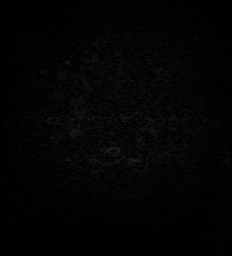
[im 15/60]
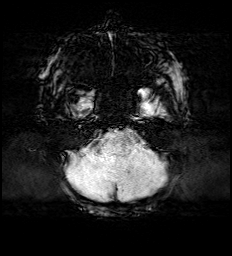
[im 30/60]
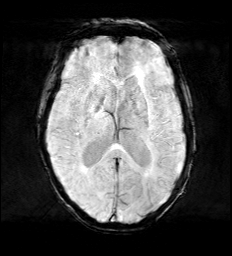
[im 45/60]
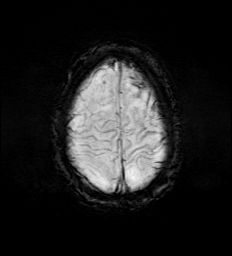
[im 60/60]
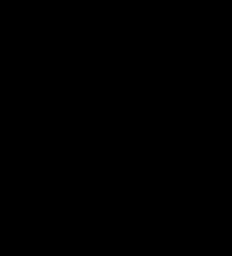

[Series 15: FLAIR · axial · 3.0mm · 0.53mm/px · z∈[-126,+29]mm · 5 of 55 slices shown]
[im 1/55]
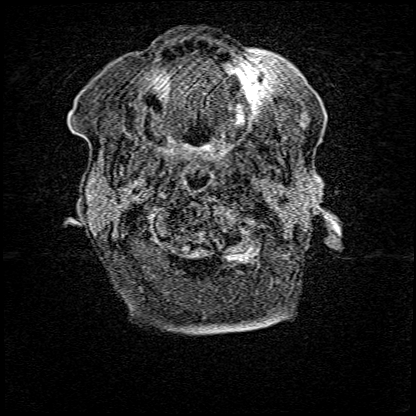
[im 14/55]
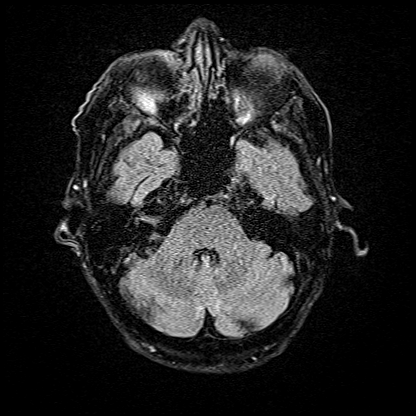
[im 28/55]
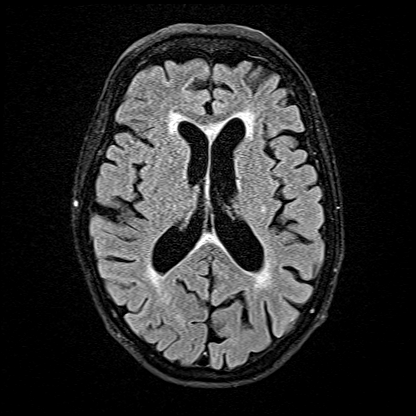
[im 41/55]
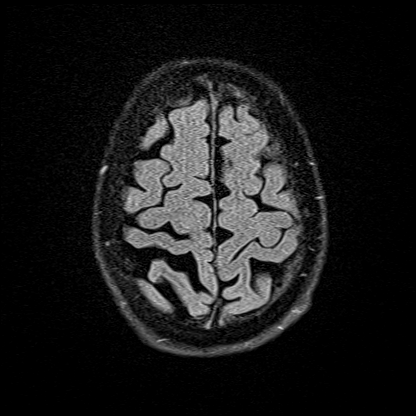
[im 55/55]
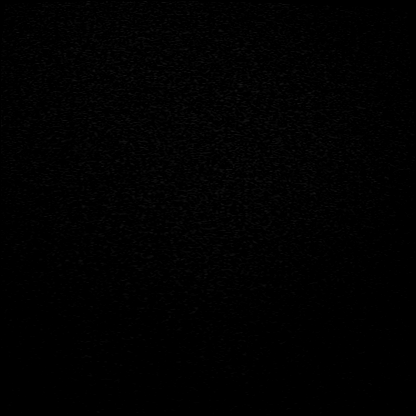

[Series 16: T1 · axial · 1.0mm · 0.98mm/px · z∈[-135,+32]mm · 11 of 175 slices shown]
[im 1/175]
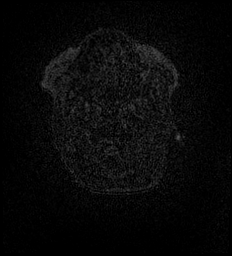
[im 13/175]
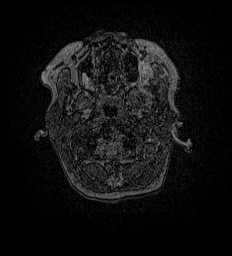
[im 25/175]
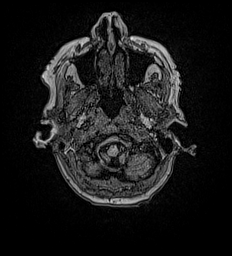
[im 38/175]
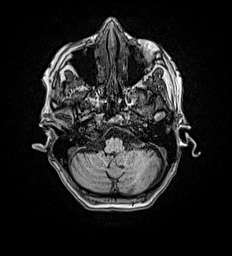
[im 50/175]
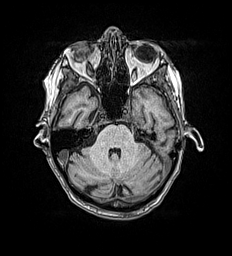
[im 75/175]
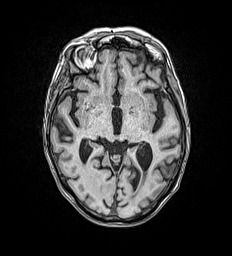
[im 88/175]
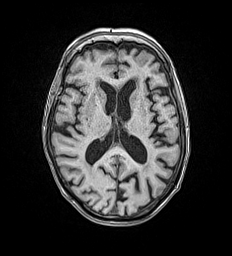
[im 100/175]
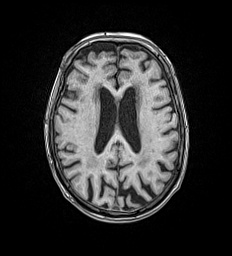
[im 125/175]
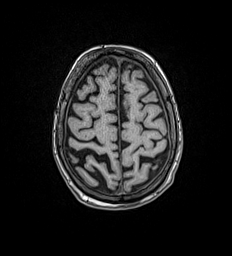
[im 150/175]
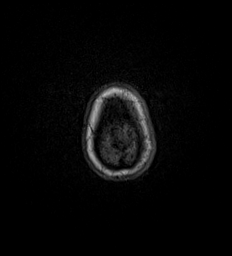
[im 175/175]
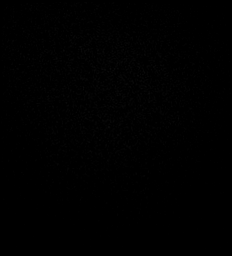

[Series 17: T2 · coronal · 5.0mm · 0.57mm/px · 2 of 29 slices shown (2 of 2)]
[im 1/29]
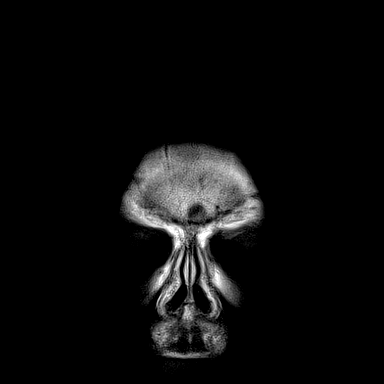
[im 29/29]
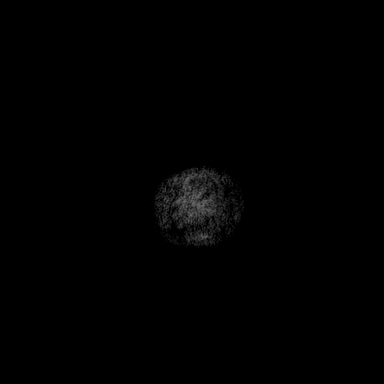

[44 of 48 positions shown; findings below may reference images not displayed]

FINDINGS: Brain: Diffusion imaging does not show any acute or subacute
infarction. The brainstem is normal. No focal cerebellar insult.
Cerebral hemispheres show age related volume loss with mild chronic
small-vessel ischemic change of the white matter considering age. No
cortical or large vessel territory infarction. No mass lesion,
hemorrhage, hydrocephalus or extra-axial collection.

Vascular: Major vessels at the base of the brain show flow.

Skull and upper cervical spine: Negative

Sinuses/Orbits: Clear/normal

Other: None
IMPRESSION: No acute finding. Age related volume loss. Mild for age chronic
small-vessel ischemic changes of the white matter.

## 2019-04-24 MED ORDER — METOPROLOL TARTRATE 50 MG PO TABS
50.0000 mg | ORAL_TABLET | Freq: Two times a day (BID) | ORAL | Status: DC
  Administered 2019-04-24 – 2019-04-25 (×2): 50 mg via ORAL
  Filled 2019-04-24 (×2): qty 1

## 2019-04-24 MED ORDER — IPRATROPIUM-ALBUTEROL 0.5-2.5 (3) MG/3ML IN SOLN
3.0000 mL | Freq: Three times a day (TID) | RESPIRATORY_TRACT | Status: DC
  Administered 2019-04-25: 3 mL via RESPIRATORY_TRACT
  Filled 2019-04-24 (×2): qty 3

## 2019-04-24 MED ORDER — ENOXAPARIN SODIUM 40 MG/0.4ML ~~LOC~~ SOLN
40.0000 mg | SUBCUTANEOUS | Status: DC
  Administered 2019-04-24: 40 mg via SUBCUTANEOUS
  Filled 2019-04-24: qty 0.4

## 2019-04-24 MED ORDER — AZITHROMYCIN 250 MG PO TABS
250.0000 mg | ORAL_TABLET | Freq: Every day | ORAL | Status: DC
  Administered 2019-04-24 – 2019-04-25 (×2): 250 mg via ORAL
  Filled 2019-04-24 (×2): qty 1

## 2019-04-24 NOTE — Plan of Care (Signed)
  Problem: Activity: Goal: Capacity to carry out activities will improve Outcome: Not Progressing  Patient unsafe to transfer independently. Refuses to utilize call bell and refused to acknowledge importance of compliance.

## 2019-04-24 NOTE — Progress Notes (Signed)
*  PRELIMINARY RESULTS* Echocardiogram 2D Echocardiogram has been performed.  Sheryl Suarez 04/24/2019, 10:02 AM

## 2019-04-24 NOTE — NC FL2 (Signed)
[place] LEVEL OF CARE SCREENING TOOL     IDENTIFICATION  Patient Name: Sheryl Suarez Birthdate: 1923-03-29 Sex: female Admission Date (Current Location): 04/23/2019  [place] and [place] Number:  Engineering geologist and Address:  [facility], [address], [place], [place] [address]      Provider Number: [id number]  Attending Physician Name and Address:  Loletha Grayer, MD  Relative Name and Phone Number:       Current Level of Care: Hospital Recommended Level of Care: [place] Prior Approval Number:    Date Approved/Denied:   PASRR Number: [id number]  Discharge Plan: SNF    Current Diagnoses: Patient Active Problem List   Diagnosis Date Noted  . HTN (hypertension) 04/23/2019  . HLD (hyperlipidemia) 04/23/2019  . UTI (urinary tract infection) 04/23/2019  . Closed fracture of right olecranon process   . Fall   . Generalized weakness   . Closed fracture dislocation of right elbow 01/15/2019  . Closed rib fracture 01/15/2019  . HTN (hypertension), benign 01/15/2019  . Acute on chronic systolic CHF (congestive heart failure) ([place]) 01/15/2019  . CAD (coronary artery disease) 01/15/2019  . Atrial fibrillation, chronic ([place]) 01/15/2019  . Rhabdomyolysis 01/15/2019  . GI bleed 08/18/2015    Orientation RESPIRATION BLADDER Height & Weight     Self, Time, Situation, Place  Normal Continent Weight: 117 lb 14.4 oz (53.5 kg) Height:  5\' 2"  (157.5 cm)  BEHAVIORAL SYMPTOMS/MOOD NEUROLOGICAL BOWEL NUTRITION STATUS      Continent Diet(Heart healthy,Fluid restriction: 1500 mL Fluid)  AMBULATORY STATUS COMMUNICATION OF NEEDS Skin   Limited Assist Verbally Normal                       Personal Care Assistance Level of Assistance  Bathing, Feeding, Dressing Bathing Assistance: Limited assistance Feeding assistance: Independent Dressing Assistance: Limited assistance     Functional Limitations Info  Sight,  Speech, Hearing Sight Info: Adequate Hearing Info: Adequate Speech Info: Adequate    SPECIAL CARE FACTORS FREQUENCY  PT (By licensed PT), OT (By licensed OT)     PT Frequency: 5x OT Frequency: 5x            Contractures Contractures Info: Not present    Additional Factors Info  Code Status, Allergies Code Status Info: DNR Allergies Info: NO known allergies           Current Medications (04/24/2019):  This is the current hospital active medication list Current Facility-Administered Medications  Medication Dose Route Frequency Provider Last Rate Last Admin  . 0.9 %  sodium chloride infusion  250 mL Intravenous PRN Ivor Costa, MD      . acetaminophen (TYLENOL) tablet 650 mg  650 mg Oral Q4H PRN Ivor Costa, MD      . albuterol (PROVENTIL) (2.5 MG/3ML) 0.083% nebulizer solution 3 mL  3 mL Inhalation Q4H PRN Ivor Costa, MD      . aspirin EC tablet 81 mg  81 mg Oral Daily Ivor Costa, MD   81 mg at 04/24/19 0853  . calcium-vitamin D (OSCAL WITH D) 500-200 MG-UNIT per tablet 1 tablet  1 tablet Oral Daily Ivor Costa, MD   1 tablet at 04/24/19 0854  . cefTRIAXone (ROCEPHIN) 1 g in sodium chloride 0.9 % 100 mL IVPB  1 g Intravenous Q24H Ivor Costa, MD      . dextromethorphan-guaiFENesin [facility] DM) 30-600 MG per 12 hr tablet 1 tablet  1 tablet Oral BID  Ivor Costa, MD   1 tablet at 04/24/19 [phone]  . diltiazem (CARDIZEM CD) 24 hr capsule 120 mg  120 mg Oral Daily Ivor Costa, MD   120 mg at 04/24/19 0853  . enoxaparin (LOVENOX) injection 40 mg  40 mg Subcutaneous Q24H Eleonore Chiquito S, RPH      . furosemide (LASIX) injection 20 mg  20 mg Intravenous BID Ivor Costa, MD   20 mg at 04/24/19 0853  . hydrALAZINE (APRESOLINE) tablet 25 mg  25 mg Oral TID PRN Ivor Costa, MD      . ipratropium-albuterol (DUONEB) 0.5-2.5 (3) MG/3ML nebulizer solution 3 mL  3 mL Inhalation Q6H Ivor Costa, MD   3 mL at 04/24/19 0731  . isosorbide mononitrate (IMDUR) 24 hr tablet 30 mg  30 mg Oral Daily Ivor Costa, MD    30 mg at 04/24/19 [id number]  . lisinopril (ZESTRIL) tablet 2.5 mg  2.5 mg Oral Daily Ivor Costa, MD   2.5 mg at 04/24/19 0854  . metoprolol tartrate (LOPRESSOR) tablet 100 mg  100 mg Oral BID Ivor Costa, MD   100 mg at 04/24/19 0853  . ondansetron (ZOFRAN) injection 4 mg  4 mg Intravenous Q6H PRN Ivor Costa, MD      . pravastatin (PRAVACHOL) tablet 40 mg  40 mg Oral q1800 Ivor Costa, MD      . sodium chloride flush (NS) 0.9 % injection 3 mL  3 mL Intravenous Q12H Ivor Costa, MD   3 mL at 04/24/19 0854  . sodium chloride flush (NS) 0.9 % injection 3 mL  3 mL Intravenous PRN Ivor Costa, MD         Discharge Medications: Please see discharge summary for a list of discharge medications.  Relevant Imaging Results:  Relevant Lab Results:   Additional Information [id number]  Eileen Stanford, LCSW

## 2019-04-24 NOTE — Evaluation (Signed)
Occupational Therapy Evaluation Patient Details Name: Sheryl Suarez MRN: [id number] DOB: 1923-05-17 Today's Date: 04/24/2019    History of Present Illness 86 y.o. female with medical history significant of hypertension, hyperlipidemia, breast cancer (mastectomy bilateral), CHF with EF 30%, atrial fibrillation not on anticoagulants, GI bleeding, CAD, who presents with generalized weakness, fall (at least 7 times), shortness of breath. Workup showed bilateral interstitial pulmonary edema, LE edema, admitted for CHF exacerbation, UTI. Of note, pt recently broke her elbow (~3 months ago) had her cast removed one week ago per patient, stated she has been using her walker.   Clinical Impression   Pt was seen for OT evaluation this date. Prior to hospital admission, pt was Indep with BADLs-sponge bathing in sitting for safety, and her dtr assisted with IADLs such as med mgt and meals. Pt lives in [facility] with ramped entrance and pt was primarily using RW for fxl mobiltiy. Currently pt demonstrates impairments as described below (See OT problem list) which functionally limit her ability to perform ADL/self-care tasks. Pt currently requires setup to MIN A with seated UB ADLs, and MOD A with seated LB ADLs. Pt too fatigued to attempt transfers on OT assessment, but is currently requiring MIN A for transfers per physical therapist.  OT facilitates education with pt and her daughter re: seated core exercsie, role of OT, and some potential d/c recommendations including f/u with therapy after acute stay and removal of fall hazards in the home. Pt would benefit from skilled OT to address noted impairments and functional limitations (see below for any additional details) in order to maximize safety and independence while minimizing falls risk and caregiver burden. Upon hospital discharge, recommend STR to maximize pt safety and return to PLOF.     Follow Up Recommendations  SNF    Equipment Recommendations  3 in 1  bedside commode    Recommendations for Other Services       Precautions / Restrictions Precautions Precautions: Fall Restrictions Weight Bearing Restrictions: No      Mobility Bed Mobility         General bed mobility comments: pt received sitting up in recliner when OT presents.  Transfers         General transfer comment: Pt fatigued from t/f's with PT this date, per PT note, pt requires MIN A wtih transfers.    Balance Overall balance assessment: Needs assistance Sitting-balance support: Feet supported Sitting balance-Leahy Scale: Fair       Standing balance-Leahy Scale: Poor Standing balance comment: pt reliant on RW                           ADL either performed or assessed with clinical judgement   ADL Overall ADL's : Needs assistance/impaired                                       General ADL Comments: Pt requires MOD A with LB dressing seated in recliner, requires MIN A with seated grooming/self feeding from BST d/t limited use of R UE. Unable to assess ADL transfers on OT evaluation, Pt just finished PT prior. Per PT note, pt MIN A with transfers/fxl mobility     Vision Patient Visual Report: No change from baseline       Perception     Praxis      Pertinent Vitals/Pain Pain Assessment: No/denies pain  Hand Dominance Right   Extremity/Trunk Assessment Upper Extremity Assessment Upper Extremity Assessment: RUE deficits/detail;LUE deficits/detail RUE Deficits / Details: shld flex/abd 1/3 arc of motion 3-/5,  elbow WFL, grossly 3+/5, grip 3+/5 RUE Sensation: decreased light touch RUE Coordination: decreased fine motor LUE Deficits / Details: ROM WFL, shld, elbow, grip 3+/5   Lower Extremity Assessment Lower Extremity Assessment: Defer to PT evaluation;Generalized weakness       Communication Communication Communication: HOH   Cognition Arousal/Alertness: Awake/alert Behavior During Therapy: WFL for tasks  assessed/performed Overall Cognitive Status: Within Functional Limits for tasks assessed                                     General Comments       Exercises Other Exercises Other Exercises: OT facilitates improved seated positioning through lumbar support with pillow as pt c/o low back pain. Pt states this does feel more comfortable. Other Exercises: OT facilitaes education with pt and her daughter that presented halfway through session, re: role of OT in acute setting and also, need for OT f/u upon d/c from hospital/acute setting. Both parties agreeable to f/u, but pt preferring to go home. Other Exercises: OT facilitaes education with pt and daughter re: seated UB and core exercise that pt can complete with no equipment outside of therapy time including postural extension to increase surface area for breathing as well as modified chair crunches and lateral reaching oblique twists. Pt demos good understanding, but requries reinforcement.   Shoulder Instructions      Home Living Family/patient expects to be discharged to:: Private residence Living Arrangements: Alone Available Help at Discharge: Family;Available PRN/intermittently Type of Home: House Home Access: Ramped entrance     Home Layout: One level     Bathroom Shower/Tub: Occupational psychologist: Standard Bathroom Accessibility: Yes   Home Equipment: Cane - single point;Walker - 2 wheels;Grab bars - toilet          Prior Functioning/Environment    Gait / Transfers Assistance Needed: Pt has been utilizing RW at home since she broke her elbow. ADL's / Homemaking Assistance Needed: cooking, cleaning, medication management performed by daughter. pt able to dress with extended time, bird bath independently   Comments: Patient has extended falls, at least 7 prior to admission.        OT Problem List: Decreased strength;Decreased range of motion;Decreased activity tolerance;Impaired balance  (sitting and/or standing);Decreased coordination;Decreased knowledge of use of DME or AE;Impaired UE functional use      OT Treatment/Interventions: Self-care/ADL training;Therapeutic exercise;Energy conservation;DME and/or AE instruction;Therapeutic activities;Patient/family education;Balance training    OT Goals(Current goals can be found in the care plan section) Acute Rehab OT Goals Patient Stated Goal: to get stronger OT Goal Formulation: With patient Time For Goal Achievement: 05/08/19 Potential to Achieve Goals: Good  OT Frequency: Min 2X/week   Barriers to D/C: Decreased caregiver support          Co-evaluation              AM-PAC OT "6 Clicks" Daily Activity     Outcome Measure Help from another person eating meals?: A Little Help from another person taking care of personal grooming?: A Little Help from another person toileting, which includes using toliet, bedpan, or urinal?: A Lot Help from another person bathing (including washing, rinsing, drying)?: A Lot Help from another person to put on and taking off regular  upper body clothing?: A Little Help from another person to put on and taking off regular lower body clothing?: A Lot 6 Click Score: 15   End of Session Nurse Communication: Mobility status;Other (comment)(RN, Amy, notified that OT re-checked O2 on Room air for f/u as PT had removed in earlier session and notd to be 93% or greater throughout session. nasal cannula left off pt.)  Activity Tolerance: Patient tolerated treatment well Patient left: in chair;with call bell/phone within reach;with chair alarm set  OT Visit Diagnosis: Unsteadiness on feet (R26.81);Muscle weakness (generalized) (M62.81)                Time: [id number] OT Time Calculation (min): 31 min Charges:  OT General Charges $OT Visit: 1 Visit OT Evaluation $OT Eval Moderate Complexity: 1 Mod OT Treatments $Self Care/Home Management : 8-22 mins  Gerrianne Scale, MS, OTR/L ascom  [phone] 04/24/19, 2:32 PM

## 2019-04-24 NOTE — Progress Notes (Signed)
PT Cancellation Note  Patient Details Name: Sheryl Suarez MRN: [id number] DOB: 09/23/1923   Cancelled Treatment:    Reason Eval/Treat Not Completed: Other (comment)(Pt currently with bedside procedure. PT to follow up as able.)  Lieutenant Diego PT, DPT 9:34 AM,04/24/19

## 2019-04-24 NOTE — Evaluation (Signed)
Physical Therapy Evaluation Patient Details Name: Sheryl Suarez MRN: [id number] DOB: 07-30-1923 Today's Date: 04/24/2019   History of Present Illness  86 y.o. female with medical history significant of hypertension, hyperlipidemia, breast cancer (mastectomy bilateral), CHF with EF 30%, atrial fibrillation not on anticoagulants, GI bleeding, CAD, who presents with generalized weakness, fall (at least 7 times), shortness of breath. Workup showed bilateral interstitial pulmonary edema, LE edema, admitted for CHF exacerbation, UTI. Of note, pt recently broke her elbow (~3 months ago) had her cast removed one week ago per patient, stated she has been using her walker.    Clinical Impression  Pt alert, oriented, behavior WFLs. Pt is motivated to "take care of her needs" on her own as much as possible. Stated she lives alone but her daughter comes over everyday, and if she doesn't, then she calls. Daughter assists with cooking/cleaning/medication management, pt stated she is able to get dressed and take bird baths independently but it takes her a long time. Reported at least 7 falls prior to admission and feels she has gotten increasingly week in the last several months.  The patient was able to lift all extremities against gravity, RUE limited more than LUE. Pt performed supine to sit with supervision with HOB elevated, and use of bed rails, extended time needed. Pt exhibited fair sitting balance, and was able to scoot anteriorly with PT stabilizing RW. Sit <> stand with RW and minA for steadying, pt needed momentum to complete transfer. Pt took several steps towards recliner with RW and minA, exhibited mild SOB and complained of fatigue, declined further mobility at this time. Pt did have one posterior LOB noted PT needed to correct. PT also assited patient to brush her teeth upon request.  Overall the patient demonstrated deficits (see "PT Problem List") that impede the patient's functional abilities, safety,  and mobility and would benefit from skilled PT intervention. Recommendation is SNF due to acute decline in functional status, and decreased caregiver support.  Of note, PT acquired RN consent to trial pt on room air, throughout session >90%, in chair at rest 90-91%, RN notified.     Follow Up Recommendations SNF    Equipment Recommendations  3in1 (PT)    Recommendations for Other Services OT consult     Precautions / Restrictions Precautions Precautions: Fall      Mobility  Bed Mobility Overal bed mobility: Needs Assistance Bed Mobility: Supine to Sit     Supine to sit: HOB elevated;Supervision     General bed mobility comments: extensive use of bed rails, extended time needed  Transfers Overall transfer level: Needs assistance Equipment used: Rolling walker (2 wheeled) Transfers: Sit to/from Stand Sit to Stand: Min assist         General transfer comment: minA from [place], pt reliant on UE to complete transfers, cued for hand placement  Ambulation/Gait Ambulation/Gait assistance: Min assist Gait Distance (Feet): 2 Feet Assistive device: Rolling walker (2 wheeled)   Gait velocity: decreased   General Gait Details: Pt able to take shuffled steps to recliner in room. one posterior LOB noted, minA to correct. reliant on RW  Stairs            Wheelchair Mobility    Modified Rankin (Stroke Patients Only)       Balance Overall balance assessment: Needs assistance Sitting-balance support: Feet supported Sitting balance-Leahy Scale: Fair       Standing balance-Leahy Scale: Poor Standing balance comment: pt reliant on RW  Pertinent Vitals/Pain Pain Assessment: No/denies pain    Home Living Family/patient expects to be discharged to:: Private residence Living Arrangements: Alone Available Help at Discharge: Family;Available PRN/intermittently Type of Home: House Home Access: Ramped entrance     Home  Layout: One level Home Equipment: Cane - single point;Walker - 2 wheels;Grab bars - toilet      Prior Function Level of Independence: Needs assistance   Gait / Transfers Assistance Needed: Pt has been utilizing RW at home since she broke her elbow.  ADL's / Homemaking Assistance Needed: cooking, cleaning, medication management performed by daughter. pt able to dress with extended time, bird bath independently  Comments: Patient has extended falls, at least 7 prior to admission.     Hand Dominance   Dominant Hand: Right    Extremity/Trunk Assessment   Upper Extremity Assessment Upper Extremity Assessment: Generalized weakness;Defer to OT evaluation    Lower Extremity Assessment Lower Extremity Assessment: Generalized weakness       Communication   Communication: HOH  Cognition Arousal/Alertness: Awake/alert Behavior During Therapy: WFL for tasks assessed/performed Overall Cognitive Status: Within Functional Limits for tasks assessed                                        General Comments      Exercises Other Exercises Other Exercises: Pt educated on PT role, expectations, discharge recommendations/options Other Exercises: PT assisted pt to brush teeth. Pt needed set up assistance, total assistance to put toothpaste on tooth brush, and minA for cleanup.   Assessment/Plan    PT Assessment Patient needs continued PT services  PT Problem List Decreased strength;Decreased mobility;Decreased activity tolerance;Decreased balance;Decreased knowledge of use of DME       PT Treatment Interventions Therapeutic exercise;DME instruction;Gait training;Balance training;Stair training;Functional mobility training;Neuromuscular re-education;Therapeutic activities;Patient/family education    PT Goals (Current goals can be found in the Care Plan section)  Acute Rehab PT Goals Patient Stated Goal: to get stronger PT Goal Formulation: With patient Time For Goal  Achievement: 05/08/19 Potential to Achieve Goals: Good    Frequency Min 2X/week   Barriers to discharge Decreased caregiver support      Co-evaluation               AM-PAC PT "6 Clicks" Mobility  Outcome Measure Help needed turning from your back to your side while in a flat bed without using bedrails?: A Little Help needed moving from lying on your back to sitting on the side of a flat bed without using bedrails?: A Little Help needed moving to and from a bed to a chair (including a wheelchair)?: A Little Help needed standing up from a chair using your arms (e.g., wheelchair or bedside chair)?: A Little Help needed to walk in hospital room?: A Little Help needed climbing 3-5 steps with a railing? : A Little 6 Click Score: 18    End of Session Equipment Utilized During Treatment: Gait belt Activity Tolerance: Patient limited by fatigue Patient left: in chair;with call bell/phone within reach;Other (comment)(chair alarm pad placed under pt in recliner, no chair alarm box to plug into. CNA notified) Nurse Communication: Mobility status;Other (comment)(oxygen status) PT Visit Diagnosis: Other abnormalities of gait and mobility (R26.89);Muscle weakness (generalized) (M62.81);Repeated falls (R29.6);History of falling (Z91.81);Difficulty in walking, not elsewhere classified (R26.2)    Time: 1019-1110 PT Time Calculation (min) (ACUTE ONLY): 51 min   Charges:   PT  Evaluation $PT Eval Moderate Complexity: 1 Mod PT Treatments $Therapeutic Exercise: 8-22 mins $Therapeutic Activity: 8-22 mins        Lieutenant Diego PT, DPT 12:17 PM,04/24/19

## 2019-04-24 NOTE — TOC Initial Note (Signed)
Transition of Care [facility]) - Initial/Assessment Note    Patient Details  Name: Sheryl Suarez MRN: [id number] Date of Birth: 1923-08-07  Transition of Care [facility]) CM/SW Contact:    Eileen Stanford, LCSW Phone Number: 04/24/2019, 1:59 PM  Clinical Narrative:  CSW spoke with pt's daughter. Pt's daughter present at bedside. Pt's daughter is ok with pt going to snf but would like for pt to go to [place]--due to location. CSW has reached out to Claudine Mouton are reviewing referral.             Expected Discharge Plan: [place] Barriers to Discharge: Continued Medical Work up   Patient Goals and CMS Choice Patient states their goals for this hospitalization and ongoing recovery are:: to get stronger   Choice offered to / list presented to : Adult Children  Expected Discharge Plan and Services Expected Discharge Plan: [place] In-house Referral: Clinical Social Work   Post Acute Care Choice: [place] Living arrangements for the past 2 months: [place]                                      Prior Living Arrangements/Services Living arrangements for the past 2 months: Single Family Home Lives with:: Self Patient language and need for interpreter reviewed:: Yes Do you feel safe going back to the place where you live?: Yes      Need for Family Participation in Patient Care: Yes (Comment) Care giver support system in place?: Yes (comment)   Criminal Activity/Legal Involvement Pertinent to Current Situation/Hospitalization: No - Comment as needed  Activities of Daily Living Home Assistive Devices/Equipment: Walker (specify type) ADL Screening (condition at time of admission) Patient's cognitive ability adequate to safely complete daily activities?: Yes Is the patient deaf or have difficulty hearing?: Yes Does the patient have difficulty seeing, even when wearing glasses/contacts?: No Does the patient have difficulty  concentrating, remembering, or making decisions?: Yes Patient able to express need for assistance with ADLs?: Yes Does the patient have difficulty dressing or bathing?: Yes Independently performs ADLs?: No Communication: Independent Dressing (OT): Needs assistance Is this a change from baseline?: Pre-admission baseline Grooming: Needs assistance Is this a change from baseline?: Pre-admission baseline Feeding: Independent Bathing: Needs assistance Is this a change from baseline?: Pre-admission baseline Toileting: Needs assistance Is this a change from baseline?: Pre-admission baseline In/Out Bed: Needs assistance Is this a change from baseline?: Pre-admission baseline Walks in Home: Independent with device (comment) Does the patient have difficulty walking or climbing stairs?: Yes Weakness of Legs: Both Weakness of Arms/Hands: Right  Permission Sought/Granted Permission sought to share information with : Family Supports    Share Information with NAME: [name]  Permission granted to share info w AGENCY: [place] granted to share info w Relationship: daughter     Emotional Assessment Appearance:: Appears stated age Attitude/Demeanor/Rapport: Engaged Affect (typically observed): Accepting, Appropriate, Calm Orientation: : Oriented to Self, Oriented to Place, Oriented to  Time, Oriented to Situation Alcohol / Substance Use: Not Applicable Psych Involvement: No (comment)  Admission diagnosis:  Elevated brain natriuretic peptide (BNP) level [R79.89] Generalized weakness [R53.1] Acute on chronic systolic CHF (congestive heart failure) (HCC) [I50.23] Urinary tract infection in elderly patient [N39.0] Patient Active Problem List   Diagnosis Date Noted  . HTN (hypertension) 04/23/2019  . HLD (hyperlipidemia) 04/23/2019  . UTI (urinary tract infection) 04/23/2019  .  Closed fracture of right olecranon process   . Fall   . Generalized weakness   . Closed fracture  dislocation of right elbow 01/15/2019  . Closed rib fracture 01/15/2019  . HTN (hypertension), benign 01/15/2019  . Acute on chronic systolic CHF (congestive heart failure) ([place]) 01/15/2019  . CAD (coronary artery disease) 01/15/2019  . Atrial fibrillation, chronic ([place]) 01/15/2019  . Rhabdomyolysis 01/15/2019  . GI bleed 08/18/2015   PCP:  Leonel Ramsay, MD Pharmacy:   [id number] - [place], [place] [address] [place] [address] Phone: [phone] Fax: [phone]     Social Determinants of Health (SDOH) Interventions    Readmission Risk Interventions No flowsheet data found.

## 2019-04-24 NOTE — Plan of Care (Addendum)
VSS. Afib on tele. Denies any pain. MRI complete overnight. Falls precautions maintained. Pt up w/ standby assist to bedside commode. Fluid restriction maintained. O2 sats >95% on RA. Intermittent need for 1L O2 previously. General weakness present. Assessment as documented. POC reviewed. No new needs/concerns.   Problem: Education: Goal: Knowledge of General Education information will improve Description: Including pain rating scale, medication(s)/side effects and non-pharmacologic comfort measures Outcome: Progressing   Problem: Health Behavior/Discharge Planning: Goal: Ability to manage health-related needs will improve Outcome: Progressing   Problem: Clinical Measurements: Goal: Ability to maintain clinical measurements within normal limits will improve Outcome: Progressing Goal: Will remain free from infection Outcome: Progressing Goal: Diagnostic test results will improve Outcome: Progressing Goal: Respiratory complications will improve Outcome: Progressing Goal: Cardiovascular complication will be avoided Outcome: Progressing   Problem: Activity: Goal: Risk for activity intolerance will decrease Outcome: Progressing   Problem: Nutrition: Goal: Adequate nutrition will be maintained Outcome: Progressing   Problem: Coping: Goal: Level of anxiety will decrease Outcome: Progressing   Problem: Elimination: Goal: Will not experience complications related to bowel motility Outcome: Progressing Goal: Will not experience complications related to urinary retention Outcome: Progressing   Problem: Pain Managment: Goal: General experience of comfort will improve Outcome: Progressing   Problem: Safety: Goal: Ability to remain free from injury will improve Outcome: Progressing   Problem: Skin Integrity: Goal: Risk for impaired skin integrity will decrease Outcome: Progressing   Problem: Education: Goal: Ability to demonstrate management of disease process will  improve Outcome: Progressing Goal: Ability to verbalize understanding of medication therapies will improve Outcome: Progressing Goal: Individualized Educational Video(s) Outcome: Progressing   Problem: Activity: Goal: Capacity to carry out activities will improve Outcome: Progressing   Problem: Cardiac: Goal: Ability to achieve and maintain adequate cardiopulmonary perfusion will improve Outcome: Progressing

## 2019-04-24 NOTE — Progress Notes (Signed)
Patient ID: Sheryl Suarez, female   DOB: February 20, 1924, 86 y.o.   MRN: [id number] [facility] PROGRESS NOTE  Sheryl Suarez [id number] DOB: 1924-01-16 DOA: 04/23/2019 PCP: Leonel Ramsay, MD  HPI/Subjective: Patient states that she is breathing a little bit better today than she was yesterday.  Some cough but nonproductive.  States she has had 7 falls in the last few months.  Daughter has noticed her right leg is dragging a little bit when she walks.  Objective: Vitals:   04/24/19 1430 04/24/19 1544  BP:  (!) 104/57  Pulse:  72  Resp:  16  Temp:  98.4 F (36.9 C)  SpO2: 94% 97%    Intake/Output Summary (Last 24 hours) at 04/24/2019 1716 Last data filed at 04/24/2019 1401 Gross per 24 hour  Intake 407.35 ml  Output 150 ml  Net 257.35 ml   Filed Weights   04/23/19 1038 04/24/19 0554  Weight: 47.2 kg 53.5 kg    ROS: Review of Systems  Constitutional: Negative for chills and fever.  Eyes: Negative for blurred vision.  Respiratory: Positive for cough and shortness of breath.   Cardiovascular: Negative for chest pain.  Gastrointestinal: Negative for abdominal pain, constipation, diarrhea, nausea and vomiting.  Genitourinary: Negative for dysuria.  Musculoskeletal: Negative for joint pain.  Neurological: Negative for dizziness and headaches.   Exam: Physical Exam  Constitutional: She is oriented to person, place, and time.  HENT:  Nose: No mucosal edema.  Mouth/Throat: No oropharyngeal exudate or posterior oropharyngeal edema.  Eyes: Conjunctivae and lids are normal.  Neck: Carotid bruit is not present.  Cardiovascular: S1 normal and S2 normal. Exam reveals no gallop.  Murmur heard.  Systolic murmur is present with a grade of 2/6. Respiratory: No respiratory distress. She has decreased breath sounds in the right lower field and the left lower field. She has no wheezes. She has no rhonchi. She has rales in the right lower field and the left lower field.  GI: Soft.  Bowel sounds are normal. There is no abdominal tenderness.  Musculoskeletal:     Right ankle: Swelling present.     Left ankle: Swelling present.  Lymphadenopathy:    She has no cervical adenopathy.  Neurological: She is alert and oriented to person, place, and time. No cranial nerve deficit.  Skin: Skin is warm. No rash noted. Nails show no clubbing.  Psychiatric: She has a normal mood and affect.      Data Reviewed: Basic Metabolic Panel: Recent Labs  Lab 04/23/19 1035 04/24/19 0112  NA 140 140  K 4.2 4.6  CL 105 106  CO2 30 27  GLUCOSE 102* 93  BUN 31* 30*  CREATININE 0.88 0.86  CALCIUM 10.0 9.5   CBC: Recent Labs  Lab 04/23/19 1035  WBC 8.3  HGB 11.1*  HCT 35.6*  MCV 96.5  PLT 258   BNP (last 3 results) Recent Labs    04/23/19 1222  BNP 1,715.0*     Recent Results (from the past 240 hour(s))  Respiratory Panel by RT PCR (Flu A&B, Covid) - Nasopharyngeal Swab     Status: None   Collection Time: 04/23/19  2:38 PM   Specimen: Nasopharyngeal Swab  Result Value Ref Range Status   SARS Coronavirus 2 by RT PCR NEGATIVE NEGATIVE Final    Comment: (NOTE) SARS-CoV-2 target nucleic acids are NOT DETECTED. The SARS-CoV-2 RNA is generally detectable in upper respiratoy specimens during the acute phase of infection. The lowest concentration of SARS-CoV-2 viral  copies this assay can detect is 131 copies/mL. A negative result does not preclude SARS-Cov-2 infection and should not be used as the sole basis for treatment or other patient management decisions. A negative result may occur with  improper specimen collection/handling, submission of specimen other than nasopharyngeal swab, presence of viral mutation(s) within the areas targeted by this assay, and inadequate number of viral copies (<131 copies/mL). A negative result must be combined with clinical observations, patient history, and epidemiological information. The expected result is Negative. Fact Sheet  for Patients:  PinkCheek.be Fact Sheet for Healthcare Providers:  GravelBags.it This test is not yet ap proved or cleared by the [place] FDA and  has been authorized for detection and/or diagnosis of SARS-CoV-2 by FDA under an Emergency Use Authorization (EUA). This EUA will remain  in effect (meaning this test can be used) for the duration of the COVID-19 declaration under Section 564(b)(1) of the Act, 21 U.S.C. section 360bbb-3(b)(1), unless the authorization is terminated or revoked sooner.    Influenza A by PCR NEGATIVE NEGATIVE Final   Influenza B by PCR NEGATIVE NEGATIVE Final    Comment: (NOTE) The Xpert Xpress SARS-CoV-2/FLU/RSV assay is intended as an aid in  the diagnosis of influenza from Nasopharyngeal swab specimens and  should not be used as a sole basis for treatment. Nasal washings and  aspirates are unacceptable for Xpert Xpress SARS-CoV-2/FLU/RSV  testing. Fact Sheet for Patients: PinkCheek.be Fact Sheet for Healthcare Providers: GravelBags.it This test is not yet approved or cleared by the [place] FDA and  has been authorized for detection and/or diagnosis of SARS-CoV-2 by  FDA under an Emergency Use Authorization (EUA). This EUA will remain  in effect (meaning this test can be used) for the duration of the  Covid-19 declaration under Section 564(b)(1) of the Act, 21  U.S.C. section 360bbb-3(b)(1), unless the authorization is  terminated or revoked. Performed at [facility], [address] [place], [place] [address]   CULTURE, BLOOD (ROUTINE X 2) w Reflex to ID Panel     Status: None (Preliminary result)   Collection Time: 04/23/19  6:13 PM   Specimen: BLOOD  Result Value Ref Range Status   Specimen Description BLOOD RIGHT ANTECUBITAL  Final   Special Requests   Final    BOTTLES DRAWN AEROBIC AND ANAEROBIC Blood Culture  adequate volume   Culture   Final    NO GROWTH < 12 HOURS Performed at [facility], [address], [place], [place] [address]    Report Status PENDING  Incomplete  CULTURE, BLOOD (ROUTINE X 2) w Reflex to ID Panel     Status: None (Preliminary result)   Collection Time: 04/23/19  6:13 PM   Specimen: BLOOD  Result Value Ref Range Status   Specimen Description BLOOD BLOOD RIGHT HAND  Final   Special Requests   Final    BOTTLES DRAWN AEROBIC AND ANAEROBIC Blood Culture adequate volume   Culture   Final    NO GROWTH < 12 HOURS Performed at [facility], [address] [place], [place] [address]    Report Status PENDING  Incomplete     Studies: CT Head Wo Contrast  Result Date: 04/23/2019 CLINICAL DATA:  Weakness EXAM: CT HEAD WITHOUT CONTRAST TECHNIQUE: Contiguous axial images were obtained from the base of the skull through the vertex without intravenous contrast. COMPARISON:  01/15/2019 FINDINGS: Brain: There is atrophy and chronic small vessel disease changes. No acute intracranial abnormality. Specifically, no hemorrhage, hydrocephalus,  mass lesion, acute infarction, or significant intracranial injury. Vascular: No hyperdense vessel or unexpected calcification. Skull: No acute calvarial abnormality. Sinuses/Orbits: Visualized paranasal sinuses and mastoids clear. Orbital soft tissues unremarkable. Other: None IMPRESSION: Atrophy, chronic microvascular disease. No acute intracranial abnormality. Electronically Signed   By: Rolm Baptise M.D.   On: 04/23/2019 11:46   DG Chest Port 1 View  Result Date: 04/23/2019 CLINICAL DATA:  Increasing weakness, fatigue, multiple falls over the past week, hypertension, breast cancer history EXAM: PORTABLE CHEST 1 VIEW COMPARISON:  Portable exam 1134 hours compared to 01/15/2019 FINDINGS: Enlargement of cardiac silhouette with pulmonary vascular congestion. Atherosclerotic calcification aorta. Hazy interstitial infiltrates bilaterally  question pulmonary edema versus multifocal infection. No pleural effusion or pneumothorax. Bones demineralized. IMPRESSION: BILATERAL pulmonary infiltrates question pulmonary edema versus multifocal infection. Electronically Signed   By: Lavonia Dana M.D.   On: 04/23/2019 11:42    Scheduled Meds: . aspirin EC  81 mg Oral Daily  . azithromycin  250 mg Oral Daily  . calcium-vitamin D  1 tablet Oral Daily  . dextromethorphan-guaiFENesin  1 tablet Oral BID  . diltiazem  120 mg Oral Daily  . enoxaparin (LOVENOX) injection  40 mg Subcutaneous Q24H  . furosemide  20 mg Intravenous BID  . ipratropium-albuterol  3 mL Inhalation Q6H  . isosorbide mononitrate  30 mg Oral Daily  . lisinopril  2.5 mg Oral Daily  . metoprolol tartrate  100 mg Oral BID  . pravastatin  40 mg Oral q1800  . sodium chloride flush  3 mL Intravenous Q12H   Continuous Infusions: . sodium chloride    . cefTRIAXone (ROCEPHIN)  IV 1 g (04/24/19 1419)    Assessment/Plan:  1. Acute hypoxic respiratory failure.  Pulse ox was 89% on room air.  Taper off oxygen.  Nebulizer treatments ordered 2. Acute congestive heart failure.  Echocardiogram to determine ejection fraction.  On low-dose Lasix, lisinopril and metoprolol. 3. Chest x-ray could not rule out multifocal pneumonia.  1 procalcitonin negative.  Patient given Rocephin and Zithromax in the emergency room.  I will continue that at this time. 4. Frequent falls.  Daughter noticed right leg dragging when she is walking.  We will get an MRI of the brain to rule out stroke.  Patient already on aspirin. 5. Hyperlipidemia unspecified on pravastatin 6. Hypertension essential on metoprolol and lisinopril 7. Relative bradycardia.  Discontinue Cardizem CD and decrease dose of metoprolol.  Code Status:     Code Status Orders  (From admission, onward)         Start     Ordered   04/23/19 1655  Do not attempt resuscitation (DNR)  Continuous    Question Answer Comment  In the  event of cardiac or respiratory ARREST Do not call a "code blue"   In the event of cardiac or respiratory ARREST Do not perform Intubation, CPR, defibrillation or ACLS   In the event of cardiac or respiratory ARREST Use medication by any route, position, wound care, and other measures to relive pain and suffering. May use oxygen, suction and manual treatment of airway obstruction as needed for comfort.   Comments confirmed with pt and daughter      04/23/19 1656        Code Status History    Date Active Date Inactive Code Status Order ID Comments User Context   01/15/2019 1445 01/21/2019 1733 DNR [id number]  Anda Latina, MD ED   08/18/2015 0124 08/21/2015 1555 Full Code [id number]  Rosilyn Mings  S, MD ED   Advance Care Planning Activity    Advance Directive Documentation     Most Recent Value  Type of Advance Directive  Healthcare Power of Attorney, Living will, Out of facility DNR (pink MOST or yellow form)  Pre-existing out of facility DNR order (yellow form or pink MOST form)  --  "MOST" Form in Place?  --     Family Communication: Spoke with daughter on the phone Disposition Plan: Transitional care team looking into rehab placement for this patient.  Since the patient having left leg dragging as per the daughter I will get an MRI of the brain to rule out stroke.  Trying to get respiratory status stable prior to disposition.  Echocardiogram currently not read yet.  Adjusting medications for blood pressure and heart rate.  Antibiotics:  Rocephin  Zithromax  Time spent: 28 minutes  [place]

## 2019-04-25 DIAGNOSIS — I482 Chronic atrial fibrillation, unspecified: Secondary | ICD-10-CM

## 2019-04-25 DIAGNOSIS — I5031 Acute diastolic (congestive) heart failure: Secondary | ICD-10-CM

## 2019-04-25 LAB — BASIC METABOLIC PANEL
Anion gap: 7 (ref 5–15)
BUN: 28 mg/dL — ABNORMAL HIGH (ref 8–23)
CO2: 26 mmol/L (ref 22–32)
Calcium: 9.4 mg/dL (ref 8.9–10.3)
Chloride: 106 mmol/L (ref 98–111)
Creatinine, Ser: 0.8 mg/dL (ref 0.44–1.00)
GFR calc Af Amer: >60 mL/min (ref >60)
GFR calc non Af Amer: >60 mL/min (ref >60)
Glucose, Bld: 89 mg/dL (ref 70–99)
Potassium: 3.9 mmol/L (ref 3.5–5.1)
Sodium: 139 mmol/L (ref 135–145)

## 2019-04-25 MED ORDER — METOPROLOL TARTRATE 50 MG PO TABS: 50.0000 mg | ORAL_TABLET | Freq: Two times a day (BID) | ORAL | 0 refills | Status: DC

## 2019-04-25 MED ORDER — FUROSEMIDE 20 MG PO TABS: 20.0000 mg | ORAL_TABLET | Freq: Two times a day (BID) | ORAL | 0 refills | Status: DC

## 2019-04-25 MED ORDER — CEFDINIR 300 MG PO CAPS: 300.0000 mg | ORAL_CAPSULE | Freq: Two times a day (BID) | ORAL | 0 refills | Status: AC

## 2019-04-25 MED ORDER — AZITHROMYCIN 250 MG PO TABS: ORAL_TABLET | ORAL | 0 refills | Status: AC

## 2019-04-25 MED ORDER — LISINOPRIL 2.5 MG PO TABS: 2.5000 mg | ORAL_TABLET | Freq: Every day | ORAL | 0 refills | Status: DC

## 2019-04-25 NOTE — TOC Transition Note (Signed)
Transition of Care [facility]) - CM/SW Discharge Note   Patient Details  Name: Sheryl Suarez MRN: [id number] Date of Birth: 07-15-1923  [facility] CM/SW Contact:  Eileen Stanford, LCSW Phone Number: 04/25/2019, 11:18 AM   Clinical Narrative:Clinical Social Worker facilitated patient discharge including contacting patient family and facility to confirm patient discharge plans.  Clinical information faxed to facility and family agreeable with plan.  CSW arranged ambulance transport via [facility] to [facility]  RN to call for report prior to discharge.   Final next level of care: Skilled Nursing Facility Barriers to Discharge: No Barriers Identified   Patient Goals and CMS Choice Patient states their goals for this hospitalization and ongoing recovery are:: to get stronger   Choice offered to / list presented to : Adult Children  Discharge Placement              Patient [facility] Patient to be transferred to facility by: [facility] Name of family member notified: [name] Patient and family notified of of transfer: 04/25/19  Discharge Plan and Services In-house Referral: Clinical Social Work   Post Acute Care Choice: [place]                               Social Determinants of Health (SDOH) Interventions     Readmission Risk Interventions No flowsheet data found.

## 2019-04-25 NOTE — Discharge Instructions (Signed)

## 2019-04-25 NOTE — Discharge Summary (Signed)
[facility] [place] NAME: [name]    MR#:  [id number]  DATE OF BIRTH:  08-Feb-1924  DATE OF ADMISSION:  04/23/2019 ADMITTING PHYSICIAN: Ivor Costa, MD  DATE OF DISCHARGE: 04/25/2019  PRIMARY CARE PHYSICIAN: Leonel Ramsay, MD    ADMISSION DIAGNOSIS:  Elevated brain natriuretic peptide (BNP) level [R79.89] Generalized weakness [R53.1] Acute on chronic systolic CHF (congestive heart failure) (HCC) [I50.23] Urinary tract infection in elderly patient [N39.0]  DISCHARGE DIAGNOSIS:  Principal Problem:   Acute on chronic systolic CHF (congestive heart failure) (HCC) Active Problems:   CAD (coronary artery disease)   Atrial fibrillation, chronic ([name])   Fall   HTN (hypertension)   HLD (hyperlipidemia)   UTI (urinary tract infection)   Acute respiratory failure with hypoxia (HCC)   Acute congestive heart failure (HCC)   Frequent falls   Bradycardia   SECONDARY DIAGNOSIS:   Past Medical History:  Diagnosis Date  . Breast cancer ([name])    remission  . Hypertension     HOSPITAL COURSE:   1.  Acute hypoxic respiratory failure.  Pulse ox was 89% on room air on presentation.  The patient was tapered off oxygen and lying flat.  Patient breathing much better. 2.  Acute diastolic congestive heart failure.  Echocardiogram showed a normal EF.  On low-dose Lasix, lisinopril and metoprolol.  Lungs are clear and lying flat upon discharge. 3.  Chest x-ray could not rule out multifocal pneumonia.  Patient was given Rocephin and Zithromax in the emergency room I will continue a course of treatment with Omnicef (for 5 more doses) and Zithromax (2 more doses). 4.  Frequent falls.  Daughter noticed right leg dragging when she was walking.  MRI of the brain negative for stroke.  Patient already on aspirin. 5.  Chronic atrial fibrillation on metoprolol and aspirin.  Not on anticoagulation secondary to fall risk.  Patient on aspirin only for  anticoagulation. 6.  Hyperlipidemia unspecified on pravastatin 7.  Hypertension essential on metoprolol and lisinopril 8.  Relative bradycardia has resolved after stopping Cardizem CD and decreasing dose of metoprolol 9.  Follow-up urine culture results.  Antibiotic should cover.  DISCHARGE CONDITIONS:   Satisfactory  CONSULTS OBTAINED:  None  DRUG ALLERGIES:  No Known Allergies  DISCHARGE MEDICATIONS:   Allergies as of 04/25/2019   No Known Allergies     Medication List    STOP taking these medications   bumetanide 2 MG tablet Commonly known as: BUMEX   diltiazem 120 MG 24 hr capsule Commonly known as: CARDIZEM CD     TAKE these medications   acetaminophen 325 MG tablet Commonly known as: TYLENOL Take 2 tablets (650 mg total) by mouth every 6 (six) hours as needed for mild pain or headache.   aspirin EC 81 MG tablet Take 81 mg by mouth daily.   azithromycin 250 MG tablet Commonly known as: ZITHROMAX On tab po daily for two days Start taking on: April 26, 2019   Calcium 600+D 600-400 MG-UNIT tablet Generic drug: Calcium Carbonate-Vitamin D Take 1 tablet by mouth daily.   cefdinir 300 MG capsule Commonly known as: OMNICEF Take 1 capsule (300 mg total) by mouth 2 (two) times daily for 5 doses.   Combivent Respimat 20-100 MCG/ACT Aers respimat Generic drug: Ipratropium-Albuterol Inhale 2 puffs into the lungs 4 (four) times daily as needed.   furosemide 20 MG tablet Commonly known as: Lasix Take 1 tablet (20 mg total) by mouth 2 (  two) times daily.   isosorbide mononitrate 30 MG 24 hr tablet Commonly known as: IMDUR Take 30 mg by mouth daily.   lisinopril 2.5 MG tablet Commonly known as: ZESTRIL Take 1 tablet (2.5 mg total) by mouth daily. Start taking on: April 26, 2019   lovastatin 40 MG tablet Commonly known as: MEVACOR Take 40 mg by mouth at bedtime.   metoprolol tartrate 50 MG tablet Commonly known as: LOPRESSOR Take 1 tablet (50 mg total) by  mouth 2 (two) times daily. What changed:   medication strength  how much to take        DISCHARGE INSTRUCTIONS:   Follow-up with team at rehab 1 day  If you experience worsening of your admission symptoms, develop shortness of breath, life threatening emergency, suicidal or homicidal thoughts you must seek medical attention immediately by calling 911 or calling your MD immediately  if symptoms less severe.  You Must read complete instructions/literature along with all the possible adverse reactions/side effects for all the Medicines you take and that have been prescribed to you. Take any new Medicines after you have completely understood and accept all the possible adverse reactions/side effects.   Please note  You were cared for by a hospitalist during your hospital stay. If you have any questions about your discharge medications or the care you received while you were in the hospital after you are discharged, you can call the unit and asked to speak with the hospitalist on call if the hospitalist that took care of you is not available. Once you are discharged, your primary care physician will handle any further medical issues. Please note that NO REFILLS for any discharge medications will be authorized once you are discharged, as it is imperative that you return to your primary care physician (or establish a relationship with a primary care physician if you do not have one) for your aftercare needs so that they can reassess your need for medications and monitor your lab values.    Today   CHIEF COMPLAINT:   Chief Complaint  Patient presents with  . Weakness  . Fatigue  . Fall    HISTORY OF PRESENT ILLNESS:  Sheryl Suarez  is a 86 y.o. female came in with fatigue weakness and fall   VITAL SIGNS:  Blood pressure (!) 139/59, pulse 84, temperature 98 F (36.7 C), resp. rate 16, height 5\' 2"  (1.575 m), weight 53.3 kg, SpO2 100 %.  I/O:    Intake/Output Summary (Last 24  hours) at 04/25/2019 1019 Last data filed at 04/25/2019 0900 Gross per 24 hour  Intake 1020 ml  Output 200 ml  Net 820 ml    PHYSICAL EXAMINATION:  GENERAL:  86 y.o.-year-old patient lying flat in the bed with no acute distress.  EYES: Pupils equal, round, reactive to light and accommodation. HEENT: Head atraumatic, normocephalic. Oropharynx and nasopharynx clear.  NECK:  Supple.  LUNGS: Normal breath sounds bilaterally, no wheezing, rales,rhonchi or crepitation. No use of accessory muscles of respiration.  CARDIOVASCULAR: S1, S2 normal. No murmurs, rubs, or gallops.  ABDOMEN: Soft, non-tender, non-distended.  EXTREMITIES: Trace pedal edema. NEUROLOGIC: Cranial nerves II through XII are intact.  Patient able to straight leg raise bilaterally without a problem. PSYCHIATRIC: The patient is alert and oriented x 3.  SKIN: No obvious rash, lesion, or ulcer.   DATA REVIEW:   CBC Recent Labs  Lab 04/23/19 1035  WBC 8.3  HGB 11.1*  HCT 35.6*  PLT 258    Chemistries  Recent Labs  Lab 04/25/19 0541  NA 139  K 3.9  CL 106  CO2 26  GLUCOSE 89  BUN 28*  CREATININE 0.80  CALCIUM 9.4     Microbiology Results  Results for orders placed or performed during the hospital encounter of 04/23/19  Urine Culture     Status: Abnormal (Preliminary result)   Collection Time: 04/23/19 11:57 AM   Specimen: Urine, Random  Result Value Ref Range Status   Specimen Description   Final    URINE, RANDOM Performed at [facility], [address] [place], [place] [address]    Special Requests   Final    NONE Performed at [facility], [address], [place], [place] [address]    Culture (A)  Final    90,000 COLONIES/mL GRAM POSITIVE COCCI IDENTIFICATION AND SUSCEPTIBILITIES TO FOLLOW Performed at [facility], [place] [address] [place], [place] [address]    Report Status PENDING  Incomplete  Respiratory Panel by RT PCR (Flu A&B, Covid) - Nasopharyngeal Swab      Status: None   Collection Time: 04/23/19  2:38 PM   Specimen: Nasopharyngeal Swab  Result Value Ref Range Status   SARS Coronavirus 2 by RT PCR NEGATIVE NEGATIVE Final    Comment: (NOTE) SARS-CoV-2 target nucleic acids are NOT DETECTED. The SARS-CoV-2 RNA is generally detectable in upper respiratoy specimens during the acute phase of infection. The lowest concentration of SARS-CoV-2 viral copies this assay can detect is 131 copies/mL. A negative result does not preclude SARS-Cov-2 infection and should not be used as the sole basis for treatment or other patient management decisions. A negative result may occur with  improper specimen collection/handling, submission of specimen other than nasopharyngeal swab, presence of viral mutation(s) within the areas targeted by this assay, and inadequate number of viral copies (<131 copies/mL). A negative result must be combined with clinical observations, patient history, and epidemiological information. The expected result is Negative. Fact Sheet for Patients:  PinkCheek.be Fact Sheet for Healthcare Providers:  GravelBags.it This test is not yet ap proved or cleared by the [place] FDA and  has been authorized for detection and/or diagnosis of SARS-CoV-2 by FDA under an Emergency Use Authorization (EUA). This EUA will remain  in effect (meaning this test can be used) for the duration of the COVID-19 declaration under Section 564(b)(1) of the Act, 21 U.S.C. section 360bbb-3(b)(1), unless the authorization is terminated or revoked sooner.    Influenza A by PCR NEGATIVE NEGATIVE Final   Influenza B by PCR NEGATIVE NEGATIVE Final    Comment: (NOTE) The Xpert Xpress SARS-CoV-2/FLU/RSV assay is intended as an aid in  the diagnosis of influenza from Nasopharyngeal swab specimens and  should not be used as a sole basis for treatment. Nasal washings and  aspirates are unacceptable for  Xpert Xpress SARS-CoV-2/FLU/RSV  testing. Fact Sheet for Patients: PinkCheek.be Fact Sheet for Healthcare Providers: GravelBags.it This test is not yet approved or cleared by the [place] FDA and  has been authorized for detection and/or diagnosis of SARS-CoV-2 by  FDA under an Emergency Use Authorization (EUA). This EUA will remain  in effect (meaning this test can be used) for the duration of the  Covid-19 declaration under Section 564(b)(1) of the Act, 21  U.S.C. section 360bbb-3(b)(1), unless the authorization is  terminated or revoked. Performed at [facility], [place], [place] [address]   CULTURE, BLOOD (ROUTINE X 2) w Reflex to ID Panel  Status: None (Preliminary result)   Collection Time: 04/23/19  6:13 PM   Specimen: BLOOD  Result Value Ref Range Status   Specimen Description BLOOD RIGHT ANTECUBITAL  Final   Special Requests   Final    BOTTLES DRAWN AEROBIC AND ANAEROBIC Blood Culture adequate volume   Culture   Final    NO GROWTH 2 DAYS Performed at [facility], [address], [place], [place] [address]    Report Status PENDING  Incomplete  CULTURE, BLOOD (ROUTINE X 2) w Reflex to ID Panel     Status: None (Preliminary result)   Collection Time: 04/23/19  6:13 PM   Specimen: BLOOD  Result Value Ref Range Status   Specimen Description BLOOD BLOOD RIGHT HAND  Final   Special Requests   Final    BOTTLES DRAWN AEROBIC AND ANAEROBIC Blood Culture adequate volume   Culture   Final    NO GROWTH 2 DAYS Performed at [facility], [address], [place], [place] [address]    Report Status PENDING  Incomplete    RADIOLOGY:  CT Head Wo Contrast  Result Date: 04/23/2019 CLINICAL DATA:  Weakness EXAM: CT HEAD WITHOUT CONTRAST TECHNIQUE: Contiguous axial images were obtained from the base of the skull through the vertex without intravenous contrast. COMPARISON:   01/15/2019 FINDINGS: Brain: There is atrophy and chronic small vessel disease changes. No acute intracranial abnormality. Specifically, no hemorrhage, hydrocephalus, mass lesion, acute infarction, or significant intracranial injury. Vascular: No hyperdense vessel or unexpected calcification. Skull: No acute calvarial abnormality. Sinuses/Orbits: Visualized paranasal sinuses and mastoids clear. Orbital soft tissues unremarkable. Other: None IMPRESSION: Atrophy, chronic microvascular disease. No acute intracranial abnormality. Electronically Signed   By: Rolm Baptise M.D.   On: 04/23/2019 11:46   MR BRAIN WO CONTRAST  Result Date: 04/24/2019 CLINICAL DATA:  Atrial fibrillation. Generalized weakness and falling. Ataxia. EXAM: MRI HEAD WITHOUT CONTRAST TECHNIQUE: Multiplanar, multiecho pulse sequences of the brain and surrounding structures were obtained without intravenous contrast. COMPARISON:  Head CT yesterday. FINDINGS: Brain: Diffusion imaging does not show any acute or subacute infarction. The brainstem is normal. No focal cerebellar insult. Cerebral hemispheres show age related volume loss with mild chronic small-vessel ischemic change of the white matter considering age. No cortical or large vessel territory infarction. No mass lesion, hemorrhage, hydrocephalus or extra-axial collection. Vascular: Major vessels at the base of the brain show flow. Skull and upper cervical spine: Negative Sinuses/Orbits: Clear/normal Other: None IMPRESSION: No acute finding. Age related volume loss. Mild for age chronic small-vessel ischemic changes of the white matter. Electronically Signed   By: Nelson Chimes M.D.   On: 04/24/2019 22:30   DG Chest Port 1 View  Result Date: 04/23/2019 CLINICAL DATA:  Increasing weakness, fatigue, multiple falls over the past week, hypertension, breast cancer history EXAM: PORTABLE CHEST 1 VIEW COMPARISON:  Portable exam 1134 hours compared to 01/15/2019 FINDINGS: Enlargement of cardiac  silhouette with pulmonary vascular congestion. Atherosclerotic calcification aorta. Hazy interstitial infiltrates bilaterally question pulmonary edema versus multifocal infection. No pleural effusion or pneumothorax. Bones demineralized. IMPRESSION: BILATERAL pulmonary infiltrates question pulmonary edema versus multifocal infection. Electronically Signed   By: Lavonia Dana M.D.   On: 04/23/2019 11:42   ECHOCARDIOGRAM COMPLETE  Result Date: 04/24/2019    ECHOCARDIOGRAM REPORT   Patient Name:   GERELDINE NUCCIO Date of Exam: 04/24/2019 Medical Rec #:  [id number]     Height:       62.0 in Accession #:    [id number]  Weight:       117.9 lb Date of Birth:  01-Jun-1923     BSA:          1.527 m Patient Age:    24 years      BP:           147/80 mmHg Patient Gender: F             HR:           71 bpm. Exam Location:  [facility] Procedure: 2D Echo, Color Doppler and Cardiac Doppler Indications:     I50.31 CHF-Acute Diastolic  History:         Patient has no prior history of Echocardiogram examinations.                  Previous Myocardial Infarction; Risk Factors:Hypertension. Hx                  of breast cancer.  Sonographer:     Charmayne Sheer RDCS (AE) Referring Phys:  [id number] Ivor Costa Diagnosing Phys: Yolonda Kida MD IMPRESSIONS  1. Left ventricular ejection fraction, by estimation, is 70 to 75%. Left ventricular ejection fraction by PLAX is 73 %. The left ventricle has hyperdynamic function. The left ventricle has no regional wall motion abnormalities. Left ventricular diastolic parameters were normal. There is the interventricular septum is flattened in systole and diastole, consistent with right ventricular pressure and volume overload.  2. Right ventricular systolic function is moderately reduced. The right ventricular size is moderately enlarged. There is severely elevated pulmonary artery systolic pressure.  3. Left atrial size was mildly dilated.  4. Right atrial size was moderately dilated.  5. The mitral valve is  grossly normal. Moderate mitral valve regurgitation.  6. Tricuspid valve regurgitation is moderate.  7. The aortic valve is grossly normal. Aortic valve regurgitation is trivial. Mild aortic valve sclerosis is present, with no evidence of aortic valve stenosis. FINDINGS  Left Ventricle: Left ventricular ejection fraction, by estimation, is 70 to 75%. Left ventricular ejection fraction by PLAX is 73 % The left ventricle has hyperdynamic function. The left ventricle has no regional wall motion abnormalities. The left ventricular internal cavity size was normal in size. There is no left ventricular hypertrophy. The interventricular septum is flattened in systole and diastole, consistent with right ventricular pressure and volume overload. Left ventricular diastolic parameters were normal. Right Ventricle: The right ventricular size is moderately enlarged. No increase in right ventricular wall thickness. Right ventricular systolic function is moderately reduced. There is severely elevated pulmonary artery systolic pressure. The tricuspid regurgitant velocity is 3.92 m/s, and with an assumed right atrial pressure of 10 mmHg, the estimated right ventricular systolic pressure is 123456 mmHg. Left Atrium: Left atrial size was mildly dilated. Right Atrium: Right atrial size was moderately dilated. Pericardium: There is no evidence of pericardial effusion. Mitral Valve: The mitral valve is grossly normal. There is mild thickening of the mitral valve leaflet(s). Moderate mitral valve regurgitation. MV peak gradient, 5.1 mmHg. The mean mitral valve gradient is 2.0 mmHg. Tricuspid Valve: The tricuspid valve is normal in structure. Tricuspid valve regurgitation is moderate. Aortic Valve: The aortic valve is grossly normal. Aortic valve regurgitation is trivial. Mild aortic valve sclerosis is present, with no evidence of aortic valve stenosis. Aortic valve mean gradient measures 3.0 mmHg. Aortic valve peak gradient measures 5.1  mmHg. Aortic valve area, by VTI measures 1.36 cm. Pulmonic Valve: The pulmonic valve was normal in structure. Pulmonic valve  regurgitation is not visualized. Aorta: The aortic root is normal in size and structure. IAS/Shunts: The atrial septum is grossly normal.  LEFT VENTRICLE PLAX 2D LV EF:         Left            Diastology                ventricular     LV e' medial:   5.22 cm/s                ejection        LV E/e' medial: 19.3                fraction by                PLAX is 73 % LVIDd:         3.97 cm LVIDs:         2.32 cm LV PW:         0.92 cm LV IVS:        0.83 cm LVOT diam:     1.80 cm LV SV:         33 LV SV Index:   21 LVOT Area:     2.54 cm  RIGHT VENTRICLE RV Basal diam:  3.25 cm LEFT ATRIUM           Index       RIGHT ATRIUM           Index LA diam:      3.80 cm 2.49 cm/m  RA Area:     17.70 cm LA Vol (A2C): 28.9 ml 18.92 ml/m RA Volume:   47.90 ml  31.37 ml/m LA Vol (A4C): 56.9 ml 37.26 ml/m  AORTIC VALVE                   PULMONIC VALVE AV Area (Vmax):    1.55 cm    PV Vmax:       0.60 m/s AV Area (Vmean):   1.42 cm    PV Vmean:      33.600 cm/s AV Area (VTI):     1.36 cm    PV VTI:        0.099 m AV Vmax:           113.00 cm/s PV Peak grad:  1.4 mmHg AV Vmean:          78.500 cm/s PV Mean grad:  1.0 mmHg AV VTI:            0.240 m AV Peak Grad:      5.1 mmHg AV Mean Grad:      3.0 mmHg LVOT Vmax:         68.70 cm/s LVOT Vmean:        43.700 cm/s LVOT VTI:          0.128 m LVOT/AV VTI ratio: 0.53  AORTA Ao Root diam: 3.20 cm MITRAL VALVE                TRICUSPID VALVE MV Area (PHT): 3.56 cm     TR Peak grad:   61.5 mmHg MV Peak grad:  5.1 mmHg     TR Vmax:        392.00 cm/s MV Mean grad:  2.0 mmHg MV Vmax:       1.13 m/s     SHUNTS MV Vmean:      62.5 cm/s    Systemic VTI:  0.13 m MV Decel Time: 213 msec     Systemic Diam: 1.80 cm MV E velocity: 101.00 cm/s Yolonda Kida MD Electronically signed by Yolonda Kida MD Signature Date/Time: 04/24/2019/7:35:36 PM    Final       Management plans discussed with the patient, family and they are in agreement.  CODE STATUS:     Code Status Orders  (From admission, onward)         Start     Ordered   04/23/19 1655  Do not attempt resuscitation (DNR)  Continuous    Question Answer Comment  In the event of cardiac or respiratory ARREST Do not call a "code blue"   In the event of cardiac or respiratory ARREST Do not perform Intubation, CPR, defibrillation or ACLS   In the event of cardiac or respiratory ARREST Use medication by any route, position, wound care, and other measures to relive pain and suffering. May use oxygen, suction and manual treatment of airway obstruction as needed for comfort.   Comments confirmed with pt and daughter      04/23/19 1656        Code Status History    Date Active Date Inactive Code Status Order ID Comments User Context   01/15/2019 1445 01/21/2019 1733 DNR [id number]  Anda Latina, MD ED   08/18/2015 0124 08/21/2015 1555 Full Code [id number]  Harrie Foreman, MD ED   Advance Care Planning Activity    Advance Directive Documentation     Most Recent Value  Type of Advance Directive  Healthcare Power of Attorney, Living will, Out of facility DNR (pink MOST or yellow form)  Pre-existing out of facility DNR order (yellow form or pink MOST form)  --  "MOST" Form in Place?  --      TOTAL TIME TAKING CARE OF THIS PATIENT: 35 minutes.    Loletha Grayer M.D on 04/25/2019 at 10:19 AM  Between 7am to 6pm - Pager - [phone]  After 6pm go to [email or web address] - password [email or web address]  [facility]  CC: Primary care physician; Leonel Ramsay, MD

## 2019-04-25 NOTE — TOC Progression Note (Signed)
Transition of Care [facility]) - Progression Note    Patient Details  Name: Sheryl Suarez MRN: [id number] Date of Birth: December 12, 1923  Transition of Care [facility]) CM/SW [place], LCSW Phone Number: 04/25/2019, 9:04 AM  Clinical Narrative:   [name]. Pt's daughter has to provide proof that pt has obtained COVID vaccination. Pt's daughter will bring the paper today.    Expected Discharge Plan: [place] Barriers to Discharge: Continued Medical Work up  Expected Discharge Plan and Services Expected Discharge Plan: [place] In-house Referral: Clinical Social Work   Post Acute Care Choice: [place] Living arrangements for the past 2 months: Single Family Home                                       Social Determinants of Health (SDOH) Interventions    Readmission Risk Interventions No flowsheet data found.

## 2019-04-25 NOTE — Progress Notes (Signed)
ReDS Pro not appropriate due to BMI < 22.

## 2019-04-27 LAB — URINE CULTURE: Culture: 90000 — AB

## 2019-04-28 LAB — CULTURE, BLOOD (ROUTINE X 2)
Culture: NO GROWTH
Culture: NO GROWTH
Special Requests: ADEQUATE
Special Requests: ADEQUATE

## 2019-04-29 ENCOUNTER — Telehealth: Payer: Self-pay | Admitting: Family

## 2019-04-29 DIAGNOSIS — I5032 Chronic diastolic (congestive) heart failure: Secondary | ICD-10-CM

## 2019-04-29 DIAGNOSIS — I25119 Atherosclerotic heart disease of native coronary artery with unspecified angina pectoris: Secondary | ICD-10-CM

## 2019-04-29 DIAGNOSIS — I482 Chronic atrial fibrillation, unspecified: Secondary | ICD-10-CM

## 2019-04-29 DIAGNOSIS — I10 Essential (primary) hypertension: Secondary | ICD-10-CM

## 2019-04-29 NOTE — Telephone Encounter (Signed)
Spoke to Nurse at [facility] regarding patient. She is doing well since she left hospital with no symptoms or complaints. She is taking her meds everyday, taking a daily weight, and they have her on a heart healthy diet. They confirmed her New Patient CHF Clinic appt for 3/11.    Alyse Low, [place]

## 2019-05-01 NOTE — Progress Notes (Signed)
Patient ID: Sheryl Suarez, female    DOB: 03/06/1923, 86 y.o.   MRN: [id number]  HPI  Sheryl Suarez is a 86 y/o female with a history of HTN, atrial fibrillation, CAD (MI), breast cancer and chronic heart failure.   Echo report from 04/24/19 reviewed and showed an EF of 70-75% along with severely elevated PA pressure, moderate MR/TR and trivial AR.   Admitted 04/23/19 due to acute on chronic HF. Initially given IV lasix and then transitioned to oral diuretics. Given antibiotics due to possible pneumonia. Weaned off of oxygen. Brain MRI done due to right leg dragging which was negative for stroke. Discharged after 2 days.   She presents today for her initial visit with a chief complaint of minimal fatigue upon moderate exertion. She describes this as chronic in nature having been present for several months. She does feel more fatigue after she does PT at rehab. She has not other complaints and specifically denies any difficulty sleeping, dizziness, abdominal distention, palpitations, pedal edema, chest pain, shortness of breath, cough or weight gain.   Past Medical History:  Diagnosis Date  . Arrhythmia    atrial fibrillation  . Breast cancer ([place])    remission  . CHF (congestive heart failure) ([place])   . Coronary artery disease   . Hypertension    Past Surgical History:  Procedure Laterality Date  . ABDOMINAL HYSTERECTOMY    . APPENDECTOMY    . BREAST IMPLANT EXCHANGE    . CHOLECYSTECTOMY    . ESOPHAGOGASTRODUODENOSCOPY (EGD) WITH PROPOFOL N/A 08/20/2015   Procedure: ESOPHAGOGASTRODUODENOSCOPY (EGD) WITH PROPOFOL;  Surgeon: Lollie Sails, MD;  Location: [facility] ENDOSCOPY;  Service: Endoscopy;  Laterality: N/A;  . MASTECTOMY Bilateral   . ORIF ELBOW FRACTURE Right 01/16/2019   Procedure: OPEN REDUCTION INTERNAL FIXATION (ORIF) ELBOW/OLECRANON FRACTURE;  Surgeon: Earnestine Leys, MD;  Location: [facility] ORS;  Service: Orthopedics;  Laterality: Right;   Family History  Problem Relation Age of  Onset  . CAD Mother   . CAD Father    Social History   Tobacco Use  . Smoking status: Never Smoker  . Smokeless tobacco: Never Used  Substance Use Topics  . Alcohol use: No   No Known Allergies   Prior to Admission medications   Medication Sig Start Date End Date Taking? Authorizing Provider  acetaminophen (TYLENOL) 325 MG tablet Take 2 tablets (650 mg total) by mouth every 6 (six) hours as needed for mild pain or headache. 01/21/19  Yes Lorella Nimrod, MD  aspirin EC 81 MG tablet Take 81 mg by mouth daily.   Yes [provider]  Calcium Carbonate-Vitamin D (CALCIUM 600+D) 600-400 MG-UNIT tablet Take 1 tablet by mouth daily.    Yes [provider]  COMBIVENT RESPIMAT 20-100 MCG/ACT AERS respimat Inhale 2 puffs into the lungs 4 (four) times daily as needed. 01/08/19  Yes [provider]  furosemide (LASIX) 20 MG tablet Take 1 tablet (20 mg total) by mouth 2 (two) times daily. 04/25/19  Yes Wieting, Richard, MD  isosorbide mononitrate (IMDUR) 30 MG 24 hr tablet Take 30 mg by mouth daily. 12/10/18  Yes [provider]  lisinopril (ZESTRIL) 2.5 MG tablet Take 1 tablet (2.5 mg total) by mouth daily. 04/26/19  Yes Wieting, Richard, MD  lovastatin (MEVACOR) 40 MG tablet Take 40 mg by mouth at bedtime. 01/06/19  Yes [provider]  metoprolol tartrate (LOPRESSOR) 50 MG tablet Take 1 tablet (50 mg total) by mouth 2 (two) times daily. 04/25/19  Yes Loletha Grayer, MD    Review of Systems  Constitutional: Positive for fatigue (with therapy). Negative for appetite change.  HENT: Positive for hearing loss. Negative for congestion, postnasal drip and sore throat.   Eyes: Negative.   Respiratory: Negative for cough and shortness of breath.   Cardiovascular: Negative for chest pain, palpitations and leg swelling.  Gastrointestinal: Negative for abdominal distention and abdominal pain.  Endocrine: Negative.   Genitourinary: Negative.   Musculoskeletal:  Negative for back pain and neck pain.       Hands feel stiff  Allergic/Immunologic: Negative.   Neurological: Negative for dizziness and light-headedness.  Hematological: Negative for adenopathy. Does not bruise/bleed easily.  Psychiatric/Behavioral: Negative for dysphoric mood and sleep disturbance (sleeping on 1 pillow). The patient is not nervous/anxious.    Vitals:   05/02/19 1043  BP: (!) 117/47  Pulse: 66  Resp: 16  SpO2: 100%  Weight: 115 lb 8 oz (52.4 kg)  Height: 5\' 2"  (1.575 m)   Wt Readings from Last 3 Encounters:  05/02/19 115 lb 8 oz (52.4 kg)  04/25/19 117 lb 6.4 oz (53.3 kg)  01/16/19 105 lb (47.6 kg)    Lab Results  Component Value Date   CREATININE 0.80 04/25/2019   CREATININE 0.86 04/24/2019   CREATININE 0.88 04/23/2019     Physical Exam Vitals and nursing note reviewed.  Constitutional:      Appearance: She is well-developed.  HENT:     Head: Normocephalic and atraumatic.     Right Ear: Decreased hearing noted.     Left Ear: Decreased hearing noted.  Neck:     Vascular: No JVD.  Cardiovascular:     Rate and Rhythm: Normal rate and regular rhythm.  Pulmonary:     Effort: Pulmonary effort is normal. No respiratory distress.     Breath sounds: No wheezing or rales.  Abdominal:     Palpations: Abdomen is soft.     Tenderness: There is no abdominal tenderness.  Musculoskeletal:     Cervical back: Normal range of motion and neck supple.     Right lower leg: No tenderness. No edema.     Left lower leg: No tenderness. No edema.  Skin:    General: Skin is warm and dry.  Neurological:     Mental Status: She is alert and oriented to person, place, and time.  Psychiatric:        Mood and Affect: Mood normal.        Behavior: Behavior normal.     Assessment & Plan:  1: Chronic heart failure with preserved ejection fraction- - NYHA class II - euvolemic today - being weighed at the facility; reminded to call for an overnight weight gain of >2  pounds or a weekly weight gain of >5 pounds once she returns home - patient denies adding salt and daughter that is present says that she doesn't cook with salt either - saw cardiology ([place]) 04/02/19 - currently receiving PT at [facility] - BNP 04/23/19 was 1715.0  2: HTN- - BP on the low side - saw PCP Edwina Barth) 03/19/19 & is supposed to return 05/17/19 - BMP 04/25/19 reviewed and showed sodium 139, potassium 3.9, creatinine 0.8 and GFR >60  3: Pulmonary HTN- - saw pulmonology Lanney Gins) 12/12/2018 - may need follow-up appointment; they will discuss at [facility] medication list reviewed.   Due to stability of HF and difficulty getting patient to appointments, will not make a return appointment for patient at  this time. Advised patient and daughter that they could call back at anytime to schedule another appointment and they were comfortable with this plan.

## 2019-05-02 ENCOUNTER — Encounter: Payer: Self-pay | Admitting: Family

## 2019-05-02 ENCOUNTER — Ambulatory Visit: Payer: Medicare Other | Attending: Family | Admitting: Family

## 2019-05-02 ENCOUNTER — Other Ambulatory Visit: Payer: Self-pay

## 2019-05-02 VITALS — BP 117/47 | HR 66 | Resp 16 | Ht 62.0 in | Wt 115.5 lb

## 2019-05-02 DIAGNOSIS — I2510 Atherosclerotic heart disease of native coronary artery without angina pectoris: Secondary | ICD-10-CM | POA: Diagnosis not present

## 2019-05-02 DIAGNOSIS — Z8249 Family history of ischemic heart disease and other diseases of the circulatory system: Secondary | ICD-10-CM | POA: Insufficient documentation

## 2019-05-02 DIAGNOSIS — Z7982 Long term (current) use of aspirin: Secondary | ICD-10-CM | POA: Diagnosis not present

## 2019-05-02 DIAGNOSIS — Z90710 Acquired absence of both cervix and uterus: Secondary | ICD-10-CM | POA: Diagnosis not present

## 2019-05-02 DIAGNOSIS — Z853 Personal history of malignant neoplasm of breast: Secondary | ICD-10-CM | POA: Diagnosis not present

## 2019-05-02 DIAGNOSIS — I5032 Chronic diastolic (congestive) heart failure: Secondary | ICD-10-CM | POA: Diagnosis present

## 2019-05-02 DIAGNOSIS — Z79899 Other long term (current) drug therapy: Secondary | ICD-10-CM | POA: Diagnosis not present

## 2019-05-02 DIAGNOSIS — I10 Essential (primary) hypertension: Secondary | ICD-10-CM

## 2019-05-02 DIAGNOSIS — Z9013 Acquired absence of bilateral breasts and nipples: Secondary | ICD-10-CM | POA: Insufficient documentation

## 2019-05-02 DIAGNOSIS — I272 Pulmonary hypertension, unspecified: Secondary | ICD-10-CM | POA: Diagnosis not present

## 2019-05-02 DIAGNOSIS — Z9049 Acquired absence of other specified parts of digestive tract: Secondary | ICD-10-CM | POA: Insufficient documentation

## 2019-05-02 DIAGNOSIS — Z7951 Long term (current) use of inhaled steroids: Secondary | ICD-10-CM | POA: Insufficient documentation

## 2019-05-02 DIAGNOSIS — I110 Hypertensive heart disease with heart failure: Secondary | ICD-10-CM | POA: Insufficient documentation

## 2019-05-02 NOTE — Patient Instructions (Signed)
Continue weighing daily and call for an overnight weight gain of > 2 pounds or a weekly weight gain of >5 pounds. 

## 2019-05-31 ENCOUNTER — Telehealth: Payer: Self-pay | Admitting: Nurse Practitioner

## 2019-05-31 NOTE — Telephone Encounter (Signed)
Spoke with patient's daughter Ernest Pine regarding Palliative services and she was in agreement with this.  I have scheduled an In-person Consult for 06/04/19 @ 10 AM.

## 2019-06-04 ENCOUNTER — Other Ambulatory Visit: Payer: Medicare Other | Admitting: Nurse Practitioner

## 2019-06-04 ENCOUNTER — Encounter: Payer: Self-pay | Admitting: Nurse Practitioner

## 2019-06-04 ENCOUNTER — Other Ambulatory Visit: Payer: Self-pay

## 2019-06-04 DIAGNOSIS — Z515 Encounter for palliative care: Secondary | ICD-10-CM

## 2019-06-04 DIAGNOSIS — I509 Heart failure, unspecified: Secondary | ICD-10-CM

## 2019-06-04 NOTE — Progress Notes (Signed)
[facility] Consult Note Telephone: [phone]  Fax: [phone]  PATIENT NAME: Sheryl Suarez DOB: 04-Feb-1924 MRN: [id number]  PRIMARY CARE PROVIDER:   Leonel Ramsay, MD  REFERRING PROVIDER:  Leonel Ramsay, MD [place],  [place] [address]  RESPONSIBLE PARTY:   Daughter [name]  I was asked by Dr Ola Spurr to see Sheryl Suarez for Palliative care consult for goals of care.   RECOMMENDATIONS and PLAN:  1. ACP: DNR, placed in vynca  2. Palliative care encounter; Palliative medicine team will continue to support patient, patient's family, and medical team. Visit consisted of counseling and education dealing with the complex and emotionally intense issues of symptom management and palliative care in the setting of serious and potentially life-threatening illness  I spent 90 minutes providing this consultation,  from 9:30 to 11:00am More than 50% of the time in this consultation was spent coordinating communication.   HISTORY OF PRESENT ILLNESS:  Sheryl Suarez Suarez a 86 y.o. year old female with multiple medical problems includingCoronary artery disease, Congestive heart failure, atrial fibrillation, breast cancer in remission, hypertension, history of GI bleed, hyperlipidemia, mastectomy, cholecystectomy, appendectomy, abdominal hysterectomy, right orif elbow. Hospitalised 11 / 24 / 2020 to 34 / 30 / 2020 after multiple falls. She was living alone. She was found to have acute distracted fracture of the right all cranial process of the proximal ulna and some hemarthrosis. Chest x-ray mildly displaced fractures of anterior lateral aspect of right second and third ribs. She did go to surgery for orif of elbow. Hospitalized 3 / 2 / 2021 to 3/4 / 2021 for acute hypoxic respiratory failure improve with oxygen, acute diastolic congestive heart failure with normal EF. Chest x-ray could not rule out multifocal pneumonia so she  received antibiotic therapy. Frequent falls. Family noticed Sheryl Suarez dragging her right leg. MRI done negative for stroke already on aspirin. Chronic atrial fibrillation not on anticoagulation secondary to fall risk though does take aspirin. Sheryl Suarez was discharged to short-term rehab at [facility]. Sheryl Suarez completed short-term rehab and then was discharged home. Prior to hospitalization and 11 / 2020 she was living independently by herself. She was having Falls intermittently. Since last hospitalization and discharge from take her son and daughter-in-law have been staying with her. Initial palliative care visit today for goals of care. Palliative care visit was done in a face-to-face visit. At present Sheryl Suarez sitting in the chair in her living room with her daughter Sheryl Suarez and Son, daughter-in-law. We talked about purpose of palliative care visit. This Sheryl Suarez in [place]. We talked about past medical history in the study of chronic disease and progression. We talked about recent hospitalizations. We talked about her functional ability. We talked about Sheryl Suarez ambulating with a walker. Sheryl Suarez endorses that there Suarez no other way that she can walk she has to have her walker. We talked about her ability that bathes and get herself dressed. Sheryl Suarez endorses she gets herself a bird bath at the sink. Sheryl. Suarez endorses that she Suarez able to get herself dressed but it does take her quite a long time. We talked about Sheryl. Suarez ability to feed herself which she does. We talked about poor appetite which has been good. No noted weight loss. We talked about symptoms of shortness of breath, she denies. She does complain of exhaustion though, weakness and fatigue. Sheryl. Suarez endorses she has weakness in her hands  and in her legs. Sheryl Suarez endorses she Suarez afraid that she Suarez going to fall. Her legs become weak and she loses her balance. We talked about her ability to get in and out of bed . We talked about  her ability to toilet herself. She does have a bedside commode raised but the concern was if she Suarez able to get herself cleaning the house. She does become tired and fatigued easily. We talked about her hands making it more difficult for her to hold things and the strength in her hands have been decreased. We talked about physical therapy and occupational. We talked about using the sponge involved a strength in her hand. Sheryl. Suarez has talked at length about the frustration with loss of Independence. Sheryl. Suarez endorses that she Suarez having a hard time having to rely on others for care. Sheryl Suarez wishes that she could return back to when she was independent on her own, when she can be independent without having anyone stay with her. We talked about her son and daughter-in-law staying for these two weeks and then heading off with daughter Sheryl Suarez. Sheryl Suarez endorses that it Suarez very hard for her. Sheryl. 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 We talked at length about the challenges with loss of Independence. We talked about medical goals of care if she does have a living will. She Suarez a DNR. She does have a [place] in home and agreeable to have placed in [place]. We talked about the most form, options of aggressive versus conservative versus Comfort Care, IV fluids, antibiotics, feeding tubes. Blank copy of MOST form left for family to review and further discussion will revisit at next palliative care visit. We talked about role of palliative care and plan of care. We talked about will follow up in 2 weeks if needed or sooner should she declined wants therapy has been completed. Sheryl. Sheryl Suarez both in agreement, appointment schedule. Therapeutic listening and emotional support provided. Contact information provided. Questions answered the satisfaction. I was able to talk to [facility] separately. We talked about ongoing grieving process that Sheryl Suarez Suarez  experiencing. We talked about Sheryl. Suarez talking a lot about her age be in 22, ready to pass on. Sheryl. Zelmer endorses she Suarez ready to be with her husband. We talked about depression. We talked about grieving loss of Independence and loss of the way her life used to be. We talked about coping strategies and support. Cindy expressed she was thankful for visit and discussion. Palliative Care was asked to help address goals of care.   CODE STATUS: DNR  PPS: 40% HOSPICE ELIGIBILITY/DIAGNOSIS: TBD  PAST MEDICAL HISTORY:  Past Medical History:  Diagnosis Date  . Arrhythmia    atrial fibrillation  . Breast cancer ([place])    remission  . CHF (congestive heart failure) ([place])   . Coronary artery disease   . Hypertension     SOCIAL HX:  Social History   Tobacco Use  . Smoking status: Never Smoker  . Smokeless tobacco: Never Used  Substance Use Topics  . Alcohol use: No    ALLERGIES: No Known Allergies   PERTINENT MEDICATIONS:  Outpatient Encounter Medications as of 06/04/2019  Medication Sig  . acetaminophen (TYLENOL) 325 MG tablet Take 2 tablets (650 mg total) by mouth every 6 (six) hours as needed for mild pain or headache.  Marland Kitchen aspirin EC 81 MG  tablet Take 81 mg by mouth daily.  . Calcium Carbonate-Vitamin D (CALCIUM 600+D) 600-400 MG-UNIT tablet Take 1 tablet by mouth daily.   . COMBIVENT RESPIMAT 20-100 MCG/ACT AERS respimat Inhale 2 puffs into the lungs 4 (four) times daily as needed.  . furosemide (LASIX) 20 MG tablet Take 1 tablet (20 mg total) by mouth 2 (two) times daily.  . isosorbide mononitrate (IMDUR) 30 MG 24 hr tablet Take 30 mg by mouth daily.  Marland Kitchen lisinopril (ZESTRIL) 2.5 MG tablet Take 1 tablet (2.5 mg total) by mouth daily.  Marland Kitchen lovastatin (MEVACOR) 40 MG tablet Take 40 mg by mouth at bedtime.  . metoprolol tartrate (LOPRESSOR) 50 MG tablet Take 1 tablet (50 mg total) by mouth 2 (two) times daily.   No facility-administered encounter medications on file as of 06/04/2019.     PHYSICAL EXAM:   General: NAD, frail appearing, thin elderly female Cardiovascular: regular rate and rhythm Pulmonary: clear ant fields Extremities: no edema, no joint deformities Neurological: generalized weakness, walks with walker  Fredy Gladu Ihor Gully, NP

## 2019-06-19 ENCOUNTER — Telehealth: Payer: Self-pay | Admitting: Nurse Practitioner

## 2019-06-19 NOTE — Telephone Encounter (Signed)
Spoke with patient's daughter Debbe Bales to confirm f/u appointment with NP for 06/20/19, daughter stated that she had not heard back from NP.  I have scheduled an In-person Palliative f/u visit for 06/20/19 @ 1:30 PM.

## 2019-06-20 ENCOUNTER — Other Ambulatory Visit: Payer: Medicare Other | Admitting: Nurse Practitioner

## 2019-06-20 ENCOUNTER — Other Ambulatory Visit: Payer: Self-pay

## 2019-06-20 ENCOUNTER — Encounter: Payer: Self-pay | Admitting: Nurse Practitioner

## 2019-06-20 DIAGNOSIS — Z515 Encounter for palliative care: Secondary | ICD-10-CM

## 2019-06-20 DIAGNOSIS — I272 Pulmonary hypertension, unspecified: Secondary | ICD-10-CM

## 2019-06-20 NOTE — Progress Notes (Signed)
[facility] Consult Note Telephone: [phone]  Fax: [phone]  PATIENT NAME: Sheryl Suarez DOB: May 25, 1923 MRN: [id number]  PRIMARY CARE PROVIDER:   Leonel Ramsay, MD  REFERRING PROVIDER:  Leonel Ramsay, MD [place],  [place] [address] RESPONSIBLE PARTY:   Daughter [name]  RECOMMENDATIONS and PLAN: 1.ACP: DNR, in vynca; refer for hospice  Focus on comfort  2.Palliative care encounter; Palliative medicine team will continue to support patient, patient's family, and medical team. Visit consisted of counseling and education dealing with the complex and emotionally intense issues of symptom management and palliative care in the setting of serious and potentially life-threatening illness  I spent 60 minutes providing this consultation,  from 1:00pm to 2:00pm. More than 50% of the time in this consultation was spent coordinating communication.   HISTORY OF PRESENT ILLNESS:  Sheryl Suarez is a 86 y.o. year old female with multiple medical problems including includingCoronary artery disease, Congestive heart failure, atrial fibrillation, breast cancer in remission, hypertension, history of GI bleed, hyperlipidemia, mastectomy, cholecystectomy, appendectomy, abdominal hysterectomy, right orif elbow. Hospitalised 11 / 24 / 2020 to 40 / 30 / 2020 after multiple falls. She was living alone. She was found to have acute distracted fracture of the right all cranial process of the proximal ulna and some hemarthrosis. Chest x-ray mildly displaced fractures of anterior lateral aspect of right second and third ribs. She did go to surgery for orif of elbow. Hospitalized 3 / 2 / 2021 to 3/4 / 2021 for acute hypoxic respiratory failure improve with oxygen, acute diastolic congestive heart failure with normal EF. Chest x-ray could not rule out multifocal pneumonia so she received antibiotic therapy. Frequent falls. Family  noticed Sheryl Suarez dragging her right leg. MRI done negative for stroke already on aspirin. Chronic atrial fibrillation not on anticoagulation secondary to fall risk though does take aspirin. Sheryl Suarez was discharged to short-term rehab at [facility]. Sheryl Suarez completed short-term rehab and then was discharged home. Prior to hospitalization and 11 / 2020 she was living independently by herself. I visited Sheryl Suarez face-to-face appointment follow up palliative care visit. Sheryl Suarez continues to reside at home, her daughter Sheryl Suarez has moved in with her now. We talked about purpose of palliative care visit. We talked about how Sheryl Suarez is doing. Sheryl Suarez and I talked separately. Sheryl Suarez she continues to overall decline. Send the Suarez that she sleeps a lot throughout the day. Sheryl Suarez she is declining to do the best you can with therapy. She is only able to walk a couple feet with her walker before she has to sit down, she [place] tired. Appetite is slightly better. We talked about symptoms of pain when she does not experience. We talked about fatigue, weakness what she does feel like is progressing. We talked about energy conservation. Sheryl Suarez does feed herself. We reviewed medical records past medical history, congestive heart failure visit, cardiology visit. We talked at length about chronic disease progression of pulmonary hypertension. We talked about Sheryl. Suarez work with therapy. Sheryl Suarez that she feels like she is not doing her best. We talked about her age of 86 years old and realistic expectations. We talked about therapy and what they have been doing with her. Sheryl Suarez sometimes they do not do anything but leg exercises for walk a couple feet with a walker. Sheryl Suarez they just got the lift chair. We talked about Sheryl Suarez  feeling the overall decline, loss of Independence, grieving process. We talked about coping strategies. We talked about Sheryl. Suarez sharing that  she feels like she is at the end part of her life. We talked about role of palliative care and plan of care. We talked about option of Hospice Services as she was found eligible by Hospice Physicians. We talked about services that Hospice would provide under the Medicare benefit. Sheryl Suarez and Sheryl Suarez Suarez they want to proceed with hospice referral. We talked about primary provider. We talked about in office appointment Sheryl Suarez that she does not feel like she could get Sheryl Suarez to the office. Offer to transition to a in-home provider. Sheryl Suarez would like to proceed with in-home provider, recommended [name] NP. [name] NP in agreement to assume Primary Care. [name] gave order for [facility]. Therapeutic listening and emotional support provided. Contact information provided. Questions answered to satisfaction. Palliative Care was asked to help to continue to address goals of care.   CODE STATUS: DNR  PPS: 40% HOSPICE ELIGIBILITY/DIAGNOSIS: yes per Hospice Physician; Pulmonary HTN  PAST MEDICAL HISTORY:  Past Medical History:  Diagnosis Date  . Arrhythmia    atrial fibrillation  . Breast cancer ([place])    remission  . CHF (congestive heart failure) ([place])   . Coronary artery disease   . Hypertension     SOCIAL HX:  Social History   Tobacco Use  . Smoking status: Never Smoker  . Smokeless tobacco: Never Used  Substance Use Topics  . Alcohol use: No    ALLERGIES: No Known Allergies   PERTINENT MEDICATIONS:  Outpatient Encounter Medications as of 06/20/2019  Medication Sig  . acetaminophen (TYLENOL) 325 MG tablet Take 2 tablets (650 mg total) by mouth every 6 (six) hours as needed for mild pain or headache.  Marland Kitchen aspirin EC 81 MG tablet Take 81 mg by mouth daily.  . Calcium Carbonate-Vitamin D (CALCIUM 600+D) 600-400 MG-UNIT tablet Take 1 tablet by mouth daily.   . COMBIVENT RESPIMAT 20-100 MCG/ACT AERS respimat Inhale 2 puffs into the lungs 4 (four) times daily as  needed.  . furosemide (LASIX) 20 MG tablet Take 1 tablet (20 mg total) by mouth 2 (two) times daily.  . isosorbide mononitrate (IMDUR) 30 MG 24 hr tablet Take 30 mg by mouth daily.  Marland Kitchen lisinopril (ZESTRIL) 2.5 MG tablet Take 1 tablet (2.5 mg total) by mouth daily.  Marland Kitchen lovastatin (MEVACOR) 40 MG tablet Take 40 mg by mouth at bedtime.  . metoprolol tartrate (LOPRESSOR) 50 MG tablet Take 1 tablet (50 mg total) by mouth 2 (two) times daily.   No facility-administered encounter medications on file as of 06/20/2019.    PHYSICAL EXAM:   General: NAD, frail appearing, thin, elderly pleasant female Cardiovascular: regular rate and rhythm Pulmonary: clear ant fields Neurological: generalized weakness, walks with walker  Durrel Mcnee Ihor Gully, NP

## 2020-07-11 ENCOUNTER — Encounter (HOSPITAL_COMMUNITY): Payer: Self-pay | Admitting: Student in an Organized Health Care Education/Training Program

## 2020-07-11 ENCOUNTER — Inpatient Hospital Stay (HOSPITAL_COMMUNITY)
Admission: EM | Admit: 2020-07-11 | Discharge: 2020-07-23 | DRG: 061 | Disposition: A | Discharge status: Skilled Nursing Facility | Payer: Medicare Other | Source: Skilled Nursing Facility | Attending: Neurology | Admitting: Neurology

## 2020-07-11 ENCOUNTER — Emergency Department (HOSPITAL_COMMUNITY): Payer: Medicare Other

## 2020-07-11 ENCOUNTER — Other Ambulatory Visit: Payer: Self-pay

## 2020-07-11 DIAGNOSIS — A419 Sepsis, unspecified organism: Secondary | ICD-10-CM | POA: Diagnosis not present

## 2020-07-11 DIAGNOSIS — R4701 Aphasia: Secondary | ICD-10-CM | POA: Diagnosis present

## 2020-07-11 DIAGNOSIS — N390 Urinary tract infection, site not specified: Secondary | ICD-10-CM | POA: Diagnosis not present

## 2020-07-11 DIAGNOSIS — I2510 Atherosclerotic heart disease of native coronary artery without angina pectoris: Secondary | ICD-10-CM | POA: Diagnosis present

## 2020-07-11 DIAGNOSIS — N179 Acute kidney failure, unspecified: Secondary | ICD-10-CM | POA: Diagnosis present

## 2020-07-11 DIAGNOSIS — Z515 Encounter for palliative care: Secondary | ICD-10-CM | POA: Diagnosis not present

## 2020-07-11 DIAGNOSIS — G8191 Hemiplegia, unspecified affecting right dominant side: Secondary | ICD-10-CM | POA: Diagnosis present

## 2020-07-11 DIAGNOSIS — Z7189 Other specified counseling: Secondary | ICD-10-CM

## 2020-07-11 DIAGNOSIS — Z66 Do not resuscitate: Secondary | ICD-10-CM | POA: Diagnosis present

## 2020-07-11 DIAGNOSIS — I63432 Cerebral infarction due to embolism of left posterior cerebral artery: Principal | ICD-10-CM | POA: Diagnosis present

## 2020-07-11 DIAGNOSIS — L89152 Pressure ulcer of sacral region, stage 2: Secondary | ICD-10-CM | POA: Diagnosis present

## 2020-07-11 DIAGNOSIS — R471 Dysarthria and anarthria: Secondary | ICD-10-CM | POA: Diagnosis present

## 2020-07-11 DIAGNOSIS — I482 Chronic atrial fibrillation, unspecified: Secondary | ICD-10-CM | POA: Diagnosis present

## 2020-07-11 DIAGNOSIS — E785 Hyperlipidemia, unspecified: Secondary | ICD-10-CM | POA: Diagnosis present

## 2020-07-11 DIAGNOSIS — I639 Cerebral infarction, unspecified: Secondary | ICD-10-CM | POA: Diagnosis present

## 2020-07-11 DIAGNOSIS — Z9013 Acquired absence of bilateral breasts and nipples: Secondary | ICD-10-CM

## 2020-07-11 DIAGNOSIS — I10 Essential (primary) hypertension: Secondary | ICD-10-CM | POA: Diagnosis not present

## 2020-07-11 DIAGNOSIS — Z90710 Acquired absence of both cervix and uterus: Secondary | ICD-10-CM | POA: Diagnosis not present

## 2020-07-11 DIAGNOSIS — R509 Fever, unspecified: Secondary | ICD-10-CM

## 2020-07-11 DIAGNOSIS — Z9181 History of falling: Secondary | ICD-10-CM | POA: Diagnosis not present

## 2020-07-11 DIAGNOSIS — Z79899 Other long term (current) drug therapy: Secondary | ICD-10-CM

## 2020-07-11 DIAGNOSIS — I5022 Chronic systolic (congestive) heart failure: Secondary | ICD-10-CM | POA: Diagnosis present

## 2020-07-11 DIAGNOSIS — R296 Repeated falls: Secondary | ICD-10-CM | POA: Diagnosis present

## 2020-07-11 DIAGNOSIS — Z8249 Family history of ischemic heart disease and other diseases of the circulatory system: Secondary | ICD-10-CM | POA: Diagnosis not present

## 2020-07-11 DIAGNOSIS — Z20822 Contact with and (suspected) exposure to covid-19: Secondary | ICD-10-CM | POA: Diagnosis present

## 2020-07-11 DIAGNOSIS — R29720 NIHSS score 20: Secondary | ICD-10-CM | POA: Diagnosis present

## 2020-07-11 DIAGNOSIS — I130 Hypertensive heart and chronic kidney disease with heart failure and stage 1 through stage 4 chronic kidney disease, or unspecified chronic kidney disease: Secondary | ICD-10-CM | POA: Diagnosis present

## 2020-07-11 DIAGNOSIS — Z853 Personal history of malignant neoplasm of breast: Secondary | ICD-10-CM | POA: Diagnosis not present

## 2020-07-11 DIAGNOSIS — Z7982 Long term (current) use of aspirin: Secondary | ICD-10-CM

## 2020-07-11 DIAGNOSIS — R112 Nausea with vomiting, unspecified: Secondary | ICD-10-CM | POA: Diagnosis present

## 2020-07-11 DIAGNOSIS — N183 Chronic kidney disease, stage 3 unspecified: Secondary | ICD-10-CM | POA: Diagnosis present

## 2020-07-11 DIAGNOSIS — Z9049 Acquired absence of other specified parts of digestive tract: Secondary | ICD-10-CM | POA: Diagnosis not present

## 2020-07-11 DIAGNOSIS — Z23 Encounter for immunization: Secondary | ICD-10-CM | POA: Diagnosis not present

## 2020-07-11 DIAGNOSIS — I6389 Other cerebral infarction: Secondary | ICD-10-CM | POA: Diagnosis not present

## 2020-07-11 DIAGNOSIS — B9620 Unspecified Escherichia coli [E. coli] as the cause of diseases classified elsewhere: Secondary | ICD-10-CM | POA: Diagnosis present

## 2020-07-11 DIAGNOSIS — L8990 Pressure ulcer of unspecified site, unspecified stage: Secondary | ICD-10-CM

## 2020-07-11 DIAGNOSIS — I672 Cerebral atherosclerosis: Secondary | ICD-10-CM | POA: Diagnosis present

## 2020-07-11 DIAGNOSIS — I63412 Cerebral infarction due to embolism of left middle cerebral artery: Secondary | ICD-10-CM | POA: Diagnosis not present

## 2020-07-11 HISTORY — DX: Cerebral infarction, unspecified: I63.9

## 2020-07-11 LAB — I-STAT CHEM 8, ED
BUN: 31 mg/dL — ABNORMAL HIGH (ref 8–23)
Calcium, Ion: 1.2 mmol/L (ref 1.15–1.40)
Chloride: 105 mmol/L (ref 98–111)
Creatinine, Ser: 0.8 mg/dL (ref 0.44–1.00)
Glucose, Bld: 96 mg/dL (ref 70–99)
HCT: 42 % (ref 36.0–46.0)
Hemoglobin: 14.3 g/dL (ref 12.0–15.0)
Potassium: 4.1 mmol/L (ref 3.5–5.1)
Sodium: 140 mmol/L (ref 135–145)
TCO2: 27 mmol/L (ref 22–32)

## 2020-07-11 LAB — CBC
HCT: 44.2 % (ref 36.0–46.0)
Hemoglobin: 13.7 g/dL (ref 12.0–15.0)
MCH: 29.1 pg (ref 26.0–34.0)
MCHC: 31 g/dL (ref 30.0–36.0)
MCV: 94 fL (ref 80.0–100.0)
Platelets: 223 10*3/uL (ref 150–400)
RBC: 4.7 MIL/uL (ref 3.87–5.11)
RDW: 14.4 % (ref 11.5–15.5)
WBC: 6 10*3/uL (ref 4.0–10.5)
nRBC: 0 % (ref 0.0–0.2)

## 2020-07-11 LAB — DIFFERENTIAL
Abs Immature Granulocytes: 0.01 10*3/uL (ref 0.00–0.07)
Basophils Absolute: 0 10*3/uL (ref 0.0–0.1)
Basophils Relative: 1 %
Eosinophils Absolute: 0.2 10*3/uL (ref 0.0–0.5)
Eosinophils Relative: 3 %
Immature Granulocytes: 0 %
Lymphocytes Relative: 26 %
Lymphs Abs: 1.6 10*3/uL (ref 0.7–4.0)
Monocytes Absolute: 0.5 10*3/uL (ref 0.1–1.0)
Monocytes Relative: 9 %
Neutro Abs: 3.7 10*3/uL (ref 1.7–7.7)
Neutrophils Relative %: 61 %

## 2020-07-11 LAB — COMPREHENSIVE METABOLIC PANEL
ALT: 9 U/L (ref 0–44)
AST: 15 U/L (ref 15–41)
Albumin: 3.2 g/dL — ABNORMAL LOW (ref 3.5–5.0)
Alkaline Phosphatase: 106 U/L (ref 38–126)
Anion gap: 8 (ref 5–15)
BUN: 24 mg/dL — ABNORMAL HIGH (ref 8–23)
CO2: 26 mmol/L (ref 22–32)
Calcium: 9.5 mg/dL (ref 8.9–10.3)
Chloride: 105 mmol/L (ref 98–111)
Creatinine, Ser: 1 mg/dL (ref 0.44–1.00)
GFR, Estimated: 52 mL/min — ABNORMAL LOW (ref >60)
Glucose, Bld: 98 mg/dL (ref 70–99)
Potassium: 3.9 mmol/L (ref 3.5–5.1)
Sodium: 139 mmol/L (ref 135–145)
Total Bilirubin: 0.9 mg/dL (ref 0.3–1.2)
Total Protein: 7 g/dL (ref 6.5–8.1)

## 2020-07-11 LAB — MRSA PCR SCREENING: MRSA by PCR: NEGATIVE

## 2020-07-11 LAB — CBG MONITORING, ED: Glucose-Capillary: 85 mg/dL (ref 70–99)

## 2020-07-11 LAB — PROTIME-INR
INR: 1.1 (ref 0.8–1.2)
Prothrombin Time: 14.2 seconds (ref 11.4–15.2)

## 2020-07-11 LAB — APTT: aPTT: 31 seconds (ref 24–36)

## 2020-07-11 LAB — SARS CORONAVIRUS 2 (TAT 6-24 HRS): SARS Coronavirus 2: NEGATIVE

## 2020-07-11 IMAGING — CT CT HEAD CODE STROKE
3 of 4 series · 15 of 47 positions shown, 18 images · non-contrast
Comparison: CT head [DATE]

CLINICAL DATA: Code stroke. Acute neuro deficit. Right facial droop
and weakness.

EXAM:
CT HEAD WITHOUT CONTRAST
TECHNIQUE: Contiguous axial images were obtained from the base of the skull
through the vertex without intravenous contrast.

[Series 3: head 2.0 h70h · axial · 0.44mm/px · z∈[-188,-68]mm · 9 of 74 slices shown, 12 images]
[im 7/74  brain]
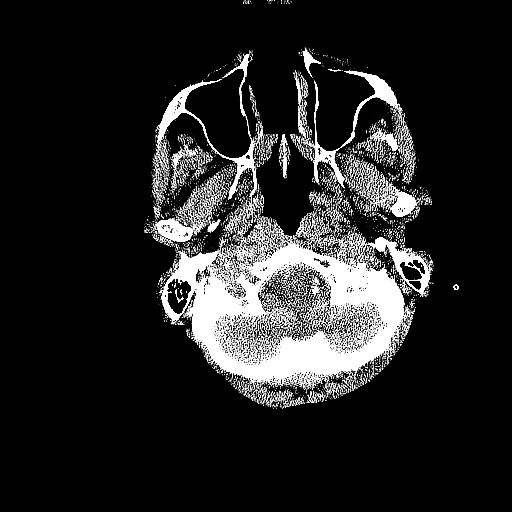
[im 7/74  bone]
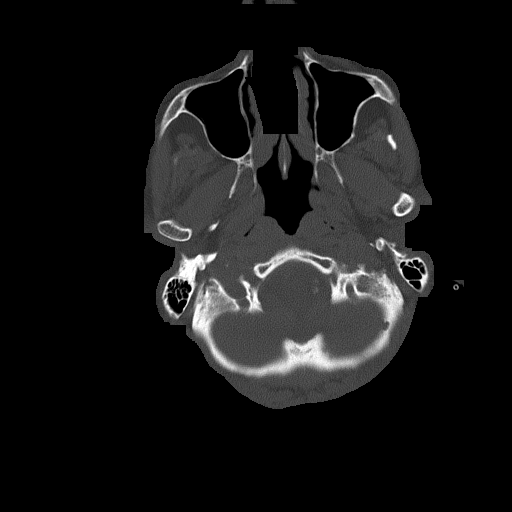
[im 14/74  brain]
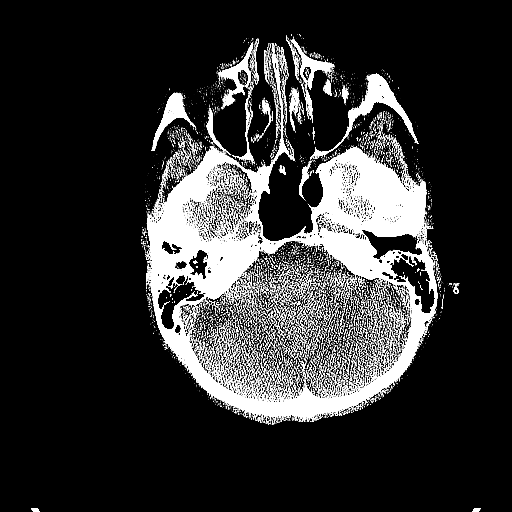
[im 21/74  brain]
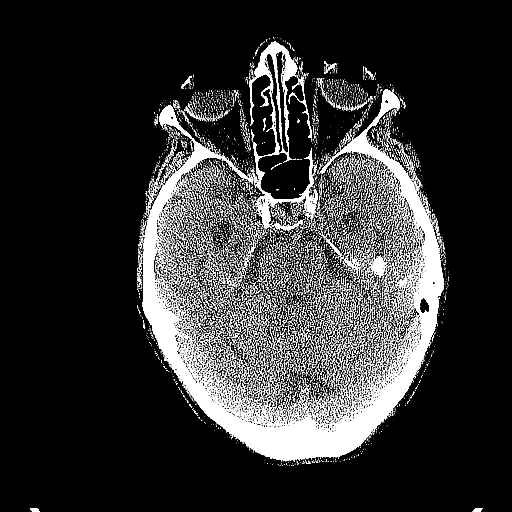
[im 28/74  brain]
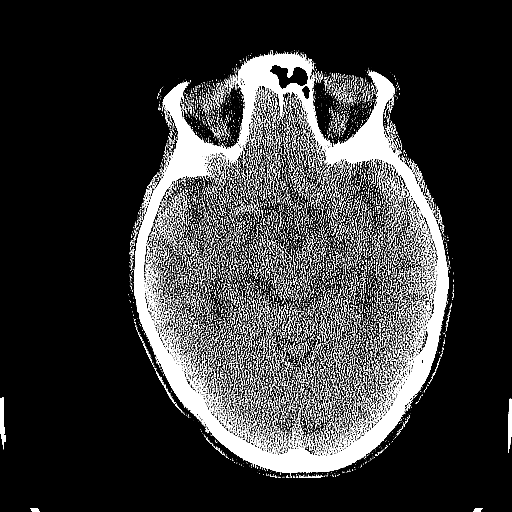
[im 39/74  brain]
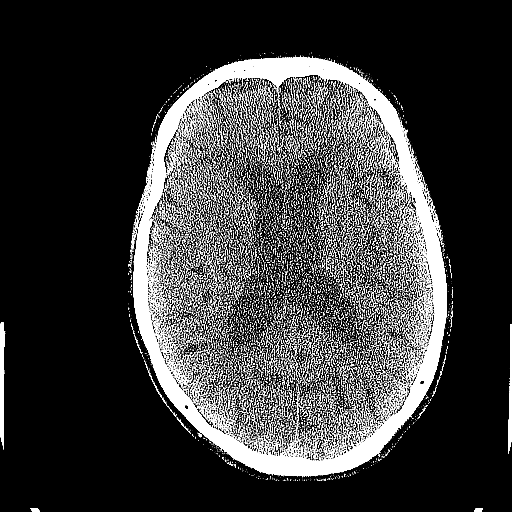
[im 39/74  bone]
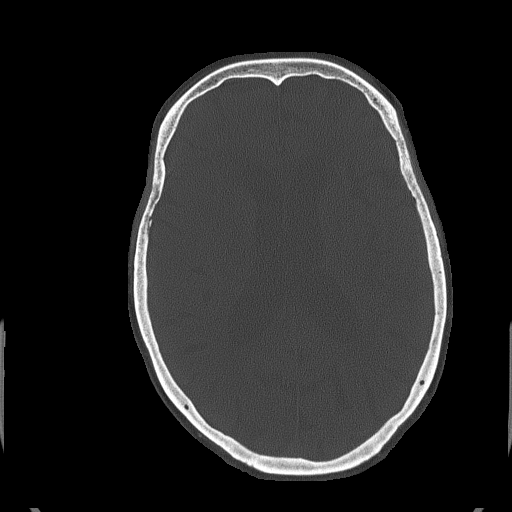
[im 46/74  brain]
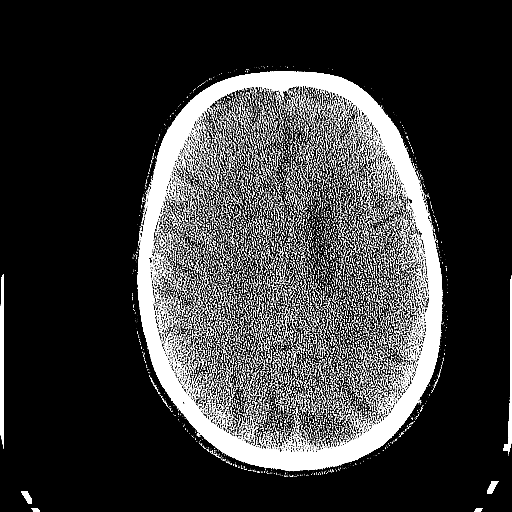
[im 53/74  brain]
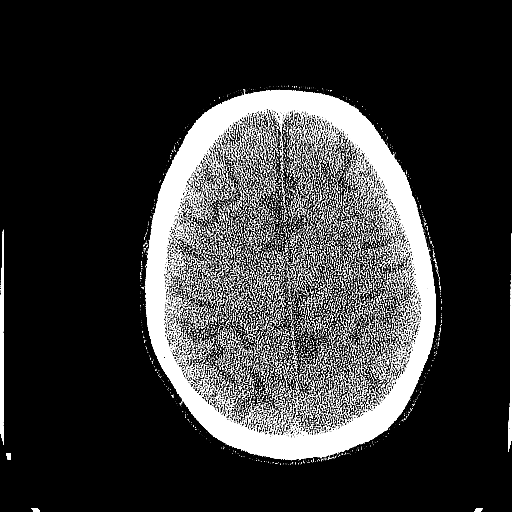
[im 60/74  brain]
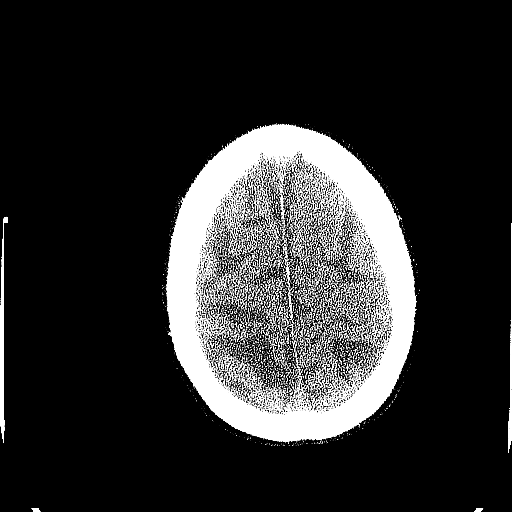
[im 67/74  brain]
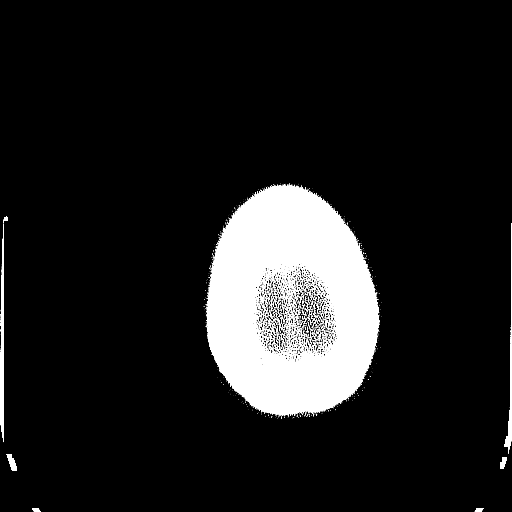
[im 67/74  bone]
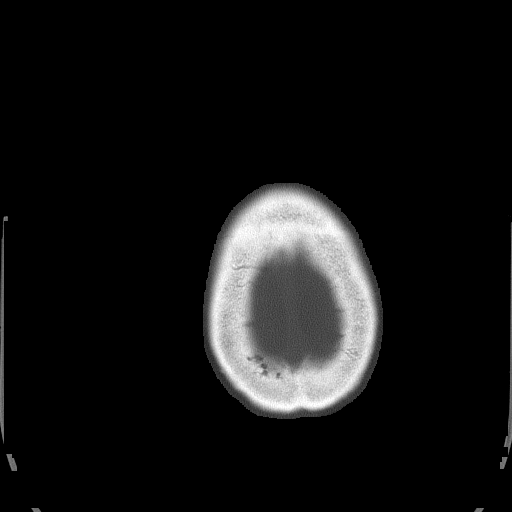

[Series 5: head 3.0 mpr cor · coronal · 0.29mm/px · 3 of 67 slices shown]
[im 23/67  brain]
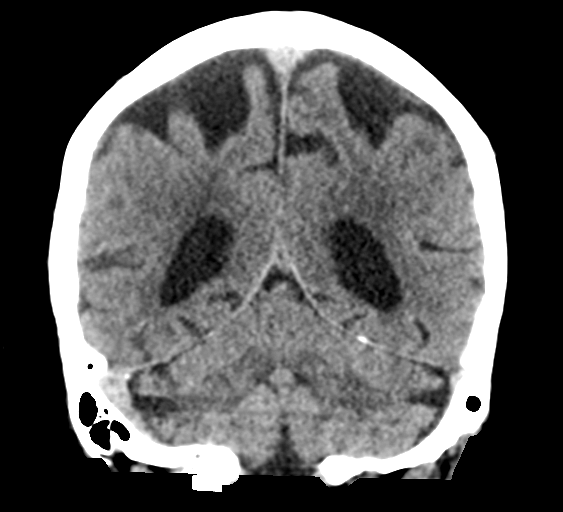
[im 30/67  brain]
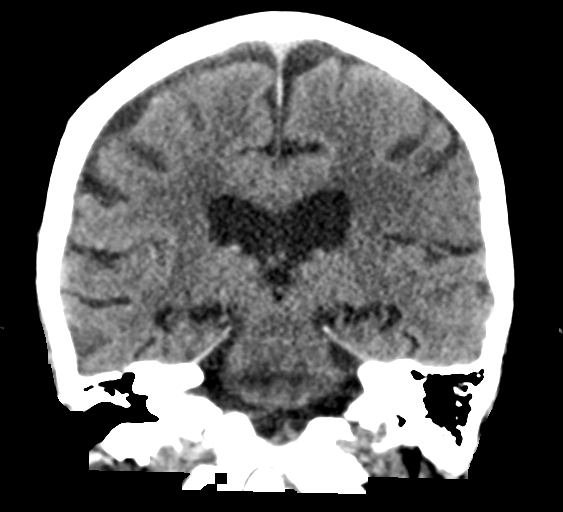
[im 37/67  brain]
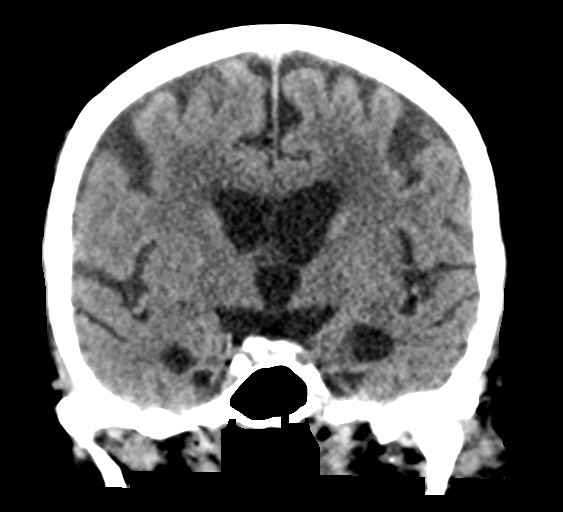

[Series 6: head 3.0 mpr sag · sagittal · 0.30mm/px · 3 of 50 slices shown]
[im 17/50  brain]
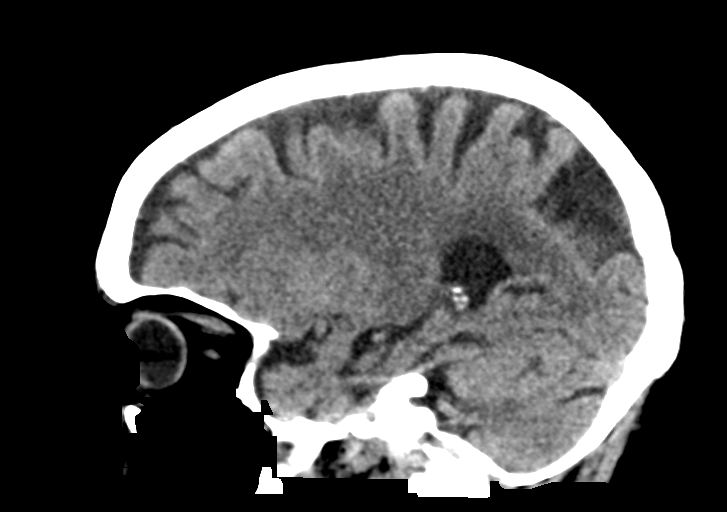
[im 25/50  brain]
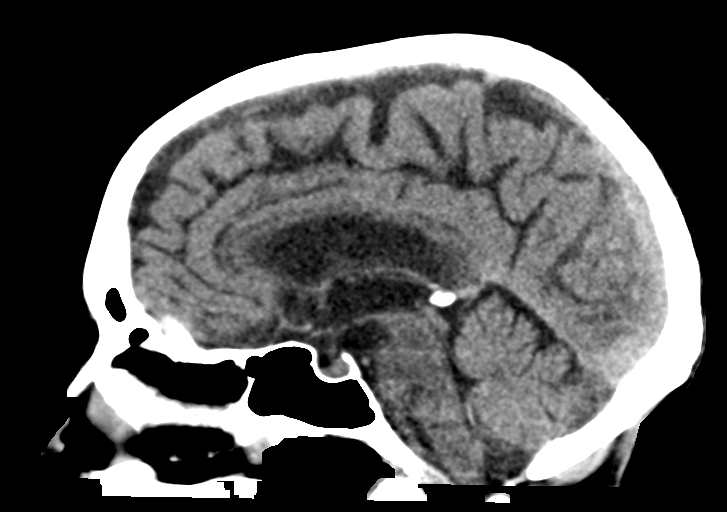
[im 33/50  brain]
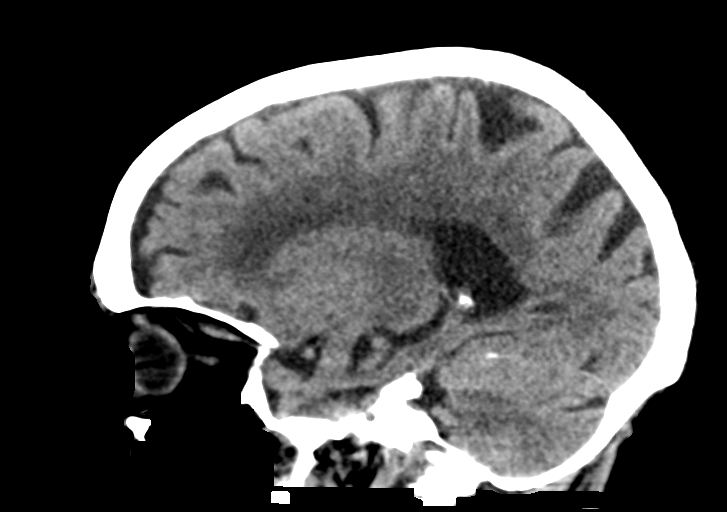

[15 of 47 positions shown; findings below may reference images not displayed]

FINDINGS: Brain: Generalized atrophy. Patchy white matter hypodensity
bilaterally unchanged from the prior study.

Negative for acute infarct, acute hemorrhage, or mass lesion. No
midline shift.

Vascular: Negative for hyperdense vessel

Skull: Negative

Sinuses/Orbits: Paranasal sinuses clear. Bilateral cataract
extraction

Other: None

ASPECTS ([place] Stroke Program Early CT Score)

- Ganglionic level infarction (caudate, lentiform nuclei, internal
capsule, insula, M1-M3 cortex): 7

- Supraganglionic infarction (M4-M6 cortex): 3

Total score (0-10 with 10 being normal): 10
IMPRESSION: 1. No acute abnormality.
2. ASPECTS is 10
3. Atrophy and chronic microvascular ischemic changes are stable
from the prior MRI
4. Code stroke imaging results were communicated on [DATE] at
[DATE] to provider NMULLA via text page

## 2020-07-11 IMAGING — CT CT ANGIO HEAD-NECK (W OR W/O PERF)
1 of 8 series · 14 of 47 positions shown · IV contrast (OMNI)
Comparison: CT head [DATE]

CLINICAL DATA: Acute neuro deficit. Right facial droop and
weakness.

EXAM:
CT ANGIOGRAPHY HEAD AND NECK
TECHNIQUE: Multidetector CT imaging of the head and neck was performed using
the standard protocol during bolus administration of intravenous
contrast. Multiplanar CT image reconstructions and MIPs were
obtained to evaluate the vascular anatomy. Carotid stenosis
measurements (when applicable) are obtained utilizing NASCET
criteria, using the distal internal carotid diameter as the
denominator.
CONTRAST:  50mL OMNIPAQUE IOHEXOL 350 MG/ML SOLN

[Series 12: thin · axial · 0.46mm/px · z∈[-381,-100]mm · 14 of 648 slices shown]
[im 44/648  brain]
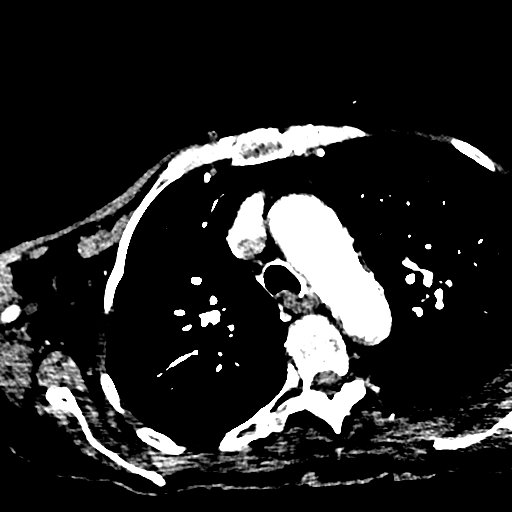
[im 87/648  bone]
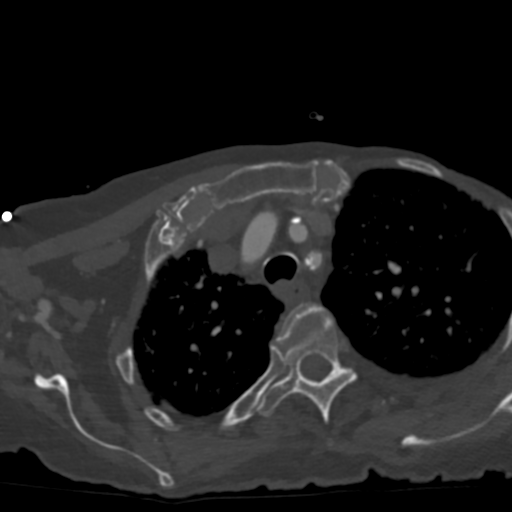
[im 130/648  brain]
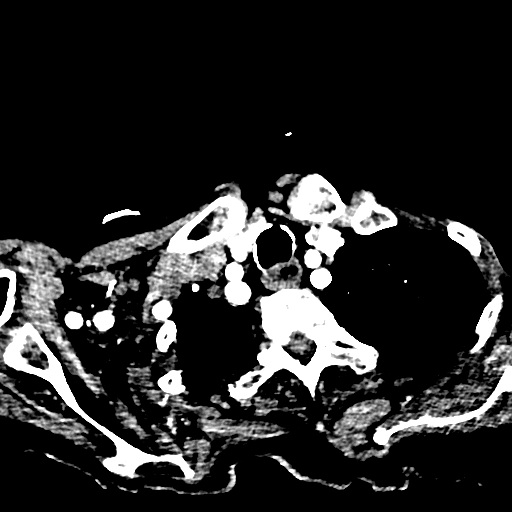
[im 173/648  bone]
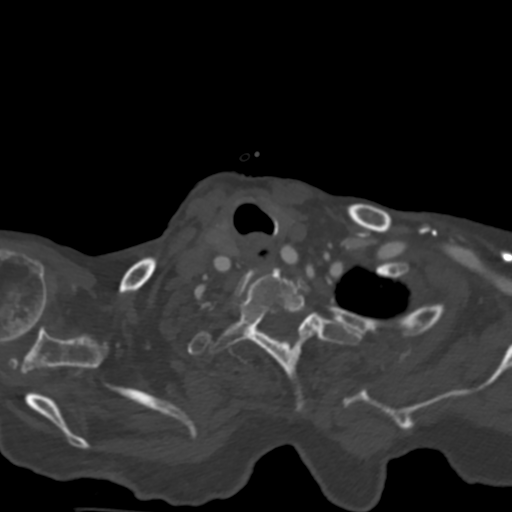
[im 216/648  brain]
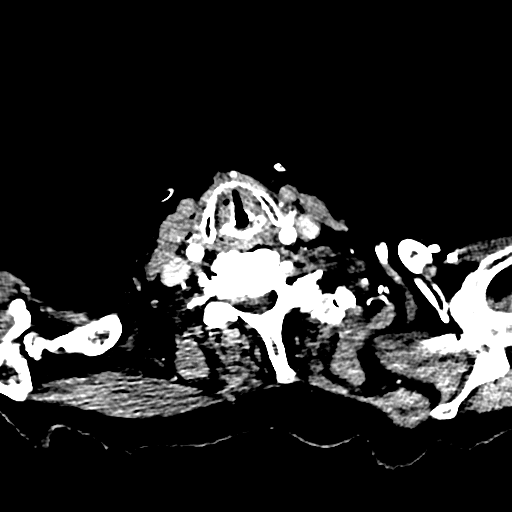
[im 259/648  bone]
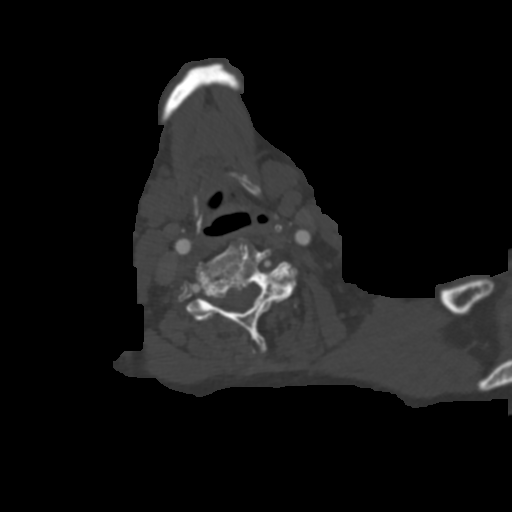
[im 302/648  brain]
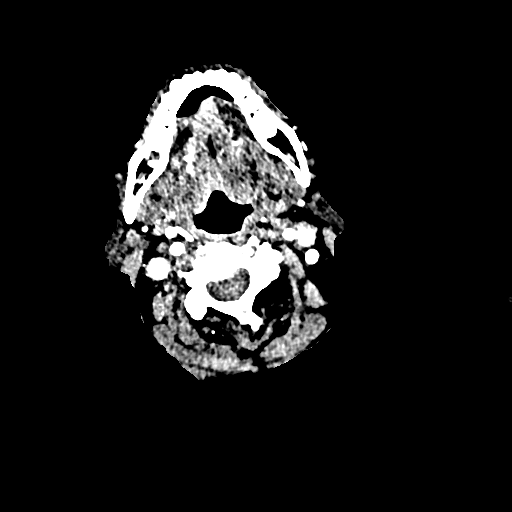
[im 346/648  bone]
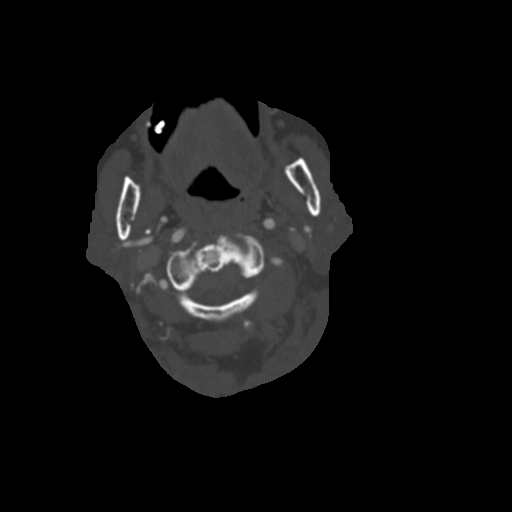
[im 389/648  brain]
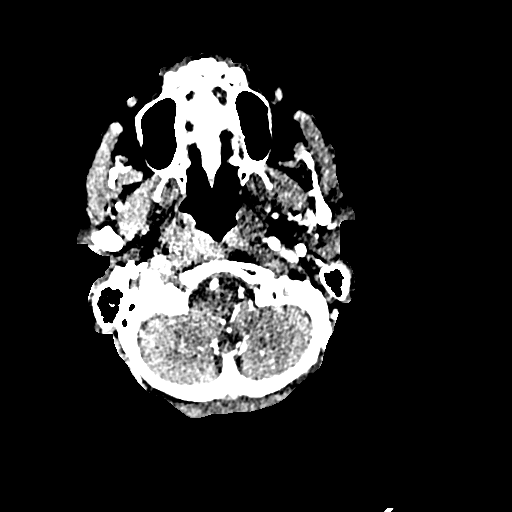
[im 432/648  bone]
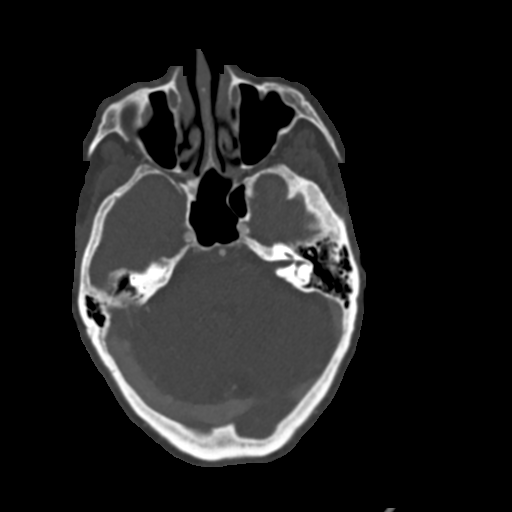
[im 475/648  brain]
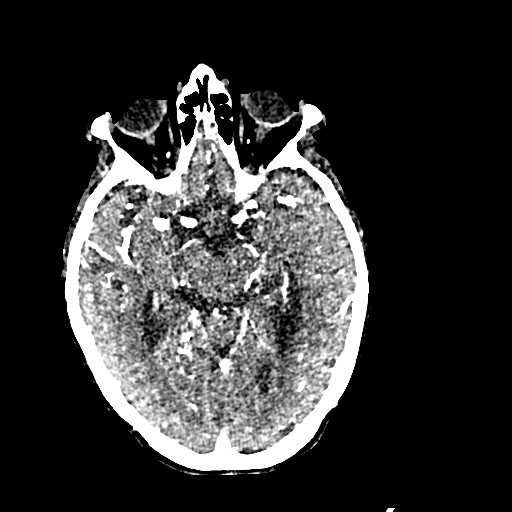
[im 518/648  bone]
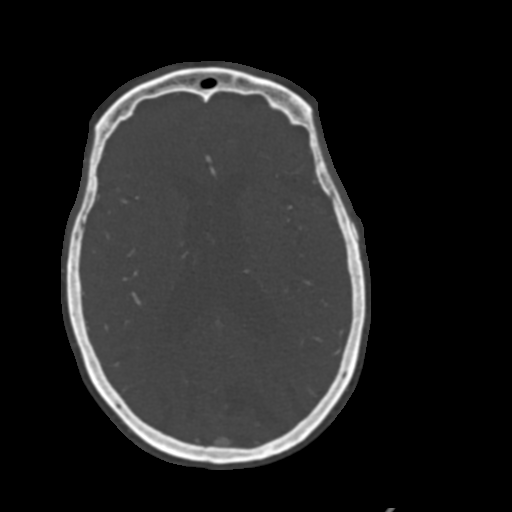
[im 561/648  brain]
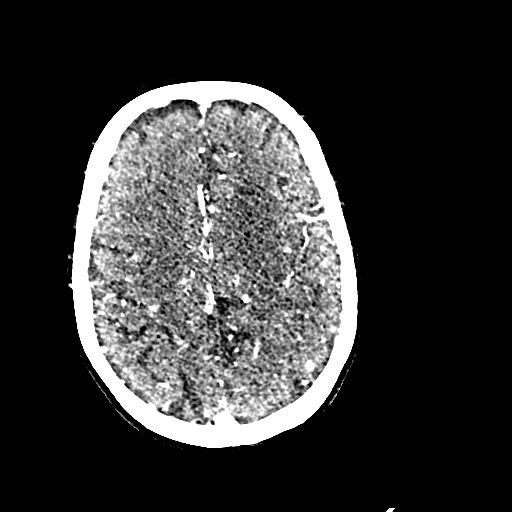
[im 604/648  bone]
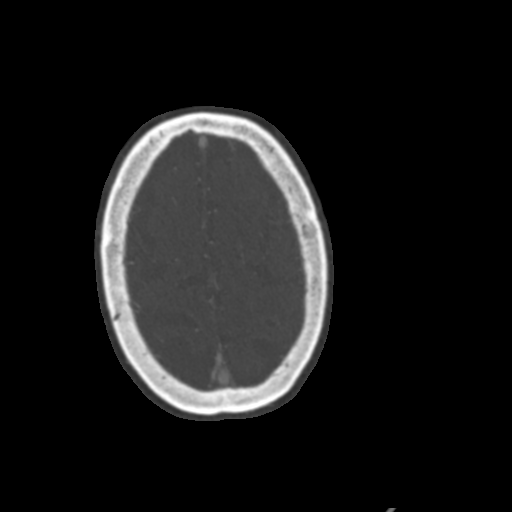

[14 of 47 positions shown; findings below may reference images not displayed]

FINDINGS: CTA NECK FINDINGS

Aortic arch: Extensive atherosclerotic calcification in the aortic
arch and proximal left subclavian artery with mild stenosis. Bovine
branching arch.

Right carotid system: Right carotid system widely patent. Mild
atherosclerotic calcification right carotid bifurcation

Left carotid system: Left carotid widely patent. Mild
atherosclerotic calcification left carotid bifurcation.

Vertebral arteries: Stenosis at the origin of the vertebral artery
bilaterally due to atherosclerotic disease. Right vertebral artery
patent to the basilar without additional stenosis. Irregular
stenosis distal left vertebral artery at the foramen magnum and V4
segment.

Skeleton: Cervicothoracic scoliosis.  Advanced cervical spondylosis.

Other neck: Negative for mass or edema in the neck.

Upper chest: Lung apices clear bilaterally without acute
abnormality.

Review of the MIP images confirms the above findings

CTA HEAD FINDINGS

Anterior circulation: Atherosclerotic calcification throughout the
cavernous carotid with moderate stenosis bilaterally. Anterior and
middle cerebral arteries patent bilaterally. There is a moderate
stenosis of the left M3 segment supplying the frontal parietal lobe.
M1 segments widely patent bilaterally. No right MCA stenosis. Both
anterior cerebral arteries are patent without stenosis.

Posterior circulation: Moderate to severe stenosis distal left
vertebral artery at the skull base and V4 segment. Right vertebral
artery widely patent. PICA patent bilaterally. Basilar widely
patent. Right AICA patent. Superior cerebellar artery patent
bilaterally. Right posterior cerebral artery widely patent

Occlusion left P2/P3 segment

Venous sinuses: Normal venous enhancement

Anatomic variants: None

Review of the MIP images confirms the above findings
IMPRESSION: 1. Occlusion left P2/P3 segment likely acute
2. Moderate to severe stenosis left M3 segment supplying the left
frontal parietal lobe.
3. Advanced atherosclerotic disease aortic arch and proximal left
subclavian artery
4. Mild atherosclerotic disease in the carotid bifurcation
bilaterally. Moderate stenosis in the cavernous carotid bilaterally
due to atherosclerotic disease.
5. Moderate to severe stenosis distal left vertebral artery.
6. These results were called by telephone at the time of
interpretation on [DATE] at [DATE] to provider JIBRILA ,
who verbally acknowledged these results.

## 2020-07-11 MED ORDER — SODIUM CHLORIDE 0.9 % IV SOLN
50.0000 mL | Freq: Once | INTRAVENOUS | Status: AC
  Administered 2020-07-11: 50 mL via INTRAVENOUS

## 2020-07-11 MED ORDER — SODIUM CHLORIDE 0.9% FLUSH
3.0000 mL | Freq: Once | INTRAVENOUS | Status: AC
Start: 2020-07-11 — End: 2020-07-11
  Administered 2020-07-11: 3 mL via INTRAVENOUS

## 2020-07-11 MED ORDER — ACETAMINOPHEN 650 MG RE SUPP: 650.0000 mg | RECTAL | Status: DC | PRN

## 2020-07-11 MED ORDER — CHLORHEXIDINE GLUCONATE CLOTH 2 % EX PADS
6.0000 | MEDICATED_PAD | Freq: Every day | CUTANEOUS | Status: DC
  Administered 2020-07-13 – 2020-07-16 (×2): 6 via TOPICAL

## 2020-07-11 MED ORDER — SODIUM CHLORIDE 0.9 % IV SOLN: INTRAVENOUS | Status: DC

## 2020-07-11 MED ORDER — ALTEPLASE (STROKE) FULL DOSE INFUSION
0.9000 mg/kg | Freq: Once | INTRAVENOUS | Status: AC
  Administered 2020-07-11: 49.3 mg via INTRAVENOUS
  Filled 2020-07-11: qty 100

## 2020-07-11 MED ORDER — LABETALOL HCL 5 MG/ML IV SOLN: 20.0000 mg | Freq: Once | INTRAVENOUS | Status: DC

## 2020-07-11 MED ORDER — SENNOSIDES-DOCUSATE SODIUM 8.6-50 MG PO TABS: 1.0000 | ORAL_TABLET | Freq: Every evening | ORAL | Status: DC | PRN

## 2020-07-11 MED ORDER — CLEVIDIPINE BUTYRATE 0.5 MG/ML IV EMUL
0.0000 mg/h | INTRAVENOUS | Status: DC
  Administered 2020-07-11: 1 mg/h via INTRAVENOUS

## 2020-07-11 MED ORDER — ORAL CARE MOUTH RINSE
15.0000 mL | Freq: Two times a day (BID) | OROMUCOSAL | Status: DC
  Administered 2020-07-12 – 2020-07-23 (×23): 15 mL via OROMUCOSAL

## 2020-07-11 MED ORDER — PANTOPRAZOLE SODIUM 40 MG IV SOLR
40.0000 mg | Freq: Every day | INTRAVENOUS | Status: DC
  Administered 2020-07-11 – 2020-07-14 (×4): 40 mg via INTRAVENOUS
  Filled 2020-07-11 (×4): qty 40

## 2020-07-11 MED ORDER — STROKE: EARLY STAGES OF RECOVERY BOOK
Freq: Once | Status: AC
  Filled 2020-07-11: qty 1

## 2020-07-11 MED ORDER — IOHEXOL 350 MG/ML SOLN
50.0000 mL | Freq: Once | INTRAVENOUS | Status: AC | PRN
  Administered 2020-07-11: 50 mL via INTRAVENOUS

## 2020-07-11 MED ORDER — ACETAMINOPHEN 325 MG PO TABS
650.0000 mg | ORAL_TABLET | ORAL | Status: DC | PRN
  Administered 2020-07-15 – 2020-07-17 (×3): 650 mg via ORAL
  Filled 2020-07-11 (×3): qty 2

## 2020-07-11 MED ORDER — ACETAMINOPHEN 160 MG/5ML PO SOLN: 650.0000 mg | ORAL | Status: DC | PRN

## 2020-07-11 NOTE — ED Notes (Signed)
Attempted report x1. 

## 2020-07-11 NOTE — ED Provider Notes (Signed)
Sheryl Suarez Provider Note   CSN: [id number] Arrival date & time: 07/11/20  1202     History CC: Weakness, garbled speech  Sheryl Suarez is a 87 y.o. female history of A. fib (not on anticoagulation), history of coronary disease, hypertension, presenting from a nursing facility with concern for right-sided weakness.  Patient was noted to be in her usual state of health this morning by staff at the nursing facility.  Family went to visit her approximately 1 hour prior to arrival and she was noted to have right-sided weakness which was new.  At baseline she is able to ambulate independently, but could not.  Patient self appears confused on arrival.  She is able to follow simple commands but has garbled speech and cannot provide additional history.  She is reportedly DNR/DNI per EMS report, and discussion with her family prior to leaving the facility.  This was confirmed by the nursing staff at her facility per EMS.  HPI     Past Medical History:  Diagnosis Date  . Arrhythmia    atrial fibrillation  . Breast cancer (Sheryl Suarez)    remission  . CHF (congestive heart failure) ([name])   . Coronary artery disease   . Hypertension     Patient Active Problem List   Diagnosis Date Noted  . Acute ischemic stroke (Sheryl Suarez) 07/11/2020  . Acute diastolic CHF (congestive heart failure) ([name])   . Acute respiratory failure with hypoxia (Sheryl Suarez)   . Acute congestive heart failure (Sheryl Suarez)   . Frequent falls   . Bradycardia   . HTN (hypertension) 04/23/2019  . HLD (hyperlipidemia) 04/23/2019  . UTI (urinary tract infection) 04/23/2019  . Closed fracture of right olecranon process   . Fall   . Generalized weakness   . Closed fracture dislocation of right elbow 01/15/2019  . Closed rib fracture 01/15/2019  . HTN (hypertension), benign 01/15/2019  . Acute on chronic systolic CHF (congestive heart failure) (Sheryl Suarez) 01/15/2019  . CAD (coronary artery disease) 01/15/2019  .  Atrial fibrillation, chronic (Sheryl Suarez) 01/15/2019  . Rhabdomyolysis 01/15/2019  . GI bleed 08/18/2015    Past Surgical History:  Procedure Laterality Date  . ABDOMINAL HYSTERECTOMY    . APPENDECTOMY    . BREAST IMPLANT EXCHANGE    . CHOLECYSTECTOMY    . ESOPHAGOGASTRODUODENOSCOPY (EGD) WITH PROPOFOL N/A 08/20/2015   Procedure: ESOPHAGOGASTRODUODENOSCOPY (EGD) WITH PROPOFOL;  Surgeon: Lollie Sails, MD;  Location: [facility];  Service: Endoscopy;  Laterality: N/A;  . MASTECTOMY Bilateral   . ORIF ELBOW FRACTURE Right 01/16/2019   Procedure: OPEN REDUCTION INTERNAL FIXATION (ORIF) ELBOW/OLECRANON FRACTURE;  Surgeon: Earnestine Leys, MD;  Location: [facility] ORS;  Service: Orthopedics;  Laterality: Right;     OB History   No obstetric history on file.     Family History  Problem Relation Age of Onset  . CAD Mother   . CAD Father     Social History   Tobacco Use  . Smoking status: Never Smoker  . Smokeless tobacco: Never Used  Vaping Use  . Vaping Use: Never used  Substance Use Topics  . Alcohol use: No  . Drug use: Never    Home Medications Prior to Admission medications   Medication Sig Start Date End Date Taking? Authorizing Provider  acetaminophen (TYLENOL) 325 MG tablet Take 2 tablets (650 mg total) by mouth every 6 (six) hours as needed for mild pain or headache. 01/21/19   Lorella Nimrod, MD  aspirin EC 81 MG tablet  Take 81 mg by mouth daily.    [provider]  Calcium Carbonate-Vitamin D (CALCIUM 600+D) 600-400 MG-UNIT tablet Take 1 tablet by mouth daily.     [provider]  COMBIVENT RESPIMAT 20-100 MCG/ACT AERS respimat Inhale 2 puffs into the lungs 4 (four) times daily as needed. 01/08/19   [provider]  furosemide (LASIX) 20 MG tablet Take 1 tablet (20 mg total) by mouth 2 (two) times daily. 04/25/19   Loletha Grayer, MD  isosorbide mononitrate (IMDUR) 30 MG 24 hr tablet Take 30 mg by mouth daily. 12/10/18   [provider]   lisinopril (ZESTRIL) 2.5 MG tablet Take 1 tablet (2.5 mg total) by mouth daily. 04/26/19   Loletha Grayer, MD  lovastatin (MEVACOR) 40 MG tablet Take 40 mg by mouth at bedtime. 01/06/19   [provider]  metoprolol tartrate (LOPRESSOR) 50 MG tablet Take 1 tablet (50 mg total) by mouth 2 (two) times daily. 04/25/19   Loletha Grayer, MD    Allergies    Patient has no known allergies.  Review of Systems   Review of Systems  Unable to perform ROS: Patient nonverbal (level 5 caveat)    Physical Exam Updated Vital Signs Wt 54.8 kg   BMI 22.10 kg/m   Physical Exam Constitutional:      General: She is not in acute distress.    Comments: Garbled speech  HENT:     Head: Normocephalic and atraumatic.  Eyes:     Conjunctiva/sclera: Conjunctivae normal.     Pupils: Pupils are equal, round, and reactive to light.  Cardiovascular:     Rate and Rhythm: Normal rate and regular rhythm.  Pulmonary:     Effort: Pulmonary effort is normal. No respiratory distress.  Skin:    General: Skin is warm and dry.  Neurological:     Mental Status: She is alert.     Comments: 3/5 strength in right upper and lower extremities  Psychiatric:        Behavior: Behavior normal.     ED Results / Procedures / Treatments   Labs (all labs ordered are listed, but only abnormal results are displayed) Labs Reviewed  I-STAT CHEM 8, ED - Abnormal; Notable for the following components:      Result Value   BUN 31 (*)    All other components within normal limits  CBC  DIFFERENTIAL  PROTIME-INR  APTT  COMPREHENSIVE METABOLIC PANEL  CBG MONITORING, ED    EKG None  Radiology CT HEAD CODE STROKE WO CONTRAST  Result Date: 07/11/2020 CLINICAL DATA:  Code stroke. Acute neuro deficit. Right facial droop and weakness. EXAM: CT HEAD WITHOUT CONTRAST TECHNIQUE: Contiguous axial images were obtained from the base of the skull through the vertex without intravenous contrast. COMPARISON:  CT head  04/23/2019 FINDINGS: Brain: Generalized atrophy. Patchy white matter hypodensity bilaterally unchanged from the prior study. Negative for acute infarct, acute hemorrhage, or mass lesion. No midline shift. Vascular: Negative for hyperdense vessel Skull: Negative Sinuses/Orbits: Paranasal sinuses clear. Bilateral cataract extraction Other: None ASPECTS ([place] Stroke Program Early CT Score) - Ganglionic level infarction (caudate, lentiform nuclei, internal capsule, insula, M1-M3 cortex): 7 - Supraganglionic infarction (M4-M6 cortex): 3 Total score (0-10 with 10 being normal): 10 IMPRESSION: 1. No acute abnormality. 2. ASPECTS is 10 3. Atrophy and chronic microvascular ischemic changes are stable from the prior MRI 4. Code stroke imaging results were communicated on 07/11/2020 at 12:25 pm to provider Rory Percy via text page Electronically Signed  By: Franchot Gallo M.D.   On: 07/11/2020 12:25    Procedures Procedures   Medications Ordered in ED Medications  sodium chloride flush (NS) 0.9 % injection 3 mL (has no administration in time range)  alteplase (ACTIVASE) 1 mg/mL infusion 49.3 mg (has no administration in time range)    Followed by  0.9 %  sodium chloride infusion (has no administration in time range)   stroke: mapping our early stages of recovery book (has no administration in time range)  0.9 %  sodium chloride infusion (has no administration in time range)  acetaminophen (TYLENOL) tablet 650 mg (has no administration in time range)    Or  acetaminophen (TYLENOL) 160 MG/5ML solution 650 mg (has no administration in time range)    Or  acetaminophen (TYLENOL) suppository 650 mg (has no administration in time range)  senna-docusate (Senokot-S) tablet 1 tablet (has no administration in time range)  pantoprazole (PROTONIX) injection 40 mg (has no administration in time range)  labetalol (NORMODYNE) injection 20 mg (has no administration in time range)    And  clevidipine (CLEVIPREX) infusion  0.5 mg/mL (has no administration in time range)  iohexol (OMNIPAQUE) 350 MG/ML injection 50 mL (50 mLs Intravenous Contrast Given 07/11/20 1232)    ED Course  I have reviewed the triage vital signs and the nursing notes.  Pertinent labs & imaging results that were available during my care of the patient were reviewed by me and considered in my medical decision making (see chart for details).  87 year old female presenting here as a code stroke with left-sided weakness, expressive aphasia, witnessed onset this morning.  She was seen immediately by neurology and myself upon arrival.  Her airway was intact.  She was able to follow commands but cannot express what was bothering her, and garbled speech.  He was taken to CT with neurology, subsequently was ordered and given a dose of tPA by the neurologist with high clinical concern for vascular occlusion.  Risk assessment was performed by the neurologist in conjunction with the patient's family.    White blood cell count was normal.  Hemoglobin is normal.  Glucose was normal.  I personally reviewed his labs.  She will be admitted to ICU.  MRI and additional tests per neurologist service.   Clinical Course as of 07/11/20 1244  Sat Jul 11, 2020  1207 Pt assessed on arrival.  Airway intact.  Right sided weakness.  Dr Rory Percy from neurology at bedside, taking her to CT.  She is DNR per report from EMS, having spoken to her family at her nursing facility. [MT]  6767 tPA administered by neurology MD Dr Rory Percy, who has spoken with patient's family member.  She will need ICU admission.  She remains DNR/DNI. [MT]    Clinical Course User Index [MT] Matalyn Nawaz, Carola Rhine, MD    Final Clinical Impression(s) / ED Diagnoses Final diagnoses:  Cerebrovascular accident (CVA), unspecified mechanism [facility])    Rx / DC Orders ED Discharge Orders    None       Wyvonnia Dusky, MD 07/11/20 1654

## 2020-07-11 NOTE — ED Triage Notes (Signed)
Pt arrived to ED via EMS from [facility] as a CODE STROKE. LKW [id number] when family went to nursing facility to visit pt and found pt as she presented to ED. S/S per EMS: R facial droop, nonverbal. EMS placed 20g L FA. VS: CBG 121, 97% on 3L for comfort, 142/60.

## 2020-07-11 NOTE — Progress Notes (Signed)
Upon arrival to ICU, wound found on sacrum that extends to both sides of buttocks. Small amount of open tissue surrounded by erythema. Also both heels noted to be bright pink and blanchable. New foam dressings applied to sacrum and heels.   Personal belongings on arrival: medical alert necklace, black and white shirt and two white socks. Belongings given to daughter. No belongings left at bedside.

## 2020-07-11 NOTE — H&P (Signed)
Neurology Consultation  Reason for Consult: Right-sided weakness Referring Physician: Dr. Langston Masker  CC: Right-sided weakness  History is obtained from: Chart, patient's family  HPI: Sheryl Suarez is a 87 y.o. female past medical of atrial fibrillation not on anticoagulation, CHF, coronary artery disease, hypertension, history of breast cancer now in remission, at some point on hospice but taken off of hospice because of improvement after medication adjustment, brought into the emergency room for evaluation of sudden onset of right-sided weakness and inability to talk. The patient was last seen well at 8:30 AM at a facility where she is.  She is usually able to use a walker and walk and has conversations fairly normally according to the facility staff.  They noticed after that she was not able to talk.  EMS was called.  She was positive on the LVO screen on their assessment and she was brought in to [facility] as an LVO positive code stroke. See detailed exam below- exam consistent with left hemispheric dysfunction. CT head negative for acute stroke. IV tPA risks and benefits given her advanced age and past the 3-hour window discussed with the family, risks of bleed explained in detail-family agreed to proceed. Not a candidate for thrombectomy due to modified Rankin score of 4 at baseline.   LKW: 8:30 AM on 07/11/2020 tpa given?:  He has Premorbid modified Rankin scale (mRS): 4  ROS:  Unable to obtain due to altered mental status.   Past Medical History:  Diagnosis Date  . Arrhythmia    atrial fibrillation  . Breast cancer ([place])    remission  . CHF (congestive heart failure) ([place])   . Coronary artery disease   . Hypertension    Family History  Problem Relation Age of Onset  . CAD Mother   . CAD Father    Social History:   reports that she has never smoked. She has never used smokeless tobacco. She reports that she does not drink alcohol and does not use  drugs.  Medications  Current Facility-Administered Medications:  .   stroke: mapping our early stages of recovery book, , Does not apply, Once, Amie Portland, MD .  0.9 %  sodium chloride infusion, , Intravenous, Continuous, Amie Portland, MD .  acetaminophen (TYLENOL) tablet 650 mg, 650 mg, Oral, Q4H PRN **OR** acetaminophen (TYLENOL) 160 MG/5ML solution 650 mg, 650 mg, Per Tube, Q4H PRN **OR** acetaminophen (TYLENOL) suppository 650 mg, 650 mg, Rectal, Q4H PRN, Amie Portland, MD .  labetalol (NORMODYNE) injection 20 mg, 20 mg, Intravenous, Once **AND** clevidipine (CLEVIPREX) infusion 0.5 mg/mL, 0-21 mg/hr, Intravenous, Continuous, Amie Portland, MD, Last Rate: 12 mL/hr at 07/11/20 1331, 6 mg/hr at 07/11/20 1331 .  pantoprazole (PROTONIX) injection 40 mg, 40 mg, Intravenous, QHS, Amie Portland, MD .  senna-docusate (Senokot-S) tablet 1 tablet, 1 tablet, Oral, QHS PRN, Amie Portland, MD  Current Outpatient Medications:  .  acetaminophen (TYLENOL) 325 MG tablet, Take 2 tablets (650 mg total) by mouth every 6 (six) hours as needed for mild pain or headache., Disp: 60 tablet, Rfl: 0 .  aspirin EC 81 MG tablet, Take 81 mg by mouth daily., Disp: , Rfl:  .  Calcium Carbonate-Vitamin D (CALCIUM 600+D) 600-400 MG-UNIT tablet, Take 1 tablet by mouth daily. , Disp: , Rfl:  .  COMBIVENT RESPIMAT 20-100 MCG/ACT AERS respimat, Inhale 2 puffs into the lungs 4 (four) times daily as needed., Disp: , Rfl:  .  furosemide (LASIX) 20 MG tablet, Take 1 tablet (20 mg  total) by mouth 2 (two) times daily., Disp: 60 tablet, Rfl: 0 .  isosorbide mononitrate (IMDUR) 30 MG 24 hr tablet, Take 30 mg by mouth daily., Disp: , Rfl:  .  lisinopril (ZESTRIL) 2.5 MG tablet, Take 1 tablet (2.5 mg total) by mouth daily., Disp: 30 tablet, Rfl: 0 .  lovastatin (MEVACOR) 40 MG tablet, Take 40 mg by mouth at bedtime., Disp: , Rfl:  .  metoprolol tartrate (LOPRESSOR) 50 MG tablet, Take 1 tablet (50 mg total) by mouth 2 (two) times  daily., Disp: 60 tablet, Rfl: 0   Exam: Current vital signs: BP 134/60   Pulse 97   Temp 97.9 F (36.6 C) (Oral)   Resp 18   Ht 5\' 2"  (1.575 m)   Wt 54.8 kg   SpO2 96%   BMI 22.10 kg/m  Vital signs in last 24 hours: Temp:  [97.9 F (36.6 C)] 97.9 F (36.6 C) (05/21 1322) Pulse Rate:  [72-103] 97 (05/21 1315) Resp:  [17-18] 18 (05/21 1315) BP: (134-182)/(60-115) 134/60 (05/21 1315) SpO2:  [96 %-98 %] 96 % (05/21 1315) Weight:  [54.8 kg] 54.8 kg (05/21 1230) General awake alert in no distress HEENT: Normocephalic/atraumatic Lungs: Scattered rales Cardiovascular: Irregularly irregular Extremities warm well perfused Neurological exam She is awake, alert She is nonverbal She intermittently can mimic-example closing her eyes.  Could not make a fist. Cranial nerves: Pupils equal round reactive light, left gaze preference, is able to come to midline but not look to the right consistently, does not blink to threat from the right, mild right lower facial asymmetry. Motor exam: Very minimal movement in the right upper extremity, 2/5 right lower extremity.  Left upper 5/5.  Left lower 4/5. Sensory exam: No grimace or withdrawal to noxious stimulation on the right.  To noxious stimulation on the left she is purposefully and forcefully withdrawing. Coordination difficult to assess NIHSS 1a Level of Conscious.: 0 1b LOC Questions: 2 1c LOC Commands: 1 2 Best Gaze: 2 3 Visual: 2 4 Facial Palsy: 1 5a Motor Arm - left: 0 5b Motor Arm - Right: 3 6a Motor Leg - Left: 0 6b Motor Leg - Right: 2 7 Limb Ataxia: 0 8 Sensory: 2 9 Best Language: 3 10 Dysarthria: 2 11 Extinct. and Inatten.1  TOTAL: 20  Labs I have reviewed labs in epic and the results pertinent to this consultation are:   CBC    Component Value Date/Time   WBC 6.0 07/11/2020 1204   RBC 4.70 07/11/2020 1204   HGB 14.3 07/11/2020 1214   HCT 42.0 07/11/2020 1214   HCT 32.0 (L) 01/18/2019 1222   PLT 223 07/11/2020  1204   MCV 94.0 07/11/2020 1204   MCH 29.1 07/11/2020 1204   MCHC 31.0 07/11/2020 1204   RDW 14.4 07/11/2020 1204   LYMPHSABS 1.6 07/11/2020 1204   MONOABS 0.5 07/11/2020 1204   EOSABS 0.2 07/11/2020 1204   BASOSABS 0.0 07/11/2020 1204    CMP     Component Value Date/Time   NA 140 07/11/2020 1214   K 4.1 07/11/2020 1214   CL 105 07/11/2020 1214   CO2 26 07/11/2020 1204   GLUCOSE 96 07/11/2020 1214   BUN 31 (H) 07/11/2020 1214   CREATININE 0.80 07/11/2020 1214   CALCIUM 9.5 07/11/2020 1204   PROT 7.0 07/11/2020 1204   ALBUMIN 3.2 (L) 07/11/2020 1204   AST 15 07/11/2020 1204   ALT 9 07/11/2020 1204   ALKPHOS 106 07/11/2020 1204   BILITOT 0.9 07/11/2020  [place] (L) 07/11/2020 1204   GFRAA >60 04/25/2019 0541    Lipid Panel     Component Value Date/Time   CHOL 74 04/24/2019 0112   TRIG 48 04/24/2019 0112   HDL 38 (L) 04/24/2019 0112   CHOLHDL 1.9 04/24/2019 0112   VLDL 10 04/24/2019 0112   LDLCALC 26 04/24/2019 0112     Imaging I have reviewed the images obtained:  CT-scan of the brain- no acute changes.  Aspects 10 CT angio head and neck with occlusion of the left P2/P3 segment, moderate to severe stenosis of left M3, advanced atherosclerotic disease at the arch proximal left subclavian artery  Assessment:  87 year old past history of atrial fibrillation on anticoagulation, CHF, coronary artery disease, hypertension was on hospice then taken off of hospice presenting with last known normal of 8:30 AM with right-sided weakness, leftward gaze and inability to walk and talk. NIH stroke scale 20 Aspects 10 on the CT head.  No bleed tPA discussion held with family over the phone-explained increased risk due to advanced age --in terms of tPA related hemorrhage.  Family agreed and consented to proceed with tPA administration. Not a candidate for endovascular due to poor baseline modified Rankin   Assessment: Acute Ischemic Stroke Cerebral infarction due to  embolism of left posterior cerebral artery  Cerebral atherosclerosis  Acuity: Acute Current Suspected Etiology: Atheroembolic versus cardioembolic Continue Evaluation:  -Admit to: Neurological ICU -Hold Aspirin until 24 hour post tPA neuroimaging is stable and without evidence of bleeding -Blood pressure control, goal of SYS <180 -MRI/ECHO/A1C/Lipid panel. -Hyperglycemia management per SSI to maintain glucose 140-180mg /dL. -PT/OT/ST therapies and recommendations when able  CNS -Close neuro monitoring  Dysarthria Dysphagia following cerebral infarction  -NPO until cleared by speech -ST -Advance diet as tolerated -May need PEG  Hemiplegia and hemiparesis following cerebral infarction affecting right dominant side -PT/OT -PM&R consult   RESP Rales noted Chest x-ray to rule out aspiration pneumonia  CV Blood pressure goal less than 180 Use Cleviprex drip if needed Labetalol and hydralazine as needed  Heart failure, unspecified -TTE  Hyperlipidemia, unspecified  - Statin for goal LDL < 70  Chronic atrial fibrillation -Rate control  HEME No active issues -Monitor -transfuse for hgb < 7   ENDO No active issues -goal HgbA1c < 7  GI/GU AKI with GFR 52.  Has had prior labs with GFR 58.  Likely CKD 3. -Gentle hydration  Fluid/Electrolyte Disorders No active issues Check labs in the morning Replete as needed  ID Possible Aspiration PNA -CXR -NPO -Monitor   Prophylaxis DVT: SCD GI: PPI Bowel:  Docusate senna Diet: NPO until cleared by speech  Code Status: DO NOT RESUSCITATE   THE FOLLOWING WERE PRESENT ON ADMISSION: Acute ischemic stroke, hemiplegia, probable aspiration pneumonia, CHF unspecified, cardiac arrhythmia-atrial fibrillation not on anticoagulation, CKD 3, breast cancer, DNR   CRITICAL CARE ATTESTATION Performed by: Amie Portland, MD Total critical care time: 44 minutes Critical care time was exclusive of separately billable  procedures and treating other patients and/or supervising APPs/Residents/Students Critical care was necessary to treat or prevent imminent or life-threatening deterioration due to acute ischemic stroke This patient is critically ill and at significant risk for neurological worsening and/or death and care requires constant monitoring. Critical care was time spent personally by me on the following activities: development of treatment plan with patient and/or surrogate as well as nursing, discussions with consultants, evaluation of patient's response to treatment, examination of patient, obtaining history from patient or  surrogate, ordering and performing treatments and interventions, ordering and review of laboratory studies, ordering and review of radiographic studies, pulse oximetry, re-evaluation of patient's condition, participation in multidisciplinary rounds and medical decision making of high complexity in the care of this patient.

## 2020-07-11 NOTE — Progress Notes (Signed)
PHARMACIST CODE STROKE RESPONSE  Notified to mix tPA at 1220 by Dr. Rory Percy Delivered tPA to RN at 1225  tPA dose = 4.9mg  bolus over 1 minute followed by 44.4mg  for a total dose of 49.3mg  over 1 hour  Issues/delays encountered (if applicable): Simultaneous tPA administration for another pt delay in admixture start time  Bertis Ruddy 07/11/20 12:49 PM

## 2020-07-12 ENCOUNTER — Inpatient Hospital Stay (HOSPITAL_COMMUNITY): Payer: Medicare Other

## 2020-07-12 DIAGNOSIS — L8990 Pressure ulcer of unspecified site, unspecified stage: Secondary | ICD-10-CM

## 2020-07-12 DIAGNOSIS — I6389 Other cerebral infarction: Secondary | ICD-10-CM

## 2020-07-12 DIAGNOSIS — I639 Cerebral infarction, unspecified: Secondary | ICD-10-CM | POA: Diagnosis not present

## 2020-07-12 LAB — ECHOCARDIOGRAM COMPLETE
Calc EF: 38.3 %
Height: 62 in
MV M vel: 5.81 m/s
MV Peak grad: 135 mmHg
Radius: 0.4 cm
S' Lateral: 3.1 cm
Single Plane A2C EF: 36.3 %
Single Plane A4C EF: 35.8 %
Weight: 1932.99 oz

## 2020-07-12 LAB — LIPID PANEL
Cholesterol: 128 mg/dL (ref 0–200)
HDL: 42 mg/dL (ref >40)
LDL Cholesterol: 74 mg/dL (ref 0–99)
Total CHOL/HDL Ratio: 3 RATIO
Triglycerides: 58 mg/dL (ref <150)
VLDL: 12 mg/dL (ref 0–40)

## 2020-07-12 LAB — HEMOGLOBIN A1C
Hgb A1c MFr Bld: 5.3 % (ref 4.8–5.6)
Mean Plasma Glucose: 105.41 mg/dL

## 2020-07-12 IMAGING — MR MR HEAD W/O CM
7 of 11 series · 25 of 48 positions shown · non-contrast
Comparison: CT angio head and neck [DATE]

CLINICAL DATA: Stroke follow-up. Speech abnormality. Right-sided
weakness.

EXAM:
MRI HEAD WITHOUT CONTRAST
TECHNIQUE: Multiplanar, multiecho pulse sequences of the brain and surrounding
structures were obtained without intravenous contrast.

[Series 2: DWI · axial · 3.0mm · 0.94mm/px · z∈[-45,+98]mm · 7 of 97 slices shown (1 of 2)]
[im 1/97]
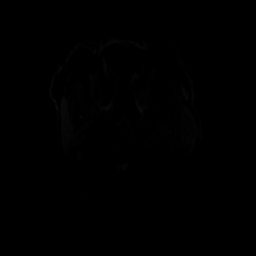
[im 17/97]
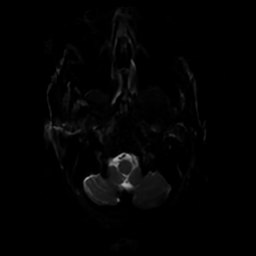
[im 33/97]
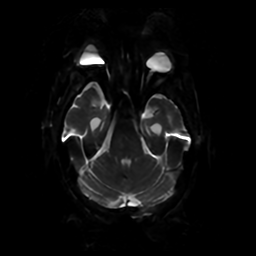
[im 49/97]
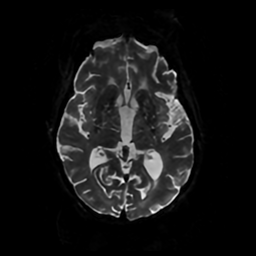
[im 65/97]
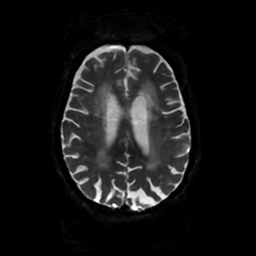
[im 81/97]
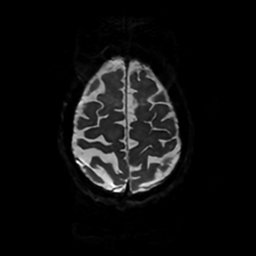
[im 97/97]
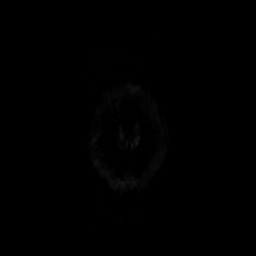

[Series 3: FLAIR · sagittal · 5.0mm · 0.23mm/px · 2 of 23 slices shown (1 of 2)]
[im 1/23]
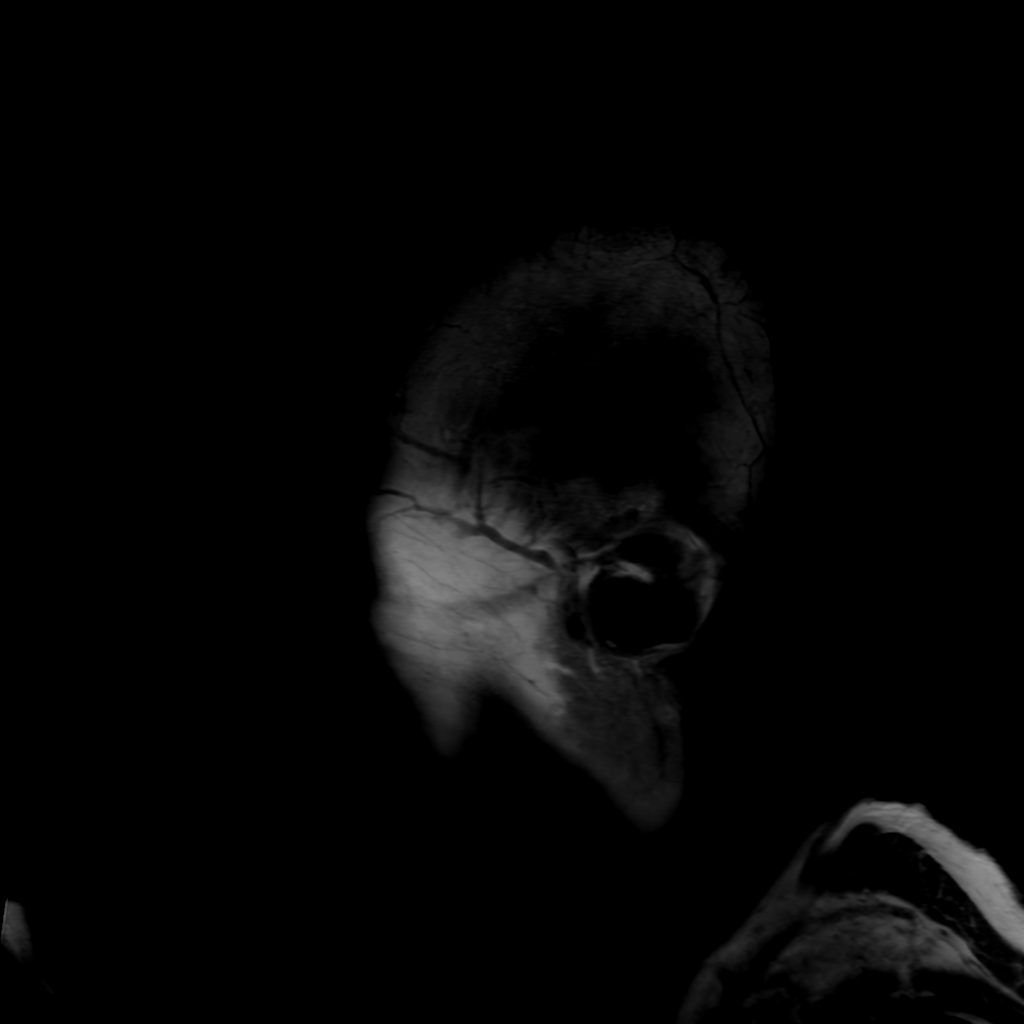
[im 23/23]
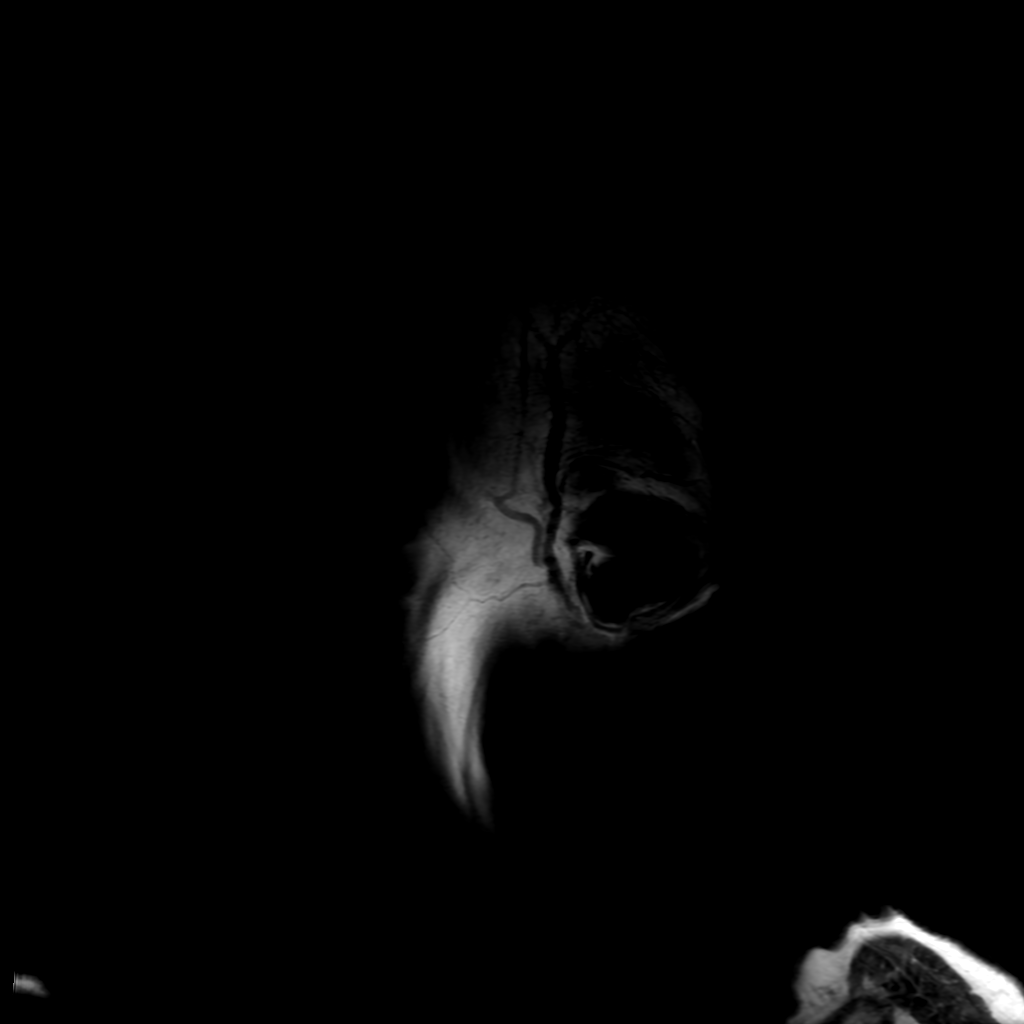

[Series 4: DWI · coronal · 4.0mm · 0.94mm/px · 6 of 74 slices shown (2 of 2)]
[im 1/74]
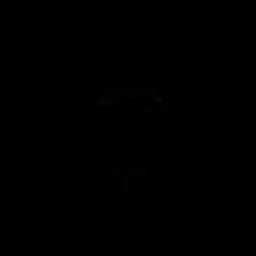
[im 15/74]
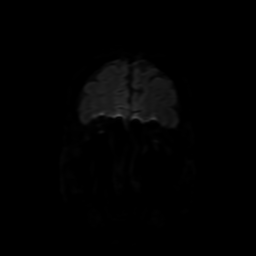
[im 30/74]
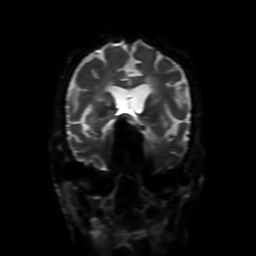
[im 44/74]
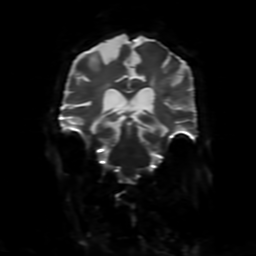
[im 59/74]
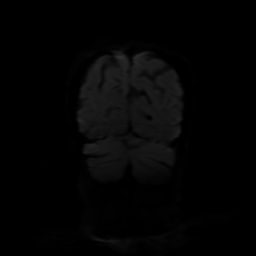
[im 74/74]
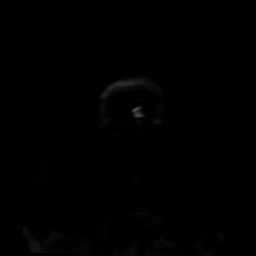

[Series 5: T2 · axial · 5.0mm · 0.23mm/px · 1 of 26 slices shown]
[im 1/26]
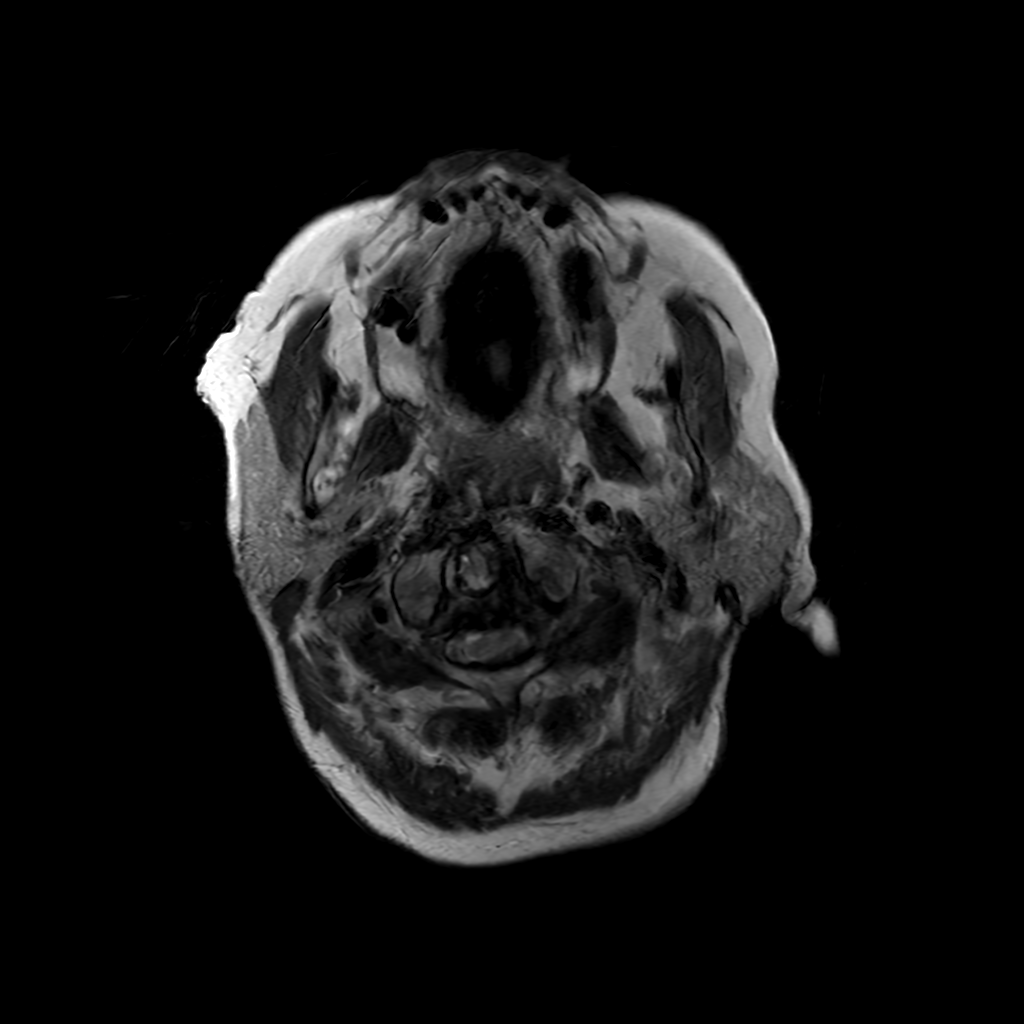

[Series 6: FLAIR · axial · 3.0mm · 0.45mm/px · z∈[-43,+103]mm · 2 of 26 slices shown (2 of 2)]
[im 1/26]
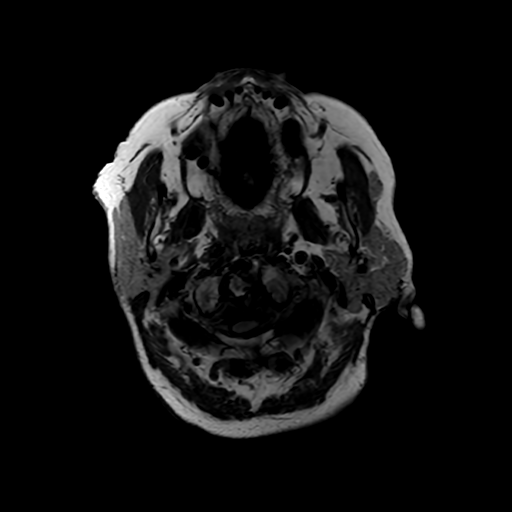
[im 26/26]
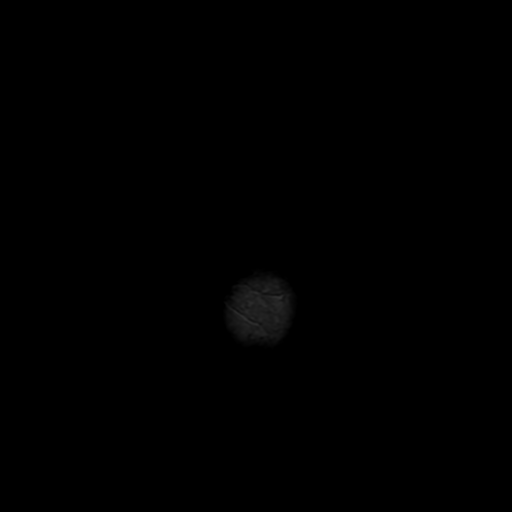

[Series 250: ADC · axial · 3.0mm · 0.94mm/px · z∈[-45,+98]mm · 4 of 50 slices shown (1 of 2)]
[im 1/50]
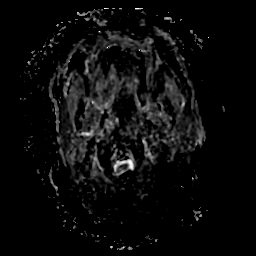
[im 17/50]
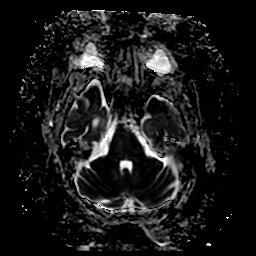
[im 33/50]
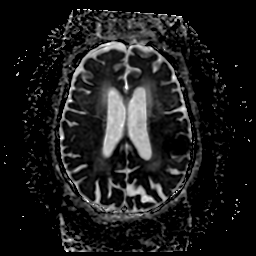
[im 50/50]
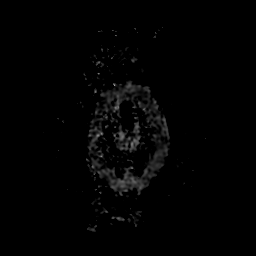

[Series 450: ADC · coronal · 4.0mm · 0.94mm/px · 3 of 37 slices shown (2 of 2)]
[im 1/37]
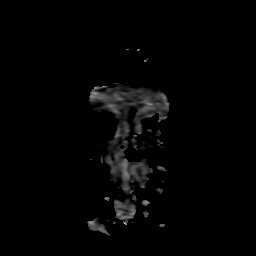
[im 19/37]
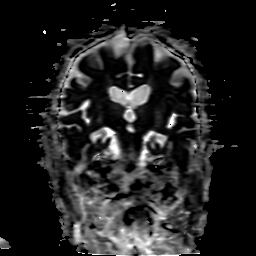
[im 37/37]
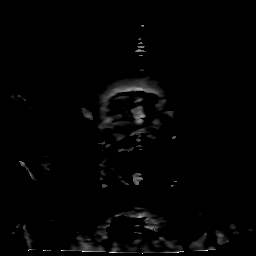

[25 of 48 positions shown; findings below may reference images not displayed]

FINDINGS: Brain: Acute infarct in the left posterior insula and parietal
operculum. There is associated T2 and FLAIR edema as well as
restricted diffusion. No associated hemorrhage in the infarct.

Generalized atrophy. Moderate chronic microvascular ischemic change
in the white matter. Mild hyperintensity in the pons. No hemorrhage
or mass lesion identified.

Vascular: Normal arterial flow voids.

Skull and upper cervical spine: C1-2 arthropathy with large amount
of pannus posterior to the dens. There is mass-effect on the
proximal spinal cord. Moderate spinal stenosis.

Sinuses/Orbits: Mild mucosal edema paranasal sinuses. Bilateral
cataract extraction

Other: None
IMPRESSION: Acute left MCA infarct involving the posterior insula and parietal
operculum. No associated hemorrhage

Moderate chronic microvascular ischemic changes.

C1-2 arthropathy with large amount of pannus causing moderate spinal
stenosis and posterior displacement of the proximal spinal cord.
This may be due to CPPD or rheumatoid arthritis.

## 2020-07-12 MED ORDER — HYDRALAZINE HCL 20 MG/ML IJ SOLN
10.0000 mg | Freq: Four times a day (QID) | INTRAMUSCULAR | Status: DC | PRN
  Administered 2020-07-15: 10 mg via INTRAVENOUS
  Filled 2020-07-12: qty 1

## 2020-07-12 MED ORDER — METOPROLOL TARTRATE 5 MG/5ML IV SOLN
2.5000 mg | Freq: Once | INTRAVENOUS | Status: AC
  Administered 2020-07-12: 2.5 mg via INTRAVENOUS
  Filled 2020-07-12: qty 5

## 2020-07-12 MED ORDER — DILTIAZEM HCL ER COATED BEADS 120 MG PO CP24
120.0000 mg | ORAL_CAPSULE | Freq: Every day | ORAL | Status: DC
  Administered 2020-07-12 – 2020-07-16 (×5): 120 mg via ORAL
  Filled 2020-07-12 (×5): qty 1

## 2020-07-12 NOTE — Progress Notes (Signed)
PT Cancellation Note  Patient Details Name: Sheryl Suarez MRN: [id number] DOB: January 02, 1924   Cancelled Treatment:    Reason Eval/Treat Not Completed: Active bedrest order. Coordinated with RN and MD to notify PT if bedrest orders are discharged and pt becomes appropriate for PT eval later this date. Will follow-up as time permits and as appropriate.   Moishe Spice, PT, DPT Acute Rehabilitation Services  Pager: [phone] Office: [place] 07/12/2020, 7:35 AM

## 2020-07-12 NOTE — Evaluation (Signed)
Clinical/Bedside Swallow Evaluation Patient Details  Name: Sheryl Suarez MRN: [id number] Date of Birth: 1923-04-26  Today's Date: 07/12/2020 Time: SLP Start Time (ACUTE ONLY): 0930 SLP Stop Time (ACUTE ONLY): 0955 SLP Time Calculation (min) (ACUTE ONLY): 25 min  Past Medical History:  Past Medical History:  Diagnosis Date  . Arrhythmia    atrial fibrillation  . Breast cancer ([place])    remission  . CHF (congestive heart failure) ([place])   . Coronary artery disease   . Hypertension   . Stroke [facility])    Past Surgical History:  Past Surgical History:  Procedure Laterality Date  . ABDOMINAL HYSTERECTOMY    . APPENDECTOMY    . BREAST IMPLANT EXCHANGE    . CHOLECYSTECTOMY    . ESOPHAGOGASTRODUODENOSCOPY (EGD) WITH PROPOFOL N/A 08/20/2015   Procedure: ESOPHAGOGASTRODUODENOSCOPY (EGD) WITH PROPOFOL;  Surgeon: Lollie Sails, MD;  Location: [facility] ENDOSCOPY;  Service: Endoscopy;  Laterality: N/A;  . MASTECTOMY Bilateral   . ORIF ELBOW FRACTURE Right 01/16/2019   Procedure: OPEN REDUCTION INTERNAL FIXATION (ORIF) ELBOW/OLECRANON FRACTURE;  Surgeon: Earnestine Leys, MD;  Location: [facility] ORS;  Service: Orthopedics;  Laterality: Right;   HPI:  Patient is a 87 y.o. female with PMH: atrial fibrillation not on anticoagulation, CHF, CAD, HTN, h/o breast cancer now in remission, was on hospice at some point but taken off hospice due to improved from mediation adjustment. She was brought to the ER from SNF for evaluation of new onset right-sided weakness and inability to talk. At baseline, reportedly patient is able to get around using walker and has conversations fairly normally per SNF staff report. CT head was negative for CVA, MRI brain has been ordered but not completed at this time.   Assessment / Plan / Recommendation Clinical Impression  Patient presents with a mild oropharyngeal dysphagia. Oral phase was marked by delays in mastication and full clearance of oral cavity post initial swallows and  very mild amount of anterior leakage of liquids on right side corner of mouth. Patient exhibited swift, timely swallows with thin liquids (water, straw and cup sips). Intermittent, delayed throat clearing was observed with instances of mild cough response and this was mostly seen towards end of PO intake when patient was eating Cheerios cereal with milk. Delayed throat clearing could be indicative of reflux/GERD versus pharyngeal phase impairment of swallow. SLP recommending to initiate Dys 3 solids, thin liquids and will f/u for toleration and determination of need for objecitve swallow study. SLP Visit Diagnosis: Dysphagia, unspecified (R13.10)    Aspiration Risk  Mild aspiration risk    Diet Recommendation Dysphagia 3 (Mech soft);Thin liquid   Liquid Administration via: Cup;Straw Medication Administration: Whole meds with puree Supervision: Full supervision/cueing for compensatory strategies;Staff to assist with self feeding;Comment (patient has bilateral tremors/shaking with both arms which prevents her from feeding herself effectively) Compensations: Slow rate;Small sips/bites Postural Changes: Seated upright at 90 degrees    Other  Recommendations Oral Care Recommendations: Oral care BID;Staff/trained caregiver to provide oral care   Follow up Recommendations 24 hour supervision/assistance;Skilled Nursing facility      Frequency and Duration min 2x/week  1 week       Prognosis Prognosis for Safe Diet Advancement: Good      Swallow Study   General Date of Onset: 07/11/20 HPI: Patient is a 87 y.o. female with PMH: atrial fibrillation not on anticoagulation, CHF, CAD, HTN, h/o breast cancer now in remission, was on hospice at some point but taken off hospice due to improved from  mediation adjustment. She was brought to the ER from SNF for evaluation of new onset right-sided weakness and inability to talk. At baseline, reportedly patient is able to get around using walker and has  conversations fairly normally per SNF staff report. CT head was negative for CVA, MRI brain has been ordered but not completed at this time. Type of Study: Bedside Swallow Evaluation Previous Swallow Assessment: None found Diet Prior to this Study: NPO Temperature Spikes Noted: No Respiratory Status: Room air History of Recent Intubation: No Behavior/Cognition: Alert;Cooperative;Pleasant mood Oral Cavity Assessment: Dry Oral Care Completed by SLP: Yes Oral Cavity - Dentition: Adequate natural dentition;Other (Comment) (dentition with partials in place adequate for mastication) Vision: Functional for self-feeding Self-Feeding Abilities: Needs assist;Needs set up Patient Positioning: Upright in bed Baseline Vocal Quality: Normal Volitional Cough: Weak Volitional Swallow: Unable to elicit    Oral/Motor/Sensory Function Overall Oral Motor/Sensory Function: Mild impairment Facial ROM: Reduced right Facial Symmetry: Abnormal symmetry right Facial Strength: Reduced right Lingual ROM: Within Functional Limits Lingual Symmetry: Within Functional Limits Lingual Strength: Reduced   Ice Chips     Thin Liquid Thin Liquid: Impaired Presentation: Cup;Straw Oral Phase Functional Implications: Right anterior spillage Pharyngeal  Phase Impairments: Throat Clearing - Delayed;Cough - Delayed Other Comments: intermittent instances of delayed throat clearing and mild cough which primarily occured with mixed consistencies    Nectar Thick     Honey Thick     Puree Puree: Within functional limits Presentation: Spoon   Solid     Solid: Impaired Oral Phase Impairments: Impaired mastication Oral Phase Functional Implications: Oral residue      Sonia Baller, MA, CCC-SLP Speech Therapy [facility] Acute Rehab

## 2020-07-12 NOTE — Evaluation (Signed)
Physical Therapy Evaluation Patient Details Name: Sheryl Suarez MRN: [id number] DOB: 1923/05/08 Today's Date: 07/12/2020   History of Present Illness  Pt is a 87 y.o. female who presented 5/21 with R-sided weakness and inability to talk. tPA was administered. CT angio head and neck with occlusion of the left P2/P3 segment, moderate to severe stenosis of left M3, advanced atherosclerotic disease at the arch proximal left subclavian artery. MRI of head revealed acute L MCA infarct involving the posterior insula and parietal operculum. PMH: atrial fibrillation not on anticoagulation, CHF, CAD, HTN, and history of breast cancer now in remission.    Clinical Impression  Pt presents with condition above and deficits mentioned below, see PT Problem List. PTA, she was living at [facility] in [place], needing assistance for pericare and dressing and bathing her lower half. Also, she needed supervision for hallway distance mobility utilizing a RW. She also has a L UE tremor at baseline. Currently, pt is demonstrating expressive aphasia, apraxia, incoordination on her R side, weakness on her R side, balance deficits, and decreased activity tolerance. Pt is requiring min guard-minA for all functional mobility with a RW at this time. If pt's ALF can provide the level of care needed she could benefit from return to her ALF with HHPT as she does have some memory deficits at baseline. If this is not possible she could benefit from therapy at a SNF prior to return to her ALF. Will continue to follow acutely.    Follow Up Recommendations Home health PT;SNF;Supervision/Assistance - 24 hour (depending on level of care ALF can provide)    Equipment Recommendations  None recommended by PT (has necessary equipment)    Recommendations for Other Services       Precautions / Restrictions Precautions Precautions: Fall Restrictions Weight Bearing Restrictions: No      Mobility  Bed Mobility Overal bed  mobility: Needs Assistance Bed Mobility: Supine to Sit     Supine to sit: HOB elevated;Min assist     General bed mobility comments: Pt able to use bed rails and controls to manage legs off bed and ascend trunk with increased time, but needing minA to scoot to EOB and square hips with EOB.    Transfers Overall transfer level: Needs assistance Equipment used: Rolling walker (2 wheeled) Transfers: Sit to/from Stand Sit to Stand: Min assist         General transfer comment: MinA for steadying, with pt pulling up on RW coming to stand and keeping hands on RW when sitting down.  Ambulation/Gait Ambulation/Gait assistance: Min guard;Min assist Gait Distance (Feet): 60 Feet Assistive device: Rolling walker (2 wheeled) Gait Pattern/deviations: Step-through pattern;Decreased step length - right;Decreased stride length;Shuffle;Trunk flexed Gait velocity: reduced Gait velocity interpretation: <1.8 ft/sec, indicate of risk for recurrent falls General Gait Details: Pt with short, shuffling steps and flexed posture, keeping RW distal to her body. Cues provided to improve RW management with poor carryover, min guard-minA for steadying, managing RW, and safety. No overt LOB.  Stairs            Wheelchair Mobility    Modified Rankin (Stroke Patients Only) Modified Rankin (Stroke Patients Only) Pre-Morbid Rankin Score: Moderately severe disability Modified Rankin: Moderately severe disability     Balance Overall balance assessment: Needs assistance Sitting-balance support: No upper extremity supported;Feet supported Sitting balance-Leahy Scale: Fair Sitting balance - Comments: No UE support with poor ability to reach off BOS, min guard-supervision for safety.   Standing balance support: Bilateral upper extremity  supported;During functional activity Standing balance-Leahy Scale: Poor Standing balance comment: Reliant on bil UE support.                              Pertinent Vitals/Pain Pain Assessment: Faces Faces Pain Scale: No hurt Pain Intervention(s): Monitored during session    Home Living Family/patient expects to be discharged to:: Assisted living ([facility] in [place])                 Additional Comments: Owns a bedside commode, transport chair, and RW.    Prior Function Level of Independence: Needs assistance   Gait / Transfers Assistance Needed: Pt needing supervision for hallway distance mobility with a RW. Pt able to get into/out of bed independently.  ADL's / Homemaking Assistance Needed: Pt needed assistance with pericare and dressing/bathing her lower half. Pt was feeding herself.        Hand Dominance   Dominant Hand: Right    Extremity/Trunk Assessment   Upper Extremity Assessment Upper Extremity Assessment: RUE deficits/detail RUE Deficits / Details: Apraxia noted with decreased strength    Lower Extremity Assessment Lower Extremity Assessment: RLE deficits/detail RLE Deficits / Details: Decreased strength compared to L with L MMT grossly 4 to 4+ and R MMT grossly 4- to 4; decreased coordination RLE Coordination: decreased fine motor;decreased gross motor    Cervical / Trunk Assessment Cervical / Trunk Assessment: Kyphotic  Communication   Communication: HOH;Expressive difficulties  Cognition Arousal/Alertness: Awake/alert Behavior During Therapy: WFL for tasks assessed/performed Overall Cognitive Status: History of cognitive impairments - at baseline                                 General Comments: memory issues at baseline. Pt demonstrates difficulty sequencing tasks and apraxia. Pt gets frustrated with inability to express herself verbally, but does joke and laugh on occasion also.      General Comments General comments (skin integrity, edema, etc.): HR jumped up to 140s on occasion with straining, needing cues to stop and rest to allow it to decrease to 110-120s; HR up to  159 at one point during gait, thus returned pt to sitting and ended session, pt denied any signs or symptoms; SpO2 decreased to mid-80s% on RA towards end of gait with noted wheezing by pt, took a couple minutes sitting to rest and rebound to >/= 92%    Exercises     Assessment/Plan    PT Assessment Patient needs continued PT services  PT Problem List Decreased strength;Decreased range of motion;Decreased activity tolerance;Decreased balance;Decreased coordination;Decreased mobility;Decreased cognition;Decreased knowledge of use of DME;Decreased safety awareness       PT Treatment Interventions DME instruction;Gait training;Functional mobility training;Therapeutic activities;Therapeutic exercise;Balance training;Neuromuscular re-education;Patient/family education;Cognitive remediation    PT Goals (Current goals can be found in the Care Plan section)  Acute Rehab PT Goals Patient Stated Goal: to be able to speak better PT Goal Formulation: With patient/family Time For Goal Achievement: 07/26/20 Potential to Achieve Goals: Good    Frequency Min 3X/week   Barriers to discharge        Co-evaluation               AM-PAC PT "6 Clicks" Mobility  Outcome Measure Help needed turning from your back to your side while in a flat bed without using bedrails?: A Little Help needed moving from lying on your back to sitting  on the side of a flat bed without using bedrails?: A Little Help needed moving to and from a bed to a chair (including a wheelchair)?: A Little Help needed standing up from a chair using your arms (e.g., wheelchair or bedside chair)?: A Little Help needed to walk in hospital room?: A Little Help needed climbing 3-5 steps with a railing? : A Lot 6 Click Score: 17    End of Session Equipment Utilized During Treatment: Gait belt Activity Tolerance: Patient limited by fatigue Patient left: in chair;with call bell/phone within reach;with chair alarm set Nurse  Communication: Mobility status;Other (comment) (vitals) PT Visit Diagnosis: Unsteadiness on feet (R26.81);Other abnormalities of gait and mobility (R26.89);Muscle weakness (generalized) (M62.81);Difficulty in walking, not elsewhere classified (R26.2);Apraxia (R48.2);Other symptoms and signs involving the nervous system (R29.898);Hemiplegia and hemiparesis Hemiplegia - Right/Left: Right Hemiplegia - dominant/non-dominant: Dominant Hemiplegia - caused by: Cerebral infarction    Time: 7654-6503 PT Time Calculation (min) (ACUTE ONLY): 40 min   Charges:   PT Evaluation $PT Eval Moderate Complexity: 1 Mod PT Treatments $Gait Training: 8-22 mins $Therapeutic Activity: 8-22 mins        Moishe Spice, PT, DPT Acute Rehabilitation Services  Pager: [phone] Office: [phone]   Orvan Falconer 07/12/2020, 2:07 PM

## 2020-07-12 NOTE — Progress Notes (Signed)
  Echocardiogram 2D Echocardiogram has been performed.  Sheryl Suarez 07/12/2020, 11:43 AM

## 2020-07-12 NOTE — Progress Notes (Signed)
STROKE TEAM PROGRESS NOTE   INTERVAL HISTORY  She is feeling better.  Remains with speech difficulty. Does not know why she is here. Following commands briskly.  Mild dysarthria and right sided weakness persists but improved.  Vitals:   07/12/20 0500 07/12/20 0600 07/12/20 0700 07/12/20 0800  BP: (!) 144/88 (!) 172/75 (!) 160/92 (!) 163/76  Pulse: (!) 110 94 83 98  Resp: 20 17 15 17   Temp:    98.5 F (36.9 C)  TempSrc:    Oral  SpO2: 96% 91% 90% 97%  Weight:      Height:       CBC:  Recent Labs  Lab 07/11/20 1204 07/11/20 1214  WBC 6.0  --   NEUTROABS 3.7  --   HGB 13.7 14.3  HCT 44.2 42.0  MCV 94.0  --   PLT 223  --    Basic Metabolic Panel:  Recent Labs  Lab 07/11/20 1204 07/11/20 1214  NA 139 140  K 3.9 4.1  CL 105 105  CO2 26  --   GLUCOSE 98 96  BUN 24* 31*  CREATININE 1.00 0.80  CALCIUM 9.5  --    Lipid Panel:  Recent Labs  Lab 07/12/20 0255  CHOL 128  TRIG 58  HDL 42  CHOLHDL 3.0  VLDL 12  LDLCALC 74   HgbA1c:  Recent Labs  Lab 07/12/20 0255  HGBA1C 5.3   Urine Drug Screen: No results for input(s): LABOPIA, COCAINSCRNUR, LABBENZ, AMPHETMU, THCU, LABBARB in the last 168 hours.  Alcohol Level No results for input(s): ETH in the last 168 hours.  IMAGING past 24 hours  1. Occlusion left P2/P3 segment likely acute 2. Moderate to severe stenosis left M3 segment supplying the left frontal parietal lobe. 3. Advanced atherosclerotic disease aortic arch and proximal left subclavian artery 4. Mild atherosclerotic disease in the carotid bifurcation bilaterally. Moderate stenosis in the cavernous carotid bilaterally due to atherosclerotic disease. 5. Moderate to severe stenosis distal left vertebral artery. 6. These results were called by telephone at the time of interpretation on 07/11/2020 at 12:49 pm to provider [facility] , who verbally acknowledged these results  PHYSICAL EXAM Pleasant elderly Caucasian lady not in  distress.  ASSESSMENT/PLAN  Sheryl Suarez is a 87 y.o. female past medical of atrial fibrillation not on anticoagulation, CHF, coronary artery disease, hypertension, history of breast cancer now in remission, at some point on hospice but taken off of hospice because of improvement after medication adjustment, brought into the emergency room for evaluation of sudden onset of right-sided weakness and inability to talk. tPA was administered.   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 cerebral atherosclerosis   CT head no acute abnormality   CTA head & neck: Occlusion left P2/P3 segment likely acute,  Moderate to severe stenosis left M3 segment supplying the left frontal parietal lobe, Advanced atherosclerotic disease aortic arch and proximal left subclavian artery, Mild atherosclerotic disease in the carotid bifurcation bilaterally. Moderate stenosis in the cavernous carotid bilaterally due to atherosclerotic disease, Moderate to severe stenosis distal left vertebral artery.   CT perfusion  MRI pending  2D Echo pending  LDL 74  HgbA1c 5.3  VTE prophylaxis -     Diet   Diet NPO time specified     Therapy recommendations:    Disposition:    Chronic atrial fibrillation  Rate control  Hypertension  Initially required cleviprex infusion   Home cardizem  . Keep systolic BP less than  180 . Long-term BP goal normotensive  Hyperlipidemia  LDL 74, not quite at goal < 70  High intensity statin  Continue statin at discharge  Other Stroke Risk Factors  Advanced Age >/= 57    Coronary artery disease  Other Active Problems    Hospital day # 1 I have personally obtained history,examined this patient, reviewed notes, independently viewed imaging studies, participated in medical decision making and plan of care.ROS completed by me personally and pertinent positives fully documented  I have made any additions or clarifications directly to the  above note. Agree with note above.  She presented sudden onset of aphasia and right hemiplegia due to left hemispheric embolic infarct on atrial fibrillation and is not on long-term anticoagulation due to history of frequent falls.  She received IV tPA and has shown improvement in her speech and strength.  Continue close neurological monitoring and strict blood pressure control as per post tPA protocol.  Mobilize out of bed.  Therapy consults.  Check MRI scan of the brain later today.  Long discussion patient and son at the bedside and answered questions.  Call as needed no bleeding follow-up imaging.  She is possibly not a good long-term anticoagulation candidate given fall risk and suspect mild underlying dementia. This patient is critically ill and at significant risk of neurological worsening, death and care requires constant monitoring of vital signs, hemodynamics,respiratory and cardiac monitoring, extensive review of multiple databases, frequent neurological assessment, discussion with family, other specialists and medical decision making of high complexity.I have made any additions or clarifications directly to the above note.This critical care time does not reflect procedure time, or teaching time or supervisory time of PA/NP/Med Resident etc but could involve care discussion time.  I spent 30 minutes of neurocritical care time  in the care of  this patient.     Antony Contras, MD Medical Director [facility] Pager: [phone] 07/12/2020 3:00 PM   To contact Stroke Continuity provider, please refer to [email or web address] After hours, contact General Neurology

## 2020-07-12 NOTE — Progress Notes (Signed)
1400:  Stroke MD notified that patient's heart rate is more elevated, appears to be a fib RVR.  Patient is known to have a fib. Verbal order received.

## 2020-07-13 ENCOUNTER — Inpatient Hospital Stay (HOSPITAL_COMMUNITY): Payer: Medicare Other

## 2020-07-13 DIAGNOSIS — I639 Cerebral infarction, unspecified: Secondary | ICD-10-CM | POA: Diagnosis not present

## 2020-07-13 MED ORDER — ATORVASTATIN CALCIUM 10 MG PO TABS
10.0000 mg | ORAL_TABLET | Freq: Every day | ORAL | Status: DC
  Administered 2020-07-13 – 2020-07-16 (×4): 10 mg via ORAL
  Filled 2020-07-13 (×4): qty 1

## 2020-07-13 MED ORDER — ASPIRIN EC 81 MG PO TBEC
81.0000 mg | DELAYED_RELEASE_TABLET | Freq: Every day | ORAL | Status: DC
  Administered 2020-07-13 – 2020-07-16 (×4): 81 mg via ORAL
  Filled 2020-07-13 (×4): qty 1

## 2020-07-13 NOTE — Progress Notes (Signed)
Physical Therapy Treatment Patient Details Name: Sheryl Suarez MRN: [id number] DOB: [birth year] Today's Date: 07/13/2020    History of Present Illness 87 y.o. female who presented 5/21 with R-sided weakness and inability to talk. tPA was administered. MRI of head revealed acute L MCA infarct involving the posterior insula and parietal operculum. PMH: atrial fibrillation not on anticoagulation, CHF, CAD, HTN, and history of breast cancer now in remission.    PT Comments    The pt was seen for continued progression of OOB mobility, strengthening, and balance training this session. She was able to demo good progress with OOB transfers and standing stability, completing x10 from recliner with no use of AD. The pt was then challenged by hallway ambulation with single UE support, but no use of AD. The pt presents with slowed mobility, shorter strides, and increased lateral mobility which indicate increased risk of falls without UE support. The pt reports fatigue following ~25 ft hallway ambulation, and required return to seated position for rest. The pt will continue to benefit from skilled PT acutely and following d/c. Due to continued deficits with safety awareness, awareness of deficits, and ability to problem solve for current balance deficits, the pt remains a high fall risk and will need supervision with all mobility at d/c. If this is unavailable at her facility, will need short stint SNF for continued rehab.    Follow Up Recommendations  Home health PT;SNF;Supervision/Assistance - 24 hour (depending on level of assist ALF can provide)     Equipment Recommendations  None recommended by PT (has equipment)    Recommendations for Other Services       Precautions / Restrictions Precautions Precautions: Fall Restrictions Weight Bearing Restrictions: No    Mobility  Bed Mobility Overal bed mobility: Needs Assistance             General bed mobility comments: pt OOB in recliner at  start and end of session    Transfers Overall transfer level: Needs assistance Equipment used: Rolling walker (2 wheeled);1 person hand held assist Transfers: Sit to/from Stand Sit to Stand: Min assist;Min guard         General transfer comment: minA to steady, pt with posterior lean either against recliner with legs or into minA from PT with each stand despite cues for increased forward/upright position. x10 from recliner with single UE support or minA with no UE support  Ambulation/Gait Ambulation/Gait assistance: Min assist Gait Distance (Feet): 45 Feet Assistive device: 1 person hand held assist Gait Pattern/deviations: Step-to pattern;Step-through pattern;Decreased stride length;Shuffle;Narrow base of support Gait velocity: decreased Gait velocity interpretation: <1.31 ft/sec, indicative of household ambulator General Gait Details: short, shuffling steps with minimal clearance and increased lateral movement. challenged by backwards stepping, unable to achieve  hip extension for step through with backwards gait. increased assist with fatigue. HR max 154bpm      Modified Rankin (Stroke Patients Only) Modified Rankin (Stroke Patients Only) Pre-Morbid Rankin Score: Moderately severe disability Modified Rankin: Moderately severe disability     Balance Overall balance assessment: Needs assistance Sitting-balance support: No upper extremity supported;Feet supported Sitting balance-Leahy Scale: Fair Sitting balance - Comments: No UE support with poor ability to reach off BOS, min guard-supervision for safety. Postural control: Posterior lean Standing balance support: Single extremity supported;During functional activity Standing balance-Leahy Scale: Poor Standing balance comment: able to complete dynamic reaching in standing, but requires minA and single UE support. increased effort             High  level balance activites: Backward walking;Sudden stops High Level Balance  Comments: pt with increased challenge with backwards walking, as described in gait. min-modA to steady with single UE support            Cognition Arousal/Alertness: Awake/alert Behavior During Therapy: WFL for tasks assessed/performed Overall Cognitive Status: History of cognitive impairments - at baseline                                 General Comments: memory issues at baseline. Pt demonstrates difficulty sequencing tasks and apraxia. Pt gets frustrated with inability to express herself verbally, but does joke and laugh on occasion also.      Exercises Other Exercises Other Exercises: sit-stand from recliner. x10 with x4 of mini-squat as part of lower. minG to minA    General Comments General comments (skin integrity, edema, etc.): HR to 154 max, steady      Pertinent Vitals/Pain Pain Assessment: Faces Pain Score: 0-No pain Faces Pain Scale: No hurt Pain Intervention(s): Monitored during session    Home Living Family/patient expects to be discharged to:: Private residence Living Arrangements: Alone Available Help at Discharge: Family;Available PRN/intermittently           Additional Comments: Pt reports she lives alone in "my house". Reports her daughter comes to help. THis is inconsistent with information collected by PT at eval yesterday. Unsure of reliability    Prior Function Level of Independence: Needs assistance          PT Goals (current goals can now be found in the care plan section) Acute Rehab PT Goals Patient Stated Goal: to be able to speak better PT Goal Formulation: With patient/family Time For Goal Achievement: 07/26/20 Potential to Achieve Goals: Good Progress towards PT goals: Progressing toward goals    Frequency    Min 3X/week      PT Plan Current plan remains appropriate    Co-evaluation              AM-PAC PT "6 Clicks" Mobility   Outcome Measure  Help needed turning from your back to your side while in  a flat bed without using bedrails?: A Little Help needed moving from lying on your back to sitting on the side of a flat bed without using bedrails?: A Little Help needed moving to and from a bed to a chair (including a wheelchair)?: A Little Help needed standing up from a chair using your arms (e.g., wheelchair or bedside chair)?: A Little Help needed to walk in hospital room?: A Little Help needed climbing 3-5 steps with a railing? : A Lot 6 Click Score: 17    End of Session Equipment Utilized During Treatment: Gait belt Activity Tolerance: Patient limited by fatigue Patient left: in chair;with call bell/phone within reach;with chair alarm set Nurse Communication: Mobility status PT Visit Diagnosis: Unsteadiness on feet (R26.81);Other abnormalities of gait and mobility (R26.89);Muscle weakness (generalized) (M62.81);Difficulty in walking, not elsewhere classified (R26.2);Apraxia (R48.2);Other symptoms and signs involving the nervous system (R29.898);Hemiplegia and hemiparesis Hemiplegia - Right/Left: Right Hemiplegia - dominant/non-dominant: Dominant Hemiplegia - caused by: Cerebral infarction     Time: 6301-6010 PT Time Calculation (min) (ACUTE ONLY): 24 min  Charges:  $Gait Training: 8-22 mins $Neuromuscular Re-education: 8-22 mins                     Karma Ganja, PT, DPT   Acute Rehabilitation Department Pager #: [phone]   Otho Bellows 07/13/2020, 9:12 AM

## 2020-07-13 NOTE — Progress Notes (Signed)
STROKE TEAM PROGRESS NOTE   INTERVAL HISTORY  No acute events overnight. She is doing very well with improved speech and impressive recovery for her age. SLP planning MBS.  Had not been on anticoagulation for a while due to frequent falls (about once a week). Lives in ALF in [place].  Her son is at bedside. We discussed her stroke diagnosis and plan of care including risk/benefit of anticoagulation. Recommended going back on Eliquis. They are agreeable to this plan.  We discussed therapy recommendations. Daughter was updated on plan of care via phone while on rounds. Questions asked by patient, son and daughter were answered.  Vitals:   07/13/20 0500 07/13/20 0600 07/13/20 0800 07/13/20 0900  BP: 131/63 (!) 157/71 (!) 149/91 136/70  Pulse: 72 81 81 (!) 108  Resp: 13 14 14  (!) 22  Temp:   (!) 97.5 F (36.4 C)   TempSrc:   Oral   SpO2: 94% 97% 97% 98%  Weight:      Height:       CBC:  Recent Labs  Lab 07/11/20 1204 07/11/20 1214  WBC 6.0  --   NEUTROABS 3.7  --   HGB 13.7 14.3  HCT 44.2 42.0  MCV 94.0  --   PLT 223  --    Basic Metabolic Panel:  Recent Labs  Lab 07/11/20 1204 07/11/20 1214  NA 139 140  K 3.9 4.1  CL 105 105  CO2 26  --   GLUCOSE 98 96  BUN 24* 31*  CREATININE 1.00 0.80  CALCIUM 9.5  --    Lipid Panel:  Recent Labs  Lab 07/12/20 0255  CHOL 128  TRIG 58  HDL 42  CHOLHDL 3.0  VLDL 12  LDLCALC 74   HgbA1c:  Recent Labs  Lab 07/12/20 0255  HGBA1C 5.3   Urine Drug Screen: No results for input(s): LABOPIA, COCAINSCRNUR, LABBENZ, AMPHETMU, THCU, LABBARB in the last 168 hours.  Alcohol Level No results for input(s): ETH in the last 168 hours.  IMAGING past 24 hours  1. Occlusion left P2/P3 segment likely acute 2. Moderate to severe stenosis left M3 segment supplying the left frontal parietal lobe. 3. Advanced atherosclerotic disease aortic arch and proximal left subclavian artery 4. Mild atherosclerotic disease in the carotid  bifurcation bilaterally. Moderate stenosis in the cavernous carotid bilaterally due to atherosclerotic disease. 5. Moderate to severe stenosis distal left vertebral artery. 6. These results were called by telephone at the time of interpretation on 07/11/2020 at 12:49 pm to provider ASHISH ARORA , who verbally acknowledged these results  PHYSICAL EXAM General awake alert in no distress HEENT: Normocephalic/atraumatic Lungs: Scattered rales Cardiovascular: Irregularly irregular Extremities warm well perfused Neurological exam She is awake, alert she has mild expressive aphasia with word finding difficulty and word hesitancy.  She can follow simple 1 and some two-step commands.  There is diminished attention, registration and recall.  She is able to name and repeat. Cranial nerves: Pupils equal round reactive light, left gaze preference, is able to come to midline but not look to the right consistently, does not blink to threat from the right, mild right lower facial asymmetry. Motor exam: Right upper extremity weakness but able to lift off the bed., 3/5 right lower extremity.  Left upper 5/5.  Left lower 4/5. Sensory exam: No grimace or withdrawal to noxious stimulation on the right.  To noxious stimulation on the left she is purposefully and forcefully withdrawing. Coordination difficult to assess  ASSESSMENT/PLAN  Sheryl Bowen  Suarez is a 87 y.o. female past medical of atrial fibrillation not on anticoagulation, CHF, coronary artery disease, hypertension, history of breast cancer now in remission, at some point on hospice but taken off of hospice because of improvement after medication adjustment, brought into the emergency room for evaluation of sudden onset of right-sided weakness and inability to talk. tPA was administered.   Left hemispheric embolic infarct on atrial fibrillation and is not on long-term anticoagulation due to history of frequent falls   CT head no acute abnormality   CTA  head & neck: Occlusion left P2/P3 segment likely acute,  Moderate to severe stenosis left M3 segment supplying the left frontal parietal lobe, Advanced atherosclerotic disease aortic arch and proximal left subclavian artery, Mild atherosclerotic disease in the carotid bifurcation bilaterally. Moderate stenosis in the cavernous carotid bilaterally due to atherosclerotic disease, Moderate to severe stenosis distal left vertebral artery.   MRI acute left MCA infarct involving posterior insula and parietal operculum.  No hemorrhage  2D Echo EF 50%, Left atrial size was severely dilated, Right atrial size was mildly dilated, mild pulm HTN, No shunt, thrombus or wall motion abnormality  LDL 74  HgbA1c 5.3  VTE prophylaxis - SCDs    Diet   DIET DYS 3 Room service appropriate? Yes with Assist; Fluid consistency: Thin  Consider Eliquis start on 5th day post event, around 5/26-27 On ASA 81mg  for the present   Therapy recommendations:  SNF vs. Home health vs. ALF (if home ALF can provide care needed)  Disposition: TBD  Transfer to floor today   Chronic atrial fibrillation  Rate controlled on home cardizem  Patient & son agreeable to Eliquis as above   Hypertension  Initially required cleviprex infusion   Home cardizem on board, home lasix on hold for the       present . Keep systolic BP less than 440 . Long-term BP goal normotensive  Hyperlipidemia  Lipitor 10mg    LDL 74, not quite at goal < 70  High intensity statin not warranted as essentially at goal   Continue statin at discharge  Other Stroke Risk Factors  Advanced Age >/= 49    Coronary artery disease  Other Active Problems  I have personally obtained history,examined this patient, reviewed notes, independently viewed imaging studies, participated in medical decision making and plan of care.ROS completed by me personally and pertinent positives fully documented  I have made any additions or clarifications directly to  the above note. Agree with note above.  Patient is doing remarkably well given her age.  Expect ongoing improvement in the next few days.  Start aspirin 81 mg daily now and switch to Eliquis after 5 days.  Long discussion patient and son at the bedside about risk benefit of anticoagulation and given a fall risk she had been off it in the past.  Now they are willing to go back on Eliquis.  Mobilize out of bed.  Continue ongoing therapies.  Transfer to neurology floor bed.  Transfer to skilled nursing facility in the next few days when bed available and after insurance approval.This patient is critically ill and at significant risk of neurological worsening, death and care requires constant monitoring of vital signs, hemodynamics,respiratory and cardiac monitoring, extensive review of multiple databases, frequent neurological assessment, discussion with family, other specialists and medical decision making of high complexity.I have made any additions or clarifications directly to the above note.This critical care time does not reflect procedure time, or teaching time or supervisory time  of PA/NP/Med Resident etc but could involve care discussion time.  I spent 30 minutes of neurocritical care time  in the care of  this patient.     Antony Contras, MD Medical Director [facility] Pager: [phone] 07/13/2020 4:36 PM

## 2020-07-13 NOTE — Evaluation (Signed)
Occupational Therapy Evaluation Patient Details Name: Sheryl Suarez MRN: [id number] DOB: 07-09-1923 Today's Date: 07/13/2020    History of Present Illness 87 y.o. female who presented 5/21 with R-sided weakness and inability to talk. tPA was administered. MRI of head revealed acute L MCA infarct involving the posterior insula and parietal operculum. PMH: atrial fibrillation not on anticoagulation, CHF, CAD, HTN, and history of breast cancer now in remission.   Clinical Impression   PTA, pt reports living at home alone and independent with ADLs and IADLs; however, pt with confusion and information does not match chart review. Currently, pt requires Min Guard A for UB ADLs, Max A for LB ADLs, and Min A for functional mobility. Pt requires maximal verbal cues for sequencing and safety during ADL tasks. HR elevates with activity to 140-150 BPM. Will continue to provide OT services at acute level to improve functional mobility, strength, and independence in ADL tasks. Recommend SNF for continued OT services to optimize safety and independence during ADLs.      Follow Up Recommendations  SNF (From ALF, and if able to receive increased support, possibly return with [facility].)    Equipment Recommendations  3 in 1 bedside commode    Recommendations for Other Services       Precautions / Restrictions Precautions Precautions: Fall      Mobility Bed Mobility Overal bed mobility: Needs Assistance Bed Mobility: Supine to Sit     Supine to sit: Min guard     General bed mobility comments: Pt able to manage legs off bed and ascend trunk with increased time. Pt benefitted from cues to scoot to EOB to achieve feet on floor.    Transfers Overall transfer level: Needs assistance Equipment used: 1 person hand held assist Transfers: Sit to/from Stand Sit to Stand: Min assist;Min guard         General transfer comment: Min Guard for safety after powering up from EOB. MinA to power up from  toilet.    Balance Overall balance assessment: Needs assistance Sitting-balance support: No upper extremity supported;Feet supported Sitting balance-Leahy Scale: Fair Sitting balance - Comments: No UE support with poor ability to reach to feet in attempt to pull up socks. Requiring [employer] for safety.   Standing balance support: Single extremity supported;During functional activity Standing balance-Leahy Scale: Poor Standing balance comment: Pt requires minA and single UE support. Increased effort and cues for safety.                           ADL either performed or assessed with clinical judgement   ADL Overall ADL's : Needs assistance/impaired Eating/Feeding: Set up   Grooming: Wash/dry face;Supervision/safety;Sitting;Wash/dry hands;Minimal assistance;Standing Grooming Details (indicate cue type and reason): Pt performed hand hygiene at sink with Min A for balance. Pt able to wipe face to clean cheek. Unaware of saliva on cheek, requiring cues to wipe face. Upper Body Bathing: Minimal assistance;Sitting;Cueing for sequencing   Lower Body Bathing: Maximal assistance;Sitting/lateral leans;Cueing for sequencing;Cueing for safety   Upper Body Dressing : Minimal assistance;Cueing for sequencing;Sitting   Lower Body Dressing: Maximal assistance;Cueing for sequencing;Sit to/from stand Lower Body Dressing Details (indicate cue type and reason): Pt required MaxA to pull up socks prior to standing. Poor sitting balance and ROM. Toilet Transfer: Minimal assistance;Grab Haematologist;Ambulation;Cueing for sequencing;Cueing for safety Toilet Transfer Details (indicate cue type and reason): Requiring Max cues for safety and sequencing to obtain optimal positioning over toilet and lower to  toilet. Pt required MinA to initiate power up from toilet. Toileting- Clothing Manipulation and Hygiene: Minimal assistance;Sit to/from stand Toileting - Clothing Manipulation Details  (indicate cue type and reason): Pt required additional time for clothing management prior to toileting and to move gown to wipe in standing and benefitted from MinA for balance.     Functional mobility during ADLs: Cueing for safety;Minimal assistance General ADL Comments: Pt benefitted from max cues for safety awareness during ADL tasks to safely complete transfers. Pt. required verbal cues to hold OT hand to steady for balance during functional mobility. Pt presents with poor balance, strength, and cognition impacting independence in ADLs.     Vision Baseline Vision/History: Wears glasses Wears Glasses: Reading only Patient Visual Report: No change from baseline Vision Assessment?: No apparent visual deficits     Perception     Praxis      Pertinent Vitals/Pain Pain Assessment: Faces Faces Pain Scale: No hurt Pain Intervention(s): Monitored during session     Hand Dominance Right   Extremity/Trunk Assessment Upper Extremity Assessment Upper Extremity Assessment: RUE deficits/detail RUE Deficits / Details: Decreased strength compared to LUE. Poor AROM, shoulder hiking when reaching 90 degrees active shoulder flexion. Pt reports change in sensation and that her arm "feels different". Pt able to complete finger opposition with slightly decreased precision. Tremors at baseline.   Lower Extremity Assessment Lower Extremity Assessment: Defer to PT evaluation   Cervical / Trunk Assessment Cervical / Trunk Assessment: Kyphotic   Communication Communication Communication: HOH;Expressive difficulties   Cognition Arousal/Alertness: Awake/alert Behavior During Therapy: WFL for tasks assessed/performed Overall Cognitive Status: No family/caregiver present to determine baseline cognitive functioning                                 General Comments: Pt demonstrates difficulty sequencing tasks and apraxia.  PT demonstrates STM impairments and perceverated on her daughter's  occasional assistance with tasks at home. Pt experienced difficlty with word finding and would state "Sh*t Sheryl Suarez" expressing frustration.   General Comments  Pt heart rate elevated to 140-150BPM with activity. Pt SpO2 in upper 80s througthout session, however, PLETH line inconsistent. Pt denied SOB or being dizzy. Skin color, work of breathing, and RR remained normal throughout session.    Exercises     Shoulder Instructions      Home Living Family/patient expects to be discharged to:: Private residence Living Arrangements: Alone Available Help at Discharge: Family;Available PRN/intermittently                             Additional Comments: Pt reports she lives alone in "my house". Reports her daughter comes to help. This is inconsistent with information collected by PT at eval yesterday. Unsure of reliability      Prior Functioning/Environment Level of Independence: Needs assistance        Comments: Pt reporting she is independent with ADLs and IADLs, however, inconsistent with PT eval and chart review.        OT Problem List: Decreased strength;Decreased range of motion;Decreased activity tolerance;Impaired balance (sitting and/or standing);Decreased cognition;Decreased safety awareness;Decreased knowledge of use of DME or AE;Impaired sensation      OT Treatment/Interventions: Self-care/ADL training;Therapeutic exercise;DME and/or AE instruction;Therapeutic activities;Cognitive remediation/compensation    OT Goals(Current goals can be found in the care plan section) Acute Rehab OT Goals Patient Stated Goal: to be able to speak better OT Goal  Formulation: With patient Time For Goal Achievement: 07/28/20 Potential to Achieve Goals: Good  OT Frequency: Min 2X/week   Barriers to D/C:            Co-evaluation              AM-PAC OT "6 Clicks" Daily Activity     Outcome Measure Help from another person eating meals?: A Little Help from another person  taking care of personal grooming?: A Little Help from another person toileting, which includes using toliet, bedpan, or urinal?: A Lot Help from another person bathing (including washing, rinsing, drying)?: A Lot Help from another person to put on and taking off regular upper body clothing?: A Little Help from another person to put on and taking off regular lower body clothing?: A Lot 6 Click Score: 15   End of Session Equipment Utilized During Treatment: Gait belt Nurse Communication: Mobility status (HR)  Activity Tolerance: Patient tolerated treatment well Patient left: in chair;Other (comment) (With PT)  OT Visit Diagnosis: Unsteadiness on feet (R26.81);Muscle weakness (generalized) (M62.81);Other symptoms and signs involving cognitive function;Cognitive communication deficit (R41.841) Symptoms and signs involving cognitive functions: Cerebral infarction                Time: 8338-2505 OT Time Calculation (min): 22 min Charges:  OT General Charges $OT Visit: 1 Visit OT Evaluation $OT Eval Moderate Complexity: 1 [address] OTDS  Shanda Howells 07/13/2020, 1:16 PM

## 2020-07-13 NOTE — Progress Notes (Signed)
Modified Barium Swallow Progress Note  Patient Details  Name: Sheryl Suarez MRN: [id number] Date of Birth: 07/25/1923  Today's Date: 07/13/2020  Modified Barium Swallow completed.  Full report located under Chart Review in the Imaging Section.  Brief recommendations include the following:  Clinical Impression  Demonstrated during study was delayed swallow onset, residue and silent aspiraiton. Reduced labial control led to anterior leakage and delayed tranist from oral perspective. Her timing was dysfunctional durning swallow preventing full laryngeal closure leading to pyriform sinus aggregation that was aspirated when swallow initiated. No immediate or delayed reflexive awareness. There was vallecular and pyrifom sinus residue inconsistently. She was able to tuck chin which reduced laryngeal intrusion to minimal and penetration on cords. Cautious recommendation is nectar thick liquids, continue Dys 3, pills whole in puree and intermittent throat clear. Continue ST and pt/family educaiton.   Swallow Evaluation Recommendations       SLP Diet Recommendations: Dysphagia 3 (Mech soft) solids;Nectar thick liquid   Liquid Administration via: Cup   Medication Administration: Whole meds with puree   Supervision: Patient able to self feed;Full supervision/cueing for compensatory strategies;Staff to assist with self feeding   Compensations: Slow rate;Small sips/bites   Postural Changes: Seated upright at 90 degrees   Oral Care Recommendations: Oral care BID        Houston Siren 07/13/2020,2:22 PM Orbie Pyo Colvin Caroli.Ed Risk analyst [phone] Office [phone]

## 2020-07-13 NOTE — Progress Notes (Addendum)
  Speech Language Pathology Treatment: Dysphagia  Patient Details Name: KALLISTA PAE MRN: [id number] DOB: 1923-12-01 Today's Date: 07/13/2020 Time: 4098-1191 SLP Time Calculation (min) (ACUTE ONLY): 25 min  Assessment / Plan / Recommendation Clinical Impression  Pt seen for dysphagia at onset of session with son Elta Guadeloupe arriving towards the end. Explained clinical reasoning for recommending MBS after consistent wet vocal quality and intermittent throat clears. MBS scheduled for 1:30 and can continue Dys 3 texture and recommend sticking to water only prior to study.     HPI HPI: Patient is a 87 y.o. female with PMH: atrial fibrillation not on anticoagulation, CHF, CAD, HTN, h/o breast cancer now in remission, was on hospice at some point but taken off hospice due to improved from mediation adjustment. She was brought to the ER from SNF for evaluation of new onset right-sided weakness and inability to talk. At baseline, reportedly patient is able to get around using walker and has conversations fairly normally per SNF staff report. CT head was negative for CVA, MRI Acute left MCA infarct involving the posterior insula and parietal  operculum      SLP Plan  MBS       Recommendations  Diet recommendations: Thin liquid Liquids provided via: Cup Medication Administration: Whole meds with puree Supervision: Patient able to self feed;Intermittent supervision to cue for compensatory strategies Compensations: Slow rate;Small sips/bites Postural Changes and/or Swallow Maneuvers: Seated upright 90 degrees                Oral Care Recommendations: Oral care BID Follow up Recommendations: 24 hour supervision/assistance;Skilled Nursing facility SLP Visit Diagnosis: Dysphagia, unspecified (R13.10);Aphasia (R47.01) Plan: MBS                       Houston Siren 07/13/2020, 9:52 AM   Orbie Pyo Colvin Caroli.Ed Risk analyst [phone] Office  [phone]

## 2020-07-13 NOTE — Evaluation (Addendum)
Speech Language Pathology Evaluation Patient Details Name: Sheryl Suarez MRN: [id number] DOB: 1923-03-07 Today's Date: 07/13/2020 Time: 7902-4097 SLP Time Calculation (min) (ACUTE ONLY): 16 min  Problem List:  Patient Active Problem List   Diagnosis Date Noted  . Pressure injury of skin 07/12/2020  . Acute ischemic stroke ([place]) 07/11/2020  . Acute diastolic CHF (congestive heart failure) ([place])   . Acute respiratory failure with hypoxia ([place])   . Acute congestive heart failure ([place])   . Frequent falls   . Bradycardia   . HTN (hypertension) 04/23/2019  . HLD (hyperlipidemia) 04/23/2019  . UTI (urinary tract infection) 04/23/2019  . Closed fracture of right olecranon process   . Fall   . Generalized weakness   . Closed fracture dislocation of right elbow 01/15/2019  . Closed rib fracture 01/15/2019  . HTN (hypertension), benign 01/15/2019  . Acute on chronic systolic CHF (congestive heart failure) ([place]) 01/15/2019  . CAD (coronary artery disease) 01/15/2019  . Atrial fibrillation, chronic ([place]) 01/15/2019  . Rhabdomyolysis 01/15/2019  . GI bleed 08/18/2015   Past Medical History:  Past Medical History:  Diagnosis Date  . Arrhythmia    atrial fibrillation  . Breast cancer ([place])    remission  . CHF (congestive heart failure) ([place])   . Coronary artery disease   . Hypertension   . Stroke [facility])    Past Surgical History:  Past Surgical History:  Procedure Laterality Date  . ABDOMINAL HYSTERECTOMY    . APPENDECTOMY    . BREAST IMPLANT EXCHANGE    . CHOLECYSTECTOMY    . ESOPHAGOGASTRODUODENOSCOPY (EGD) WITH PROPOFOL N/A 08/20/2015   Procedure: ESOPHAGOGASTRODUODENOSCOPY (EGD) WITH PROPOFOL;  Surgeon: Lollie Sails, MD;  Location: [facility] ENDOSCOPY;  Service: Endoscopy;  Laterality: N/A;  . MASTECTOMY Bilateral   . ORIF ELBOW FRACTURE Right 01/16/2019   Procedure: OPEN REDUCTION INTERNAL FIXATION (ORIF) ELBOW/OLECRANON FRACTURE;  Surgeon: Earnestine Leys, MD;  Location: [facility]  ORS;  Service: Orthopedics;  Laterality: Right;   HPI:  Patient is a 87 y.o. female with PMH: atrial fibrillation not on anticoagulation, CHF, CAD, HTN, h/o breast cancer now in remission, was on hospice at some point but taken off hospice due to improved from mediation adjustment. She was brought to the ER from SNF for evaluation of new onset right-sided weakness and inability to talk. At baseline, reportedly patient is able to get around using walker and has conversations fairly normally per SNF staff report. CT head was negative for CVA, MRI Acute left MCA infarct involving the posterior insula and parietal  operculum   Assessment / Plan / Recommendation Clinical Impression  Pt exhibits mild-mod Broca's aphasia and apraxic characteristics that are improving from initial presention per documentation and son. Speech is dysfluent marked by dysnomias of semantic and phonemic paraphasia's and neologiosms for 70% of output. Presently written information is minimally-mildy helpful. Comprehension of basic information is midl-moderately impaired. She wrote her name and needed assist with children's name. Basic cognition is overtly intact. Recomemnd continued ST on acute and home health.    SLP Assessment  SLP Recommendation/Assessment: Patient needs continued Speech Lanaguage Pathology Services SLP Visit Diagnosis: Cognitive communication deficit (R41.841)    Follow Up Recommendations  24 hour supervision/assistance;Skilled Nursing facility    Frequency and Duration min 2x/week  2 weeks      SLP Evaluation Cognition  Overall Cognitive Status: Impaired/Different from baseline Arousal/Alertness: Awake/alert Orientation Level: Oriented to person;Oriented to situation;Oriented to place Attention: Sustained Sustained Attention: Appears intact Memory:  (to be  assessed) Awareness: Impaired Awareness Impairment: Anticipatory impairment Problem Solving: Impaired Problem Solving Impairment: Functional  basic Safety/Judgment: Impaired       Comprehension  Auditory Comprehension Overall Auditory Comprehension: Impaired Yes/No Questions: Impaired Basic Biographical Questions: 51-75% accurate Basic Immediate Environment Questions: 50-74% accurate Commands: Impaired One Step Basic Commands: 50-74% accurate Two Step Basic Commands: 25-49% accurate Visual Recognition/Discrimination Discrimination: Not tested Reading Comprehension Reading Status: Impaired Word level: Impaired    Expression Expression Primary Mode of Expression: Verbal Verbal Expression Overall Verbal Expression: Impaired Initiation: Impaired Level of Generative/Spontaneous Verbalization: Phrase;Sentence Naming: Impairment Confrontation: Impaired Verbal Errors: Semantic paraphasias;Phonemic paraphasias;Neologisms Pragmatics: No impairment Written Expression Dominant Hand: Right   Oral / Motor  Oral Motor/Sensory Function Overall Oral Motor/Sensory Function: Mild impairment Facial ROM: Reduced right Facial Symmetry: Abnormal symmetry right Facial Strength: Reduced right Lingual ROM: Within Functional Limits Lingual Symmetry: Within Functional Limits Lingual Strength: Reduced Lingual Sensation: Reduced;Suspected CN VII (facial) dysfunction-anterior 2/3 tongue Motor Speech Overall Motor Speech: Impaired Phonation: Normal Resonance: Within functional limits Articulation: Impaired Level of Impairment: Sentence Intelligibility: Intelligible Motor Planning: Impaired Level of Impairment: Phrase Motor Speech Errors: Aware   GO                    Houston Siren 07/13/2020, 10:25 AM  Orbie Pyo Colvin Caroli.Ed Risk analyst [phone] Office [phone]

## 2020-07-14 DIAGNOSIS — I639 Cerebral infarction, unspecified: Secondary | ICD-10-CM | POA: Diagnosis not present

## 2020-07-14 NOTE — Progress Notes (Signed)
STROKE TEAM PROGRESS NOTE   INTERVAL HISTORY  No acute events overnight.  She is doing very well with improved speech. Placed on dysphagia 3 diet following modified barium.  Lives in ALF in [place].  PT/OT recommends home health PT/SNF/24hr supervision. CSW contacted [facility] ALF and they plan to come and evaluate the patient today.  If they are able to to provide the care and therapy that she currently needs she will be transferred back to the facility.   Neurological exam is unchanged.  Vital signs are stable. Vitals:   07/13/20 2300 07/14/20 0300 07/14/20 0700 07/14/20 1100  BP: (!) 134/55 131/62 (!) 146/64 (!) 142/80  Pulse: 74 74 70 97  Resp: 16 18 13  (!) 22  Temp: 97.6 F (36.4 C) 97.7 F (36.5 C) 97.8 F (36.6 C) 98.2 F (36.8 C)  TempSrc: Axillary Oral Oral Oral  SpO2: 96% 97% 96% 95%  Weight:      Height:       CBC:  Recent Labs  Lab 07/11/20 1204 07/11/20 1214  WBC 6.0  --   NEUTROABS 3.7  --   HGB 13.7 14.3  HCT 44.2 42.0  MCV 94.0  --   PLT 223  --    Basic Metabolic Panel:  Recent Labs  Lab 07/11/20 1204 07/11/20 1214  NA 139 140  K 3.9 4.1  CL 105 105  CO2 26  --   GLUCOSE 98 96  BUN 24* 31*  CREATININE 1.00 0.80  CALCIUM 9.5  --    Lipid Panel:  Recent Labs  Lab 07/12/20 0255  CHOL 128  TRIG 58  HDL 42  CHOLHDL 3.0  VLDL 12  LDLCALC 74   HgbA1c:  Recent Labs  Lab 07/12/20 0255  HGBA1C 5.3   Urine Drug Screen: No results for input(s): LABOPIA, COCAINSCRNUR, LABBENZ, AMPHETMU, THCU, LABBARB in the last 168 hours.  Alcohol Level No results for input(s): ETH in the last 168 hours.  IMAGING past 24 hours  1. Occlusion left P2/P3 segment likely acute 2. Moderate to severe stenosis left M3 segment supplying the left frontal parietal lobe. 3. Advanced atherosclerotic disease aortic arch and proximal left subclavian artery 4. Mild atherosclerotic disease in the carotid bifurcation bilaterally. Moderate stenosis in the  cavernous carotid bilaterally due to atherosclerotic disease. 5. Moderate to severe stenosis distal left vertebral artery. 6. These results were called by telephone at the time of interpretation on 07/11/2020 at 12:49 pm to provider ASHISH ARORA , who verbally acknowledged these results  PHYSICAL EXAM General awake alert in no distress HEENT: Normocephalic/atraumatic Lungs: Scattered rales Cardiovascular: Irregularly irregular Extremities warm well perfused Neurological exam She is awake, alert she has mild expressive aphasia with word finding difficulty and word hesitancy.  She can follow simple one to two-step commands.  She is able to name and repeat. Cranial nerves: Pupils equal round reactive light, left gaze preference, is able to come to midline but not look to the right consistently, does not blink to threat from the right, mild right lower facial asymmetry. Motor exam: Right upper extremity weakness but able to lift off the bed., 3/5 right lower extremity.  Left upper 4/5.  Left lower 4/5. Sensory exam: appears intact Coordination improving  ASSESSMENT/PLAN  Sheryl Suarez is a 87 y.o. female past medical of atrial fibrillation not on anticoagulation, CHF, coronary artery disease, hypertension, history of breast cancer now in remission, at some point on hospice but taken off of hospice because of improvement after  medication adjustment, brought into the emergency room for evaluation of sudden onset of right-sided weakness and inability to talk. tPA was administered.   Left hemispheric embolic infarct on atrial fibrillation and is not on long-term anticoagulation due to history of frequent falls   CT head no acute abnormality   CTA head & neck: Occlusion left P2/P3 segment likely acute,  Moderate to severe stenosis left M3 segment supplying the left frontal parietal lobe, Advanced atherosclerotic disease aortic arch and proximal left subclavian artery, Mild atherosclerotic disease  in the carotid bifurcation bilaterally. Moderate stenosis in the cavernous carotid bilaterally due to atherosclerotic disease, Moderate to severe stenosis distal left vertebral artery.   MRI acute left MCA infarct involving posterior insula and parietal operculum.  No hemorrhage  2D Echo EF 50%, Left atrial size was severely dilated, Right atrial size was mildly dilated, mild pulm HTN, No shunt, thrombus or wall motion abnormality  LDL 74  HgbA1c 5.3  VTE prophylaxis - SCDs    Diet   DIET DYS 3 Room service appropriate? Yes with Assist; Fluid consistency: Nectar Thick  Consider Eliquis start on 5th day post event (5/26) On ASA 81mg  for the present   Therapy recommendations:  SNF vs. Home health vs. ALF (if home ALF can provide care needed)  Disposition: TBD  Chronic atrial fibrillation  Rate controlled on home cardizem  Patient & son agreeable to Eliquis as above   Hypertension  Initially required cleviprex infusion   Diltiazem 120mg  daily, home lasix on hold for the present  Hydralazine prn . Keep systolic BP less than 811 . Long-term BP goal normotensive  Hyperlipidemia  Lipitor 10mg    LDL 74, not quite at goal < 70  High intensity statin not warranted as essentially at goal   Continue statin at discharge  Other Stroke Risk Factors  Advanced Age >/= 44    Coronary artery disease  Other Active Problems I have personally obtained history,examined this patient, reviewed notes, independently viewed imaging studies, participated in medical decision making and plan of care.ROS completed by me personally and pertinent positives fully documented  I have made any additions or clarifications directly to the above note. Agree with note above.  Social worker to look into transferring the patient back to assisted living facility for ongoing therapy needs.  Social worker spoke to her ALF facility and someone from that will be coming over to assist the patient to see if they  can take her back.  Long discussion patient and answered questions.  Greater than 50% time during this 25-minute visit was spent in counseling and coordination of care and code stroke and therapy needs and answering questions and discussion with care team.  Antony Contras, MD Medical Director [place] Pager: [phone] 07/14/2020 3:59 PM

## 2020-07-14 NOTE — Care Management Important Message (Signed)
Important Message  Patient Details  Name: Sheryl Suarez MRN: [id number] Date of Birth: 1923/09/27   Medicare Important Message Given:  Yes     Tracker Mance Montine Circle 07/14/2020, 4:02 PM

## 2020-07-14 NOTE — TOC Initial Note (Signed)
Transition of Care [facility]) - Initial/Assessment Note    Patient Details  Name: Sheryl Suarez MRN: [id number] Date of Birth: 06-Mar-1923  Transition of Care [facility]) CM/SW Contact:    Geralynn Ochs, LCSW Phone Number: 07/14/2020, 2:33 PM  Clinical Narrative:      CSW notified by Stroke NP that patient could be discharged back to ALF, if they will accept her. CSW contacted [facility] and spoke with nurse tech, who indicated that a nurse would have to come and evaluate. CSW waited for a call, and called [facility] back to check on a nurse coming out. CSW spoke with Lattie Haw at [facility], she will come out this afternoon but was under the impression that the family wanted SNF. CSW contacted daughter [name] to confirm preference to return to [facility], if possible. CSW updated [name] that Lattie Haw will be coming out to assess and determine if they can take her back. CSW faxed clinical updates to [facility] for review, and will continue to follow. CSW updated MD on barrier to discharge.             Expected Discharge Plan: Assisted Living Barriers to Discharge: Continued Medical Work up   Patient Goals and CMS Choice Patient states their goals for this hospitalization and ongoing recovery are:: patient unable to participate in goal setting CMS Medicare.gov Compare Post Acute Care list provided to:: Patient Represenative (must comment) Choice offered to / list presented to : Adult Children  Expected Discharge Plan and Services Expected Discharge Plan: Assisted Living     Post Acute Care Choice: Home Health Living arrangements for the past 2 months: [place]                                      Prior Living Arrangements/Services Living arrangements for the past 2 months: [place] Lives with:: Facility Resident Patient language and need for interpreter reviewed:: No Do you feel safe going back to the place where you live?: Yes      Need for Family  Participation in Patient Care: Yes (Comment) Care giver support system in place?: Yes (comment) Current home services: DME Criminal Activity/Legal Involvement Pertinent to Current Situation/Hospitalization: No - Comment as needed  Activities of Daily Living      Permission Sought/Granted Permission sought to share information with : Facility Retail banker granted to share information with : Yes, Verbal Permission Granted  Share Information with NAME: [name]  Permission granted to share info w AGENCY: [facility]  Permission granted to share info w Relationship: Daughter     Emotional Assessment   Attitude/Demeanor/Rapport: Unable to Assess Affect (typically observed): Unable to Assess Orientation: : Oriented to Self Alcohol / Substance Use: Not Applicable Psych Involvement: No (comment)  Admission diagnosis:  Acute ischemic stroke [facility]) [I63.9] Cerebrovascular accident (CVA), unspecified mechanism ([place]) [I63.9] Patient Active Problem List   Diagnosis Date Noted  . Pressure injury of skin 07/12/2020  . Acute ischemic stroke ([place]) 07/11/2020  . Acute diastolic CHF (congestive heart failure) ([place])   . Acute respiratory failure with hypoxia ([place])   . Acute congestive heart failure ([place])   . Frequent falls   . Bradycardia   . HTN (hypertension) 04/23/2019  . HLD (hyperlipidemia) 04/23/2019  . UTI (urinary tract infection) 04/23/2019  . Closed fracture of right olecranon process   . Fall   . Generalized weakness   . Closed fracture  dislocation of right elbow 01/15/2019  . Closed rib fracture 01/15/2019  . HTN (hypertension), benign 01/15/2019  . Acute on chronic systolic CHF (congestive heart failure) ([place]) 01/15/2019  . CAD (coronary artery disease) 01/15/2019  . Atrial fibrillation, chronic ([place]) 01/15/2019  . Rhabdomyolysis 01/15/2019  . GI bleed 08/18/2015   PCP:  Leonel Ramsay, MD Pharmacy:  No Pharmacies Listed    Social  Determinants of Health (SDOH) Interventions    Readmission Risk Interventions No flowsheet data found.

## 2020-07-14 NOTE — Progress Notes (Signed)
Physical Therapy Treatment Patient Details Name: Sheryl Suarez MRN: [id number] DOB: 09-02-1923 Today's Date: 07/14/2020    History of Present Illness 87 y.o. female who presented 5/21 with R-sided weakness and inability to talk. tPA was administered. MRI of head revealed acute L MCA infarct involving the posterior insula and parietal operculum. PMH: atrial fibrillation not on anticoagulation, CHF, CAD, HTN, and history of breast cancer now in remission.    PT Comments    Pt progressing towards goals. Requiring min A to stand and min guard A for mobility using RW. Pt reporting she did not feel as tired as she normally does. Feel if ALF can provide necessary support, she should be able to return with HHPT. If not, pt will require SNF level therapies. Will continue to follow acutely.    Follow Up Recommendations  Home health PT;SNF;Supervision/Assistance - 24 hour (depending on assist ALF can provide)     Equipment Recommendations  None recommended by PT    Recommendations for Other Services       Precautions / Restrictions Precautions Precautions: Fall Restrictions Weight Bearing Restrictions: No    Mobility  Bed Mobility Overal bed mobility: Needs Assistance Bed Mobility: Supine to Sit     Supine to sit: Min guard     General bed mobility comments: Min guard for safety. Cues for sequencing.    Transfers Overall transfer level: Needs assistance Equipment used: Rolling walker (2 wheeled) Transfers: Sit to/from Stand Sit to Stand: Min assist         General transfer comment: Min A for steadying to stand. Mild posterior LOB initially.  Ambulation/Gait Ambulation/Gait assistance: Min guard Gait Distance (Feet): 100 Feet Assistive device: Rolling walker (2 wheeled) Gait Pattern/deviations: Step-through pattern;Narrow base of support;Trunk flexed Gait velocity: Decreased   General Gait Details: Short steps, but improved balance this session. Min guard for safety.  Flexed posture noted.   Stairs             Wheelchair Mobility    Modified Rankin (Stroke Patients Only) Modified Rankin (Stroke Patients Only) Pre-Morbid Rankin Score: Moderately severe disability Modified Rankin: Moderately severe disability     Balance Overall balance assessment: Needs assistance Sitting-balance support: No upper extremity supported;Feet supported Sitting balance-Leahy Scale: Fair     Standing balance support: Bilateral upper extremity supported Standing balance-Leahy Scale: Poor Standing balance comment: Reliant on UE support                            Cognition Arousal/Alertness: Awake/alert Behavior During Therapy: WFL for tasks assessed/performed Overall Cognitive Status: History of cognitive impairments - at baseline                                 General Comments: Memory deficits at baseline. Presenting with difficulty sequencing. Getting frustrated when experiencing expressive difficulty.      Exercises      General Comments        Pertinent Vitals/Pain Pain Assessment: Faces Faces Pain Scale: No hurt    Home Living                      Prior Function            PT Goals (current goals can now be found in the care plan section) Acute Rehab PT Goals Patient Stated Goal: to go back to [facility] PT Goal Formulation:  With patient Time For Goal Achievement: 07/26/20 Potential to Achieve Goals: Good Progress towards PT goals: Progressing toward goals    Frequency    Min 3X/week      PT Plan Current plan remains appropriate    Co-evaluation              AM-PAC PT "6 Clicks" Mobility   Outcome Measure  Help needed turning from your back to your side while in a flat bed without using bedrails?: A Little Help needed moving from lying on your back to sitting on the side of a flat bed without using bedrails?: A Little Help needed moving to and from a bed to a chair (including a  wheelchair)?: A Little Help needed standing up from a chair using your arms (e.g., wheelchair or bedside chair)?: A Little Help needed to walk in hospital room?: A Little Help needed climbing 3-5 steps with a railing? : A Lot 6 Click Score: 17    End of Session Equipment Utilized During Treatment: Gait belt Activity Tolerance: Patient tolerated treatment well Patient left: in chair;with call bell/phone within reach;with chair alarm set Nurse Communication: Mobility status PT Visit Diagnosis: Unsteadiness on feet (R26.81);Other abnormalities of gait and mobility (R26.89);Muscle weakness (generalized) (M62.81);Difficulty in walking, not elsewhere classified (R26.2);Apraxia (R48.2);Other symptoms and signs involving the nervous system (R29.898);Hemiplegia and hemiparesis Hemiplegia - Right/Left: Right Hemiplegia - dominant/non-dominant: Dominant Hemiplegia - caused by: Cerebral infarction     Time: 4827-0786 PT Time Calculation (min) (ACUTE ONLY): 20 min  Charges:  $Gait Training: 8-22 mins                     Lou Miner, DPT  Acute Rehabilitation Services  Pager: [phone] Office: [phone]    Rudean Hitt 07/14/2020, 4:42 PM

## 2020-07-15 DIAGNOSIS — I639 Cerebral infarction, unspecified: Secondary | ICD-10-CM | POA: Diagnosis not present

## 2020-07-15 LAB — BASIC METABOLIC PANEL
Anion gap: 11 (ref 5–15)
BUN: 17 mg/dL (ref 8–23)
CO2: 22 mmol/L (ref 22–32)
Calcium: 8.9 mg/dL (ref 8.9–10.3)
Chloride: 103 mmol/L (ref 98–111)
Creatinine, Ser: 1.03 mg/dL — ABNORMAL HIGH (ref 0.44–1.00)
GFR, Estimated: 50 mL/min — ABNORMAL LOW (ref >60)
Glucose, Bld: 147 mg/dL — ABNORMAL HIGH (ref 70–99)
Potassium: 4 mmol/L (ref 3.5–5.1)
Sodium: 136 mmol/L (ref 135–145)

## 2020-07-15 LAB — CBC
HCT: 39.1 % (ref 36.0–46.0)
Hemoglobin: 12.6 g/dL (ref 12.0–15.0)
MCH: 29.6 pg (ref 26.0–34.0)
MCHC: 32.2 g/dL (ref 30.0–36.0)
MCV: 91.8 fL (ref 80.0–100.0)
Platelets: 172 10*3/uL (ref 150–400)
RBC: 4.26 MIL/uL (ref 3.87–5.11)
RDW: 14.3 % (ref 11.5–15.5)
WBC: 17.9 10*3/uL — ABNORMAL HIGH (ref 4.0–10.5)
nRBC: 0 % (ref 0.0–0.2)

## 2020-07-15 LAB — GLUCOSE, CAPILLARY: Glucose-Capillary: 154 mg/dL — ABNORMAL HIGH (ref 70–99)

## 2020-07-15 MED ORDER — ENOXAPARIN SODIUM 30 MG/0.3ML IJ SOSY
30.0000 mg | PREFILLED_SYRINGE | INTRAMUSCULAR | Status: DC
  Administered 2020-07-15: 30 mg via SUBCUTANEOUS
  Filled 2020-07-15: qty 0.3

## 2020-07-15 MED ORDER — MIRTAZAPINE 15 MG PO TABS
15.0000 mg | ORAL_TABLET | Freq: Every day | ORAL | Status: DC
  Administered 2020-07-15 – 2020-07-22 (×8): 15 mg via ORAL
  Filled 2020-07-15 (×8): qty 1

## 2020-07-15 MED ORDER — ONDANSETRON HCL 4 MG/2ML IJ SOLN
4.0000 mg | Freq: Once | INTRAMUSCULAR | Status: AC
  Administered 2020-07-16: 4 mg via INTRAVENOUS
  Filled 2020-07-15: qty 2

## 2020-07-15 MED ORDER — METOPROLOL TARTRATE 5 MG/5ML IV SOLN
5.0000 mg | Freq: Once | INTRAVENOUS | Status: AC
  Administered 2020-07-15: 5 mg via INTRAVENOUS

## 2020-07-15 MED ORDER — ONDANSETRON 4 MG PO TBDP: 4.0000 mg | ORAL_TABLET | Freq: Once | ORAL | Status: DC

## 2020-07-15 MED ORDER — METOPROLOL TARTRATE 5 MG/5ML IV SOLN
INTRAVENOUS | Status: AC
  Filled 2020-07-15: qty 5

## 2020-07-15 MED ORDER — DIPHENHYDRAMINE HCL 50 MG/ML IJ SOLN
25.0000 mg | Freq: Once | INTRAMUSCULAR | Status: AC
  Administered 2020-07-15: 25 mg via INTRAVENOUS
  Filled 2020-07-15: qty 1

## 2020-07-15 NOTE — Progress Notes (Signed)
STROKE TEAM PROGRESS NOTE   INTERVAL HISTORY  Patient nauseated last night with elevated HR 120-130s.  She was given IV benadryl 25mg  and metoprolol 5mg  IV once.  Repeat EKG with sinus tach, 1st degree AV block, QTC 408.  Neurological exam is unchanged    Shivering this morning stating that she is cold. Temp 97.9. RN placed warm blankets on her.  Oxygen saturation was low in the 60s due to patient shivering and cold.  Supplemental oxygen was placed.  After rewarmed with blankets she was satting 98% on RA.  Supplemental O2 discontinued.   Had an episode of nausea and vomiting.  Zofran ordered.  Will continue to monitor.   Daughter called ALF in [place] and they are requesting further PT/OT documentation.  CSW was made aware and is in close communication with them.  Awaiting ALF to come and evaluate the patient today.  If they are able to provide the care and therapy that she currently needs she will be transferred back to the facility.  Requests have also been made for SNF placement.  Neurological exam is unchanged.  Vital signs are stable. Vitals:   07/15/20 0329 07/15/20 0719 07/15/20 1112 07/15/20 1140  BP:  134/66 (!) 151/81   Pulse:  (!) 119 98   Resp: 18     Temp: 98 F (36.7 C) 98.6 F (37 C) 97.9 F (36.6 C) (!) 97.3 F (36.3 C)  TempSrc: Oral Oral Oral   SpO2: 98% 92% 93%   Weight:      Height:       CBC:  Recent Labs  Lab 07/11/20 1204 07/11/20 1214  WBC 6.0  --   NEUTROABS 3.7  --   HGB 13.7 14.3  HCT 44.2 42.0  MCV 94.0  --   PLT 223  --    Basic Metabolic Panel:  Recent Labs  Lab 07/11/20 1204 07/11/20 1214  NA 139 140  K 3.9 4.1  CL 105 105  CO2 26  --   GLUCOSE 98 96  BUN 24* 31*  CREATININE 1.00 0.80  CALCIUM 9.5  --    Lipid Panel:  Recent Labs  Lab 07/12/20 0255  CHOL 128  TRIG 58  HDL 42  CHOLHDL 3.0  VLDL 12  LDLCALC 74   HgbA1c:  Recent Labs  Lab 07/12/20 0255  HGBA1C 5.3   Urine Drug Screen: No results for input(s): LABOPIA,  COCAINSCRNUR, LABBENZ, AMPHETMU, THCU, LABBARB in the last 168 hours.  Alcohol Level No results for input(s): ETH in the last 168 hours.  IMAGING past 24 hours  1. Occlusion left P2/P3 segment likely acute 2. Moderate to severe stenosis left M3 segment supplying the left frontal parietal lobe. 3. Advanced atherosclerotic disease aortic arch and proximal left subclavian artery 4. Mild atherosclerotic disease in the carotid bifurcation bilaterally. Moderate stenosis in the cavernous carotid bilaterally due to atherosclerotic disease. 5. Moderate to severe stenosis distal left vertebral artery. 6. These results were called by telephone at the time of interpretation on 07/11/2020 at 12:49 pm to provider ASHISH ARORA , who verbally acknowledged these results  PHYSICAL EXAM General awake alert in no distress HEENT: Normocephalic/atraumatic Lungs: Scattered rales Cardiovascular: Irregularly irregular Extremities warm well perfused Neurological exam She is awake, alert she has mild expressive aphasia with word finding difficulty and word hesitancy.  She can follow simple one to two-step commands.  She is able to name and repeat. Cranial nerves: Pupils equal round reactive light, left gaze preference, is able  to come to midline but not look to the right consistently, does not blink to threat from the right, mild right lower facial asymmetry. Motor exam: Right upper extremity weakness but able to lift off the bed., 3/5 right lower extremity.  Left upper 4/5.  Left lower 4/5. Sensory exam: appears intact Coordination improving  ASSESSMENT/PLAN  Sheryl Suarez is a 87 y.o. female past medical of atrial fibrillation not on anticoagulation, CHF, coronary artery disease, hypertension, history of breast cancer now in remission, at some point on hospice but taken off of hospice because of improvement after medication adjustment, brought into the emergency room for evaluation of sudden onset of  right-sided weakness and inability to talk. tPA was administered.   Left hemispheric embolic infarct on atrial fibrillation and is not on long-term anticoagulation due to history of frequent falls   CT head no acute abnormality   CTA head & neck: Occlusion left P2/P3 segment likely acute,  Moderate to severe stenosis left M3 segment supplying the left frontal parietal lobe, Advanced atherosclerotic disease aortic arch and proximal left subclavian artery, Mild atherosclerotic disease in the carotid bifurcation bilaterally. Moderate stenosis in the cavernous carotid bilaterally due to atherosclerotic disease, Moderate to severe stenosis distal left vertebral artery.   MRI acute left MCA infarct involving posterior insula and parietal operculum.  No hemorrhage  2D Echo EF 50%, Left atrial size was severely dilated, Right atrial size was mildly dilated, mild pulm HTN, No shunt, thrombus or wall motion abnormality  LDL 74  HgbA1c 5.3  VTE prophylaxis - SCDs add lovenox    Diet   DIET DYS 3 Room service appropriate? Yes with Assist; Fluid consistency: Nectar Thick  Consider Eliquis start on 5th day post event (5/26) On ASA 81mg  for the present   Therapy recommendations:  SNF vs. ALF (if home ALF can provide care needed)  Disposition: TBD  Chronic atrial fibrillation  Rate controlled on home cardizem  Patient & son agreeable to Eliquis as above   Hypertension  Initially required cleviprex infusion   Diltiazem 120mg  daily, home lasix on hold for the present  Hydralazine prn . Keep systolic BP less than 096 . Long-term BP goal normotensive  Hyperlipidemia  Lipitor 10mg    LDL 74, not quite at goal < 70  High intensity statin not warranted as essentially at goal   Continue statin at discharge  Other Stroke Risk Factors  Advanced Age >/= 78    Coronary artery disease  Other active problems  Nausea/vomiting: Zofran x1  [name], ACNP-BC Stroke NP I  have personally obtained history,examined this patient, reviewed notes, independently viewed imaging studies, participated in medical decision making and plan of care.ROS completed by me personally and pertinent positives fully documented  I have made any additions or clarifications directly to the above note. Agree with note above.  Continue aspirin today and switch over to Eliquis tomorrow.  Await decision from her assisted living facility whether she can go back to bed otherwise will have to go to skilled nursing facility.  Discussed with social worker and care team.  Greater than 50% time during this 25-minute visit was spent in counseling and coordination of care and discussion with care team  Antony Contras, MD Medical Director [place] Pager: [phone] 07/15/2020 2:42 PM

## 2020-07-15 NOTE — Progress Notes (Signed)
  Speech Language Pathology Treatment: Dysphagia;Cognitive-Linquistic  Patient Details Name: Sheryl Suarez MRN: [id number] DOB: [birth year] Today's Date: 07/15/2020 Time: 4098-1191 SLP Time Calculation (min) (ACUTE ONLY): 15 min  Assessment / Plan / Recommendation Clinical Impression  Pt has been nauseated today but stated she was thirsty. SLP thickened thin ginger ale at bedside to nectar thickness, left 2 packets Simply thick in room and notified RN. Pt cued (tactile) to slow rate and sips to 1-2 at a time. Her voice was noted wet. Nectar thick consistency facilitates safety but cannot eliminate risk. Recommend controlled environment and precautions of nectar thick and Dys 3 textures. Pt did not voice or indicate dislike of thickener. If she becomes hesitant or prefers thin in the future, it is reasonable to have that conversation with family for increased quality of life.  Dysfluent and dysnomic in words and sentences with emergent awareness of errors most of the time. Given phrase completion cues without success. Named 2 of 3 children accurately. Family not present to educate on language strategies.  After delay and multiple attempts she stated she lives in [place]. Continue ST    HPI HPI: Patient is a 87 y.o. female with PMH: atrial fibrillation not on anticoagulation, CHF, CAD, HTN, h/o breast cancer now in remission, was on hospice at some point but taken off hospice due to improved from mediation adjustment. She was brought to the ER from SNF for evaluation of new onset right-sided weakness and inability to talk. At baseline, reportedly patient is able to get around using walker and has conversations fairly normally per SNF staff report. CT head was negative for CVA, MRI Acute left MCA infarct involving the posterior insula and parietal  operculum      SLP Plan  Continue with current plan of care       Recommendations  Diet recommendations: Nectar-thick liquid;Dysphagia 3 (mechanical  soft) Liquids provided via: Cup Medication Administration: Whole meds with puree Supervision: Patient able to self feed;Full supervision/cueing for compensatory strategies Compensations: Slow rate;Small sips/bites Postural Changes and/or Swallow Maneuvers: Seated upright 90 degrees                Oral Care Recommendations: Oral care BID Follow up Recommendations: 24 hour supervision/assistance;Skilled Nursing facility SLP Visit Diagnosis: Dysphagia, unspecified (R13.10);Aphasia (R47.01) Plan: Continue with current plan of care       Rowan, Maddy Graham Willis 07/15/2020, 2:26 PM

## 2020-07-15 NOTE — Progress Notes (Signed)
Brief Neuro update:  Notified by RN that patient is having nausea, is anxious and her HR is up to 120s-130s. BP is up to 156/77. She was given hydralazine that is ordered prn.  Plan: - One time dose of IV Benadryl 25mg  along with Metoprolol 5mg  IV once ordered. Have to be cautious given QTc of 488 on prior EKG from 07/11/20. - Will get a repeat EKG.  [place] Pager Number [phone]

## 2020-07-15 NOTE — Progress Notes (Signed)
Occupational Therapy Treatment Patient Details Name: Sheryl Suarez MRN: [id number] DOB: 1923/10/29 Today's Date: 07/15/2020    History of present illness 87 y.o. female who presented 5/21 with R-sided weakness and inability to talk. tPA was administered. MRI of head revealed acute L MCA infarct involving the posterior insula and parietal operculum. PMH: atrial fibrillation not on anticoagulation, CHF, CAD, HTN, and history of breast cancer now in remission.   OT comments  Patient in bed, very restless and complaining of unable to get comfortable or stay warm.  Attempted to change positions from supine to sitting in the recliner.  Patient's HR jumped to 140's from 93 initially.  Patient unable to tolerate sitting edge of the bed, and requesting to lie back down.  Mod A this date for basic mobility, and the patient is unable to really describe if anything was hurting, or if she was uncomfortable, she kept repeating "I'll be fine".  Nursing notified, and in to check on patient.  OT unable to adjust discharge recommendation from SNF this date based on today's treatment.  PT is scheduled to see her later today, and OT will continue efforts as appropriate in the acute setting.    Follow Up Recommendations  SNF    Equipment Recommendations  3 in 1 bedside commode    Recommendations for Other Services      Precautions / Restrictions Precautions Precautions: Fall Restrictions Weight Bearing Restrictions: No       Mobility Bed Mobility   Bed Mobility: Supine to Sit;Sit to Supine     Supine to sit: Mod assist Sit to supine: Mod assist   General bed mobility comments: increasing HR to 140 Patient Response: Anxious;Restless  Transfers                 General transfer comment: deferred, patient unable to tolerate sitting EOB and requesitn to lie down.    Balance Overall balance assessment: Needs assistance Sitting-balance support: Feet supported Sitting balance-Leahy Scale:  Poor   Postural control: Right lateral lean                                                      Cognition Arousal/Alertness: Awake/alert Behavior During Therapy: WFL for tasks assessed/performed Overall Cognitive Status: History of cognitive impairments - at baseline                                                            Pertinent Vitals/ Pain       Pain Assessment: Faces Faces Pain Scale: Hurts little more Pain Location: generalized Pain Descriptors / Indicators: Grimacing;Guarding Pain Intervention(s): Monitored during session;Other (comment) (notified nursing)                                                          Frequency  Min 2X/week        Progress Toward Goals  OT Goals(current goals can now be found in the care plan section)  Acute Rehab OT Goals OT Goal Formulation: Patient unable to participate in goal setting Time For Goal Achievement: 07/28/20 Potential to Achieve Goals: [place] Discharge plan remains appropriate    Co-evaluation                 AM-PAC OT "6 Clicks" Daily Activity     Outcome Measure   Help from another person eating meals?: A Little Help from another person taking care of personal grooming?: A Little Help from another person toileting, which includes using toliet, bedpan, or urinal?: Total Help from another person bathing (including washing, rinsing, drying)?: Total Help from another person to put on and taking off regular upper body clothing?: A Little Help from another person to put on and taking off regular lower body clothing?: Total 6 Click Score: 12    End of Session    OT Visit Diagnosis: Unsteadiness on feet (R26.81);Muscle weakness (generalized) (M62.81);Other symptoms and signs involving cognitive function;Cognitive communication deficit (R41.841) Symptoms and signs involving cognitive functions: Cerebral infarction    Activity Tolerance Patient limited by pain   Patient Left in bed;with call bell/phone within reach;with family/visitor present   Nurse Communication Other (comment) (patient difficulty getting comfortable and warm)        Time: 0786-7544 OT Time Calculation (min): 17 min  Charges: OT General Charges $OT Visit: 1 Visit OT Treatments $Therapeutic Activity: 8-22 mins  07/15/2020  Rich, OTR/L  Acute Rehabilitation Services  Office:  [place] 07/15/2020, 11:43 AM

## 2020-07-15 NOTE — TOC Progression Note (Signed)
Transition of Care [facility]) - Progression Note    Patient Details  Name: Sheryl Suarez MRN: [id number] Date of Birth: 1923-06-17  Transition of Care [facility]) CM/SW [place], [place] Phone Number: 07/15/2020, 1:55 PM  Clinical Narrative:   CSW received call from [place] with [facility] this morning that they are thinking the patient will need SNF, that her current needs are more than what they can provide. CSW discussed with Lattie Haw their concerns, and per Lattie Haw the patient was mostly independent prior to coming in and they cannot increase level of assist. Per Lattie Haw, they are unable to assist the patient with eating her meals. CSW indicated that there was an additional PT note from yesterday afternoon, asked about sending that over to see if they'd change their minds, as the family would like her to return home. CSW sent PT update this morning, will continue to update [place] to try to advocate for patient to return home, but also faxed out referral for SNF. Noting per chart review medical issues today, so CSW will hold off on sending today's therapy updates to [facility]. CSW to follow for disposition needs.    Expected Discharge Plan: [place] Barriers to Discharge: Continued Medical Work up  Expected Discharge Plan and Services Expected Discharge Plan: [place] Choice: [place] arrangements for the past 2 months: [place]                                       Social Determinants of Health (SDOH) Interventions    Readmission Risk Interventions No flowsheet data found.

## 2020-07-15 NOTE — Progress Notes (Addendum)
Bladder scanned d/t no urine output, resulted 674ml. MD notified In/cath released 200 ml amber, cloudy with sediment, retention of 538ml, thick urine unable to pass through 16FR I/O intermittent cath. MD referred to Urology. Urology placed N.O. night shift RN given update on status and will insert cath to perform flushes as ordered.

## 2020-07-15 NOTE — Progress Notes (Signed)
RN notified by PT that patient was shivering, warm blankets given, HR from 108-149, then patient vomited  MD notified, N.O for zofran. Will monitor for effectivness

## 2020-07-15 NOTE — Progress Notes (Signed)
PT Cancellation Note  Patient Details Name: Sheryl Suarez MRN: [id number] DOB: 10-Nov-1923   Cancelled Treatment:    Reason Eval/Treat Not Completed: Medical issues which prohibited therapy Patient currently limited by nausea and vomitting with elevated HR. PT will re-attempt as time allows.   Murvin Gift A. Gilford Rile PT, DPT Acute Rehabilitation Services Pager [phone] Office [phone]    Linna Hoff 07/15/2020, 12:33 PM

## 2020-07-15 NOTE — NC FL2 (Addendum)
[place] MEDICAID FL2 LEVEL OF CARE SCREENING TOOL     IDENTIFICATION  Patient Name: Sheryl Suarez Birthdate: 1924/01/30 Sex: female Admission Date (Current Location): 07/11/2020  [facility] and [place] Number:  Herbalist and Address:  [facility] [place]. [facility], [place] [address] [place], [place] [address]      Provider Number: [id number]  Attending Physician Name and Address:  Stroke, Md, MD  Relative Name and Phone Number:       Current Level of Care: Hospital Recommended Level of Care: [place] Prior Approval Number:    Date Approved/Denied:   PASRR Number: [id number]  Discharge Plan: SNF    Current Diagnoses: Patient Active Problem List   Diagnosis Date Noted   Pressure injury of skin 07/12/2020   Acute ischemic stroke ([place]) 29/51/8841   Acute diastolic CHF (congestive heart failure) ([place])    Acute respiratory failure with hypoxia (HCC)    Acute congestive heart failure (HCC)    Frequent falls    Bradycardia    HTN (hypertension) 04/23/2019   HLD (hyperlipidemia) 04/23/2019   UTI (urinary tract infection) 04/23/2019   Closed fracture of right olecranon process    Fall    Generalized weakness    Closed fracture dislocation of right elbow 01/15/2019   Closed rib fracture 01/15/2019   HTN (hypertension), benign 01/15/2019   Acute on chronic systolic CHF (congestive heart failure) ([place]) 01/15/2019   CAD (coronary artery disease) 01/15/2019   Atrial fibrillation, chronic ([place]) 01/15/2019   Rhabdomyolysis 01/15/2019   GI bleed 08/18/2015    Orientation RESPIRATION BLADDER Height & Weight     Self,Situation,Place  Normal Incontinent Weight: 120 lb 13 oz (54.8 kg) Height:  5\' 2"  (157.5 cm)  BEHAVIORAL SYMPTOMS/MOOD NEUROLOGICAL BOWEL NUTRITION STATUS      Continent Diet (see DC summary)  AMBULATORY STATUS COMMUNICATION OF NEEDS Skin   Limited Assist Verbally PU Stage and Appropriate Care   PU Stage 2 Dressing:  (lower  sacrum, foam dressing: lift every shift to assess and change PRN)                   Personal Care Assistance Level of Assistance  Bathing,Feeding,Dressing Bathing Assistance: Limited assistance Feeding assistance: Limited assistance Dressing Assistance: Limited assistance     Functional Limitations Info  Speech     Speech Info: Impaired (expressive aphasia)    SPECIAL CARE FACTORS FREQUENCY  PT (By licensed PT),OT (By licensed OT),Speech therapy     PT Frequency: 5x/wk OT Frequency: 5x/wk     Speech Therapy Frequency: 5x/wk      Contractures Contractures Info: Not present    Additional Factors Info  Code Status,Allergies,Psychotropic Code Status Info: DNR Allergies Info: NKA Psychotropic Info: Remeron 15mg  daily at bed         Current Medications (07/15/2020):  This is the current hospital active medication list Current Facility-Administered Medications  Medication Dose Route Frequency Provider Last Rate Last Admin   acetaminophen (TYLENOL) tablet 650 mg  650 mg Oral Q4H PRN Bailey-Modzik, Delila A, NP       Or   acetaminophen (TYLENOL) 160 MG/5ML solution 650 mg  650 mg Per Tube Q4H PRN Bailey-Modzik, Delila A, NP       Or   acetaminophen (TYLENOL) suppository 650 mg  650 mg Rectal Q4H PRN Bailey-Modzik, Delila A, NP       aspirin EC tablet 81 mg  81 mg Oral Daily Bailey-Modzik, Delila A, NP   81  mg at 07/15/20 0832   atorvastatin (LIPITOR) tablet 10 mg  10 mg Oral Daily Bailey-Modzik, Delila A, NP   10 mg at 07/15/20 3662   Chlorhexidine Gluconate Cloth 2 % PADS 6 each  6 each Topical Q0600 Bailey-Modzik, Delila A, NP   6 each at 07/13/20 1100   diltiazem (CARDIZEM CD) 24 hr capsule 120 mg  120 mg Oral Daily Bailey-Modzik, Delila A, NP   120 mg at 07/15/20 0832   enoxaparin (LOVENOX) injection 30 mg  30 mg Subcutaneous Q24H Olivencia-Simmons, Ivelisse, NP       hydrALAZINE (APRESOLINE) injection 10 mg  10 mg Intravenous Q6H PRN Bailey-Modzik, Delila A, NP   10  mg at 07/15/20 0132   MEDLINE mouth rinse  15 mL Mouth Rinse BID Bailey-Modzik, Delila A, NP   15 mL at 07/15/20 0845   metoprolol tartrate (LOPRESSOR) 5 MG/5ML injection            mirtazapine (REMERON) tablet 15 mg  15 mg Oral QHS Garvin Fila, MD       ondansetron (ZOFRAN) injection 4 mg  4 mg Intravenous Once Olivencia-Simmons, Ivelisse, NP       senna-docusate (Senokot-S) tablet 1 tablet  1 tablet Oral QHS PRN Bailey-Modzik, Delila A, NP         Discharge Medications: Please see discharge summary for a list of discharge medications.  Relevant Imaging Results:  Relevant Lab Results:   Additional Information SS#: [id number]  Geralynn Ochs, LCSW   I have personally obtained history,examined this patient, reviewed notes, independently viewed imaging studies, participated in medical decision making and plan of care.ROS completed by me personally and pertinent positives fully documented  I have made any additions or clarifications directly to the above note. Agree with note above.    Antony Contras, MD Medical Director [facility] Pager: [phone] 07/15/2020 3:36 PM

## 2020-07-16 ENCOUNTER — Inpatient Hospital Stay (HOSPITAL_COMMUNITY): Payer: Medicare Other

## 2020-07-16 DIAGNOSIS — Z66 Do not resuscitate: Secondary | ICD-10-CM | POA: Diagnosis present

## 2020-07-16 DIAGNOSIS — I10 Essential (primary) hypertension: Secondary | ICD-10-CM

## 2020-07-16 DIAGNOSIS — I639 Cerebral infarction, unspecified: Secondary | ICD-10-CM | POA: Diagnosis not present

## 2020-07-16 DIAGNOSIS — A419 Sepsis, unspecified organism: Secondary | ICD-10-CM | POA: Diagnosis not present

## 2020-07-16 DIAGNOSIS — Z7189 Other specified counseling: Secondary | ICD-10-CM

## 2020-07-16 HISTORY — DX: Sepsis, unspecified organism: A41.9

## 2020-07-16 LAB — URINALYSIS, ROUTINE W REFLEX MICROSCOPIC
Bilirubin Urine: NEGATIVE
Glucose, UA: NEGATIVE mg/dL
Ketones, ur: NEGATIVE mg/dL
Nitrite: NEGATIVE
Protein, ur: 100 mg/dL — AB
RBC / HPF: >50 RBC/hpf — ABNORMAL HIGH (ref 0–5)
Specific Gravity, Urine: 1.014 (ref 1.005–1.030)
WBC, UA: >50 WBC/hpf — ABNORMAL HIGH (ref 0–5)
pH: 6 (ref 5.0–8.0)

## 2020-07-16 LAB — BASIC METABOLIC PANEL
Anion gap: 8 (ref 5–15)
BUN: 21 mg/dL (ref 8–23)
CO2: 24 mmol/L (ref 22–32)
Calcium: 9.1 mg/dL (ref 8.9–10.3)
Chloride: 105 mmol/L (ref 98–111)
Creatinine, Ser: 0.98 mg/dL (ref 0.44–1.00)
GFR, Estimated: 53 mL/min — ABNORMAL LOW (ref >60)
Glucose, Bld: 93 mg/dL (ref 70–99)
Potassium: 3.5 mmol/L (ref 3.5–5.1)
Sodium: 137 mmol/L (ref 135–145)

## 2020-07-16 LAB — CBC
HCT: 40.9 % (ref 36.0–46.0)
Hemoglobin: 13.2 g/dL (ref 12.0–15.0)
MCH: 29.6 pg (ref 26.0–34.0)
MCHC: 32.3 g/dL (ref 30.0–36.0)
MCV: 91.7 fL (ref 80.0–100.0)
Platelets: 167 10*3/uL (ref 150–400)
RBC: 4.46 MIL/uL (ref 3.87–5.11)
RDW: 14.5 % (ref 11.5–15.5)
WBC: 8.1 10*3/uL (ref 4.0–10.5)
nRBC: 0 % (ref 0.0–0.2)

## 2020-07-16 LAB — LACTIC ACID, PLASMA
Lactic Acid, Venous: 2.9 mmol/L (ref 0.5–1.9)
Lactic Acid, Venous: 3.1 mmol/L (ref 0.5–1.9)

## 2020-07-16 LAB — PROTIME-INR
INR: 1.3 — ABNORMAL HIGH (ref 0.8–1.2)
Prothrombin Time: 15.7 seconds — ABNORMAL HIGH (ref 11.4–15.2)

## 2020-07-16 LAB — PROCALCITONIN: Procalcitonin: 38.22 ng/mL

## 2020-07-16 LAB — APTT: aPTT: 34 seconds (ref 24–36)

## 2020-07-16 IMAGING — DX DG CHEST 1V PORT
1 series · 1 of 1 positions shown · non-contrast
Comparison: [DATE].

CLINICAL DATA: Fever.

EXAM:
PORTABLE CHEST 1 VIEW

[chest]
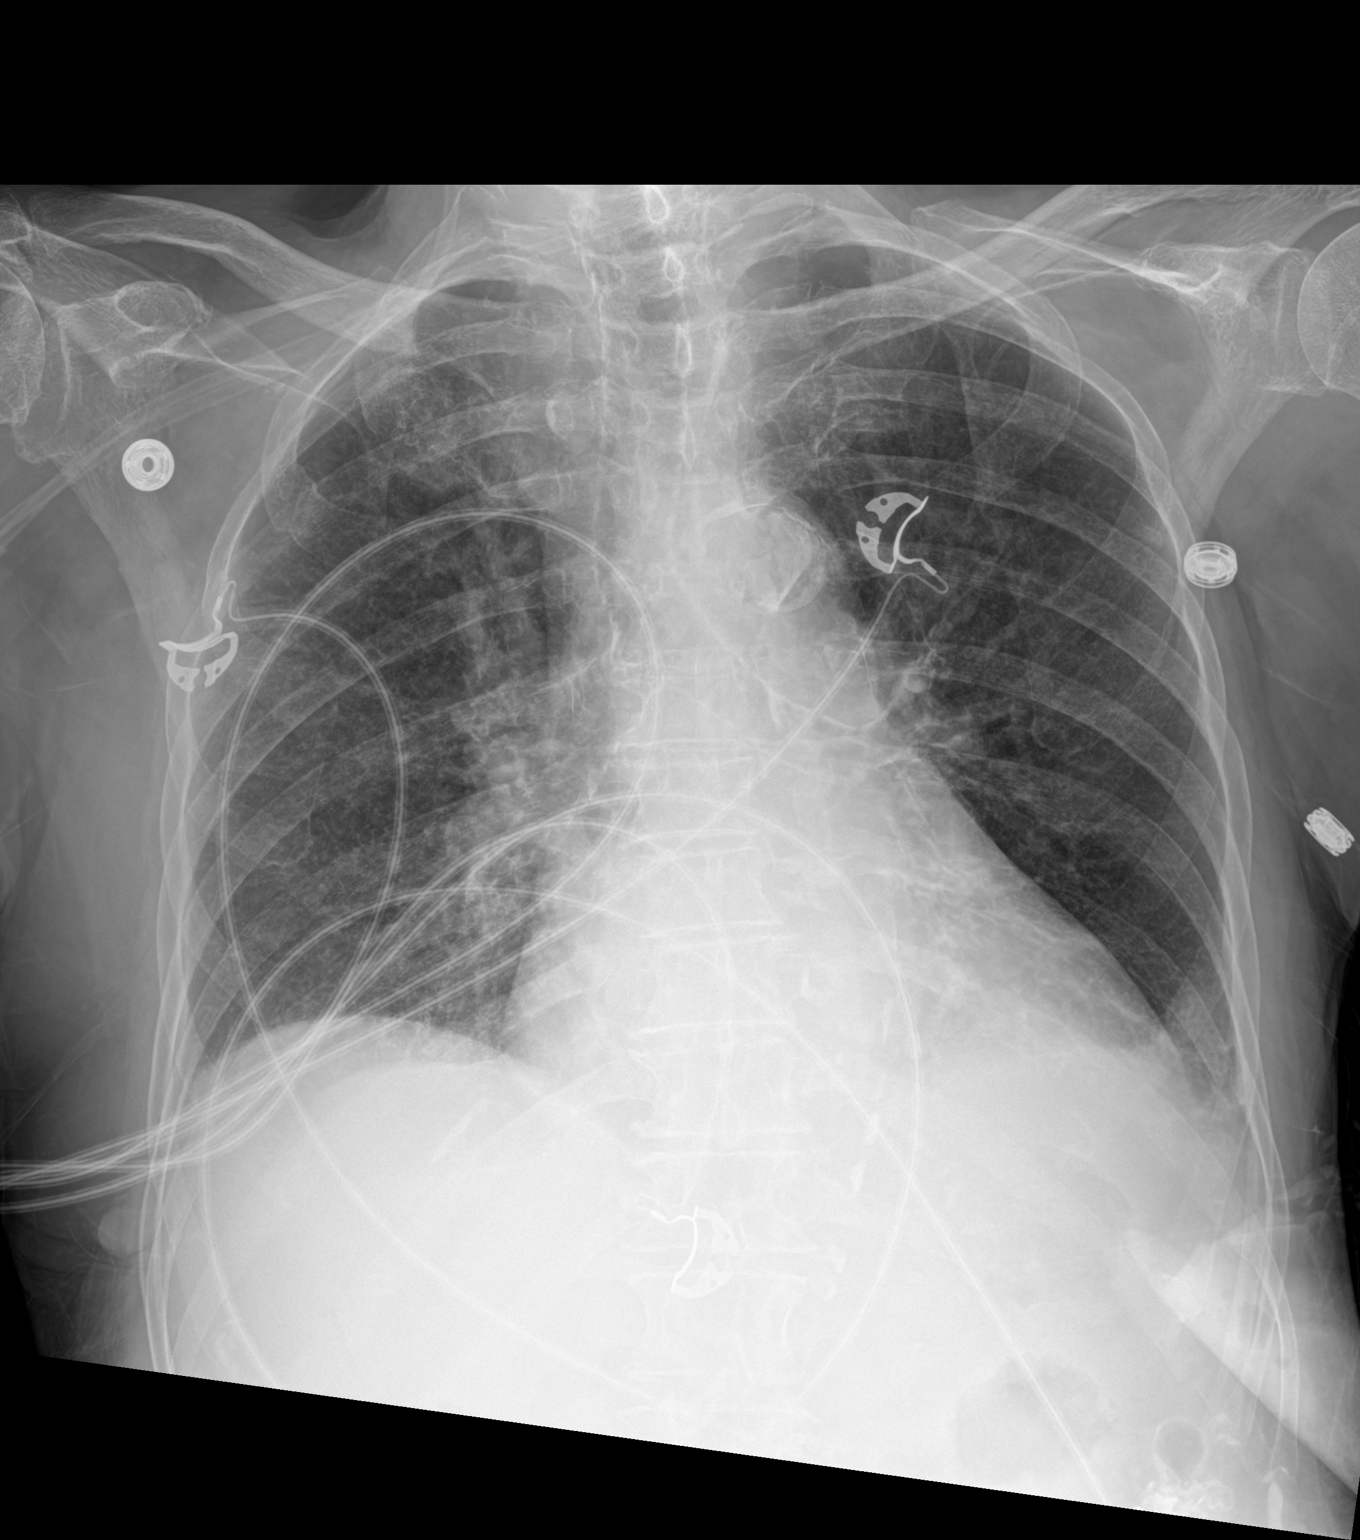

[1 of 1 positions shown; findings below may reference images not displayed]

FINDINGS: Mediastinum and hilar structures normal. Cardiomegaly. Mild
pulmonary venous congestion. Mild bilateral interstitial prominence
noted. Tiny left pleural effusion. No pneumothorax. No acute bony
abnormality.
IMPRESSION: 1.  Cardiomegaly.  Mild pulmonary venous congestion.

2. Mild bilateral interstitial prominence. Mild interstitial edema
and/or pneumonitis can not be excluded. Tiny left pleural effusion
cannot excluded.

## 2020-07-16 MED ORDER — HALOPERIDOL 0.5 MG PO TABS
0.5000 mg | ORAL_TABLET | ORAL | Status: DC | PRN
  Filled 2020-07-16: qty 1

## 2020-07-16 MED ORDER — SODIUM CHLORIDE 0.9 % IV SOLN: 1.0000 g | INTRAVENOUS | Status: DC

## 2020-07-16 MED ORDER — APIXABAN 2.5 MG PO TABS
2.5000 mg | ORAL_TABLET | Freq: Two times a day (BID) | ORAL | Status: DC
  Administered 2020-07-16: 2.5 mg via ORAL
  Filled 2020-07-16: qty 1

## 2020-07-16 MED ORDER — LORAZEPAM 2 MG/ML IJ SOLN: 1.0000 mg | INTRAMUSCULAR | Status: DC | PRN

## 2020-07-16 MED ORDER — SODIUM CHLORIDE 0.9 % IV SOLN: 2.0000 g | Freq: Once | INTRAVENOUS | Status: DC

## 2020-07-16 MED ORDER — SODIUM CHLORIDE 0.9 % IV SOLN
3.0000 g | Freq: Two times a day (BID) | INTRAVENOUS | Status: DC
  Administered 2020-07-16 – 2020-07-17 (×2): 3 g via INTRAVENOUS
  Filled 2020-07-16: qty 3
  Filled 2020-07-16 (×3): qty 8

## 2020-07-16 MED ORDER — SODIUM CHLORIDE 0.9 % IV SOLN
1.0000 g | Freq: Once | INTRAVENOUS | Status: AC
  Administered 2020-07-16: 1 g via INTRAVENOUS
  Filled 2020-07-16: qty 10

## 2020-07-16 MED ORDER — LACTATED RINGERS IV SOLN: INTRAVENOUS | Status: DC

## 2020-07-16 MED ORDER — GLYCOPYRROLATE 0.2 MG/ML IJ SOLN: 0.2000 mg | INTRAMUSCULAR | Status: DC | PRN

## 2020-07-16 MED ORDER — HALOPERIDOL LACTATE 2 MG/ML PO CONC
0.5000 mg | ORAL | Status: DC | PRN
  Filled 2020-07-16: qty 0.3

## 2020-07-16 MED ORDER — METOPROLOL TARTRATE 5 MG/5ML IV SOLN
2.5000 mg | Freq: Once | INTRAVENOUS | Status: AC
  Administered 2020-07-16: 2.5 mg via INTRAVENOUS
  Filled 2020-07-16: qty 5

## 2020-07-16 MED ORDER — BIOTENE DRY MOUTH MT LIQD: 15.0000 mL | OROMUCOSAL | Status: DC | PRN

## 2020-07-16 MED ORDER — ONDANSETRON HCL 4 MG/2ML IJ SOLN: 4.0000 mg | Freq: Four times a day (QID) | INTRAMUSCULAR | Status: DC | PRN

## 2020-07-16 MED ORDER — GLYCOPYRROLATE 1 MG PO TABS
1.0000 mg | ORAL_TABLET | ORAL | Status: DC | PRN
  Filled 2020-07-16: qty 1

## 2020-07-16 MED ORDER — DIPHENHYDRAMINE HCL 50 MG/ML IJ SOLN: 12.5000 mg | INTRAMUSCULAR | Status: DC | PRN

## 2020-07-16 MED ORDER — LORAZEPAM 2 MG/ML PO CONC: 1.0000 mg | ORAL | Status: DC | PRN

## 2020-07-16 MED ORDER — LORAZEPAM 1 MG PO TABS: 1.0000 mg | ORAL_TABLET | ORAL | Status: DC | PRN

## 2020-07-16 MED ORDER — MORPHINE SULFATE (PF) 2 MG/ML IV SOLN: 1.0000 mg | INTRAVENOUS | Status: DC | PRN

## 2020-07-16 MED ORDER — SODIUM CHLORIDE 0.9 % IV BOLUS
500.0000 mL | Freq: Once | INTRAVENOUS | Status: AC
  Administered 2020-07-16: 500 mL via INTRAVENOUS

## 2020-07-16 MED ORDER — LACTATED RINGERS IV BOLUS
500.0000 mL | Freq: Once | INTRAVENOUS | Status: AC
  Administered 2020-07-16: 500 mL via INTRAVENOUS

## 2020-07-16 MED ORDER — LACTATED RINGERS IV BOLUS: 500.0000 mL | Freq: Once | INTRAVENOUS | Status: DC

## 2020-07-16 MED ORDER — POLYVINYL ALCOHOL 1.4 % OP SOLN: 1.0000 [drp] | Freq: Four times a day (QID) | OPHTHALMIC | Status: DC | PRN

## 2020-07-16 MED ORDER — HALOPERIDOL LACTATE 5 MG/ML IJ SOLN
0.5000 mg | INTRAMUSCULAR | Status: DC | PRN
Start: 2020-07-16 — End: 2020-07-23

## 2020-07-16 MED ORDER — ONDANSETRON 4 MG PO TBDP: 4.0000 mg | ORAL_TABLET | Freq: Four times a day (QID) | ORAL | Status: DC | PRN

## 2020-07-16 NOTE — TOC Progression Note (Signed)
Transition of Care [facility]) - Progression Note    Patient Details  Name: Sheryl Suarez MRN: [id number] Date of Birth: August 27, 1923  Transition of Care [facility]) CM/SW [place], [place] Phone Number: 07/16/2020, 4:06 PM  Clinical Narrative:     CSW spoke with Lattie Haw at [place]. She said she was able to visit pt and pt was still very lethargic upon visit and Nanine Means would still need pt to go to SNF for rehab prior to return. PT/OT notes from today don't indicate a ton of improvement.   CSW called pt daughter [name]; no answer. CSW left message that [facility] Means still said no as well as current snf offers.   Expected Discharge Plan: [place] Barriers to Discharge: Continued Medical Work up  Expected Discharge Plan and Services Expected Discharge Plan: [place] Choice: [place] arrangements for the past 2 months: [place]                                       Social Determinants of Health (SDOH) Interventions    Readmission Risk Interventions No flowsheet data found.

## 2020-07-16 NOTE — Progress Notes (Signed)
PT Cancellation Note  Patient Details Name: Sheryl Suarez MRN: [id number] DOB: [birth year]   Cancelled Treatment:    Reason Eval/Treat Not Completed: Fatigue/lethargy limiting ability to participate Patient asleep in chair, currently getting bladder scanned. Lab waiting to draw lactic acid. PT will re-attempt as time allows.   Destynee Stringfellow A. Gilford Rile PT, DPT Acute Rehabilitation Services Pager [phone] Office [phone]    Linna Hoff 07/16/2020, 3:19 PM

## 2020-07-16 NOTE — Sepsis Progress Note (Signed)
elink monitoring code sepsis.  

## 2020-07-16 NOTE — Sepsis Progress Note (Signed)
Notified bedside nurse of need to draw repeat lactic acid (3rd). Bedside RN confirmed she would notify lab.

## 2020-07-16 NOTE — Progress Notes (Signed)
STROKE TEAM PROGRESS NOTE   INTERVAL HISTORY  Febrile overnight with Tmax: 101.8, WBC up to 17.9 yesterday but down to 8.1 this morning.  Afib RVR (HR 130s) this am.  EKG obtained.  NS 587ml bolus given.  RN stated that patient has foul-smelling urine with sediments.  Had urinary retension requiring foley placement.  UA with + leucocytes and WBC >50.  Urine culture pending.  On IV cefepime initially started but changed to Unasyn. Lactate 2.9. LR 557ml bolus given. LR 76ml/hr IVF started.  Repeat lacate 3.1. procalcitonin 38.2.  Medical team consulted today.  Neurological exam remains unchanged.    Vitals:   07/16/20 0819 07/16/20 0920 07/16/20 1015 07/16/20 1100  BP: (!) 141/107 (!) 142/72 114/61 130/72  Pulse: (!) 125 90 73 89  Resp: 20 20 (!) 22 20  Temp: (!) 101.6 F (38.7 C) (!) 101.2 F (38.4 C) (!) 101.2 F (38.4 C) 99.6 F (37.6 C)  TempSrc:    Axillary  SpO2: 96% 97% 97%   Weight:      Height:       CBC:  Recent Labs  Lab 07/11/20 1204 07/11/20 1214 07/15/20 1502 07/16/20 0833  WBC 6.0  --  17.9* 8.1  NEUTROABS 3.7  --   --   --   HGB 13.7   < > 12.6 13.2  HCT 44.2   < > 39.1 40.9  MCV 94.0  --  91.8 91.7  PLT 223  --  172 167   < > = values in this interval not displayed.   Basic Metabolic Panel:  Recent Labs  Lab 07/15/20 1502 07/16/20 0833  NA 136 137  K 4.0 3.5  CL 103 105  CO2 22 24  GLUCOSE 147* 93  BUN 17 21  CREATININE 1.03* 0.98  CALCIUM 8.9 9.1   Lipid Panel:  Recent Labs  Lab 07/12/20 0255  CHOL 128  TRIG 58  HDL 42  CHOLHDL 3.0  VLDL 12  LDLCALC 74   HgbA1c:  Recent Labs  Lab 07/12/20 0255  HGBA1C 5.3   Urine Drug Screen: No results for input(s): LABOPIA, COCAINSCRNUR, LABBENZ, AMPHETMU, THCU, LABBARB in the last 168 hours.  Alcohol Level No results for input(s): ETH in the last 168 hours.  IMAGING past 24 hours  1. Occlusion left P2/P3 segment likely acute 2. Moderate to severe stenosis left M3 segment supplying the  left frontal parietal lobe. 3. Advanced atherosclerotic disease aortic arch and proximal left subclavian artery 4. Mild atherosclerotic disease in the carotid bifurcation bilaterally. Moderate stenosis in the cavernous carotid bilaterally due to atherosclerotic disease. 5. Moderate to severe stenosis distal left vertebral artery. 6. These results were called by telephone at the time of interpretation on 07/11/2020 at 12:49 pm to provider [facility] , who verbally acknowledged these results  CXray 1.  Cardiomegaly.  Mild pulmonary venous congestion. 2. Mild bilateral interstitial prominence. Mild interstitial edema and/or pneumonitis can not be excluded. Tiny left pleural effusion cannot excluded.  PHYSICAL EXAM General awake alert in no distress HEENT: Normocephalic/atraumatic Lungs: Scattered rales Cardiovascular: Irregularly irregular Extremities warm well perfused Neurological exam She is awake, alert she has mild expressive aphasia with word finding difficulty and word hesitancy.  She can follow simple one to two-step commands.  She is able to name and repeat. Cranial nerves: Pupils equal round reactive light, left gaze preference, is able to come to midline but not look to the right consistently, does not blink to threat from the right,  mild right lower facial asymmetry. Motor exam: Right upper extremity weakness but able to lift off the bed., 3/5 right lower extremity.  Left upper 4/5.  Left lower 4/5. Sensory exam: appears intact Coordination improving  ASSESSMENT/PLAN  Sheryl Suarez is a 87 y.o. female past medical of atrial fibrillation not on anticoagulation, CHF, coronary artery disease, hypertension, history of breast cancer now in remission, at some point on hospice but taken off of hospice because of improvement after medication adjustment, brought into the emergency room for evaluation of sudden onset of right-sided weakness and inability to talk. tPA was administered.    Left hemispheric embolic infarct on atrial fibrillation and is not on long-term anticoagulation due to history of frequent falls   CT head no acute abnormality   CTA head & neck: Occlusion left P2/P3 segment likely acute,  Moderate to severe stenosis left M3 segment supplying the left frontal parietal lobe, Advanced atherosclerotic disease aortic arch and proximal left subclavian artery, Mild atherosclerotic disease in the carotid bifurcation bilaterally. Moderate stenosis in the cavernous carotid bilaterally due to atherosclerotic disease, Moderate to severe stenosis distal left vertebral artery.   MRI acute left MCA infarct involving posterior insula and parietal operculum.  No hemorrhage  2D Echo EF 50%, Left atrial size was severely dilated, Right atrial size was mildly dilated, mild pulm HTN, No shunt, thrombus or wall motion abnormality  LDL 74  HgbA1c 5.3  VTE prophylaxis - SCDs, lovenox    Diet   DIET DYS 3 Room service appropriate? Yes with Assist; Fluid consistency: Nectar Thick  Consider Eliquis start on 5th day post event (5/26) On ASA 81mg  for the present   Therapy recommendations:  SNF vs. ALF (if home ALF can provide care needed)  Disposition: TBD  Chronic atrial fibrillation  Rate controlled on cardizem  Patient & son agreeable to Eliquis as above  Metoprolol 5mg  given for RVR this am  Eliquis started (5/26)  Sepsis  SIRS criteria in this patient includes: Leukocytosis, fever, tachycardia  Lactate 2.9->3.1  Procalcitonin 38.2  Sepsis protocol  UA (5/26): LE +, WBC >50  Urine (5/25) pending  Blood culture (5/26) pending  Cefepime (5/26)   Switched to [place] team on board   Hypertension  Initially required cleviprex infusion   Diltiazem 120mg  daily  home lasix on hold for the present  Hydralazine prn . Keep systolic BP less than 834 . Long-term BP goal normotensive  Hyperlipidemia  Lipitor 10mg    LDL 74, not quite  at goal < 70  High intensity statin not warranted as essentially at goal   Continue statin at discharge  Other Stroke Risk Factors  Advanced Age >/= 6    Coronary artery disease  Other active problems  Urinary retension: foley placed by urology (5/25)  Nausea/vomiting improved  Sheryl Suarez, ACNP-BC Stroke NP 07/16/2020 1545 I have personally obtained history,examined this patient, reviewed notes, independently viewed imaging studies, participated in medical decision making and plan of care.ROS completed by me personally and pertinent positives fully documented  I have made any additions or clarifications directly to the above note. Agree with note above.  Patient has been febrile with elevated white count and urine suggesting infection.  She has sepsis and medical hospitalist team has been consulted and appreciate their help.  Continue antibiotics pending urine culture.  Greater than 50% time during this 35-minute visit was spent in counseling and coordination of care and discussion with care team.  Discussed with Dr. Anderson Malta  Lou Miner, MD Medical Director [facility] Pager: [phone] 07/16/2020 5:02 PM

## 2020-07-16 NOTE — Consult Note (Signed)
Medical Consultation   Sheryl Suarez  VPX:[id number]  DOB: [birth year]  DOA: 07/11/2020  PCP: Leonel Ramsay, MD   Outpatient Specialists: Palliative care; [place] - cardiology   Requesting physician: Krikpatrick/Sethi - neurology  Reason for consultation: Here with CVA.  Having urinary issues, especially retention.  Significant foul-smelling sediment.  Urine culture sent.  Febrile, leukocytosis.  Given Rocephin, IVF, blood cultures.   History of Present Illness: Sheryl Suarez is an 87 y.o. female with h/o HTN; CAD; chronic systolic CHF; breast cancer; and afib who presented with CVA on 5/21.  The patient has mild dysarthria and word finding difficulties.  She reports feeling somewhat better today but cannot further describe why.  She is oriented to person and place.  She denies specific issues but wonders "why she is still here" (alive).  She was previously living in [place] - she will have to be able to do most things on her own.  If not, they would prefer rehab placement - maybe [facility] in [place] (she was there about a year ago for rehab).      Review of Systems:  ROS As per HPI otherwise 10 point review of systems negative. Limited by her speech difficulties, some ?cognitive impairment.   Past Medical History: Past Medical History:  Diagnosis Date  . Arrhythmia    atrial fibrillation  . Breast cancer ([place])    remission  . CHF (congestive heart failure) ([place])   . Coronary artery disease   . Hypertension   . Stroke [facility])     Past Surgical History: Past Surgical History:  Procedure Laterality Date  . ABDOMINAL HYSTERECTOMY    . APPENDECTOMY    . BREAST IMPLANT EXCHANGE    . CHOLECYSTECTOMY    . ESOPHAGOGASTRODUODENOSCOPY (EGD) WITH PROPOFOL N/A 08/20/2015   Procedure: ESOPHAGOGASTRODUODENOSCOPY (EGD) WITH PROPOFOL;  Surgeon: Lollie Sails, MD;  Location: [facility] ENDOSCOPY;  Service: Endoscopy;  Laterality: N/A;  . MASTECTOMY Bilateral    . ORIF ELBOW FRACTURE Right 01/16/2019   Procedure: OPEN REDUCTION INTERNAL FIXATION (ORIF) ELBOW/OLECRANON FRACTURE;  Surgeon: Earnestine Leys, MD;  Location: [facility] ORS;  Service: Orthopedics;  Laterality: Right;     Allergies:  No Known Allergies   Social History:  reports that she has never smoked. She has never used smokeless tobacco. She reports that she does not drink alcohol and does not use drugs.   Family History: Family History  Problem Relation Age of Onset  . CAD Mother   . CAD Father       Physical Exam: Vitals:   07/15/20 2338 07/16/20 0340 07/16/20 0719 07/16/20 1100  BP: (!) 108/52 (!) 96/55 125/65   Pulse: 77 82 (!) 115   Resp: 17 19 (!) 24   Temp: 98.1 F (36.7 C) 98.1 F (36.7 C) (!) 101.8 F (38.8 C) 99.6 F (37.6 C)  TempSrc: Axillary Axillary  Axillary  SpO2: 94% 96% 91%   Weight:      Height:        Constitutional: Alert and awake, oriented x2, not in any acute distress. Eyes: EOMI, irises appear normal, anicteric sclera ENMT: external ears and nose appear normal, normal hearing, Lips appear normal, oropharynx mucosa, tongue normal  Neck: neck appears normal, no masses, normal ROM CVS: S1-S2 clear, no murmur rubs or gallops, no LE edema, normal pedal pulses  Respiratory:  clear to auscultation bilaterally, no wheezing, rales or rhonchi.  Respiratory effort normal.  Abdomen: soft nontender, nondistended Musculoskeletal: : no cyanosis, clubbing or edema noted bilaterally Psych: judgement and insight appear normal, stable mood and affect, mental status Skin: no rashes or lesions or ulcers, no induration or nodules    Data reviewed:  I have personally reviewed the recent labs and imaging studies  Pertinent Labs:   GFR 53 Normal CBC; 17.9 on 5/25   Inpatient Medications:   Scheduled Meds: . apixaban  2.5 mg Oral BID  . aspirin EC  81 mg Oral Daily  . atorvastatin  10 mg Oral Daily  . Chlorhexidine Gluconate Cloth  6 each Topical Q0600   . diltiazem  120 mg Oral Daily  . mouth rinse  15 mL Mouth Rinse BID  . mirtazapine  15 mg Oral QHS   Continuous Infusions: . [START ON 07/17/2020] ceFEPime (MAXIPIME) IV    . ceFEPime (MAXIPIME) IV       Radiological Exams on Admission: DG Chest Port 1 View  Result Date: 07/16/2020 CLINICAL DATA:  Fever. EXAM: PORTABLE CHEST 1 VIEW COMPARISON:  04/23/2019. FINDINGS: Mediastinum and hilar structures normal. Cardiomegaly. Mild pulmonary venous congestion. Mild bilateral interstitial prominence noted. Tiny left pleural effusion. No pneumothorax. No acute bony abnormality. IMPRESSION: 1.  Cardiomegaly.  Mild pulmonary venous congestion. 2. Mild bilateral interstitial prominence. Mild interstitial edema and/or pneumonitis can not be excluded. Tiny left pleural effusion cannot excluded. Electronically Signed   By: Marcello Moores  Register   On: 07/16/2020 08:46    Impression/Recommendations Principal Problem:   Acute ischemic stroke [facility]) Active Problems:   HTN (hypertension), benign   Atrial fibrillation, chronic ([place])   Sepsis due to undetermined organism ([place])   Goals of care, counseling/discussion   DNR (do not resuscitate)  CVA -Patient presented with acute L MCA infarct on 5/22 -Neurology is managing -Speech has recommended dysphagia 3 nectar thick diet, indicating that she is at increased aspiration risk -PT/OT have recommended SNF -TOC team is consulting to arrange for SNF rehab  Sepsis -SIRS criteria in this patient includes: Leukocytosis, fever, tachycardia  -Patient has no current evidence of acute organ failure - has normal BP but lactate has not been checked  -While awaiting blood cultures, this may be a preseptic condition. -Sepsis protocol initiated -Suspected source is UTI based on appearance of urine, but UA has not yet been done; she is also at aspiration risk but does not have apparent current respiratory symptoms -Treat with IV Cefepime for presumed hospital-associated  urinary source -Will trend lactate -Will order procalcitonin level.  As the procalcitonin level normalizes, it will be reasonable to consider de-escalation of antibiotic coverage.  HTN -Continue Cardizem   HLD -Started on Lipitor 10 mg daily   Afib -Rate controlled with Cardizem at home -Started on Eliquis  Chronic systolic CHF -EF 96% with indeterminate diastolic function on 2/95 echo -Appears compensated at this time  Remote breast cancer  -s/p B mastectomy -No current concerns  Goals of care -Called to discuss [place] with patient's daughter -She reports that they are hoping patient will improve enough to return to ALF -If not, they look forward to short-term SNF rehab (hopefully at [facility]) followed by return to ALF -She is DNR -It does not appear that family is considering transition to comfort measures primarily or only at this time    Note: This patient has been tested and is negative for the novel coronavirus COVID-19.    Thank you for this consultation.  Our [facility] hospitalist team will follow  the patient with you.  If she has further decompensation, palliative care consultation is likely reasonable.   Time Spent: 50 minutes  Karmen Bongo M.D. [facility] 07/16/2020, 11:19 AM

## 2020-07-16 NOTE — Progress Notes (Signed)
Bedside visit -   Patient with an episode of borderline hypotension.  She was bolused.  Lactate was also more elevated than prior.  Nursing staff noted difficulty manipulating foley - bladder scan with 700 cc but unable to draw more than about 150 cc before the patient because visibly uncomfortable.  I came to evaluate the patient and had a long conversation with her daughter/HCPOA.  Based on our conversation, the patient's clearly expressed goals were for comfort - she has long stated that she does not understand why she has to remain here on [place] any longer.  We discussed ongoing aggressive treatment of sepsis vs. Transition to comfort measures only.  After prolonged discussion, the decision was made to transition to comfort care with ongoing use of antibiotics and IVF for now but otherwise no escalation of care.   Total critical care time: 35 minutes Critical care time was exclusive of separately billable procedures and treating other patients. Critical care was necessary to treat or prevent imminent or life-threatening deterioration. Critical care was time spent personally by me on the following activities: development of treatment plan with patient and/or surrogate as well as nursing, discussions with consultants, evaluation of patient's response to treatment, examination of patient, obtaining history from patient or surrogate, ordering and performing treatments and interventions, ordering and review of laboratory studies, ordering and review of radiographic studies, pulse oximetry and re-evaluation of patient's condition.   Carlyon Shadow, M.D.

## 2020-07-16 NOTE — Progress Notes (Signed)
Occupational Therapy Treatment Patient Details Name: Sheryl Suarez MRN: [id number] DOB: [birth year] Today's Date: 07/16/2020    History of present illness 87 y.o. female who presented 5/21 with R-sided weakness and inability to talk. tPA was administered. MRI of head revealed acute L MCA infarct involving the posterior insula and parietal operculum. PMH: atrial fibrillation not on anticoagulation, CHF, CAD, HTN, and history of breast cancer now in remission.   OT comments  Patient with improved participation and good progress toward patient focused OT goals.  Patient with no complaints, and barriers to independence are listed below.  She was able to move to the edge of the bed with [employer], stand with Min A and transfer to her recliner with [employer].  She was setup to perform light grooming seated, and needed Mod A for lower body dressing.  OT will continue to follow her to improve her functional status, but SNF is recommended given level of assistance needed prior to returning to ALF.    Follow Up Recommendations  SNF    Equipment Recommendations  3 in 1 bedside commode    Recommendations for Other Services      Precautions / Restrictions Precautions Precautions: Fall Restrictions Weight Bearing Restrictions: No       Mobility Bed Mobility Overal bed mobility: Needs Assistance Bed Mobility: Supine to Sit     Supine to sit: Min guard       Patient Response: Cooperative  Transfers Overall transfer level: Needs assistance Equipment used: Rolling walker (2 wheeled) Transfers: Sit to/from [employer] Sit to Stand: Min assist Stand pivot transfers: Min guard            Balance Overall balance assessment: Needs assistance Sitting-balance support: Feet supported Sitting balance-Leahy Scale: Fair     Standing balance support: Bilateral upper extremity supported Standing balance-Leahy Scale: Poor Standing balance comment: Reliant on UE support                            ADL either performed or assessed with clinical judgement   ADL   Eating/Feeding: Set up;Sitting   Grooming: Wash/dry face;Wash/dry hands;Sitting;Set up           Upper Body Dressing : Minimal assistance;Cueing for sequencing;Sitting   Lower Body Dressing: Cueing for sequencing;Sit to/from stand;Moderate assistance               Functional mobility during ADLs: Cueing for safety;Minimal assistance;Rolling walker                         Cognition Arousal/Alertness: Awake/alert Behavior During Therapy: WFL for tasks assessed/performed Overall Cognitive Status: History of cognitive impairments - at baseline                                 General Comments: Memory deficits at baseline. Presenting with difficulty sequencing. Gets frustrated with expressive difficulty.                          Pertinent Vitals/ Pain       Pain Assessment: No/denies pain Pain Intervention(s): Monitored during session  Frequency  Min 2X/week        Progress Toward Goals  OT Goals(current goals can now be found in the care plan section)  Progress towards OT goals: Progressing toward goals  Acute Rehab OT Goals Patient Stated Goal: to go back to [facility] OT Goal Formulation: With patient/family Time For Goal Achievement: 07/28/20 Potential to Achieve Goals: [place] Discharge plan remains appropriate    Co-evaluation                 AM-PAC OT "6 Clicks" Daily Activity     Outcome Measure   Help from another person eating meals?: A Little Help from another person taking care of personal grooming?: A Little Help from another person toileting, which includes using toliet, bedpan, or urinal?: A Lot Help from another person bathing (including washing, rinsing, drying)?: A Lot Help from another person to put on and taking off  regular upper body clothing?: A Little Help from another person to put on and taking off regular lower body clothing?: A Lot 6 Click Score: 15    End of Session Equipment Utilized During Treatment: Rolling walker;Oxygen  OT Visit Diagnosis: Unsteadiness on feet (R26.81);Muscle weakness (generalized) (M62.81);Other symptoms and signs involving cognitive function;Cognitive communication deficit (R41.841)   Activity Tolerance Patient tolerated treatment well   Patient Left in chair;with call bell/phone within reach;with chair alarm set;with family/visitor present   Nurse Communication Mobility status        Time: 8938-1017 OT Time Calculation (min): 23 min  Charges: OT General Charges $OT Visit: 1 Visit OT Treatments $Self Care/Home Management : 23-37 mins  07/16/2020  Rich, OTR/L  Acute Rehabilitation Services  Office:  [place] 07/16/2020, 11:49 AM

## 2020-07-17 DIAGNOSIS — I482 Chronic atrial fibrillation, unspecified: Secondary | ICD-10-CM | POA: Diagnosis not present

## 2020-07-17 DIAGNOSIS — I10 Essential (primary) hypertension: Secondary | ICD-10-CM | POA: Diagnosis not present

## 2020-07-17 DIAGNOSIS — Z515 Encounter for palliative care: Secondary | ICD-10-CM

## 2020-07-17 DIAGNOSIS — Z7189 Other specified counseling: Secondary | ICD-10-CM | POA: Diagnosis not present

## 2020-07-17 DIAGNOSIS — I639 Cerebral infarction, unspecified: Secondary | ICD-10-CM | POA: Diagnosis not present

## 2020-07-17 DIAGNOSIS — A419 Sepsis, unspecified organism: Secondary | ICD-10-CM

## 2020-07-17 DIAGNOSIS — Z66 Do not resuscitate: Secondary | ICD-10-CM | POA: Diagnosis not present

## 2020-07-17 MED ORDER — COVID-19 MRNA VACC (MODERNA) 50 MCG/0.25ML IM SUSP
0.2500 mL | Freq: Once | INTRAMUSCULAR | Status: AC
  Administered 2020-07-17: 0.25 mL via INTRAMUSCULAR
  Filled 2020-07-17: qty 0.25

## 2020-07-17 MED ORDER — CEPHALEXIN 250 MG PO CAPS
250.0000 mg | ORAL_CAPSULE | Freq: Two times a day (BID) | ORAL | Status: AC
  Administered 2020-07-17 – 2020-07-21 (×10): 250 mg via ORAL
  Filled 2020-07-17 (×10): qty 1

## 2020-07-17 NOTE — Progress Notes (Signed)
This chaplain responded to PMT consult for spiritual care.  The Pt. welcomes the chaplain to her bedside. The chaplain listens as the Pt. responds appropriately to the topics the chaplain brings up and interjects her own topics.  The Pt. faith and family is the common thread throughout the time together. The Pt. is thankful God has chosen to keep her alive for 95 years.  The chaplain understands the Pt. Is comfortable with wondering and trusting God for end of life timing.   The Pt. celebrates the memories of weekend camping trips with her family in the three room tent. "The family would return home on Sunday and start planning the next camping trip for Friday."  The Pt. Accepted the chaplain's invitation for prayer and F/U spiritual care.

## 2020-07-17 NOTE — Progress Notes (Signed)
[facility] Liaison RN note  This patient is currently enrolled in [facility] outpatient based palliative care.   Comfort care noted. [facility] will continue to follow.  Please call with any outpatient palliative care questions.  Thank you. Margaretmary Eddy, BSN, RN [facility] Liaison [phone]

## 2020-07-17 NOTE — TOC Progression Note (Addendum)
Transition of Care [facility]) - Progression Note    Patient Details  Name: CAROLE DONER MRN: [id number] Date of Birth: [birth year]  Transition of [facility]) CM/SW [place], LCSW Phone Number: 07/17/2020, 3:11 PM  Clinical Narrative:    3pm-Covering CSW received call from patient's daughter. She stated she went to [facility] and the phone system was down. The lead RN is out but the covering caregiver stated they would not accept patient back there with hospice as she needs too much care; she refused to call CSW back.   Patient's daughter understanding of need for SNF and will pay privately if needed, either under a short Medicare stay with palliative initially or long term care private pay with hospice. She requested facility near [place] if possible with preference for [facility] or [facility].  CSW contacted the following facilities:  -[facility]: Will have female bed available Tuesday for patient. They request patient receive COVID booster. Patient's daughter reported agreement with her receiving it.   -[facility]: Left voicemail. -[facility]: Left message. [facility]: no beds available -[facility]: No beds available   3:37pm-CSW received call from [facility]. She stated that their nurse, Lattie Haw, would have to come to the hospital to assess patient, which they are unable to do until Tuesday. She asked CSW if that is what they should do and CSW said yes and please call CSW before they come to make sure an alternate SNF plan had not been found. CSW sent MD message to see if patient can receive COVID vaccine booster here.      Expected Discharge Plan: [place] Barriers to Discharge: Continued Medical Work up  Expected Discharge Plan and Services Expected Discharge Plan: [place]: [place] arrangements for the past 2 months: [place]                                        Social Determinants of Health (SDOH) Interventions    Readmission Risk Interventions No flowsheet data found.

## 2020-07-17 NOTE — Progress Notes (Signed)
STROKE TEAM PROGRESS NOTE   INTERVAL HISTORY No acute events. WBC down to 8.1 yesterday. Afebrile. She was started on Unasyn and foley cath was placed by urology for Sheryl Suarez UTI.  She looks little better today and seems more comfortable.  She has a Foley catheter and urine looks clear urine cultures growing gram-negative rods and she has been started on antibiotics.  She is afebrile.  She denies complaints today. Palliative care onboard and following for discharge planning. Family not considering comfort care at this time. They desire for patient to return to ALF if possible.    Vitals:   07/17/20 0151 07/17/20 0155 07/17/20 0420 07/17/20 0745  BP:   (!) 95/43 (!) 122/57  Pulse: 94 92 90 84  Resp: 19 16 17 20   Temp:   (!) 97.5 F (36.4 C) 97.8 F (36.6 C)  TempSrc:   Oral Oral  SpO2: 94% 95% 94% 100%  Weight:      Height:       CBC:  Recent Labs  Lab 07/11/20 1204 07/11/20 1214 07/15/20 1502 07/16/20 0833  WBC 6.0  --  17.9* 8.1  NEUTROABS 3.7  --   --   --   HGB 13.7   < > 12.6 13.2  HCT 44.2   < > 39.1 40.9  MCV 94.0  --  91.8 91.7  PLT 223  --  172 167   < > = values in this interval not displayed.   Basic Metabolic Panel:  Recent Labs  Lab 07/15/20 1502 07/16/20 0833  NA 136 137  K 4.0 3.5  CL 103 105  CO2 22 24  GLUCOSE 147* 93  BUN 17 21  CREATININE 1.03* 0.98  CALCIUM 8.9 9.1   Lipid Panel:  Recent Labs  Lab 07/12/20 0255  CHOL 128  TRIG 58  HDL 42  CHOLHDL 3.0  VLDL 12  LDLCALC 74   HgbA1c:  Recent Labs  Lab 07/12/20 0255  HGBA1C 5.3   Urine Drug Screen: No results for input(s): LABOPIA, COCAINSCRNUR, LABBENZ, AMPHETMU, THCU, LABBARB in the last 168 hours.  Alcohol Level No results for input(s): ETH in the last 168 hours.  IMAGING past 24 hours  1. Occlusion left P2/P3 segment likely acute 2. Moderate to severe stenosis left M3 segment supplying the left frontal parietal lobe. 3. Advanced atherosclerotic disease aortic arch and  proximal left subclavian artery 4. Mild atherosclerotic disease in the carotid bifurcation bilaterally. Moderate stenosis in the cavernous carotid bilaterally due to atherosclerotic disease. 5. Moderate to severe stenosis distal left vertebral artery. 6. These results were called by telephone at the time of interpretation on 07/11/2020 at 12:49 pm to provider Sheryl Suarez , who verbally acknowledged these results  CXray 1.  Cardiomegaly.  Mild pulmonary venous congestion. 2. Mild bilateral interstitial prominence. Mild interstitial edema and/or pneumonitis can not be excluded. Tiny left pleural effusion cannot excluded.  PHYSICAL EXAM General awake alert in no distress HEENT: Normocephalic/atraumatic Lungs: Scattered rales Cardiovascular: Irregularly irregular Extremities warm well perfused Neurological exam She is awake, alert she has mild expressive aphasia with word finding difficulty and word hesitancy.  She can follow simple one to two-step commands.  She is able to name and repeat. Cranial nerves: Pupils equal round reactive light, left gaze preference, is able to come to midline but not look to the right consistently, does not blink to threat from the right, mild right lower facial asymmetry. Motor exam: Right upper extremity weakness but able to lift  off the bed., 3/5 right lower extremity.  Left upper 4/5.  Left lower 4/5. Sensory exam: appears intact Coordination improving  ASSESSMENT/PLAN  Sheryl Suarez is a 87 y.o. female past medical of atrial fibrillation not on anticoagulation, CHF, coronary artery disease, hypertension, history of breast cancer now in remission, at some point on hospice but taken off of hospice because of improvement after medication adjustment, brought into the emergency room for evaluation of sudden onset of right-sided weakness and inability to talk. tPA was administered.   Left hemispheric embolic infarct on atrial fibrillation and is not on  long-term anticoagulation due to history of frequent falls   CT head no acute abnormality   CTA head & neck: Occlusion left P2/P3 segment likely acute,  Moderate to severe stenosis left M3 segment supplying the left frontal parietal lobe, Advanced atherosclerotic disease aortic arch and proximal left subclavian artery, Mild atherosclerotic disease in the carotid bifurcation bilaterally. Moderate stenosis in the cavernous carotid bilaterally due to atherosclerotic disease, Moderate to severe stenosis distal left vertebral artery.   MRI acute left MCA infarct involving posterior insula and parietal operculum.  No hemorrhage  2D Echo EF 50%, Left atrial size was severely dilated, Right atrial size was mildly dilated, mild pulm HTN, No shunt, thrombus or wall motion abnormality  LDL 74  HgbA1c 5.3  VTE prophylaxis - SCDs, lovenox    Diet   Diet regular Room service appropriate? Yes; Fluid consistency: Thin  Consider Eliquis start on 5th day post event (5/26) On ASA 81mg  for the present   Therapy recommendations:  SNF vs. ALF (if home ALF can provide care needed)  Disposition: TBD  Chronic atrial fibrillation  Rate controlled on cardizem  Patient & son agreeable to Eliquis as above  Metoprolol 5mg  given for RVR this am  Eliquis started (5/26)  Sepsis/Sheryl-Suarez UTI  SIRS criteria in this patient includes: Leukocytosis, fever, tachycardia  Lactate 2.9->3.1  Procalcitonin 38.2  Sepsis protocol  UA (5/26): LE shows Sheryl Suarez   Blood culture (5/26) negative 5/27.   Unasyn (5/26) per hospitalist team consult.   Switched to Unasyn, may de-escalate per   Medical team on board   Hypertension  Initially required cleviprex infusion   Diltiazem 120mg  daily  home lasix on hold for the present  Hydralazine prn . Keep systolic BP less than 956 . Long-term BP goal normotensive  Hyperlipidemia  Lipitor 10mg    LDL 74, not quite at goal < 70  High intensity statin not  warranted as essentially at goal   Continue statin at discharge  Other Stroke Risk Factors  Advanced Age >/= 81    Coronary artery disease  Other active problems  Urinary retension: foley placed by urology (5/25). [facility] contacted for Suarez follow up for foley removal in about 5-7 days.  Pager: [phone] 07/17/2020 1:31 PM I have personally obtained history,examined this patient, reviewed notes, independently viewed imaging studies, participated in medical decision making and plan of care.ROS completed by me personally and pertinent positives fully documented  I have made any additions or clarifications directly to the above note. Agree with note above.  Continue Unasyn follow-up UTI and sepsis and appreciate help from medical hospitalist team.  Palliative care team on board to decide on family's wishes and hopefully arrange transfer back to ALF soon.  Medically stable for discharge to ALF with bed available.  Discussed with palliative care nurse practitioner.  Greater than 50% time during this 20-minute visit was spent in counseling and  coordination of care and discussion with care team.  Antony Contras, MD Medical Director [place] Pager: [phone] 07/17/2020 2:16 PM

## 2020-07-17 NOTE — Consult Note (Addendum)
Palliative Medicine Inpatient Consult Note  Reason for consult:  End of Life Care  HPI:  Per intake H&P --> Sheryl Suarez is an 87 y.o. female with h/o HTN; CAD; chronic systolic CHF; breast cancer; and afib who presented with CVA on 5/21.  The patient has mild dysarthria and word finding difficulties.    She suffered from severe sepsis in the setting of a UTI.  She had been made comfort oriented care by the hospitalist team in the setting of these events with the continuation of antibiotics and IV fluids.  Palliative care has been asked to get involved to further address end-of-life care and disposition needs.  Clinical Assessment/Goals of Care:  *Please note that this is a verbal dictation therefore any spelling or grammatical errors are due to the "[place] One" system interpretation.  I have reviewed medical records including EPIC notes, labs and imaging, received report from bedside RN, assessed the patient who was sitting in bed alert and oriented to self.    I met with patient's daughter, Sheryl Suarez to further discuss diagnosis prognosis, [place], EOL wishes, disposition and options.   I introduced Palliative Medicine as specialized medical care for people living with serious illness. It focuses on providing relief from the symptoms and stress of a serious illness. The goal is to improve quality of life for both the patient and the family.  Sheryl Suarez is from [place], [place].  She is widowed.  She has 2 sons and 1 daughter.  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  She is a woman who loves cooking and crochet.  She was quite outdoorsy and would often go camping, fishing, and waterskiing with her family.  She was extremely involved in her church and is of the [facility] denomination.  Prior to hospitalization Sheryl Suarez had lived at [facility].  She was able to mobilize with a front wheel walker  down to the dining room and self-feed.  She did require help with other basic activities of daily living.  A detailed discussion was had today regarding advanced directives -patient does have healthcare power of attorney forms which have been completed.    Concepts specific to code status, artifical feeding and hydration, continued IV antibiotics and rehospitalization was had.    MOST form was completed as below:  Cardiopulmonary Resuscitation: Do Not Attempt Resuscitation (DNR/No CPR)  Medical Interventions: Comfort Measures: Keep clean, warm, and dry. Use medication by any route, positioning, wound care, and other measures to relieve pain and suffering. Use oxygen, suction and manual treatment of airway obstruction as needed for comfort. Do not transfer to the hospital unless comfort needs cannot be met in current location.  Antibiotics: Determine use of limitation of antibiotics when infection occurs  IV Fluids: IV fluids for a defined trial period  Feeding Tube: No feeding tube   The difference between a aggressive medical intervention path  and a palliative comfort care path for this patient at this time was had.  We reviewed that Sheryl Suarez may be best served going home on hospice as she herself had described that her time on earth is limited.  I described hospice as a service for patients for have a life expectancy of < 6 months. It preserves dignity and quality at the end phases of life. The focus changes from curative to symptom relief.  Sheryl Suarez shares with me that in the past Sheryl Suarez had been on hospice  and had thrived and she would be open to her going back to [facility] assisted living on hospice care.  Discussed the importance of continued conversation with family and their  medical providers regarding overall plan of care and treatment options, ensuring decisions are within the context of the patients values and GOCs.  Decision Maker: Sheryl Suarez (daughter) [phone]  SUMMARY OF  RECOMMENDATIONS   DNAR/DNI  MOST Completed, paper copy placed onto the chart electric copy can be found in [facility]  DNR Form Completed, paper copy placed onto the chart electric copy can be found in [facility]  Comfort focused care -to new antibiotics and trial of IV fluids  Comfort meds per [facility]  Appreciate transitions of care team verifying whether or not [facility] could accept [facility] under hospice care  Going incremental palliative care support  Code Status/Advance Care Planning: DNAR/DNI   Palliative Prophylaxis:   Oral care, mobility, delirium precaution  Additional Recommendations (Limitations, Scope, Preferences):  Comfort care   Psycho-social/Spiritual:   Desire for further Chaplaincy support:  Yes-[employer]  Additional Recommendations:  Education on end-of-life care   Prognosis:  Limited -hospice is appropriate  Discharge Planning:  Discharge to [facility] with [facility] hospice   Vitals:   07/17/20 0420 07/17/20 0745  BP: (!) 95/43 (!) 122/57  Pulse: 90 84  Resp: 17 20  Temp: (!) 97.5 F (36.4 C) 97.8 F (36.6 C)  SpO2: 94% 100%    Intake/Output Summary (Last 24 hours) at 07/17/2020 1022 Last data filed at 07/17/2020 0900 Gross per 24 hour  Intake 460 ml  Output 550 ml  Net -90 ml   Last Weight  Most recent update: 07/11/2020  1:10 PM   Weight  54.8 kg (120 lb 13 oz)           Gen: Elderly Caucasian female in no acute distress HEENT: moist mucous membranes CV: Regular rate and rhythm PULM: On 2 L/min ABD: soft/nontender EXT: No edema Neuro: Alert and oriented to person  PPS: 30%   This conversation/these recommendations were discussed with patient primary care team, Dr. Leonie Man  Time In: 1100 Time Out: 1210 Total Time: 70 Greater than 50%  of this time was spent counseling and coordinating care related to the above assessment and plan.  [facility] Team Team Cell Phone: [phone] Please utilize secure  chat with additional questions, if there is no response within 30 minutes please call the above phone number  Palliative Medicine Team providers are available by phone from 7am to 7pm daily and can be reached through the team cell phone.  Should this patient require assistance outside of these hours, please call the patient's attending physician.

## 2020-07-17 NOTE — Progress Notes (Addendum)
PROGRESS NOTE    Sheryl Suarez  WYO:[id number] DOB: 10-03-1923 DOA: 07/11/2020 PCP: Leonel Ramsay, MD   Brief Narrative:  87 year old female with history of hypertension, chronic systolic heart failure, breast cancer, CAD, atrial fibrillation presented with acute CVA on 5/21.  Admitted by neurology service.  TRH was consulted for sepsis.  Patient appears to have UTI and currently being treated with cefepime.  Seems the patient was also transition to comfort measures however family would like to continue IV fluids and antibiotics.  Palliative care consulted and pending. Assessment & Plan   Acute CVA -Managed by neurology -Likely embolic given her history of atrial fibrillation and was not on long-term anticoagulation due to history of frequent falls -Noted to have an acute left MCA infarct on 5/22 -PT and OT have recommended SNF -Speech therapy working with patient and recommended dysphagia 3 nectar thick diet -was on aspirin and Eliquis, statin- however being transitioned to comfort   Sepsis likely secondary to UTI -Patient noted to have leukocytosis, fever and tachycardia -UA showed few bacteria, >50 WBC, negative nitrites, large leukocytes -Urine culture shows >100K E. Coli -Blood cultures show no growth to date -Currently on Unasyn??? But also received a dose of ceftriaxone -would recommend keflex 250 BID x 5 days  Essential hypertension -Initially required Cleviprex infusion -was on diltiazem  Hyperlipidemia -was on statin  Atrial fibrillation -Currently rate controlled -Eliquis and cardizem discontinued due to comfort measures  Chronic systolic heart failure -Currently appears to be compensated.  Patient does have lower extremity edema however no shortness of breath -Home Lasix currently held -Monitor intake and output and weights  History of breast cancer -Status post bilateral vasectomy  Urinary retention -Foley catheter placed by urology on 5/25  Goals of  care -Previous hospitalist discussed goals of care with patient's daughter and feel they would like for her to be comfort care however would like to continue IV fluids as well as antibiotics -Palliative care consulted and appreciated -Currently DNR  DVT Prophylaxis Comfort care  Code Status: DNR  Family Communication: None at bedside  Disposition Plan:  Status is: Inpatient  Remains inpatient appropriate because:IV treatments appropriate due to intensity of illness or inability to take PO and Inpatient level of care appropriate due to severity of illness   Dispo: The patient is from: ALF              Anticipated d/c is to: TBD              Patient currently is not medically stable to d/c.   Difficult to place patient No   Consultants [facility] Urology Palliative care  Procedures  Echocardiogram  Antibiotics   Anti-infectives (From admission, onward)   Start     Dose/Rate Route Frequency Ordered Stop   07/17/20 1200  ceFEPIme (MAXIPIME) 1 g in sodium chloride 0.9 % 100 mL IVPB  Status:  Discontinued        1 g 200 mL/hr over 30 Minutes Intravenous Every 24 hours 07/16/20 1111 07/16/20 1137   07/16/20 1200  ceFEPIme (MAXIPIME) 2 g in sodium chloride 0.9 % 100 mL IVPB  Status:  Discontinued        2 g 200 mL/hr over 30 Minutes Intravenous  Once 07/16/20 1106 07/16/20 1137   07/16/20 1200  Ampicillin-Sulbactam (UNASYN) 3 g in sodium chloride 0.9 % 100 mL IVPB        3 g 200 mL/hr over 30 Minutes Intravenous Every 12 hours 07/16/20 1137  07/16/20 0900  cefTRIAXone (ROCEPHIN) 1 g in sodium chloride 0.9 % 100 mL IVPB        1 g 200 mL/hr over 30 Minutes Intravenous  Once 07/16/20 2409 07/16/20 7353      Subjective:   Sheryl Suarez seen and examined today.  Patient confused this morning.  Has no complaints and wonders why she still here.  Objective:   Vitals:   07/17/20 0151 07/17/20 0155 07/17/20 0420 07/17/20 0745  BP:   (!) 95/43 (!) 122/57  Pulse: 94 92 90 84  Resp:  19 16 17 20   Temp:   (!) 97.5 F (36.4 C) 97.8 F (36.6 C)  TempSrc:   Oral Oral  SpO2: 94% 95% 94% 100%  Weight:      Height:        Intake/Output Summary (Last 24 hours) at 07/17/2020 1048 Last data filed at 07/17/2020 0900 Gross per 24 hour  Intake 460 ml  Output 550 ml  Net -90 ml   Filed Weights   07/11/20 1200 07/11/20 1230  Weight: 54.8 kg 54.8 kg    Exam  General: Well developed, elderly, chronically ill-appearing, NAD  HEENT: NCAT, mucous membranes moist.   Cardiovascular: S1 S2 auscultated, irregular  Respiratory: Diminished however clear  Abdomen: Soft, nontender, nondistended, + bowel sounds  Extremities: warm dry without cyanosis clubbing.  Lower extremity edema  Neuro: AAOx 1 (self only), confused this morning.  Does not know where she is or why she is here.  Able to follow some commands.  Psych: pleasantly confused   Data Reviewed: I have personally reviewed following labs and imaging studies  CBC: Recent Labs  Lab 07/11/20 1204 07/11/20 1214 07/15/20 1502 07/16/20 0833  WBC 6.0  --  17.9* 8.1  NEUTROABS 3.7  --   --   --   HGB 13.7 14.3 12.6 13.2  HCT 44.2 42.0 39.1 40.9  MCV 94.0  --  91.8 91.7  PLT 223  --  172 299   Basic Metabolic Panel: Recent Labs  Lab 07/11/20 1204 07/11/20 1214 07/15/20 1502 07/16/20 0833  NA 139 140 136 137  K 3.9 4.1 4.0 3.5  CL 105 105 103 105  CO2 26  --  22 24  GLUCOSE 98 96 147* 93  BUN 24* 31* 17 21  CREATININE 1.00 0.80 1.03* 0.98  CALCIUM 9.5  --  8.9 9.1   GFR: Estimated Creatinine Clearance: 27.2 mL/min (by C-G formula based on SCr of 0.98 mg/dL). Liver Function Tests: Recent Labs  Lab 07/11/20 1204  AST 15  ALT 9  ALKPHOS 106  BILITOT 0.9  PROT 7.0  ALBUMIN 3.2*   No results for input(s): LIPASE, AMYLASE in the last 168 hours. No results for input(s): AMMONIA in the last 168 hours. Coagulation Profile: Recent Labs  Lab 07/11/20 1204 07/16/20 1123  INR 1.1 1.3*   Cardiac  Enzymes: No results for input(s): CKTOTAL, CKMB, CKMBINDEX, TROPONINI in the last 168 hours. BNP (last 3 results) No results for input(s): PROBNP in the last 8760 hours. HbA1C: No results for input(s): HGBA1C in the last 72 hours. CBG: Recent Labs  Lab 07/11/20 1205 07/15/20 1140  GLUCAP 85 154*   Lipid Profile: No results for input(s): CHOL, HDL, LDLCALC, TRIG, CHOLHDL, LDLDIRECT in the last 72 hours. Thyroid Function Tests: No results for input(s): TSH, T4TOTAL, FREET4, T3FREE, THYROIDAB in the last 72 hours. Anemia Panel: No results for input(s): VITAMINB12, FOLATE, FERRITIN, TIBC, IRON, RETICCTPCT in the last 72  hours. Urine analysis:    Component Value Date/Time   COLORURINE AMBER (A) 07/16/2020 1144   APPEARANCEUR HAZY (A) 07/16/2020 1144   LABSPEC 1.014 07/16/2020 1144   PHURINE 6.0 07/16/2020 1144   GLUCOSEU NEGATIVE 07/16/2020 1144   HGBUR LARGE (A) 07/16/2020 1144   BILIRUBINUR NEGATIVE 07/16/2020 [place] 07/16/2020 1144   PROTEINUR 100 (A) 07/16/2020 1144   NITRITE NEGATIVE 07/16/2020 1144   LEUKOCYTESUR LARGE (A) 07/16/2020 1144   Sepsis Labs: @LABRCNTIP (procalcitonin:4,lacticidven:4)  ) Recent Results (from the past 240 hour(s))  SARS CORONAVIRUS 2 (TAT 6-24 HRS) Nasopharyngeal Nasopharyngeal Swab     Status: None   Collection Time: 07/11/20  5:53 PM   Specimen: Nasopharyngeal Swab  Result Value Ref Range Status   SARS Coronavirus 2 NEGATIVE NEGATIVE Final    Comment: (NOTE) SARS-CoV-2 target nucleic acids are NOT DETECTED.  The SARS-CoV-2 RNA is generally detectable in upper and lower respiratory specimens during the acute phase of infection. Negative results do not preclude SARS-CoV-2 infection, do not rule out co-infections with other pathogens, and should not be used as the sole basis for treatment or other patient management decisions. Negative results must be combined with clinical observations, patient history, and  epidemiological information. The expected result is Negative.  Fact Sheet for Patients: SugarRoll.be  Fact Sheet for Healthcare Providers: https://www.woods-mathews.com/  This test is not yet approved or cleared by the [place] FDA and  has been authorized for detection and/or diagnosis of SARS-CoV-2 by FDA under an Emergency Use Authorization (EUA). This EUA will remain  in effect (meaning this test can be used) for the duration of the COVID-19 declaration under Se ction 564(b)(1) of the Act, 21 U.S.C. section 360bbb-3(b)(1), unless the authorization is terminated or revoked sooner.  Performed at [facility], [place] [address], [place], [place] [address]   MRSA PCR Screening     Status: None   Collection Time: 07/11/20  6:22 PM   Specimen: Nasal Mucosa; Nasopharyngeal  Result Value Ref Range Status   MRSA by PCR NEGATIVE NEGATIVE Final    Comment:        The GeneXpert MRSA Assay (FDA approved for NASAL specimens only), is one component of a comprehensive MRSA colonization surveillance program. It is not intended to diagnose MRSA infection nor to guide or monitor treatment for MRSA infections. Performed at [facility], [place] [address], [name], [place] [address]   Culture, Urine     Status: Abnormal (Preliminary result)   Collection Time: 07/15/20  7:04 PM   Specimen: Urine, Catheterized  Result Value Ref Range Status   Specimen Description URINE, CATHETERIZED  Final   Special Requests   Final    NONE Performed at [facility], [place] [address], [place], [place] [address]    Culture >=100,000 COLONIES/mL ESCHERICHIA COLI (A)  Final   Report Status PENDING  Incomplete  Culture, blood (routine x 2)     Status: None (Preliminary result)   Collection Time: 07/16/20  8:33 AM   Specimen: BLOOD LEFT HAND  Result Value Ref Range Status   Specimen Description BLOOD LEFT HAND  Final   Special Requests   Final     BOTTLES DRAWN AEROBIC AND ANAEROBIC Blood Culture adequate volume   Culture   Final    NO GROWTH < 24 HOURS Performed at [facility], [place] [address], [place], [place] [address]    Report Status PENDING  Incomplete  Culture, blood (routine  x 2)     Status: None (Preliminary result)   Collection Time: 07/16/20  8:39 AM   Specimen: BLOOD RIGHT WRIST  Result Value Ref Range Status   Specimen Description BLOOD RIGHT WRIST  Final   Special Requests   Final    BOTTLES DRAWN AEROBIC AND ANAEROBIC Blood Culture adequate volume   Culture   Final    NO GROWTH < 24 HOURS Performed at [facility], [address], [place], [place] [address]    Report Status PENDING  Incomplete      Radiology Studies: DG Chest Port 1 View  Result Date: 07/16/2020 CLINICAL DATA:  Fever. EXAM: PORTABLE CHEST 1 VIEW COMPARISON:  04/23/2019. FINDINGS: Mediastinum and hilar structures normal. Cardiomegaly. Mild pulmonary venous congestion. Mild bilateral interstitial prominence noted. Tiny left pleural effusion. No pneumothorax. No acute bony abnormality. IMPRESSION: 1.  Cardiomegaly.  Mild pulmonary venous congestion. 2. Mild bilateral interstitial prominence. Mild interstitial edema and/or pneumonitis can not be excluded. Tiny left pleural effusion cannot excluded. Electronically Signed   By: Marcello Moores  Register   On: 07/16/2020 08:46     Scheduled Meds: . mouth rinse  15 mL Mouth Rinse BID  . mirtazapine  15 mg Oral QHS   Continuous Infusions: . ampicillin-sulbactam (UNASYN) IV 3 g (07/17/20 0037)  . lactated ringers    . lactated ringers       LOS: 6 days   Time Spent in minutes   45 minutes  Perpetua Elling D.O. on 07/17/2020 at 10:48 AM  Between 7am to 7pm - Please see pager noted on [email or web address]  After 7pm go to [email or web address]  And look for the night coverage person covering for me after hours  [facility] Office  [phone]

## 2020-07-17 NOTE — Progress Notes (Signed)
Patient moved to 6N16 in stable condition. Report given to [facility]

## 2020-07-18 DIAGNOSIS — I63412 Cerebral infarction due to embolism of left middle cerebral artery: Secondary | ICD-10-CM | POA: Diagnosis not present

## 2020-07-18 DIAGNOSIS — I10 Essential (primary) hypertension: Secondary | ICD-10-CM | POA: Diagnosis not present

## 2020-07-18 DIAGNOSIS — I639 Cerebral infarction, unspecified: Secondary | ICD-10-CM | POA: Diagnosis not present

## 2020-07-18 LAB — URINE CULTURE: Culture: >=100000 — AB

## 2020-07-18 MED ORDER — ASPIRIN EC 81 MG PO TBEC
81.0000 mg | DELAYED_RELEASE_TABLET | Freq: Every day | ORAL | Status: DC
  Administered 2020-07-19 – 2020-07-23 (×5): 81 mg via ORAL
  Filled 2020-07-18 (×5): qty 1

## 2020-07-18 NOTE — Progress Notes (Addendum)
STROKE TEAM PROGRESS NOTE   INTERVAL HISTORY Afebrile. Blood cutlures NG x 2days   Today she reports she is feeling much better overall. She is not sure why she has lived so long and hates to be a burden to others. She reports eating and drinking well. Foley cath in place.   Daughter, Sheryl Suarez, is at bedside. We discussed recent UTI with urinary retention and ongoing antibiotic treatment. Sheryl Suarez reports that ALF RN told her yesterday that patient had to go to SNF and was not well enough to return to ALF. Sheryl Suarez and patient are agreeable to SNF stay. Their questions were answered.    Vitals:   07/17/20 0420 07/17/20 0745 07/17/20 1933 07/17/20 2258  BP: (!) 95/43 (!) 122/57 137/88 122/66  Pulse: 90 84 99 93  Resp: 17 20 15 16   Temp: (!) 97.5 F (36.4 C) 97.8 F (36.6 C) 98.8 F (37.1 C) 97.9 F (36.6 C)  TempSrc: Oral Oral Oral   SpO2: 94% 100% 97% 100%  Weight:      Height:       CBC:  Recent Labs  Lab 07/11/20 1204 07/11/20 1214 07/15/20 1502 07/16/20 0833  WBC 6.0  --  17.9* 8.1  NEUTROABS 3.7  --   --   --   HGB 13.7   < > 12.6 13.2  HCT 44.2   < > 39.1 40.9  MCV 94.0  --  91.8 91.7  PLT 223  --  172 167   < > = values in this interval not displayed.   Basic Metabolic Panel:  Recent Labs  Lab 07/15/20 1502 07/16/20 0833  NA 136 137  K 4.0 3.5  CL 103 105  CO2 22 24  GLUCOSE 147* 93  BUN 17 21  CREATININE 1.03* 0.98  CALCIUM 8.9 9.1   Lipid Panel:  Recent Labs  Lab 07/12/20 0255  CHOL 128  TRIG 58  HDL 42  CHOLHDL 3.0  VLDL 12  LDLCALC 74   HgbA1c:  Recent Labs  Lab 07/12/20 0255  HGBA1C 5.3   Urine Drug Screen: No results for input(s): LABOPIA, COCAINSCRNUR, LABBENZ, AMPHETMU, THCU, LABBARB in the last 168 hours.  Alcohol Level No results for input(s): ETH in the last 168 hours.  IMAGING past 24 hours  1. Occlusion left P2/P3 segment likely acute 2. Moderate to severe stenosis left M3 segment supplying the left frontal parietal lobe. 3.  Advanced atherosclerotic disease aortic arch and proximal left subclavian artery 4. Mild atherosclerotic disease in the carotid bifurcation bilaterally. Moderate stenosis in the cavernous carotid bilaterally due to atherosclerotic disease. 5. Moderate to severe stenosis distal left vertebral artery. 6. These results were called by telephone at the time of interpretation on 07/11/2020 at 12:49 pm to provider [facility] , who verbally acknowledged these results  CXray 1.  Cardiomegaly.  Mild pulmonary venous congestion. 2. Mild bilateral interstitial prominence. Mild interstitial edema and/or pneumonitis can not be excluded. Tiny left pleural effusion cannot excluded.  PHYSICAL EXAM General awake alert in no distress HEENT: Normocephalic/atraumatic Resp: No extra work of breathing  Cardiovascular: Irregularly irregular Extremities warm well perfused Neurological exam She is fully alert sitting up in bed in NAD. Speech is at times hesitant with errors but able to speak in full comprehensible sentences today.  She can follow simple one to two-step commands. She is able to name and repeat. Cranial nerves: Pupils equal round reactive light, left gaze preference, is able to come to midline but not look to  the right consistently, does not blink to threat from the right, mild right lower facial asymmetry. Motor exam: Right upper extremity weakness but able to lift off the bed and perform arm roll and RAMs with impaired speed and accuracy. 3/5 right lower extremity.  Left upper 4/5.  Left lower 4/5. Sensory exam: appears intact  ASSESSMENT/PLAN  Sheryl Suarez is a 87 y.o. female past medical of atrial fibrillation not on anticoagulation, CHF, coronary artery disease, hypertension, history of breast cancer now in remission, at some point on hospice but taken off of hospice because of improvement after medication adjustment, brought into the emergency room for evaluation of sudden onset of  right-sided weakness and inability to talk. tPA was administered.   Left hemispheric embolic infarct on atrial fibrillation and is not on long-term anticoagulation due to history of frequent falls  CT head no acute abnormality   CTA head & neck: Occlusion left P2/P3 segment likely acute,  Moderate to severe stenosis left M3 segment supplying the left frontal parietal lobe, Advanced atherosclerotic disease aortic arch and proximal left subclavian artery, Mild atherosclerotic disease in the carotid bifurcation bilaterally. Moderate stenosis in the cavernous carotid bilaterally due to atherosclerotic disease, Moderate to severe stenosis distal left vertebral artery.   MRI acute left MCA infarct involving posterior insula and parietal operculum.  No hemorrhage  2D Echo EF 50%, Left atrial size was severely dilated, Right atrial size was mildly dilated, mild pulm HTN, No shunt, thrombus or wall motion abnormality  LDL 74  HgbA1c 5.3  VTE prophylaxis - SCDs, lovenox    Diet   Diet regular Room service appropriate? Yes; Fluid consistency: Thin  Consider Eliquis start on 5th day post event (5/26) but not started in palliative setting where individualized decisions are being made on each element of treatment plan.  On ASA 81mg  for the present   Therapy recommendations:  SNF bed offer at [facility] for Tuesday. ALF has declined to for patient to return per Sheryl Suarez, daughter.   Disposition: TBD  Chronic atrial fibrillation  Rate controlled on cardizem  Patient & son agreeable to Eliquis   Metoprolol 5mg  given for RVR this am  Eliquis started (5/26)  Sepsis/E-coli UTI  SIRS criteria in this patient includes: Leukocytosis, fever, tachycardia  Lactate 2.9->3.1  Procalcitonin 38.2  Sepsis protocol  UA (5/26): LE shows E coli   Blood culture (5/26) negative 5/27.   Switched from IV antibiotic to keflex po yesterday  Medical team on board   Hypertension  Initially required  cleviprex infusion   Diltiazem 120mg  daily  home lasix on hold for the present  Hydralazine prn . Keep systolic BP less than 347 . Long-term BP goal normotensive  Hyperlipidemia  Lipitor 10mg    LDL 74, not quite at goal < 70  High intensity statin not warranted as essentially at goal   Continue statin at discharge  Other Stroke Risk Factors  Advanced Age >/= 65    Coronary artery disease  Goals of Care  Palliative following: continue supportive care with antibiotics and IVF if needed, now DNR in semi-comfort care  Other active problems  Urinary retension: foley placed by urology (5/25). [facility] contacted for hospital follow up for foley removal in about 5-7 days. However if patient goes to SNF will cancel.   Delila A Bailey-Modzik, NP-C   ATTENDING NOTE: I reviewed above note and agree with the assessment and plan. Pt was seen and examined.   87 year old female with history of A.  fib not on AC, CHF, CAD, hypertension admitted for right-sided weakness, aphasia. CT no acute finding. Status post tPA.  CTA head and neck left P2/P3 occlusion, bilateral ICA siphon and left M3 stenosis.  MRI showed left MCA infarct.  EF 50%.  LDL 74 and A1c 5.3.  She was also found to have UTI, concerning for urosepsis, Foley catheter placed.  Appreciate internal medicine helping for UTI treatment, was on Unasyn but currently on Keflex.  On exam, pt awake, alert, eyes open, orientated to age, place, month but not to time. Mild aphasia with hesitancy of speech and frequent paraphasic errors, following all simple commands. Able to name but with paraphasic errors and repeat 5-word sentence with paraphasic errors. No gaze palsy, tracking bilaterally, visual field full, PERRL. No facial droop. Tongue midline. LUE no drift but RUE drift but not hitting bed within 10 sec. BLEs seems symmetrical at least 3/4 proximal and 4/5 distally. Sensation symmetrical bilaterally, b/l FTN intact but slow,  gait not tested.   Etiology for patient stroke likely due to A. fib not on AC.  Palliative care involved and patient currently DNR/DNI with more focused on comfort care.  Family request IV fluid and antibiotics treatment during the comfort care management. Put on ASA 81. Pending SNF placement.  For detailed assessment and plan, please refer to above as I have made changes wherever appropriate.   Sheryl Hawking, MD PhD Stroke Neurology 07/18/2020 8:30 PM

## 2020-07-18 NOTE — Progress Notes (Signed)
PROGRESS NOTE    CELESTER Suarez  ENM:[id number] DOB: 03/22/1923 DOA: 07/11/2020 PCP: Leonel Ramsay, MD   Brief Narrative:  87 year old female with history of hypertension, chronic systolic heart failure, breast cancer, CAD, atrial fibrillation presented with acute CVA on 5/21.  Admitted by neurology service.  TRH was consulted for sepsis.  Patient appears to have UTI and currently being treated with cefepime.  Seems the patient was also transition to comfort measures however family would like to continue IV fluids and antibiotics.  Palliative care consulted and pending discharge back to ALF. Assessment & Plan   Acute CVA -Managed by neurology -Likely embolic given her history of atrial fibrillation and was not on long-term anticoagulation due to history of frequent falls -Noted to have an acute left MCA infarct on 5/22 -PT and OT have recommended SNF -Speech therapy working with patient and recommended dysphagia 3 nectar thick diet -was on aspirin and Eliquis, statin- however discontinued as patient has been transitioned to comfort   Sepsis likely secondary to UTI -Patient noted to have leukocytosis, fever and tachycardia -UA showed few bacteria, >50 WBC, negative nitrites, large leukocytes -Urine culture shows >100K E. Coli- pansensitive  -Blood cultures show no growth to date -was on Unasyn??? But also received a dose of ceftriaxone -would recommend keflex 250 BID x 5 days  Essential hypertension -Initially required Cleviprex infusion -was on diltiazem  Hyperlipidemia -was on statin  Atrial fibrillation -Currently rate controlled -Eliquis and cardizem discontinued due to comfort measures  Chronic systolic heart failure -Currently appears to be compensated.  Patient does have lower extremity edema however no shortness of breath -Home Lasix currently held -Monitor intake and output and weights  History of breast cancer -Status post bilateral vasectomy  Urinary  retention -Foley catheter placed by urology on 5/25  Goals of care -Previous hospitalist discussed goals of care with patient's daughter and feel they would like for her to be comfort care however would like to continue IV fluids as well as antibiotics -Palliative care consulted and appreciated -Currently DNR -Pending discharge to ALF with comfort measures  DVT Prophylaxis Comfort care  Code Status: DNR  Family Communication: None at bedside  Disposition Plan:  Status is: Inpatient  Remains inpatient appropriate because:IV treatments appropriate due to intensity of illness or inability to take PO and Inpatient level of care appropriate due to severity of illness   Dispo: The patient is from: ALF              Anticipated d/c is to: TBD              Patient currently is not medically stable to d/c.   Difficult to place patient No   Consultants [facility] Urology Palliative care  Procedures  Echocardiogram  Antibiotics   Anti-infectives (From admission, onward)   Start     Dose/Rate Route Frequency Ordered Stop   07/17/20 1500  cephALEXin (KEFLEX) capsule 250 mg        250 mg Oral Every 12 hours 07/17/20 1405 07/22/20 0959   07/17/20 1200  ceFEPIme (MAXIPIME) 1 g in sodium chloride 0.9 % 100 mL IVPB  Status:  Discontinued        1 g 200 mL/hr over 30 Minutes Intravenous Every 24 hours 07/16/20 1111 07/16/20 1137   07/16/20 1200  ceFEPIme (MAXIPIME) 2 g in sodium chloride 0.9 % 100 mL IVPB  Status:  Discontinued        2 g 200 mL/hr over 30 Minutes Intravenous  Once 07/16/20 1106 07/16/20 1137   07/16/20 1200  Ampicillin-Sulbactam (UNASYN) 3 g in sodium chloride 0.9 % 100 mL IVPB  Status:  Discontinued        3 g 200 mL/hr over 30 Minutes Intravenous Every 12 hours 07/16/20 1137 07/17/20 1405   07/16/20 0900  cefTRIAXone (ROCEPHIN) 1 g in sodium chloride 0.9 % 100 mL IVPB        1 g 200 mL/hr over 30 Minutes Intravenous  Once 07/16/20 0814 07/16/20 6237      Subjective:    [name] seen and examined today.  Upset that no one helped her get set up for breakfast. Was appreciative after helping her with breakfast. Denies chest pain, shortness of breath, abdominal pain, nausea or vomiting, diarrhea or constipation.  States she did not sleep well last night and has been up all morning.  Objective:   Vitals:   07/17/20 0420 07/17/20 0745 07/17/20 1933 07/17/20 2258  BP: (!) 95/43 (!) 122/57 137/88 122/66  Pulse: 90 84 99 93  Resp: 17 20 15 16   Temp: (!) 97.5 F (36.4 C) 97.8 F (36.6 C) 98.8 F (37.1 C) 97.9 F (36.6 C)  TempSrc: Oral Oral Oral   SpO2: 94% 100% 97% 100%  Weight:      Height:        Intake/Output Summary (Last 24 hours) at 07/18/2020 6283 Last data filed at 07/18/2020 0530 Gross per 24 hour  Intake 480 ml  Output 1020 ml  Net -540 ml   Filed Weights   07/11/20 1200 07/11/20 1230  Weight: 54.8 kg 54.8 kg    Exam  General: Well developed, elderly, chronically ill-appearing, NAD  HEENT: NCAT, mucous membranes moist.   Cardiovascular: S1 S2 auscultated, irregular  Respiratory: Diminished breath sounds, however clear, no wheezing  Abdomen: Soft, nontender, nondistended, + bowel sounds  Extremities: warm dry without cyanosis clubbing.  Lower extremity edema  Neuro: AAO x1 (self only), mildly confused this morning.  Nonfocal.  Does have tremor.   Data Reviewed: I have personally reviewed following labs and imaging studies  CBC: Recent Labs  Lab 07/11/20 1204 07/11/20 1214 07/15/20 1502 07/16/20 0833  WBC 6.0  --  17.9* 8.1  NEUTROABS 3.7  --   --   --   HGB 13.7 14.3 12.6 13.2  HCT 44.2 42.0 39.1 40.9  MCV 94.0  --  91.8 91.7  PLT 223  --  172 151   Basic Metabolic Panel: Recent Labs  Lab 07/11/20 1204 07/11/20 1214 07/15/20 1502 07/16/20 0833  NA 139 140 136 137  K 3.9 4.1 4.0 3.5  CL 105 105 103 105  CO2 26  --  22 24  GLUCOSE 98 96 147* 93  BUN 24* 31* 17 21  CREATININE 1.00 0.80 1.03* 0.98   CALCIUM 9.5  --  8.9 9.1   GFR: Estimated Creatinine Clearance: 27.2 mL/min (by C-G formula based on SCr of 0.98 mg/dL). Liver Function Tests: Recent Labs  Lab 07/11/20 1204  AST 15  ALT 9  ALKPHOS 106  BILITOT 0.9  PROT 7.0  ALBUMIN 3.2*   No results for input(s): LIPASE, AMYLASE in the last 168 hours. No results for input(s): AMMONIA in the last 168 hours. Coagulation Profile: Recent Labs  Lab 07/11/20 1204 07/16/20 1123  INR 1.1 1.3*   Cardiac Enzymes: No results for input(s): CKTOTAL, CKMB, CKMBINDEX, TROPONINI in the last 168 hours. BNP (last 3 results) No results for input(s): PROBNP in the last  8760 hours. HbA1C: No results for input(s): HGBA1C in the last 72 hours. CBG: Recent Labs  Lab 07/11/20 1205 07/15/20 1140  GLUCAP 85 154*   Lipid Profile: No results for input(s): CHOL, HDL, LDLCALC, TRIG, CHOLHDL, LDLDIRECT in the last 72 hours. Thyroid Function Tests: No results for input(s): TSH, T4TOTAL, FREET4, T3FREE, THYROIDAB in the last 72 hours. Anemia Panel: No results for input(s): VITAMINB12, FOLATE, FERRITIN, TIBC, IRON, RETICCTPCT in the last 72 hours. Urine analysis:    Component Value Date/Time   COLORURINE AMBER (A) 07/16/2020 1144   APPEARANCEUR HAZY (A) 07/16/2020 1144   LABSPEC 1.014 07/16/2020 1144   PHURINE 6.0 07/16/2020 1144   GLUCOSEU NEGATIVE 07/16/2020 1144   HGBUR LARGE (A) 07/16/2020 1144   BILIRUBINUR NEGATIVE 07/16/2020 [place] 07/16/2020 1144   PROTEINUR 100 (A) 07/16/2020 1144   NITRITE NEGATIVE 07/16/2020 1144   LEUKOCYTESUR LARGE (A) 07/16/2020 1144   Sepsis Labs: @LABRCNTIP (procalcitonin:4,lacticidven:4)  ) Recent Results (from the past 240 hour(s))  SARS CORONAVIRUS 2 (TAT 6-24 HRS) Nasopharyngeal Nasopharyngeal Swab     Status: None   Collection Time: 07/11/20  5:53 PM   Specimen: Nasopharyngeal Swab  Result Value Ref Range Status   SARS Coronavirus 2 NEGATIVE NEGATIVE Final    Comment:  (NOTE) SARS-CoV-2 target nucleic acids are NOT DETECTED.  The SARS-CoV-2 RNA is generally detectable in upper and lower respiratory specimens during the acute phase of infection. Negative results do not preclude SARS-CoV-2 infection, do not rule out co-infections with other pathogens, and should not be used as the sole basis for treatment or other patient management decisions. Negative results must be combined with clinical observations, patient history, and epidemiological information. The expected result is Negative.  Fact Sheet for Patients: SugarRoll.be  Fact Sheet for Healthcare Providers: https://www.woods-mathews.com/  This test is not yet approved or cleared by the [place] FDA and  has been authorized for detection and/or diagnosis of SARS-CoV-2 by FDA under an Emergency Use Authorization (EUA). This EUA will remain  in effect (meaning this test can be used) for the duration of the COVID-19 declaration under Se ction 564(b)(1) of the Act, 21 U.S.C. section 360bbb-3(b)(1), unless the authorization is terminated or revoked sooner.  Performed at [facility], [place] [address] [place], [place] [address]   MRSA PCR Screening     Status: None   Collection Time: 07/11/20  6:22 PM   Specimen: Nasal Mucosa; Nasopharyngeal  Result Value Ref Range Status   MRSA by PCR NEGATIVE NEGATIVE Final    Comment:        The GeneXpert MRSA Assay (FDA approved for NASAL specimens only), is one component of a comprehensive MRSA colonization surveillance program. It is not intended to diagnose MRSA infection nor to guide or monitor treatment for MRSA infections. Performed at [facility], [place] [address] [place], [place] [address]   Culture, Urine     Status: Abnormal   Collection Time: 07/15/20  7:04 PM   Specimen: Urine, Catheterized  Result Value Ref Range Status   Specimen Description URINE, CATHETERIZED  Final   Special  Requests   Final    NONE Performed at [facility], [address] [place], [place] [address]    Culture >=100,000 COLONIES/mL ESCHERICHIA COLI (A)  Final   Report Status 07/18/2020 FINAL  Final   Organism ID, Bacteria ESCHERICHIA COLI (A)  Final      Susceptibility   Escherichia coli - MIC*  AMPICILLIN <=2 SENSITIVE Sensitive     CEFAZOLIN <=4 SENSITIVE Sensitive     CEFEPIME <=0.12 SENSITIVE Sensitive     CEFTRIAXONE <=0.25 SENSITIVE Sensitive     CIPROFLOXACIN <=0.25 SENSITIVE Sensitive     GENTAMICIN <=1 SENSITIVE Sensitive     IMIPENEM <=0.25 SENSITIVE Sensitive     NITROFURANTOIN <=16 SENSITIVE Sensitive     TRIMETH/SULFA <=20 SENSITIVE Sensitive     AMPICILLIN/SULBACTAM <=2 SENSITIVE Sensitive     PIP/TAZO <=4 SENSITIVE Sensitive     * >=100,000 COLONIES/mL ESCHERICHIA COLI  Culture, blood (routine x 2)     Status: None (Preliminary result)   Collection Time: 07/16/20  8:33 AM   Specimen: BLOOD LEFT HAND  Result Value Ref Range Status   Specimen Description BLOOD LEFT HAND  Final   Special Requests   Final    BOTTLES DRAWN AEROBIC AND ANAEROBIC Blood Culture adequate volume   Culture   Final    NO GROWTH 2 DAYS Performed at [facility], [address], [place], [place] [address]    Report Status PENDING  Incomplete  Culture, blood (routine x 2)     Status: None (Preliminary result)   Collection Time: 07/16/20  8:39 AM   Specimen: BLOOD RIGHT WRIST  Result Value Ref Range Status   Specimen Description BLOOD RIGHT WRIST  Final   Special Requests   Final    BOTTLES DRAWN AEROBIC AND ANAEROBIC Blood Culture adequate volume   Culture   Final    NO GROWTH 2 DAYS Performed at [facility], [place] [address] [place],  [address]    Report Status PENDING  Incomplete      Radiology Studies: No results found.   Scheduled Meds: . cephALEXin  250 mg Oral Q12H  . mouth rinse  15 mL Mouth Rinse BID  . mirtazapine  15 mg Oral QHS   Continuous  Infusions: . lactated ringers    . lactated ringers       LOS: 7 days   Time Spent in minutes   30 minutes  Coy Rochford D.O. on 07/18/2020 at 9:38 AM  Between 7am to 7pm - Please see pager noted on [email or web address]  After 7pm go to [email or web address]  And look for the night coverage person covering for me after hours  [facility] Office  [phone]

## 2020-07-19 DIAGNOSIS — I639 Cerebral infarction, unspecified: Secondary | ICD-10-CM | POA: Diagnosis not present

## 2020-07-19 DIAGNOSIS — I482 Chronic atrial fibrillation, unspecified: Secondary | ICD-10-CM | POA: Diagnosis not present

## 2020-07-19 DIAGNOSIS — Z515 Encounter for palliative care: Secondary | ICD-10-CM | POA: Diagnosis not present

## 2020-07-19 DIAGNOSIS — Z7189 Other specified counseling: Secondary | ICD-10-CM | POA: Diagnosis not present

## 2020-07-19 DIAGNOSIS — Z66 Do not resuscitate: Secondary | ICD-10-CM | POA: Diagnosis not present

## 2020-07-19 DIAGNOSIS — I10 Essential (primary) hypertension: Secondary | ICD-10-CM | POA: Diagnosis not present

## 2020-07-19 NOTE — Progress Notes (Signed)
PROGRESS NOTE    Sheryl Suarez  QMG:[id number] DOB: 07-23-1923 DOA: 07/11/2020 PCP: Leonel Ramsay, MD   Brief Narrative:  87 year old female with history of hypertension, chronic systolic heart failure, breast cancer, CAD, atrial fibrillation presented with acute CVA on 5/21.  Admitted by neurology service.  TRH was consulted for sepsis.  Patient appears to have UTI and currently being treated with cefepime.  Seems the patient was also transition to comfort measures however family would like to continue IV fluids and antibiotics.  Palliative care consulted and pending discharge back to ALF. Assessment & Plan   Acute CVA -Managed by neurology -Likely embolic given her history of atrial fibrillation and was not on long-term anticoagulation due to history of frequent falls -Noted to have an acute left MCA infarct on 5/22 -PT and OT have recommended SNF -Speech therapy working with patient and recommended dysphagia 3 nectar thick diet- transitioned to regular diet -was on aspirin and Eliquis, statin- however discontinued as patient has been transitioned to comfort   Sepsis likely secondary to UTI -Patient noted to have leukocytosis, fever and tachycardia -UA showed few bacteria, >50 WBC, negative nitrites, large leukocytes -Urine culture shows >100K E. Coli- pansensitive  -Blood cultures show no growth to date -was on Unasyn??? But also received a dose of ceftriaxone -would recommend keflex 250 BID x 5 days  Essential hypertension -Initially required Cleviprex infusion -was on diltiazem  Hyperlipidemia -was on statin  Atrial fibrillation -Currently rate controlled -Eliquis and cardizem discontinued due to comfort measures  Chronic systolic heart failure -Currently appears to be compensated.  Patient does have lower extremity edema however no shortness of breath -Home Lasix currently held -Monitor intake and output and weights  History of breast cancer -Status post  bilateral vasectomy  Urinary retention -Foley catheter placed by urology on 5/25  Goals of care -Previous hospitalist discussed goals of care with patient's daughter and feel they would like for her to be comfort care however would like to continue IV fluids as well as antibiotics -Palliative care consulted and appreciated -Currently DNR -Pending discharge to ALF with comfort measures  DVT Prophylaxis Comfort care  Code Status: DNR  Family Communication: None at bedside  Disposition Plan:  Status is: Inpatient  Remains inpatient appropriate because:IV treatments appropriate due to intensity of illness or inability to take PO and Inpatient level of care appropriate due to severity of illness   Dispo: The patient is from: ALF              Anticipated d/c is to: TBD              Patient currently is not medically stable to d/c.   Difficult to place patient No   Consultants [facility] Urology Palliative care  Procedures  Echocardiogram  Antibiotics   Anti-infectives (From admission, onward)   Start     Dose/Rate Route Frequency Ordered Stop   07/17/20 1500  cephALEXin (KEFLEX) capsule 250 mg        250 mg Oral Every 12 hours 07/17/20 1405 07/22/20 0959   07/17/20 1200  ceFEPIme (MAXIPIME) 1 g in sodium chloride 0.9 % 100 mL IVPB  Status:  Discontinued        1 g 200 mL/hr over 30 Minutes Intravenous Every 24 hours 07/16/20 1111 07/16/20 1137   07/16/20 1200  ceFEPIme (MAXIPIME) 2 g in sodium chloride 0.9 % 100 mL IVPB  Status:  Discontinued        2 g 200 mL/hr  over 30 Minutes Intravenous  Once 07/16/20 1106 07/16/20 1137   07/16/20 1200  Ampicillin-Sulbactam (UNASYN) 3 g in sodium chloride 0.9 % 100 mL IVPB  Status:  Discontinued        3 g 200 mL/hr over 30 Minutes Intravenous Every 12 hours 07/16/20 1137 07/17/20 1405   07/16/20 0900  cefTRIAXone (ROCEPHIN) 1 g in sodium chloride 0.9 % 100 mL IVPB        1 g 200 mL/hr over 30 Minutes Intravenous  Once 07/16/20 0814  07/16/20 5631      Subjective:   Sheryl Suarez seen and examined today.  Patient with no complaints this morning.  Was eating her breakfast.  Denied current chest pain or shortness of breath, dizziness or headache.    Objective:   Vitals:   07/17/20 0745 07/17/20 1933 07/17/20 2258 07/18/20 2026  BP: (!) 122/57 137/88 122/66 (!) 126/55  Pulse: 84 99 93 70  Resp: 20 15 16 20   Temp: 97.8 F (36.6 C) 98.8 F (37.1 C) 97.9 F (36.6 C) 98.4 F (36.9 C)  TempSrc: Oral Oral  Oral  SpO2: 100% 97% 100% 94%  Weight:      Height:        Intake/Output Summary (Last 24 hours) at 07/19/2020 1223 Last data filed at 07/19/2020 1148 Gross per 24 hour  Intake 857 ml  Output 1326 ml  Net -469 ml   Filed Weights   07/11/20 1200 07/11/20 1230  Weight: 54.8 kg 54.8 kg    Exam  General: Well developed, elderly, chronically ill-appearing, NAD  HEENT: NCAT, mucous membranes moist.   Cardiovascular: S1 S2 auscultated, irregular  Respiratory: Diminished breath sounds, however clear, no wheezing  Abdomen: Soft, nontender, nondistended, + bowel sounds  Extremities: warm dry without cyanosis clubbing.  Lower extremity edema  Neuro: AAO x1 (self only), mildly confused this morning.  Nonfocal.  Does have tremor.   Data Reviewed: I have personally reviewed following labs and imaging studies  CBC: Recent Labs  Lab 07/15/20 1502 07/16/20 0833  WBC 17.9* 8.1  HGB 12.6 13.2  HCT 39.1 40.9  MCV 91.8 91.7  PLT 172 497   Basic Metabolic Panel: Recent Labs  Lab 07/15/20 1502 07/16/20 0833  NA 136 137  K 4.0 3.5  CL 103 105  CO2 22 24  GLUCOSE 147* 93  BUN 17 21  CREATININE 1.03* 0.98  CALCIUM 8.9 9.1   GFR: Estimated Creatinine Clearance: 27.2 mL/min (by C-G formula based on SCr of 0.98 mg/dL). Liver Function Tests: No results for input(s): AST, ALT, ALKPHOS, BILITOT, PROT, ALBUMIN in the last 168 hours. No results for input(s): LIPASE, AMYLASE in the last 168 hours. No  results for input(s): AMMONIA in the last 168 hours. Coagulation Profile: Recent Labs  Lab 07/16/20 1123  INR 1.3*   Cardiac Enzymes: No results for input(s): CKTOTAL, CKMB, CKMBINDEX, TROPONINI in the last 168 hours. BNP (last 3 results) No results for input(s): PROBNP in the last 8760 hours. HbA1C: No results for input(s): HGBA1C in the last 72 hours. CBG: Recent Labs  Lab 07/15/20 1140  GLUCAP 154*   Lipid Profile: No results for input(s): CHOL, HDL, LDLCALC, TRIG, CHOLHDL, LDLDIRECT in the last 72 hours. Thyroid Function Tests: No results for input(s): TSH, T4TOTAL, FREET4, T3FREE, THYROIDAB in the last 72 hours. Anemia Panel: No results for input(s): VITAMINB12, FOLATE, FERRITIN, TIBC, IRON, RETICCTPCT in the last 72 hours. Urine analysis:    Component Value Date/Time   COLORURINE AMBER (  A) 07/16/2020 1144   APPEARANCEUR HAZY (A) 07/16/2020 1144   LABSPEC 1.014 07/16/2020 1144   PHURINE 6.0 07/16/2020 1144   GLUCOSEU NEGATIVE 07/16/2020 1144   HGBUR LARGE (A) 07/16/2020 1144   BILIRUBINUR NEGATIVE 07/16/2020 [place] 07/16/2020 1144   PROTEINUR 100 (A) 07/16/2020 1144   NITRITE NEGATIVE 07/16/2020 1144   LEUKOCYTESUR LARGE (A) 07/16/2020 1144   Sepsis Labs: @LABRCNTIP (procalcitonin:4,lacticidven:4)  ) Recent Results (from the past 240 hour(s))  SARS CORONAVIRUS 2 (TAT 6-24 HRS) Nasopharyngeal Nasopharyngeal Swab     Status: None   Collection Time: 07/11/20  5:53 PM   Specimen: Nasopharyngeal Swab  Result Value Ref Range Status   SARS Coronavirus 2 NEGATIVE NEGATIVE Final    Comment: (NOTE) SARS-CoV-2 target nucleic acids are NOT DETECTED.  The SARS-CoV-2 RNA is generally detectable in upper and lower respiratory specimens during the acute phase of infection. Negative results do not preclude SARS-CoV-2 infection, do not rule out co-infections with other pathogens, and should not be used as the sole basis for treatment or other patient  management decisions. Negative results must be combined with clinical observations, patient history, and epidemiological information. The expected result is Negative.  Fact Sheet for Patients: SugarRoll.be  Fact Sheet for Healthcare Providers: https://www.woods-mathews.com/  This test is not yet approved or cleared by the [place] FDA and  has been authorized for detection and/or diagnosis of SARS-CoV-2 by FDA under an Emergency Use Authorization (EUA). This EUA will remain  in effect (meaning this test can be used) for the duration of the COVID-19 declaration under Se ction 564(b)(1) of the Act, 21 U.S.C. section 360bbb-3(b)(1), unless the authorization is terminated or revoked sooner.  Performed at [facility], [place] [address] [place], [place] [address]   MRSA PCR Screening     Status: None   Collection Time: 07/11/20  6:22 PM   Specimen: Nasal Mucosa; Nasopharyngeal  Result Value Ref Range Status   MRSA by PCR NEGATIVE NEGATIVE Final    Comment:        The GeneXpert MRSA Assay (FDA approved for NASAL specimens only), is one component of a comprehensive MRSA colonization surveillance program. It is not intended to diagnose MRSA infection nor to guide or monitor treatment for MRSA infections. Performed at [facility], [place] [address] [place], [place] [address]   Culture, Urine     Status: Abnormal   Collection Time: 07/15/20  7:04 PM   Specimen: Urine, Catheterized  Result Value Ref Range Status   Specimen Description URINE, CATHETERIZED  Final   Special Requests   Final    NONE Performed at [facility], [address] [place], [place] [address]    Culture >=100,000 COLONIES/mL ESCHERICHIA COLI (A)  Final   Report Status 07/18/2020 FINAL  Final   Organism ID, Bacteria ESCHERICHIA COLI (A)  Final      Susceptibility   Escherichia coli - MIC*    AMPICILLIN <=2 SENSITIVE Sensitive     CEFAZOLIN <=4  SENSITIVE Sensitive     CEFEPIME <=0.12 SENSITIVE Sensitive     CEFTRIAXONE <=0.25 SENSITIVE Sensitive     CIPROFLOXACIN <=0.25 SENSITIVE Sensitive     GENTAMICIN <=1 SENSITIVE Sensitive     IMIPENEM <=0.25 SENSITIVE Sensitive     NITROFURANTOIN <=16 SENSITIVE Sensitive     TRIMETH/SULFA <=20 SENSITIVE Sensitive     AMPICILLIN/SULBACTAM <=2 SENSITIVE Sensitive     PIP/TAZO <=4 SENSITIVE Sensitive     * >=100,000  COLONIES/mL ESCHERICHIA COLI  Culture, blood (routine x 2)     Status: None (Preliminary result)   Collection Time: 07/16/20  8:33 AM   Specimen: BLOOD LEFT HAND  Result Value Ref Range Status   Specimen Description BLOOD LEFT HAND  Final   Special Requests   Final    BOTTLES DRAWN AEROBIC AND ANAEROBIC Blood Culture adequate volume   Culture   Final    NO GROWTH 3 DAYS Performed at [facility], [address] [place], [place] [address]    Report Status PENDING  Incomplete  Culture, blood (routine x 2)     Status: None (Preliminary result)   Collection Time: 07/16/20  8:39 AM   Specimen: BLOOD RIGHT WRIST  Result Value Ref Range Status   Specimen Description BLOOD RIGHT WRIST  Final   Special Requests   Final    BOTTLES DRAWN AEROBIC AND ANAEROBIC Blood Culture adequate volume   Culture   Final    NO GROWTH 3 DAYS Performed at [facility], [place] [address] [place], [place] [address]    Report Status PENDING  Incomplete      Radiology Studies: No results found.   Scheduled Meds: . aspirin EC  81 mg Oral Daily  . cephALEXin  250 mg Oral Q12H  . mouth rinse  15 mL Mouth Rinse BID  . mirtazapine  15 mg Oral QHS   Continuous Infusions: . lactated ringers    . lactated ringers       LOS: 8 days   Time Spent in minutes   30 minutes  Amiera Herzberg D.O. on 07/19/2020 at 12:23 PM  Between 7am to 7pm - Please see pager noted on [email or web address]  After 7pm go to [email or web address]  And look for the night coverage person covering for me after  hours  [facility] Office  [phone]

## 2020-07-19 NOTE — Progress Notes (Signed)
Palliative Medicine Inpatient Follow Up Note  Reason for consult:  End of Life Care  HPI:  Per intake H&P --> Sheryl Suarez an 87 y.o.femalewith h/o HTN; CAD; chronic systolic CHF; breast cancer; and afib who presented with CVA on 5/21.The patient has mild dysarthria and word finding difficulties.   She suffered from severe sepsis in the setting of a UTI.  She had been made comfort oriented care by the hospitalist team in the setting of these events with the continuation of antibiotics and IV fluids.  Palliative care has been asked to get involved to further address end-of-life care and disposition needs.  Today's Discussion (07/19/2020):  *Please note that this is a verbal dictation therefore any spelling or grammatical errors are due to the "[place] [facility]" system interpretation.  Chart reviewed.   I met at bedside with Sheryl Suarez this morning. She was awake and alert. She reports feeling well this morning other than generalized weakness. Reviewed the plan for placement Sheryl [facility]arez once a bed becomes available.  This is anticipated to happen as early as Sheryl Suarez.  I called patient's daughter, Sheryl Suarez.  We discussed over the phone how Sheryl Suarez has been doing.  Sheryl Suarez stairs yesterday was a very bad day likely in the setting of her having received a COVID booster.  The second [place] had received left her very lethargic and weak for a number of days.  Sheryl Suarez feels that this is likely happening again.  Discussed the plan for transfer on Sheryl Suarez with the hopes of lowest being able to participate in rehab.  If unable to participate she will then transition to hospice at [facility]yl [facility]arez.  The chances of her going back to [facility] are very poor due to patient's debility.  Summarized the plan for ongoing supportive care but no extraneous heroic measures or escalation of care should Sheryl Suarez worsen.  Questions and concerns addressed   Objective Assessment: Vital Signs Vitals:    07/17/20 2258 07/18/20 2026  BP: 122/66 (!) 126/55  Pulse: 93 70  Resp: 16 20  Temp: 97.9 F (36.6 C) 98.4 F (36.9 C)  SpO2: 100% 94%    Intake/Output Summary (Last 24 hours) at 07/19/2020 1146 Last data filed at 07/19/2020 1008 Gross per 24 hour  Intake 437 ml  Output 1326 ml  Net -889 ml   Last Weight  Most recent update: 07/11/2020  1:10 PM   Weight  54.8 kg (120 lb 13 oz)           Gen: Elderly Caucasian female in no acute distress HEENT: moist mucous membranes CV: Regular rate and rhythm PULM: On 2 L/min ABD: soft/nontender EXT: No edema Neuro: Alert and oriented to person  SUMMARY OF RECOMMENDATIONS DNAR/DNI  MOST Completed, paper copy placed onto the chart electric copy can be found in [employer]  DNR Form Completed, paper copy placed onto the chart electric copy can be found in [employer]  Continue antibiotics and  IV fluids  Comfort meds per [facility]  Goal: Transition to [facility] on Sheryl Suarez.  Ideally patient would be compliant with physical therapy though if not she will transition to hospice care.  Patient's family aware that Sheryl Suarez continues on comfort care in house and that at any point anything can happen in terms of clinical deterioration.  They are aware of this and agree with continued comfort care.  Going incremental palliative care support  Time Spent: 35 Greater than 50% of the time was spent in counseling and coordination of  care ______________________________________________________________________________________ [facility] Palliative Medicine Team Team Cell Phone: [phone] Please utilize secure chat with additional questions, if there is no response within 30 minutes please call the above phone number  Palliative Medicine Team providers are available by phone from 7am to 7pm daily and can be reached through the team cell phone.  Should this patient require assistance outside of these hours, please call the patient's  attending physician.

## 2020-07-19 NOTE — Progress Notes (Addendum)
STROKE TEAM PROGRESS NOTE   INTERVAL HISTORY Remains afebrile. Blood cultures NG on final report.   She is sitting up in chair at bedside watching tv. No visitors at bedside. Today she is feeling "stronger every day". She denies any new concerns or symptoms. She states that God is in charge of her health. She understands that she may leave no Tuesday to SNF offering bed.   Spoke with daughter, Sheryl Suarez, by phone. She confirms they do not desire for patient to be on an anticoagulant due to falls. They wish to continue with current treatment plan of ASA 81mg  alone. They are planning on resuming hospice care once settled at SNF.   Vitals:   07/17/20 0745 07/17/20 1933 07/17/20 2258 07/18/20 2026  BP: (!) 122/57 137/88 122/66 (!) 126/55  Pulse: 84 99 93 70  Resp: 20 15 16 20   Temp: 97.8 F (36.6 C) 98.8 F (37.1 C) 97.9 F (36.6 C) 98.4 F (36.9 C)  TempSrc: Oral Oral  Oral  SpO2: 100% 97% 100% 94%  Weight:      Height:       CBC:  Recent Labs  Lab 07/15/20 1502 07/16/20 0833  WBC 17.9* 8.1  HGB 12.6 13.2  HCT 39.1 40.9  MCV 91.8 91.7  PLT 172 938   Basic Metabolic Panel:  Recent Labs  Lab 07/15/20 1502 07/16/20 0833  NA 136 137  K 4.0 3.5  CL 103 105  CO2 22 24  GLUCOSE 147* 93  BUN 17 21  CREATININE 1.03* 0.98  CALCIUM 8.9 9.1   Lipid Panel:  No results for input(s): CHOL, TRIG, HDL, CHOLHDL, VLDL, LDLCALC in the last 168 hours. HgbA1c:  No results for input(s): HGBA1C in the last 168 hours. Urine Drug Screen: No results for input(s): LABOPIA, COCAINSCRNUR, LABBENZ, AMPHETMU, THCU, LABBARB in the last 168 hours.  Alcohol Level No results for input(s): ETH in the last 168 hours.  IMAGING  1. Occlusion left P2/P3 segment likely acute 2. Moderate to severe stenosis left M3 segment supplying the left frontal parietal lobe. 3. Advanced atherosclerotic disease aortic arch and proximal left subclavian artery 4. Mild atherosclerotic disease in the carotid  bifurcation bilaterally. Moderate stenosis in the cavernous carotid bilaterally due to atherosclerotic disease. 5. Moderate to severe stenosis distal left vertebral artery. 6. These results were called by telephone at the time of interpretation on 07/11/2020 at 12:49 pm to provider [facility] , who verbally acknowledged these results  CXray 1.  Cardiomegaly.  Mild pulmonary venous congestion. 2. Mild bilateral interstitial prominence. Mild interstitial edema and/or pneumonitis can not be excluded. Tiny left pleural effusion cannot excluded.  PHYSICAL EXAM General awake alert in no distress HEENT: Normocephalic/atraumatic Resp: No extra work of breathing  Cardiovascular: Irregularly irregular Extremities warm well perfused Neurological exam She is alert today. Conversant with speech that is hesitant with occasional errors. sentences today.  She can follow simple one to two-step commands. She is able to name and repeat. Cranial nerves: Pupils equal round reactive light, left gaze preference, is able to come to midline but not look to the right consistently, does not blink to threat from the right, mild right lower facial asymmetry. Motor exam: Right upper extremity weakness but able to lift off the bed and perform arm roll and RAMs with impaired speed and accuracy. 3/5 right lower extremity.  Left upper 4/5.  Left lower 4/5. Sensory exam: appears intact  ASSESSMENT/PLAN  Sheryl Suarez is a 87 y.o. female past medical  of atrial fibrillation not on anticoagulation, CHF, coronary artery disease, hypertension, history of breast cancer now in remission, at some point on hospice but taken off of hospice because of improvement after medication adjustment, brought into the emergency room for evaluation of sudden onset of right-sided weakness and inability to talk. tPA was administered.   Left hemispheric embolic infarct on atrial fibrillation and is not on long-term anticoagulation due to history  of frequent falls  CT head no acute abnormality   CTA head & neck: Occlusion left P2/P3 segment likely acute,  Moderate to severe stenosis left M3 segment supplying the left frontal parietal lobe, Advanced atherosclerotic disease aortic arch and proximal left subclavian artery, Mild atherosclerotic disease in the carotid bifurcation bilaterally. Moderate stenosis in the cavernous carotid bilaterally due to atherosclerotic disease, Moderate to severe stenosis distal left vertebral artery.   MRI acute left MCA infarct involving posterior insula and parietal operculum.  No hemorrhage  2D Echo EF 50%, Left atrial size was severely dilated, Right atrial size was mildly dilated, mild pulm HTN, No shunt, thrombus or wall motion abnormality  LDL 74  HgbA1c 5.3  VTE prophylaxis - SCDs, lovenox    Diet   Diet regular Room service appropriate? Yes; Fluid consistency: Thin  Consider Eliquis start on 5th day post event (5/26) but not started in palliative setting where individualized decisions are being made on each element of treatment plan.  On ASA 81mg  for the present. Per conversation with daughter, Sheryl Suarez, they do not want to anticoagulation and will plan to involve hospice again once she gets settled at [facility].   Therapy recommendations:  SNF bed offer at [facility] for Tuesday. ALF has declined to for patient to return per Sheryl Suarez, daughter.   Disposition: TBD  Chronic atrial fibrillation  Rate controlled on cardizem  Patient & son agreeable to Eliquis   Metoprolol 5mg  given for RVR this am  Eliquis started (5/26)  Sepsis/E-coli UTI  SIRS criteria in this patient includes: Leukocytosis, fever, tachycardia  Lactate 2.9->3.1  Procalcitonin 38.2  Sepsis protocol  UA (5/26): LE shows E coli   Blood culture (5/26) negative 5/27.   Switched from IV antibiotic to keflex po yesterday  Medical team on board   Hypertension  Initially required cleviprex infusion   Hydralazine  prn . Long-term BP goal normotensive  Hyperlipidemia  Lipitor 10mg    LDL 74, not quite at goal < 70  High intensity statin not warranted as essentially at goal   Continue statin at discharge  Other Stroke Risk Factors  Advanced Age >/= 61    Coronary artery disease  Goals of Care  Palliative following: continue supportive care with antibiotics and IVF if needed, now DNR in semi-comfort care  Other active problems  Urinary retension: foley placed by urology (5/25). [facility] urology contacted for hospital follow up for foley removal in about 5-7 days. However if patient goes to SNF will cancel.   Delila A Bailey-Modzik, NP-C  ATTENDING NOTE: I reviewed above note and agree with the assessment and plan. Pt was seen and examined.   No family at bedside, patient sitting in chair, awake alert, still has mild expressive aphasia, stated " I am 89, I will know why I am still around".  Neuro stable.  On aspirin 81.  Per our NP, family declined anticoagulation given risk of fall, potential hospice care in the near future, and advanced age with depression.  Okay to continue aspirin 81 per family.  Palliative care on board,  no escalation of care if patient condition worsens.  Pending SNF.  For detailed assessment and plan, please refer to above as I have made changes wherever appropriate.   Rosalin Hawking, MD PhD Stroke Neurology 07/19/2020 7:15 PM

## 2020-07-20 DIAGNOSIS — I482 Chronic atrial fibrillation, unspecified: Secondary | ICD-10-CM | POA: Diagnosis not present

## 2020-07-20 DIAGNOSIS — I639 Cerebral infarction, unspecified: Secondary | ICD-10-CM | POA: Diagnosis not present

## 2020-07-20 DIAGNOSIS — Z7189 Other specified counseling: Secondary | ICD-10-CM | POA: Diagnosis not present

## 2020-07-20 DIAGNOSIS — I10 Essential (primary) hypertension: Secondary | ICD-10-CM | POA: Diagnosis not present

## 2020-07-20 DIAGNOSIS — Z66 Do not resuscitate: Secondary | ICD-10-CM | POA: Diagnosis not present

## 2020-07-20 NOTE — Progress Notes (Signed)
PROGRESS NOTE    Sheryl Suarez  IFO:[id number] DOB: 07/20/1923 DOA: 07/11/2020 PCP: Leonel Ramsay, MD   Brief Narrative:  87 year old female with history of hypertension, chronic systolic heart failure, breast cancer, CAD, atrial fibrillation presented with acute CVA on 5/21.  Admitted by neurology service.  TRH was consulted for sepsis.  Patient appears to have UTI and currently being treated with cefepime.  Seems the patient was also transition to comfort measures however family would like to continue IV fluids and antibiotics.  Palliative care consulted and pending discharge back to ALF. Assessment & Plan   Acute CVA -Managed by neurology -Likely embolic given her history of atrial fibrillation and was not on long-term anticoagulation due to history of frequent falls -Noted to have an acute left MCA infarct on 5/22 -PT and OT have recommended SNF -Speech therapy working with patient and recommended dysphagia 3 nectar thick diet- transitioned to regular diet -was on aspirin and Eliquis, statin- however discontinued as patient has been transitioned to comfort   Sepsis likely secondary to UTI -Patient noted to have leukocytosis, fever and tachycardia -UA showed few bacteria, >50 WBC, negative nitrites, large leukocytes -Urine culture shows >100K E. Coli- pansensitive  -Blood cultures show no growth to date -was on Unasyn for urine culture results from 2021, but also received a dose of ceftriaxone -would recommend keflex 250 BID x 5 days  Essential hypertension -Initially required Cleviprex infusion -was on diltiazem  Hyperlipidemia -was on statin  Atrial fibrillation -Currently rate controlled -Eliquis and cardizem discontinued due to comfort measures  Chronic systolic heart failure -Currently appears to be compensated.  Patient does have lower extremity edema however no shortness of breath -Home Lasix currently held -Monitor intake and output and weights  History of  breast cancer -Status post bilateral vasectomy  Urinary retention -Foley catheter placed by urology on 5/25  Goals of care -Previous hospitalist discussed goals of care with patient's daughter and feel they would like for her to be comfort care however would like to continue IV fluids as well as antibiotics -Palliative care consulted and appreciated -Currently DNR -Pending discharge to ALF with comfort measures  DVT Prophylaxis Comfort care  Code Status: DNR  Family Communication: None at bedside  Disposition Plan:  Status is: Inpatient  Remains inpatient appropriate because:IV treatments appropriate due to intensity of illness or inability to take PO and Inpatient level of care appropriate due to severity of illness   Dispo: The patient is from: ALF              Anticipated d/c is to: TBD              Patient currently is not medically stable to d/c.   Difficult to place patient No   Consultants [facility] Palliative care  Procedures  Echocardiogram  Antibiotics   Anti-infectives (From admission, onward)   Start     Dose/Rate Route Frequency Ordered Stop   07/17/20 1500  cephALEXin (KEFLEX) capsule 250 mg        250 mg Oral Every 12 hours 07/17/20 1405 07/22/20 0959   07/17/20 1200  ceFEPIme (MAXIPIME) 1 g in sodium chloride 0.9 % 100 mL IVPB  Status:  Discontinued        1 g 200 mL/hr over 30 Minutes Intravenous Every 24 hours 07/16/20 1111 07/16/20 1137   07/16/20 1200  ceFEPIme (MAXIPIME) 2 g in sodium chloride 0.9 % 100 mL IVPB  Status:  Discontinued  2 g 200 mL/hr over 30 Minutes Intravenous  Once 07/16/20 1106 07/16/20 1137   07/16/20 1200  Ampicillin-Sulbactam (UNASYN) 3 g in sodium chloride 0.9 % 100 mL IVPB  Status:  Discontinued        3 g 200 mL/hr over 30 Minutes Intravenous Every 12 hours 07/16/20 1137 07/17/20 1405   07/16/20 0900  cefTRIAXone (ROCEPHIN) 1 g in sodium chloride 0.9 % 100 mL IVPB        1 g 200 mL/hr over 30 Minutes Intravenous   Once 07/16/20 0814 07/16/20 2376      Subjective:   Sheryl Suarez seen and examined today.  Patient with no complaints this morning.  Denies chest pain or shortness of breath, dizziness or headache.    Objective:   Vitals:   07/17/20 2258 07/18/20 2026 07/19/20 1449 07/20/20 0410  BP: 122/66 (!) 126/55 (!) 142/69 (!) 162/88  Pulse: 93 70 97 98  Resp: 16 20 18 18   Temp: 97.9 F (36.6 C) 98.4 F (36.9 C) 98.1 F (36.7 C) 98.4 F (36.9 C)  TempSrc:  Oral Oral Oral  SpO2: 100% 94% 95% 96%  Weight:      Height:        Intake/Output Summary (Last 24 hours) at 07/20/2020 1131 Last data filed at 07/20/2020 2831 Gross per 24 hour  Intake 600 ml  Output 1875 ml  Net -1275 ml   Filed Weights   07/11/20 1200 07/11/20 1230  Weight: 54.8 kg 54.8 kg    Exam  General: Well developed, elderly, chronically ill-appearing, NAD  HEENT: NCAT, mucous membranes moist.   Cardiovascular: S1 S2 auscultated, irregular  Respiratory: Diminished breath sounds, however clear, no wheezing  Abdomen: Soft, nontender, nondistended, + bowel sounds  Extremities: warm dry without cyanosis clubbing.  Lower extremity edema  Neuro: AAO x1 (self only), mildly confused this morning.  Does have tremor.   Data Reviewed: I have personally reviewed following labs and imaging studies  CBC: Recent Labs  Lab 07/15/20 1502 07/16/20 0833  WBC 17.9* 8.1  HGB 12.6 13.2  HCT 39.1 40.9  MCV 91.8 91.7  PLT 172 517   Basic Metabolic Panel: Recent Labs  Lab 07/15/20 1502 07/16/20 0833  NA 136 137  K 4.0 3.5  CL 103 105  CO2 22 24  GLUCOSE 147* 93  BUN 17 21  CREATININE 1.03* 0.98  CALCIUM 8.9 9.1   GFR: Estimated Creatinine Clearance: 27.2 mL/min (by C-G formula based on SCr of 0.98 mg/dL). Liver Function Tests: No results for input(s): AST, ALT, ALKPHOS, BILITOT, PROT, ALBUMIN in the last 168 hours. No results for input(s): LIPASE, AMYLASE in the last 168 hours. No results for input(s):  AMMONIA in the last 168 hours. Coagulation Profile: Recent Labs  Lab 07/16/20 1123  INR 1.3*   Cardiac Enzymes: No results for input(s): CKTOTAL, CKMB, CKMBINDEX, TROPONINI in the last 168 hours. BNP (last 3 results) No results for input(s): PROBNP in the last 8760 hours. HbA1C: No results for input(s): HGBA1C in the last 72 hours. CBG: Recent Labs  Lab 07/15/20 1140  GLUCAP 154*   Lipid Profile: No results for input(s): CHOL, HDL, LDLCALC, TRIG, CHOLHDL, LDLDIRECT in the last 72 hours. Thyroid Function Tests: No results for input(s): TSH, T4TOTAL, FREET4, T3FREE, THYROIDAB in the last 72 hours. Anemia Panel: No results for input(s): VITAMINB12, FOLATE, FERRITIN, TIBC, IRON, RETICCTPCT in the last 72 hours. Urine analysis:    Component Value Date/Time   COLORURINE AMBER (A) 07/16/2020 1144  APPEARANCEUR HAZY (A) 07/16/2020 1144   LABSPEC 1.014 07/16/2020 1144   PHURINE 6.0 07/16/2020 1144   GLUCOSEU NEGATIVE 07/16/2020 1144   HGBUR LARGE (A) 07/16/2020 1144   BILIRUBINUR NEGATIVE 07/16/2020 1144   KETONESUR NEGATIVE 07/16/2020 1144   PROTEINUR 100 (A) 07/16/2020 1144   NITRITE NEGATIVE 07/16/2020 1144   LEUKOCYTESUR LARGE (A) 07/16/2020 1144   Sepsis Labs: @LABRCNTIP (procalcitonin:4,lacticidven:4)  ) Recent Results (from the past 240 hour(s))  SARS CORONAVIRUS 2 (TAT 6-24 HRS) Nasopharyngeal Nasopharyngeal Swab     Status: None   Collection Time: 07/11/20  5:53 PM   Specimen: Nasopharyngeal Swab  Result Value Ref Range Status   SARS Coronavirus 2 NEGATIVE NEGATIVE Final    Comment: (NOTE) SARS-CoV-2 target nucleic acids are NOT DETECTED.  The SARS-CoV-2 RNA is generally detectable in upper and lower respiratory specimens during the acute phase of infection. Negative results do not preclude SARS-CoV-2 infection, do not rule out co-infections with other pathogens, and should not be used as the sole basis for treatment or other patient management  decisions. Negative results must be combined with clinical observations, patient history, and epidemiological information. The expected result is Negative.  Fact Sheet for Patients: SugarRoll.be  Fact Sheet for Healthcare Providers: https://www.woods-mathews.com/  This test is not yet approved or cleared by the [place] FDA and  has been authorized for detection and/or diagnosis of SARS-CoV-2 by FDA under an Emergency Use Authorization (EUA). This EUA will remain  in effect (meaning this test can be used) for the duration of the COVID-19 declaration under Se ction 564(b)(1) of the Act, 21 U.S.C. section 360bbb-3(b)(1), unless the authorization is terminated or revoked sooner.  Performed at [facility], [place] [address] [place], [place] [address]   MRSA PCR Screening     Status: None   Collection Time: 07/11/20  6:22 PM   Specimen: Nasal Mucosa; Nasopharyngeal  Result Value Ref Range Status   MRSA by PCR NEGATIVE NEGATIVE Final    Comment:        The GeneXpert MRSA Assay (FDA approved for NASAL specimens only), is one component of a comprehensive MRSA colonization surveillance program. It is not intended to diagnose MRSA infection nor to guide or monitor treatment for MRSA infections. Performed at [facility], [place] [address], [place], [place] [address]   Culture, Urine     Status: Abnormal   Collection Time: 07/15/20  7:04 PM   Specimen: Urine, Catheterized  Result Value Ref Range Status   Specimen Description URINE, CATHETERIZED  Final   Special Requests   Final    NONE Performed at [facility], [address], [place], [place] [address]    Culture >=100,000 COLONIES/mL ESCHERICHIA COLI (A)  Final   Report Status 07/18/2020 FINAL  Final   Organism ID, Bacteria ESCHERICHIA COLI (A)  Final      Susceptibility   Escherichia coli - MIC*    AMPICILLIN <=2 SENSITIVE Sensitive     CEFAZOLIN <=4 SENSITIVE  Sensitive     CEFEPIME <=0.12 SENSITIVE Sensitive     CEFTRIAXONE <=0.25 SENSITIVE Sensitive     CIPROFLOXACIN <=0.25 SENSITIVE Sensitive     GENTAMICIN <=1 SENSITIVE Sensitive     IMIPENEM <=0.25 SENSITIVE Sensitive     NITROFURANTOIN <=16 SENSITIVE Sensitive     TRIMETH/SULFA <=20 SENSITIVE Sensitive     AMPICILLIN/SULBACTAM <=2 SENSITIVE Sensitive     PIP/TAZO <=4 SENSITIVE Sensitive     * >=100,000 COLONIES/mL ESCHERICHIA COLI  Culture,  blood (routine x 2)     Status: None (Preliminary result)   Collection Time: 07/16/20  8:33 AM   Specimen: BLOOD LEFT HAND  Result Value Ref Range Status   Specimen Description BLOOD LEFT HAND  Final   Special Requests   Final    BOTTLES DRAWN AEROBIC AND ANAEROBIC Blood Culture adequate volume   Culture   Final    NO GROWTH 4 DAYS Performed at [facility], [address] [place], [place] [address]    Report Status PENDING  Incomplete  Culture, blood (routine x 2)     Status: None (Preliminary result)   Collection Time: 07/16/20  8:39 AM   Specimen: BLOOD RIGHT WRIST  Result Value Ref Range Status   Specimen Description BLOOD RIGHT WRIST  Final   Special Requests   Final    BOTTLES DRAWN AEROBIC AND ANAEROBIC Blood Culture adequate volume   Culture   Final    NO GROWTH 4 DAYS Performed at [facility], [place] [address] [place], [place] [address]    Report Status PENDING  Incomplete      Radiology Studies: No results found.   Scheduled Meds: . aspirin EC  81 mg Oral Daily  . cephALEXin  250 mg Oral Q12H  . mouth rinse  15 mL Mouth Rinse BID  . mirtazapine  15 mg Oral QHS   Continuous Infusions: . lactated ringers    . lactated ringers       LOS: 9 days   Time Spent in minutes   15 minutes  Sanjay Broadfoot D.O. on 07/20/2020 at 11:31 AM  Between 7am to 7pm - Please see pager noted on [email or web address]  After 7pm go to [email or web address]  And look for the night coverage person covering for me after hours  [facility] Office  [phone]

## 2020-07-20 NOTE — Progress Notes (Addendum)
STROKE TEAM PROGRESS NOTE   INTERVAL HISTORY Remains afebrile. Vital signs stable.   She is sitting up in the chair, resting. No visitors at bedside. She denies any new concerns or symptoms. She is on comfort care measures per yesterdays discussion with Palliative care.  She understands that she may leave on Tuesday to [facility]. They are planning on resuming hospice care once settled at SNF.   Vitals:   07/17/20 2258 07/18/20 2026 07/19/20 1449 07/20/20 0410  BP: 122/66 (!) 126/55 (!) 142/69 (!) 162/88  Pulse: 93 70 97 98  Resp: 16 20 18 18   Temp: 97.9 F (36.6 C) 98.4 F (36.9 C) 98.1 F (36.7 C) 98.4 F (36.9 C)  TempSrc:  Oral Oral Oral  SpO2: 100% 94% 95% 96%  Weight:      Height:       CBC:  Recent Labs  Lab 07/15/20 1502 07/16/20 0833  WBC 17.9* 8.1  HGB 12.6 13.2  HCT 39.1 40.9  MCV 91.8 91.7  PLT 172 409   Basic Metabolic Panel:  Recent Labs  Lab 07/15/20 1502 07/16/20 0833  NA 136 137  K 4.0 3.5  CL 103 105  CO2 22 24  GLUCOSE 147* 93  BUN 17 21  CREATININE 1.03* 0.98  CALCIUM 8.9 9.1   Lipid Panel:  No results for input(s): CHOL, TRIG, HDL, CHOLHDL, VLDL, LDLCALC in the last 168 hours. HgbA1c:  No results for input(s): HGBA1C in the last 168 hours. Urine Drug Screen: No results for input(s): LABOPIA, COCAINSCRNUR, LABBENZ, AMPHETMU, THCU, LABBARB in the last 168 hours.  Alcohol Level No results for input(s): ETH in the last 168 hours.  IMAGING  1. Occlusion left P2/P3 segment likely acute 2. Moderate to severe stenosis left M3 segment supplying the left frontal parietal lobe. 3. Advanced atherosclerotic disease aortic arch and proximal left subclavian artery 4. Mild atherosclerotic disease in the carotid bifurcation bilaterally. Moderate stenosis in the cavernous carotid bilaterally due to atherosclerotic disease. 5. Moderate to severe stenosis distal left vertebral artery. 6. These results were called by telephone at the time  of interpretation on 07/11/2020 at 12:49 pm to provider [facility] , who verbally acknowledged these results  CXray 1.  Cardiomegaly.  Mild pulmonary venous congestion. 2. Mild bilateral interstitial prominence. Mild interstitial edema and/or pneumonitis can not be excluded. Tiny left pleural effusion cannot excluded.  PHYSICAL EXAM General awake alert in no distress HEENT: Normocephalic/atraumatic Resp: No extra work of breathing  Cardiovascular: Irregularly irregular Extremities warm well perfused Neurological exam She is alert today. Conversant with speech that is hesitant with occasional errors. sentences today.  She can follow simple one to two-step commands. She is able to name and repeat. Cranial nerves: Pupils equal round reactive light, left gaze preference, is able to come to midline but not look to the right consistently, does not blink to threat from the right, mild right lower facial asymmetry. Motor exam: Right upper extremity weakness but able to lift off the bed and perform arm roll and RAMs with impaired speed and accuracy. 3/5 right lower extremity.  Left upper 4/5.  Left lower 4/5. Sensory exam: appears intact  ASSESSMENT/PLAN  Sheryl Suarez is a 87 y.o. female past medical of atrial fibrillation not on anticoagulation, CHF, coronary artery disease, hypertension, history of breast cancer now in remission, at some point on hospice but taken off of hospice because of improvement after medication adjustment, brought into the emergency room for evaluation of sudden onset of  right-sided weakness and inability to talk. tPA was administered.   Left hemispheric embolic infarct on atrial fibrillation and is not on long-term anticoagulation due to history of frequent falls  CT head no acute abnormality   CTA head & neck: Occlusion left P2/P3 segment likely acute,  Moderate to severe stenosis left M3 segment supplying the left frontal parietal lobe, Advanced atherosclerotic  disease aortic arch and proximal left subclavian artery, Mild atherosclerotic disease in the carotid bifurcation bilaterally. Moderate stenosis in the cavernous carotid bilaterally due to atherosclerotic disease, Moderate to severe stenosis distal left vertebral artery.   MRI acute left MCA infarct involving posterior insula and parietal operculum.  No hemorrhage  2D Echo EF 50%, Left atrial size was severely dilated, Right atrial size was mildly dilated, mild pulm HTN, No shunt, thrombus or wall motion abnormality  LDL 74  HgbA1c 5.3  VTE prophylaxis - SCDs, lovenox    Diet   Diet regular Room service appropriate? Yes; Fluid consistency: Thin  Consider Eliquis start on 5th day post event (5/26) but not started in palliative setting where individualized decisions are being made on each element of treatment plan.  On ASA 81mg  for the present. Per conversation with daughter, Sheryl Suarez, they do not want to anticoagulation and will plan to involve hospice again once she gets settled at [facility].   Therapy recommendations:  SNF bed offer at [facility] for Tuesday. ALF has declined to for patient to return per Sheryl Suarez, daughter.   Disposition: TBD  Chronic atrial fibrillation  Prior rate controlled on cardizem discontinued on (5/26)  Patient & son agreeable to Eliquis   Eliquis started (5/26) only received 1 dose  Sepsis/E-coli UTI  SIRS criteria in this patient includes: Leukocytosis, fever, tachycardia  Lactate 2.9->3.1  Procalcitonin 38.2  Sepsis protocol  UA (5/26): LE shows E coli   Blood culture (5/26) negative 5/27.   Switched from IV antibiotic to keflex po (5/27)  Medical team on board   Hypertension  Initially required cleviprex infusion  . Long-term BP goal normotensive  Hyperlipidemia  Lipitor 10mg  discontinue (5/26)   LDL 74, not quite at goal < 70  High intensity statin not warranted as essentially at goal   Continue statin at discharge  Other Stroke  Risk Factors  Advanced Age >/= 65    Coronary artery disease  Goals of Care  Palliative following: continue supportive care with antibiotics and IVF if needed, now DNR in semi-comfort care  Other active problems  Urinary retension: foley placed by urology (5/25). [facility] contacted for hospital follow up for foley removal in about 5-7 days. However if patient goes to SNF will cancel.   [name], ACNP-BC Stroke NP 07/20/2020 12:34 PM  ATTENDING NOTE: I reviewed above note and agree with the assessment and plan. Pt was seen and examined.   No acute event overnight, neuro stable, still has mild expressive aphasia.  On aspirin, no escalation of care at this time.  Hospice care after patient settled down in SNF.  Pending SNF, likely tomorrow.  For detailed assessment and plan, please refer to above as I have made changes wherever appropriate.   Rosalin Hawking, MD PhD Stroke Neurology 07/20/2020 1:21 PM

## 2020-07-21 DIAGNOSIS — Z515 Encounter for palliative care: Secondary | ICD-10-CM | POA: Diagnosis not present

## 2020-07-21 DIAGNOSIS — Z66 Do not resuscitate: Secondary | ICD-10-CM | POA: Diagnosis not present

## 2020-07-21 DIAGNOSIS — Z7189 Other specified counseling: Secondary | ICD-10-CM | POA: Diagnosis not present

## 2020-07-21 DIAGNOSIS — I639 Cerebral infarction, unspecified: Secondary | ICD-10-CM | POA: Diagnosis not present

## 2020-07-21 DIAGNOSIS — I482 Chronic atrial fibrillation, unspecified: Secondary | ICD-10-CM | POA: Diagnosis not present

## 2020-07-21 DIAGNOSIS — I10 Essential (primary) hypertension: Secondary | ICD-10-CM | POA: Diagnosis not present

## 2020-07-21 LAB — CULTURE, BLOOD (ROUTINE X 2)
Culture: NO GROWTH
Culture: NO GROWTH
Special Requests: ADEQUATE
Special Requests: ADEQUATE

## 2020-07-21 NOTE — TOC Progression Note (Signed)
Transition of Care [facility]) - Progression Note    Patient Details  Name: Sheryl Suarez MRN: [id number] Date of Birth: 05-29-1923  Transition of Care [facility]) CM/SW [place], [place] Phone Number: 07/21/2020, 12:35 PM  Clinical Narrative:     CSW received message that pts daughter would like for pt to go to [facility]. CSW reached out to [facility] and they believe they will have  Bed for pt on Thursday. Pt will need covid test on Wednesday. Pt will not need insurance auth.   Expected Discharge Plan: [place] Barriers to Discharge: Continued Medical Work up  Expected Discharge Plan and Services Expected Discharge Plan: [place] Choice: [place] arrangements for the past 2 months: [place]                                       Social Determinants of Health (SDOH) Interventions    Readmission Risk Interventions No flowsheet data found.  Emeterio Reeve, Latanya Presser, [place] Social Worker [phone]

## 2020-07-21 NOTE — Progress Notes (Signed)
Palliative Medicine Inpatient Follow Up Note  Reason for consult:  End of Life Care  HPI:  Per intake H&P --> Sheryl Suarez an 87 y.o.femalewith h/o HTN; CAD; chronic systolic CHF; breast cancer; and afib who presented with CVA on 5/21.The patient has mild dysarthria and word finding difficulties.   She suffered from severe sepsis in the setting of a UTI.  She had been made comfort oriented care by the hospitalist team in the setting of these events with the continuation of antibiotics and IV fluids.  Palliative care has been asked to get involved to further address end-of-life care and disposition needs.  Today's Discussion (07/21/2020):  *Please note that this is a verbal dictation therefore any spelling or grammatical errors are due to the "[employer]" system interpretation.  Chart reviewed.   I met with [name] at bedside this morning after receiving a brief update from her bedside nurse.  Per review with nursing [name] has incrementally been frustrating with her expressive aphasia.  Otherwise she has been appropriate with nursing staff and able to participate.  I assessed Sheryl Suarez at bedside and she was awake alert and made aware that we were planning to transition her to a rehabilitation today.  She expresses that she feels "an increase in strength" as compared to the days prior.  Sheryl Suarez goes on to share with me that she is 87 years old and has lived a wonderful life.  She shares with me that many of her friends have passed away and that she is ready when God is ready to take her.  We discussed the plan to try rehabilitation and if this is not successful to transition our focus to providing symptom relief under hospice care.  Questions and concerns addressed   Objective Assessment: Vital Signs Vitals:   07/20/20 0410 07/21/20 0345  BP: (!) 162/88 (!) 146/96  Pulse: 98 (!) 101  Resp: 18 17  Temp: 98.4 F (36.9 C) (!) 97.3 F (36.3 C)  SpO2: 96% 96%    Intake/Output  Summary (Last 24 hours) at 07/21/2020 1051 Last data filed at 07/21/2020 0900 Gross per 24 hour  Intake 685 ml  Output 1800 ml  Net -1115 ml   Last Weight  Most recent update: 07/11/2020  1:10 PM   Weight  54.8 kg (120 lb 13 oz)           Gen: Elderly Caucasian female in no acute distress HEENT: moist mucous membranes CV: Regular rate and rhythm PULM: On 2 L/min ABD: soft/nontender EXT: No edema Neuro: Alert and oriented to person  SUMMARY OF RECOMMENDATIONS DNAR/DNI  MOST Completed, paper copy placed onto the chart electric copy can be found in Vyn[employer]  DNR Form Completed, paper copy placed onto the chart electric copy can be found in Vyn[employer]  Continue antibiotics and  IV fluids  Comfort meds per [facility]  Goal: Transition to [facility] on Tuesday.  Ideally patient would be compliant with physical therapy though if not she will transition to hospice care.  Going incremental palliative care support  Time Spent: 25 Greater than 50% of the time was spent in counseling and coordination of care ______________________________________________________________________________________ [place] Team Team Cell Phone: [phone] Please utilize secure chat with additional questions, if there is no response within 30 minutes please call the above phone number  Palliative Medicine Team providers are available by phone from 7am to 7pm daily and can be reached through the team cell phone.  Should this patient require assistance outside of these hours, please call the patient's attending physician.     

## 2020-07-21 NOTE — Progress Notes (Addendum)
STROKE TEAM PROGRESS NOTE   INTERVAL HISTORY  Afebrile, VSS.  She is sitting up in the chair, resting. No visitors at bedside. She denies any new concerns or symptoms  No acute event overnight, neuro stable, still has mild expressive aphasia.  On aspirin, no escalation of care at this time.  Pending SNF; likely bed available at [facility] on Thursday.  Pt will need covid test on Wednesday.  Palliative care spoke with patient today.  They discussed the plan to try rehabilitation and if this is not successful to transition the focus to providing symptom relief under hospice care.   Vitals:   07/18/20 2026 07/19/20 1449 07/20/20 0410 07/21/20 0345  BP: (!) 126/55 (!) 142/69 (!) 162/88 (!) 146/96  Pulse: 70 97 98 (!) 101  Resp: 20 18 18 17   Temp: 98.4 F (36.9 C) 98.1 F (36.7 C) 98.4 F (36.9 C) (!) 97.3 F (36.3 C)  TempSrc: Oral Oral Oral Oral  SpO2: 94% 95% 96% 96%  Weight:      Height:       CBC:  Recent Labs  Lab 07/15/20 1502 07/16/20 0833  WBC 17.9* 8.1  HGB 12.6 13.2  HCT 39.1 40.9  MCV 91.8 91.7  PLT 172 353   Basic Metabolic Panel:  Recent Labs  Lab 07/15/20 1502 07/16/20 0833  NA 136 137  K 4.0 3.5  CL 103 105  CO2 22 24  GLUCOSE 147* 93  BUN 17 21  CREATININE 1.03* 0.98  CALCIUM 8.9 9.1   Lipid Panel:  No results for input(s): CHOL, TRIG, HDL, CHOLHDL, VLDL, LDLCALC in the last 168 hours. HgbA1c:  No results for input(s): HGBA1C in the last 168 hours. Urine Drug Screen: No results for input(s): LABOPIA, COCAINSCRNUR, LABBENZ, AMPHETMU, THCU, LABBARB in the last 168 hours.  Alcohol Level No results for input(s): ETH in the last 168 hours.  IMAGING  CTA head/neck Result date: 07/11/2020 Impression 1. Occlusion left P2/P3 segment likely acute 2. Moderate to severe stenosis left M3 segment supplying the left frontal parietal lobe. 3. Advanced atherosclerotic disease aortic arch and proximal left subclavian artery 4. Mild atherosclerotic  disease in the carotid bifurcation bilaterally. Moderate stenosis in the cavernous carotid bilaterally due to atherosclerotic disease. 5. Moderate to severe stenosis distal left vertebral artery. 6. These results were called by telephone at the time of interpretation on 07/11/2020 at 12:49 pm to provider [facility] , who verbally acknowledged these results  CXray 1.  Cardiomegaly.  Mild pulmonary venous congestion. 2. Mild bilateral interstitial prominence. Mild interstitial edema and/or pneumonitis can not be excluded. Tiny left pleural effusion cannot excluded.  PHYSICAL EXAM General awake alert in no distress HEENT: Normocephalic/atraumatic Resp: No extra work of breathing  Cardiovascular: Irregularly irregular Extremities warm well perfused Neurological exam She is alert today. Mild expressive aphasia. Conversant with speech that is hesitant with occasional errors. She can follow simple one to two-step commands. She is able to name and repeat. Cranial nerves: Pupils equal round reactive light, left gaze preference, is able to come to midline but not look to the right consistently, does not blink to threat from the right, mild right lower facial asymmetry. Motor exam: Right upper extremity weakness but able to lift off the bed and perform arm roll and RAMs with impaired speed and accuracy. 3/5 right lower extremity.  Left upper 4/5.  Left lower 4/5. Sensory exam: appears intact  ASSESSMENT/PLAN  Sheryl Suarez is a 87 y.o. female past medical of  atrial fibrillation not on anticoagulation, CHF, coronary artery disease, hypertension, history of breast cancer now in remission, at some point on hospice but taken off of hospice because of improvement after medication adjustment, brought into the emergency room for evaluation of sudden onset of right-sided weakness and inability to talk. tPA was administered.   Left hemispheric embolic infarct on atrial fibrillation and is not on long-term  anticoagulation due to history of frequent falls  CT head no acute abnormality   CTA head & neck: Occlusion left P2/P3 segment likely acute,  Moderate to severe stenosis left M3 segment supplying the left frontal parietal lobe, Advanced atherosclerotic disease aortic arch and proximal left subclavian artery, Mild atherosclerotic disease in the carotid bifurcation bilaterally. Moderate stenosis in the cavernous carotid bilaterally due to atherosclerotic disease, Moderate to severe stenosis distal left vertebral artery.   MRI acute left MCA infarct involving posterior insula and parietal operculum.  No hemorrhage  2D Echo EF 50%, Left atrial size was severely dilated, Right atrial size was mildly dilated, mild pulm HTN, No shunt, thrombus or wall motion abnormality  LDL 74  HgbA1c 5.3  VTE prophylaxis - SCDs, lovenox    Diet   Diet regular Room service appropriate? Yes; Fluid consistency: Thin  Consider Eliquis start on 5th day post event (5/26) but not started in palliative setting where individualized decisions are being made on each element of treatment plan.  On ASA 81mg  for the present. Per conversation with daughter, Sheryl Suarez, they do not want to anticoagulation and will plan to involve hospice again once she gets settled at [facility].   Therapy recommendations:  PT/OT/ST reconsulted. SNF bed offer at [facility] for Thursday. ALF has declined to for patient to return per Sheryl Suarez, daughter.   Disposition: TBD  Chronic atrial fibrillation  Prior rate controlled on cardizem discontinued on (5/26)  Patient & son agreeable to Eliquis   Eliquis started (5/26) only received 1 dose  Sepsis/E-coli UTI  SIRS criteria in this patient includes: Leukocytosis, fever, tachycardia  Lactate 2.9->3.1  Procalcitonin 38.2  Sepsis protocol  UA (5/26): LE shows E coli   Blood culture (5/26) negative 5/27.   Switched from IV antibiotic to keflex po (5/27) end date 6/1  Medical team on board    Hypertension  Initially required cleviprex infusion  . Long-term BP goal normotensive  Hyperlipidemia  Lipitor 10mg  discontinue (5/26)   LDL 74, not quite at goal < 70  High intensity statin not warranted as essentially at goal   Continue statin at discharge  Other Stroke Risk Factors  Advanced Age >/= 65    Coronary artery disease  Goals of Care  Palliative following: continue supportive care with antibiotics and IVF if needed, now DNR in semi-comfort care  Other active problems  Urinary retension: foley placed by urology (5/25). [place] [facility] contacted for hospital follow up for foley removal in about 5-7 days. However if patient goes to SNF will cancel.   [name], ACNP-BC Stroke NP 07/21/2020 2:16 PM   ATTENDING NOTE: I reviewed above note and agree with the assessment and plan. Pt was seen and examined.   No acute event overnight, patient neuro stable, unchanged.  Palliative care on board, family now decided to go for rehab first, if no improvement may consider hospice at that time.  We will continue aspirin, no escalation of antithrombotic treatment.  Pending SNF.  For detailed assessment and plan, please refer to above as I have made changes wherever appropriate.   Jassmin Kemmerer  Erlinda Hong, MD PhD Stroke Neurology 07/21/2020 7:47 PM

## 2020-07-21 NOTE — Progress Notes (Addendum)
PROGRESS NOTE    Sheryl Suarez  WCB:[id number] DOB: 06-18-1923 DOA: 07/11/2020 PCP: Leonel Ramsay, MD   Brief Narrative:  87 year old female with history of hypertension, chronic systolic heart failure, breast cancer, CAD, atrial fibrillation presented with acute CVA on 5/21.  Admitted by neurology service.  TRH was consulted for sepsis.  Patient appears to have UTI and currently being treated with cefepime.  Seems the patient was also transition to comfort measures however family would like to continue IV fluids and antibiotics.  Palliative care consulted and pending discharge back to ALF.  TRH will sign off. Please let us know if we can be of further assistance.  Assessment & Plan   Acute CVA -Managed by neurology -Likely embolic given her history of atrial fibrillation and was not on long-term anticoagulation due to history of frequent falls -Noted to have an acute left MCA infarct on 5/22 -PT and OT have recommended SNF -Speech therapy working with patient and recommended dysphagia 3 nectar thick diet- transitioned to regular diet -was on aspirin and Eliquis, statin- however discontinued as patient has been transitioned to comfort   Sepsis likely secondary to UTI -Patient noted to have leukocytosis, fever and tachycardia -UA showed few bacteria, >50 WBC, negative nitrites, large leukocytes -Urine culture shows >100K E. Coli- pansensitive  -Blood cultures show no growth to date -was on Unasyn for urine culture results from 2021, but also received a dose of ceftriaxone -would recommend keflex 250 BID x 5 days- stop date 07/22/2020  Essential hypertension -Initially required Cleviprex infusion -was on diltiazem  Hyperlipidemia -was on statin  Atrial fibrillation -Currently rate controlled -Eliquis and cardizem discontinued due to comfort measures  Chronic systolic heart failure -Currently appears to be compensated.  Patient does have lower extremity edema however no  shortness of breath -Home Lasix currently held -Monitor intake and output and weights  History of breast cancer -Status post bilateral vasectomy  Urinary retention -Foley catheter placed by urology on 5/25  Goals of care -Previous hospitalist discussed goals of care with patient's daughter and feel they would like for her to be comfort care however would like to continue IV fluids as well as antibiotics -Palliative care consulted and appreciated -Currently DNR -Pending discharge to ALF with comfort measures  DVT Prophylaxis Comfort care  Code Status: DNR  Family Communication: None at bedside  Disposition Plan:  Status is: Inpatient  Remains inpatient appropriate because:IV treatments appropriate due to intensity of illness or inability to take PO and Inpatient level of care appropriate due to severity of illness   Dispo: The patient is from: ALF              Anticipated d/c is to: TBD              Patient currently is not medically stable to d/c.   Difficult to place patient No   Consultants [facility] Urology Palliative care  Procedures  Echocardiogram  Antibiotics   Anti-infectives (From admission, onward)   Start     Dose/Rate Route Frequency Ordered Stop   07/17/20 1500  cephALEXin (KEFLEX) capsule 250 mg        250 mg Oral Every 12 hours 07/17/20 1405 07/22/20 0959   07/17/20 1200  ceFEPIme (MAXIPIME) 1 g in sodium chloride 0.9 % 100 mL IVPB  Status:  Discontinued        1 g 200 mL/hr over 30 Minutes Intravenous Every 24 hours 07/16/20 1111 07/16/20 1137   07/16/20 1200  ceFEPIme (  MAXIPIME) 2 g in sodium chloride 0.9 % 100 mL IVPB  Status:  Discontinued        2 g 200 mL/hr over 30 Minutes Intravenous  Once 07/16/20 1106 07/16/20 1137   07/16/20 1200  Ampicillin-Sulbactam (UNASYN) 3 g in sodium chloride 0.9 % 100 mL IVPB  Status:  Discontinued        3 g 200 mL/hr over 30 Minutes Intravenous Every 12 hours 07/16/20 1137 07/17/20 1405   07/16/20 0900  cefTRIAXone  (ROCEPHIN) 1 g in sodium chloride 0.9 % 100 mL IVPB        1 g 200 mL/hr over 30 Minutes Intravenous  Once 07/16/20 0814 07/16/20 1856      Subjective:   Sheryl Suarez seen and examined today.  Patient with no complaints this morning.   Objective:   Vitals:   07/18/20 2026 07/19/20 1449 07/20/20 0410 07/21/20 0345  BP: (!) 126/55 (!) 142/69 (!) 162/88 (!) 146/96  Pulse: 70 97 98 (!) 101  Resp: 20 18 18 17   Temp: 98.4 F (36.9 C) 98.1 F (36.7 C) 98.4 F (36.9 C) (!) 97.3 F (36.3 C)  TempSrc: Oral Oral Oral Oral  SpO2: 94% 95% 96% 96%  Weight:      Height:        Intake/Output Summary (Last 24 hours) at 07/21/2020 1027 Last data filed at 07/21/2020 0900 Gross per 24 hour  Intake 685 ml  Output 1800 ml  Net -1115 ml   Filed Weights   07/11/20 1200 07/11/20 1230  Weight: 54.8 kg 54.8 kg   Exam  General: Well developed, elderly, chronically ill-appearing, NAD  HEENT: NCAT, mucous membranes moist.   Neuro: AAOx1, tremor, no new deficits   Data Reviewed: I have personally reviewed following labs and imaging studies  CBC: Recent Labs  Lab 07/15/20 1502 07/16/20 0833  WBC 17.9* 8.1  HGB 12.6 13.2  HCT 39.1 40.9  MCV 91.8 91.7  PLT 172 314   Basic Metabolic Panel: Recent Labs  Lab 07/15/20 1502 07/16/20 0833  NA 136 137  K 4.0 3.5  CL 103 105  CO2 22 24  GLUCOSE 147* 93  BUN 17 21  CREATININE 1.03* 0.98  CALCIUM 8.9 9.1   GFR: Estimated Creatinine Clearance: 27.2 mL/min (by C-G formula based on SCr of 0.98 mg/dL). Liver Function Tests: No results for input(s): AST, ALT, ALKPHOS, BILITOT, PROT, ALBUMIN in the last 168 hours. No results for input(s): LIPASE, AMYLASE in the last 168 hours. No results for input(s): AMMONIA in the last 168 hours. Coagulation Profile: Recent Labs  Lab 07/16/20 1123  INR 1.3*   Cardiac Enzymes: No results for input(s): CKTOTAL, CKMB, CKMBINDEX, TROPONINI in the last 168 hours. BNP (last 3 results) No results for  input(s): PROBNP in the last 8760 hours. HbA1C: No results for input(s): HGBA1C in the last 72 hours. CBG: Recent Labs  Lab 07/15/20 1140  GLUCAP 154*   Lipid Profile: No results for input(s): CHOL, HDL, LDLCALC, TRIG, CHOLHDL, LDLDIRECT in the last 72 hours. Thyroid Function Tests: No results for input(s): TSH, T4TOTAL, FREET4, T3FREE, THYROIDAB in the last 72 hours. Anemia Panel: No results for input(s): VITAMINB12, FOLATE, FERRITIN, TIBC, IRON, RETICCTPCT in the last 72 hours. Urine analysis:    Component Value Date/Time   COLORURINE AMBER (A) 07/16/2020 1144   APPEARANCEUR HAZY (A) 07/16/2020 1144   LABSPEC 1.014 07/16/2020 1144   PHURINE 6.0 07/16/2020 1144   GLUCOSEU NEGATIVE 07/16/2020 1144   HGBUR LARGE (  A) 07/16/2020 1144   BILIRUBINUR NEGATIVE 07/16/2020 1144   KETONESUR NEGATIVE 07/16/2020 1144   PROTEINUR 100 (A) 07/16/2020 1144   NITRITE NEGATIVE 07/16/2020 1144   LEUKOCYTESUR LARGE (A) 07/16/2020 1144   Sepsis Labs: @LABRCNTIP (procalcitonin:4,lacticidven:4)  ) Recent Results (from the past 240 hour(s))  SARS CORONAVIRUS 2 (TAT 6-24 HRS) Nasopharyngeal Nasopharyngeal Swab     Status: None   Collection Time: 07/11/20  5:53 PM   Specimen: Nasopharyngeal Swab  Result Value Ref Range Status   SARS Coronavirus 2 NEGATIVE NEGATIVE Final    Comment: (NOTE) SARS-CoV-2 target nucleic acids are NOT DETECTED.  The SARS-CoV-2 RNA is generally detectable in upper and lower respiratory specimens during the acute phase of infection. Negative results do not preclude SARS-CoV-2 infection, do not rule out co-infections with other pathogens, and should not be used as the sole basis for treatment or other patient management decisions. Negative results must be combined with clinical observations, patient history, and epidemiological information. The expected result is Negative.  Fact Sheet for Patients: SugarRoll.be  Fact Sheet for  Healthcare Providers: https://www.woods-mathews.com/  This test is not yet approved or cleared by the [place] FDA and  has been authorized for detection and/or diagnosis of SARS-CoV-2 by FDA under an Emergency Use Authorization (EUA). This EUA will remain  in effect (meaning this test can be used) for the duration of the COVID-19 declaration under Se ction 564(b)(1) of the Act, 21 U.S.C. section 360bbb-3(b)(1), unless the authorization is terminated or revoked sooner.  Performed at [facility], [place] [address] [place], [place] [address]   MRSA PCR Screening     Status: None   Collection Time: 07/11/20  6:22 PM   Specimen: Nasal Mucosa; Nasopharyngeal  Result Value Ref Range Status   MRSA by PCR NEGATIVE NEGATIVE Final    Comment:        The GeneXpert MRSA Assay (FDA approved for NASAL specimens only), is one component of a comprehensive MRSA colonization surveillance program. It is not intended to diagnose MRSA infection nor to guide or monitor treatment for MRSA infections. Performed at [facility], [place] [address], [place], [place] [address]   Culture, Urine     Status: Abnormal   Collection Time: 07/15/20  7:04 PM   Specimen: Urine, Catheterized  Result Value Ref Range Status   Specimen Description URINE, CATHETERIZED  Final   Special Requests   Final    NONE Performed at [facility], [address], [place], [place] [address]    Culture >=100,000 COLONIES/mL ESCHERICHIA COLI (A)  Final   Report Status 07/18/2020 FINAL  Final   Organism ID, Bacteria ESCHERICHIA COLI (A)  Final      Susceptibility   Escherichia coli - MIC*    AMPICILLIN <=2 SENSITIVE Sensitive     CEFAZOLIN <=4 SENSITIVE Sensitive     CEFEPIME <=0.12 SENSITIVE Sensitive     CEFTRIAXONE <=0.25 SENSITIVE Sensitive     CIPROFLOXACIN <=0.25 SENSITIVE Sensitive     GENTAMICIN <=1 SENSITIVE Sensitive     IMIPENEM <=0.25 SENSITIVE Sensitive     NITROFURANTOIN <=16  SENSITIVE Sensitive     TRIMETH/SULFA <=20 SENSITIVE Sensitive     AMPICILLIN/SULBACTAM <=2 SENSITIVE Sensitive     PIP/TAZO <=4 SENSITIVE Sensitive     * >=100,000 COLONIES/mL ESCHERICHIA COLI  Culture, blood (routine x 2)     Status: None   Collection Time: 07/16/20  8:33 AM   Specimen: BLOOD LEFT HAND  Result Value  Ref Range Status   Specimen Description BLOOD LEFT HAND  Final   Special Requests   Final    BOTTLES DRAWN AEROBIC AND ANAEROBIC Blood Culture adequate volume   Culture   Final    NO GROWTH 5 DAYS Performed at [facility], [address], [place], [place] [address]    Report Status 07/21/2020 FINAL  Final  Culture, blood (routine x 2)     Status: None   Collection Time: 07/16/20  8:39 AM   Specimen: BLOOD RIGHT WRIST  Result Value Ref Range Status   Specimen Description BLOOD RIGHT WRIST  Final   Special Requests   Final    BOTTLES DRAWN AEROBIC AND ANAEROBIC Blood Culture adequate volume   Culture   Final    NO GROWTH 5 DAYS Performed at [facility], [place] [address], [place], [place] [address]    Report Status 07/21/2020 FINAL  Final      Radiology Studies: No results found.   Scheduled Meds: . aspirin EC  81 mg Oral Daily  . cephALEXin  250 mg Oral Q12H  . mouth rinse  15 mL Mouth Rinse BID  . mirtazapine  15 mg Oral QHS   Continuous Infusions: . lactated ringers    . lactated ringers       LOS: 10 days   Time Spent in minutes   15 minutes  Pearla Mckinny D.O. on 07/21/2020 at 10:27 AM  Between 7am to 7pm - Please see pager noted on [email or web address]  After 7pm go to [email or web address]  And look for the night coverage person covering for me after hours  [facility] Office  [phone]

## 2020-07-22 DIAGNOSIS — I639 Cerebral infarction, unspecified: Secondary | ICD-10-CM | POA: Diagnosis not present

## 2020-07-22 MED ORDER — ACETAMINOPHEN-CODEINE #3 300-30 MG PO TABS: 1.0000 | ORAL_TABLET | Freq: Four times a day (QID) | ORAL | Status: DC | PRN

## 2020-07-22 NOTE — Progress Notes (Signed)
TRH had signed off on 07/21/2020 but unfortunately patient remained on [facility] list.  I confirmed with Dr. Ree Kida that she did sign off yesterday and no need to see patient again.  She had informed the patient placement RN's to remove the patient from [facility] list but appears that that was not done.  I did not see the patient today.  I will remove patient from [facility] rounding list.  Kindly call for any further assistance.  Vernell Leep, MD, [place], [facility]. [facility]  To contact the attending provider between 7A-7P or the covering provider during after hours 7P-7A, please log into the web site [email or web address] and access using universal [place] password for that web site. If you do not have the password, please call the hospital operator.

## 2020-07-22 NOTE — Progress Notes (Signed)
Occupational Therapy Treatment Patient Details Name: Sheryl Suarez MRN: [id number] DOB: 1923-04-19 Today's Date: 07/22/2020    History of present illness 87 y.o. female who presented 5/21 with R-sided weakness and inability to talk. tPA was administered. MRI of head revealed acute L MCA infarct involving the posterior insula and parietal operculum. PMH: atrial fibrillation not on anticoagulation, CHF, CAD, HTN, and history of breast cancer now in remission.   OT comments  Pt making progress towards OT goals. This session pt was motivated to ambulate in the room, complete toileting and basic hygiene at the sink. All transfers, mobility, and ADL's performed this session required min guard for safety. Pt did not need any verbal safety cues and was able to complete all activities with no physical assist. Pt reporting that she feels stronger today than she has in a while. Acute OT will continue to follow to assist with further progressing pt towards her goals.    Follow Up Recommendations  SNF    Equipment Recommendations  3 in 1 bedside commode    Recommendations for Other Services      Precautions / Restrictions Precautions Precautions: Fall Restrictions Weight Bearing Restrictions: No       Mobility Bed Mobility Overal bed mobility: Modified Independent             General bed mobility comments: Increased time to complete supine to sit.    Transfers Overall transfer level: Needs assistance Equipment used: Rolling walker (2 wheeled) Transfers: Sit to/from Stand Sit to Stand: Min guard         General transfer comment: Pt completed x2 sit<>stands from bed in lowest height and toilet. Min guard for safety    Balance Overall balance assessment: Needs assistance Sitting-balance support: Feet supported Sitting balance-Leahy Scale: Good     Standing balance support: Bilateral upper extremity supported Standing balance-Leahy Scale: Fair Standing balance comment: Reliant  on UE support                           ADL either performed or assessed with clinical judgement   ADL Overall ADL's : Needs assistance/impaired     Grooming: Min guard;Standing Grooming Details (indicate cue type and reason): completed at sink                 Toilet Transfer: Min guard;Ambulation Toilet Transfer Details (indicate cue type and reason): Min guard for safety Toileting- Clothing Manipulation and Hygiene: Min guard;Sit to/from stand;Sitting/lateral lean Toileting - Clothing Manipulation Details (indicate cue type and reason): Pt required additional time for clothing management prior to toileting and to move gown to wipe in standing and benefitted from [place] guard for balance.     Functional mobility during ADLs: Min guard;Rolling walker General ADL Comments: Pt was very motivated to get OOB and ambulate in her room this session. Pt required min guard overall for safety.     Vision       Perception     Praxis      Cognition Arousal/Alertness: Awake/alert Behavior During Therapy: WFL for tasks assessed/performed Overall Cognitive Status: History of cognitive impairments - at baseline                                 General Comments: Memory deficits at baseline. Presenting with difficulty sequencing. Gets frustrated with expressive difficulty.        Exercises  Shoulder Instructions       General Comments VSS on RA    Pertinent Vitals/ Pain       Pain Assessment: No/denies pain Faces Pain Scale: No hurt  Home Living                                          Prior Functioning/Environment              Frequency  Min 2X/week        Progress Toward Goals  OT Goals(current goals can now be found in the care plan section)  Progress towards OT goals: Progressing toward goals  Acute Rehab OT Goals Patient Stated Goal: to go back to [facility] OT Goal Formulation: With patient Time For Goal  Achievement: 07/28/20 Potential to Achieve Goals: Fair ADL Goals Pt Will Perform Upper Body Dressing: sitting;with supervision Pt Will Perform Lower Body Dressing: sit to/from stand;with min assist Pt Will Transfer to Toilet: with supervision;ambulating;regular height toilet Pt Will Perform Toileting - Clothing Manipulation and hygiene: with supervision;sit to/from stand Additional ADL Goal #1: Pt will improve safety awareness to Min verbal cues during ADL tasks by discharge.  Plan Discharge plan remains appropriate    Co-evaluation                 AM-PAC OT "6 Clicks" Daily Activity     Outcome Measure   Help from another person eating meals?: A Little Help from another person taking care of personal grooming?: A Little Help from another person toileting, which includes using toliet, bedpan, or urinal?: A Little Help from another person bathing (including washing, rinsing, drying)?: A Lot Help from another person to put on and taking off regular upper body clothing?: A Little Help from another person to put on and taking off regular lower body clothing?: A Lot 6 Click Score: 16    End of Session Equipment Utilized During Treatment: Rolling walker  OT Visit Diagnosis: Unsteadiness on feet (R26.81);Muscle weakness (generalized) (M62.81);Other symptoms and signs involving cognitive function;Cognitive communication deficit (R41.841) Symptoms and signs involving cognitive functions: Cerebral infarction   Activity Tolerance Patient tolerated treatment well   Patient Left in chair;with call bell/phone within reach;with chair alarm set   Nurse Communication Mobility status        Time: 6269-4854 OT Time Calculation (min): 16 min  Charges: OT General Charges $OT Visit: 1 Visit OT Treatments $Self Care/Home Management : 8-22 mins  Mallory Schaad H., OTR/L Acute Rehabilitation  Skylin Kennerson Elane Lijah Bourque 07/22/2020, 2:05 PM

## 2020-07-22 NOTE — Progress Notes (Signed)
Physical Therapy Treatment Patient Details Name: Sheryl Suarez MRN: [id number] DOB: 1923/10/25 Today's Date: 07/22/2020    History of Present Illness 88 y.o. female who presented 5/21 with R-sided weakness and inability to talk. tPA was administered. MRI of head revealed acute L MCA infarct involving the posterior insula and parietal operculum. PMH: atrial fibrillation not on anticoagulation, CHF, CAD, HTN, and history of breast cancer now in remission.    PT Comments    Patient received up in recliner, daughter, [name] present for session. Patient is slightly agitated throughout session but agreeable. She requires cues for safe transfers and min assist. Ambulated 25 feet with RW and min guard. Performed sit to supine with mod independence. Bed exercises performed with cues. Patient will continue to benefit from skilled PT while here to improve functional independence and strength.     Follow Up Recommendations  Home health PT;Supervision/Assistance - 24 hour     Equipment Recommendations  None recommended by PT    Recommendations for Other Services       Precautions / Restrictions Precautions Precautions: Fall Restrictions Weight Bearing Restrictions: No    Mobility  Bed Mobility Overal bed mobility: Modified Independent Bed Mobility: Sit to Supine       Sit to supine: Modified independent (Device/Increase time)   General bed mobility comments: Increased time to complete supine to sit.    Transfers Overall transfer level: Needs assistance Equipment used: Rolling walker (2 wheeled) Transfers: Sit to/from Stand Sit to Stand: Min guard         General transfer comment: Pt completed x2 sit<>stands from bed in lowest height and toilet. Min guard for safety  Ambulation/Gait Ambulation/Gait assistance: Min guard Gait Distance (Feet): 25 Feet Assistive device: Rolling walker (2 wheeled) Gait Pattern/deviations: Step-through pattern;Trunk flexed;Narrow base of  support Gait velocity: Decreased   General Gait Details: Patient ambulated well, but fatigues quickly and wants to turn around.   Stairs             Wheelchair Mobility    Modified Rankin (Stroke Patients Only) Modified Rankin (Stroke Patients Only) Pre-Morbid Rankin Score: Moderately severe disability Modified Rankin: Moderately severe disability     Balance Overall balance assessment: Needs assistance Sitting-balance support: Feet supported Sitting balance-Leahy Scale: Good     Standing balance support: Bilateral upper extremity supported;During functional activity Standing balance-Leahy Scale: Fair Standing balance comment: Reliant on UE support                            Cognition Arousal/Alertness: Awake/alert Behavior During Therapy: WFL for tasks assessed/performed Overall Cognitive Status: History of cognitive impairments - at baseline                                 General Comments: Gets frustated/agitated at times      Exercises Other Exercises Other Exercises: Supine: ap, heel slides, SLR, hip abd/add x 6-10 reps each as able B    General Comments General comments (skin integrity, edema, etc.): VSS on RA      Pertinent Vitals/Pain Pain Assessment: Faces Faces Pain Scale: No hurt Pain Location: generalized Pain Descriptors / Indicators: Guarding;Grimacing;Moaning Pain Intervention(s): Monitored during session;Repositioned    Home Living                      Prior Function  PT Goals (current goals can now be found in the care plan section) Acute Rehab PT Goals Patient Stated Goal: to go back to [facility] PT Goal Formulation: With patient Time For Goal Achievement: 07/26/20 Potential to Achieve Goals: Good Progress towards PT goals: Progressing toward goals    Frequency    Min 3X/week      PT Plan Current plan remains appropriate    Co-evaluation              AM-PAC PT "6  Clicks" Mobility   Outcome Measure  Help needed turning from your back to your side while in a flat bed without using bedrails?: A Little Help needed moving from lying on your back to sitting on the side of a flat bed without using bedrails?: A Little Help needed moving to and from a bed to a chair (including a wheelchair)?: A Little Help needed standing up from a chair using your arms (e.g., wheelchair or bedside chair)?: A Little Help needed to walk in hospital room?: A Little Help needed climbing 3-5 steps with a railing? : A Lot 6 Click Score: 17    End of Session Equipment Utilized During Treatment: Gait belt Activity Tolerance: Patient tolerated treatment well;Patient limited by fatigue Patient left: in bed;with bed alarm set;with family/visitor present;with call bell/phone within reach Nurse Communication: Mobility status PT Visit Diagnosis: Other abnormalities of gait and mobility (R26.89);Muscle weakness (generalized) (M62.81) Hemiplegia - Right/Left: Right Hemiplegia - dominant/non-dominant: Dominant Hemiplegia - caused by: Cerebral infarction     Time: 1430-1450 PT Time Calculation (min) (ACUTE ONLY): 20 min  Charges:  $Therapeutic Exercise: 8-22 mins                     Griffyn Kucinski, PT, GCS 07/22/20,2:57 PM

## 2020-07-22 NOTE — Progress Notes (Signed)
  Speech Language Pathology Treatment: Dysphagia  Patient Details Name: Sheryl Suarez MRN: [id number] DOB: Nov 11, 1923 Today's Date: 07/22/2020 Time: 9233-0076 SLP Time Calculation (min) (ACUTE ONLY): 9 min  Assessment / Plan / Recommendation Clinical Impression  Received new order for cognitive-language assessment however ST has been following pt for swallow and cognition-communication since last week. At some point orders were discontinued and diet changed to regular/thin possibly if plan was complete comfort. Spoke with stroke NP and pt now going to SNF with modified comfort measures. She is likely having transient sensed aspiration when using straw as she immediately cough after each sip, mitigated with cup sips. No difficulty managing solid orally. Recommend she continue current diet, reg/thin with known risks with cup sips liquid versus straw. Overall, her mentation and alertness has significantly improved. She still has some trouble with dysnomia. ST will sign off at this time.   HPI HPI: Patient is a 87 y.o. female with PMH: atrial fibrillation not on anticoagulation, CHF, CAD, HTN, h/o breast cancer now in remission, was on hospice at some point but taken off hospice due to improved from mediation adjustment. She was brought to the ER from SNF for evaluation of new onset right-sided weakness and inability to talk. At baseline, reportedly patient is able to get around using walker and has conversations fairly normally per SNF staff report. CT head was negative for CVA, MRI Acute left MCA infarct involving the posterior insula and parietal  operculum      SLP Plan  All goals met;Discharge SLP treatment due to (comment)       Recommendations  Diet recommendations: Regular;Thin liquid Liquids provided via: Cup;No straw Medication Administration: Whole meds with puree Supervision: Patient able to self feed;Intermittent supervision to cue for compensatory strategies Compensations: Slow  rate;Small sips/bites Postural Changes and/or Swallow Maneuvers: Seated upright 90 degrees                Oral Care Recommendations: Oral care BID Follow up Recommendations: 24 hour supervision/assistance SLP Visit Diagnosis: Dysphagia, unspecified (R13.10) Plan: All goals met;Discharge SLP treatment due to (comment)       GO                Houston Siren 07/22/2020, 11:02 AM  Orbie Pyo Colvin Caroli.Ed Risk analyst [phone] Office [phone]

## 2020-07-22 NOTE — Progress Notes (Addendum)
STROKE TEAM PROGRESS NOTE   INTERVAL HISTORY  Afebrile, VSS. No change in neuro exam. Feeling well today. No complaints. She voices understanding that she is waiting to go to SNF for rehab.  Vitals:   07/19/20 1449 07/20/20 0410 07/21/20 0345 07/21/20 2035  BP: (!) 142/69 (!) 162/88 (!) 146/96 (!) 142/86  Pulse: 97 98 (!) 101 81  Resp: 18 18 17 16   Temp: 98.1 F (36.7 C) 98.4 F (36.9 C) (!) 97.3 F (36.3 C) 97.9 F (36.6 C)  TempSrc: Oral Oral Oral   SpO2: 95% 96% 96% 96%  Weight:      Height:       CBC:  Recent Labs  Lab 07/15/20 1502 07/16/20 0833  WBC 17.9* 8.1  HGB 12.6 13.2  HCT 39.1 40.9  MCV 91.8 91.7  PLT 172 400   Basic Metabolic Panel:  Recent Labs  Lab 07/15/20 1502 07/16/20 0833  NA 136 137  K 4.0 3.5  CL 103 105  CO2 22 24  GLUCOSE 147* 93  BUN 17 21  CREATININE 1.03* 0.98  CALCIUM 8.9 9.1   Lipid Panel:  No results for input(s): CHOL, TRIG, HDL, CHOLHDL, VLDL, LDLCALC in the last 168 hours. HgbA1c:  No results for input(s): HGBA1C in the last 168 hours. Urine Drug Screen: No results for input(s): LABOPIA, COCAINSCRNUR, LABBENZ, AMPHETMU, THCU, LABBARB in the last 168 hours.  Alcohol Level No results for input(s): ETH in the last 168 hours.  IMAGING  CTA head/neck Result date: 07/11/2020 Impression 1. Occlusion left P2/P3 segment likely acute 2. Moderate to severe stenosis left M3 segment supplying the left frontal parietal lobe. 3. Advanced atherosclerotic disease aortic arch and proximal left subclavian artery 4. Mild atherosclerotic disease in the carotid bifurcation bilaterally. Moderate stenosis in the cavernous carotid bilaterally due to atherosclerotic disease. 5. Moderate to severe stenosis distal left vertebral artery. 6. These results were called by telephone at the time of interpretation on 07/11/2020 at 12:49 pm to provider Sheryl Suarez , who verbally acknowledged these results  CXray 1.  Cardiomegaly.  Mild pulmonary  venous congestion. 2. Mild bilateral interstitial prominence. Mild interstitial edema and/or pneumonitis can not be excluded. Tiny left pleural effusion cannot excluded.  PHYSICAL EXAM General: Elderly female sitting up in chair in NAD HEENT: Normocephalic/atraumatic Resp: No extra work of breathing  Cardiovascular: Irregularly irregular Extremities warm well perfused Neurological exam Awake, fully alert and oriented. Conversant with some speech errors.  She can follow simple one to two-step commands. She is able to name and repeat. Cranial nerves: Pupils equal round reactive light, left gaze preference, is able to come to midline but not look to the right consistently, does not blink to threat from the right, mild right lower facial asymmetry. Motor exam: Right upper extremity weakness but able to lift off the bed and perform arm roll and RAMs with impaired speed and accuracy. 3/5 right lower extremity.  Left upper 4/5.  Left lower 4/5. Sensory exam: appears intact  ASSESSMENT/PLAN  Sheryl Suarez is a 87 y.o. female past medical of atrial fibrillation not on anticoagulation, CHF, coronary artery disease, hypertension, history of breast cancer now in remission, at some point on hospice but taken off of hospice because of improvement after medication adjustment, brought into the emergency room for evaluation of sudden onset of right-sided weakness and inability to talk. tPA was administered.   Left hemispheric embolic infarct on atrial fibrillation and is not on long-term anticoagulation due to history of frequent  falls  CT head no acute abnormality   CTA head & neck: Occlusion left P2/P3 segment likely acute,  Moderate to severe stenosis left M3 segment supplying the left frontal parietal lobe, Advanced atherosclerotic disease aortic arch and proximal left subclavian artery, Mild atherosclerotic disease in the carotid bifurcation bilaterally. Moderate stenosis in the cavernous carotid  bilaterally due to atherosclerotic disease, Moderate to severe stenosis distal left vertebral artery.   MRI acute left MCA infarct involving posterior insula and parietal operculum.  No hemorrhage  2D Echo EF 50%, Left atrial size was severely dilated, Right atrial size was mildly dilated, mild pulm HTN, No shunt, thrombus or wall motion abnormality  LDL 74  HgbA1c 5.3  VTE prophylaxis - SCDs, lovenox    Diet   Diet regular Room service appropriate? Yes; Fluid consistency: Thin  Consider Eliquis start on 5th day post event (5/26) but not started in palliative setting where individualized decisions are being made on each element of treatment plan.  On ASA 81mg  for the present. Per conversation with daughter, Sheryl Suarez, they do not want to anticoagulation and will plan to involve hospice again once she gets settled at [facility].   Therapy recommendations:  PT/OT/ST reconsulted. SNF bed offer at [facility] for Thursday. ALF has declined to for patient to return per Sheryl Suarez, daughter.   Disposition: TBD  Chronic atrial fibrillation  Prior rate controlled on cardizem discontinued on (5/26)  Patient & son agreeable to Eliquis initially but now favoring ASA 81 alone   Eliquis started (5/26) only received 1 dose  Sepsis/E-coli UTI-resolved   SIRS criteria in this patient includes: Leukocytosis, fever, tachycardia  Lactate 2.9->3.1  Procalcitonin 38.2  Sepsis protocol  UA (5/26): LE shows E coli   Blood culture (5/26) negative 5/27.   Switched from IV antibiotic to keflex po (5/27) end date 6/1  Medical team on board   Hypertension  Initially required cleviprex infusion  . Long-term BP goal normotensive  Hyperlipidemia  Lipitor 10mg  discontinue (5/26)   LDL 74, not quite at goal < 70  High intensity statin not warranted as essentially at goal   Continue statin at discharge  Other Stroke Risk Factors  Advanced Age >/= 65    Coronary artery disease  Goals of  Care  Palliative following: continue supportive care with antibiotics and IVF if needed, now DNR in semi-comfort care  Other active problems  Urinary retension: foley placed by urology (5/25). Voiding trial today.   ATTENDING NOTE: I reviewed above note and agree with the assessment and plan. Pt was seen and examined.   No acute event overnight, neuro stable, unchanged, continue on aspirin.  Pending SNF.  For detailed assessment and plan, please refer to above as I have made changes wherever appropriate.   Rosalin Hawking, MD PhD Stroke Neurology 07/22/2020 8:52 PM

## 2020-07-23 DIAGNOSIS — I639 Cerebral infarction, unspecified: Secondary | ICD-10-CM | POA: Diagnosis not present

## 2020-07-23 LAB — SARS CORONAVIRUS 2 (TAT 6-24 HRS): SARS Coronavirus 2: NEGATIVE

## 2020-07-23 MED ORDER — ASPIRIN 81 MG PO TBEC: 81.0000 mg | DELAYED_RELEASE_TABLET | Freq: Every day | ORAL | 11 refills | Status: AC

## 2020-07-23 MED ORDER — SENNOSIDES-DOCUSATE SODIUM 8.6-50 MG PO TABS: 1.0000 | ORAL_TABLET | Freq: Every evening | ORAL | Status: AC | PRN

## 2020-07-23 MED ORDER — ACETAMINOPHEN 325 MG PO TABS: 650.0000 mg | ORAL_TABLET | ORAL | Status: AC | PRN

## 2020-07-23 MED ORDER — POLYVINYL ALCOHOL 1.4 % OP SOLN: 1.0000 [drp] | Freq: Four times a day (QID) | OPHTHALMIC | 0 refills | Status: AC | PRN

## 2020-07-23 MED ORDER — ONDANSETRON 4 MG PO TBDP: 4.0000 mg | ORAL_TABLET | Freq: Four times a day (QID) | ORAL | 0 refills | Status: AC | PRN

## 2020-07-23 MED ORDER — BIOTENE DRY MOUTH MT LIQD: 15.0000 mL | OROMUCOSAL | Status: DC | PRN

## 2020-07-23 NOTE — Discharge Summary (Addendum)
Stroke Discharge Summary  Patient ID: Sheryl Suarez   MRN: [id number]      DOB: June 06, 1923  Date of Admission: 07/11/2020 Date of Discharge: 07/23/2020  Attending Physician:  Dr. Rosalin Hawking Consultant(s):    Palliative care, CCM Patient's PCP:  Leonel Ramsay, MD  DISCHARGE DIAGNOSIS:  1. Left hemispheric embolic infarct on atrial fibrillation and is not on long-term anticoagulation due to history of frequent falls 2. Palliative care/comfort care with exceptions based on each situation  3. Urinary retention with voiding trial underway  Principal Problem:   Acute ischemic stroke [facility]) Active Problems:   HTN (hypertension), benign   Atrial fibrillation, chronic (HCC)   Pressure ulcer   Sepsis due to undetermined organism ([place])   Goals of care, counseling/discussion   DNR (do not resuscitate)  Allergies as of 07/23/2020   No Known Allergies     Medication List    STOP taking these medications   diltiazem 120 MG 24 hr capsule Commonly known as: CARDIZEM CD   feeding supplement (PRO-STAT 64) Liqd   furosemide 20 MG tablet Commonly known as: Lasix     TAKE these medications   acetaminophen 325 MG tablet Commonly known as: TYLENOL Take 2 tablets (650 mg total) by mouth every 4 (four) hours as needed for mild pain (or temp > 37.5 C (99.5 F)). What changed:   when to take this  reasons to take this   antiseptic oral rinse Liqd Apply 15 mLs topically as needed for dry mouth.   aspirin 81 MG EC tablet Take 1 tablet (81 mg total) by mouth daily. Swallow whole. Start taking on: July 24, 2020   cetaphil cream Apply 1 application topically daily. Apply to bilateral legs   lidocaine 5 % Commonly known as: LIDODERM Place 1 patch onto the skin daily. Apply to lower back.   mirtazapine 15 MG tablet Commonly known as: REMERON Take 15 mg by mouth at bedtime.   ondansetron 4 MG disintegrating tablet Commonly known as: ZOFRAN-ODT Take 1 tablet (4 mg total) by mouth  every 6 (six) hours as needed for nausea.   polyvinyl alcohol 1.4 % ophthalmic solution Commonly known as: LIQUIFILM TEARS Place 1 drop into both eyes 4 (four) times daily as needed for dry eyes.   senna-docusate 8.6-50 MG tablet Commonly known as: Senokot-S Take 1 tablet by mouth at bedtime as needed for mild constipation.       LABORATORY STUDIES CBC    Component Value Date/Time   WBC 8.1 07/16/2020 0833   RBC 4.46 07/16/2020 0833   HGB 13.2 07/16/2020 0833   HCT 40.9 07/16/2020 0833   HCT 32.0 (L) 01/18/2019 1222   PLT 167 07/16/2020 0833   MCV 91.7 07/16/2020 0833   MCH 29.6 07/16/2020 0833   MCHC 32.3 07/16/2020 0833   RDW 14.5 07/16/2020 0833   LYMPHSABS 1.6 07/11/2020 1204   MONOABS 0.5 07/11/2020 1204   EOSABS 0.2 07/11/2020 1204   BASOSABS 0.0 07/11/2020 1204   CMP    Component Value Date/Time   NA 137 07/16/2020 0833   K 3.5 07/16/2020 0833   CL 105 07/16/2020 0833   CO2 24 07/16/2020 0833   GLUCOSE 93 07/16/2020 0833   BUN 21 07/16/2020 0833   CREATININE 0.98 07/16/2020 0833   CALCIUM 9.1 07/16/2020 0833   PROT 7.0 07/11/2020 1204   ALBUMIN 3.2 (L) 07/11/2020 1204   AST 15 07/11/2020 1204   ALT 9 07/11/2020 1204  ALKPHOS 106 07/11/2020 1204   BILITOT 0.9 07/11/2020 1204   GFRNONAA 53 (L) 07/16/2020 0833   GFRAA >60 04/25/2019 0541   COAGS Lab Results  Component Value Date   INR 1.3 (H) 07/16/2020   INR 1.1 07/11/2020   INR 1.3 (H) 01/16/2019   Lipid Panel    Component Value Date/Time   CHOL 128 07/12/2020 0255   TRIG 58 07/12/2020 0255   HDL 42 07/12/2020 0255   CHOLHDL 3.0 07/12/2020 0255   VLDL 12 07/12/2020 0255   LDLCALC 74 07/12/2020 0255   HgbA1C  Lab Results  Component Value Date   HGBA1C 5.3 07/12/2020   Urinalysis    Component Value Date/Time   COLORURINE AMBER (A) 07/16/2020 1144   APPEARANCEUR HAZY (A) 07/16/2020 1144   LABSPEC 1.014 07/16/2020 1144   PHURINE 6.0 07/16/2020 1144   GLUCOSEU NEGATIVE 07/16/2020 1144    HGBUR LARGE (A) 07/16/2020 1144   BILIRUBINUR NEGATIVE 07/16/2020 [place] 07/16/2020 1144   PROTEINUR 100 (A) 07/16/2020 1144   NITRITE NEGATIVE 07/16/2020 1144   LEUKOCYTESUR LARGE (A) 07/16/2020 1144   Urine Drug Screen No results found for: LABOPIA, COCAINSCRNUR, LABBENZ, AMPHETMU, THCU, LABBARB  Alcohol Level No results found for: [facility] Covid-19 Nucleic Acid Test Results Lab Results  Component Value Date   SARSCOV2NAA NEGATIVE 07/22/2020   [place] NEGATIVE 07/11/2020   [place] NEGATIVE 04/23/2019   [place] NEGATIVE 01/19/2019   [place] NEGATIVE 01/15/2019   [place] NEGATIVE 04/23/2019    COVID-19 Vaccine Information can be found at: [email or web address] For questions related to vaccine distribution or appointments, please email [email or web address] or call [phone].    SIGNIFICANT DIAGNOSTIC STUDIES MR BRAIN WO CONTRAST  Result Date: 07/12/2020 CLINICAL DATA:  Stroke follow-up. Speech abnormality. Right-sided weakness. EXAM: MRI HEAD WITHOUT CONTRAST TECHNIQUE: Multiplanar, multiecho pulse sequences of the brain and surrounding structures were obtained without intravenous contrast. COMPARISON:  CT angio head and neck 07/11/2020 FINDINGS: Brain: Acute infarct in the left posterior insula and parietal operculum. There is associated T2 and FLAIR edema as well as restricted diffusion. No associated hemorrhage in the infarct. Generalized atrophy. Moderate chronic microvascular ischemic change in the white matter. Mild hyperintensity in the pons. No hemorrhage or mass lesion identified. Vascular: Normal arterial flow voids. Skull and upper cervical spine: C1-2 arthropathy with large amount of pannus posterior to the dens. There is mass-effect on the proximal spinal cord. Moderate spinal stenosis. Sinuses/Orbits: Mild mucosal edema paranasal sinuses. Bilateral cataract extraction Other: None  IMPRESSION: Acute left MCA infarct involving the posterior insula and parietal operculum. No associated hemorrhage Moderate chronic microvascular ischemic changes. C1-2 arthropathy with large amount of pannus causing moderate spinal stenosis and posterior displacement of the proximal spinal cord. This may be due to CPPD or rheumatoid arthritis. Electronically Signed   By: Franchot Gallo M.D.   On: 07/12/2020 13:10   DG Chest Port 1 View  Result Date: 07/16/2020 CLINICAL DATA:  Fever. EXAM: PORTABLE CHEST 1 VIEW COMPARISON:  04/23/2019. FINDINGS: Mediastinum and hilar structures normal. Cardiomegaly. Mild pulmonary venous congestion. Mild bilateral interstitial prominence noted. Tiny left pleural effusion. No pneumothorax. No acute bony abnormality. IMPRESSION: 1.  Cardiomegaly.  Mild pulmonary venous congestion. 2. Mild bilateral interstitial prominence. Mild interstitial edema and/or pneumonitis can not be excluded. Tiny left pleural effusion cannot excluded. Electronically Signed   By: Marcello Moores  Register   On: 07/16/2020 08:46   DG Swallowing Func-Speech Pathology  Result Date: 07/13/2020 Objective Swallowing Evaluation: Type of Study: MBS-Modified Barium Swallow  Study  Patient Details Name: LAKESHIA DOHNER MRN: [id number] Date of Birth: 1923-05-03 Today's Date: 07/13/2020 Time: SLP Start Time (ACUTE ONLY): 1332 -SLP Stop Time (ACUTE ONLY): 1348 SLP Time Calculation (min) (ACUTE ONLY): 16 min Past Medical History: Past Medical History: Diagnosis Date . Arrhythmia   atrial fibrillation . Breast cancer ([place])   remission . CHF (congestive heart failure) ([place])  . Coronary artery disease  . Hypertension  . Stroke [facility])  Past Surgical History: Past Surgical History: Procedure Laterality Date . ABDOMINAL HYSTERECTOMY   . APPENDECTOMY   . BREAST IMPLANT EXCHANGE   . CHOLECYSTECTOMY   . ESOPHAGOGASTRODUODENOSCOPY (EGD) WITH PROPOFOL N/A 08/20/2015  Procedure: ESOPHAGOGASTRODUODENOSCOPY (EGD) WITH PROPOFOL;  Surgeon: Lollie Sails, MD;  Location: [facility] ENDOSCOPY;  Service: Endoscopy;  Laterality: N/A; . MASTECTOMY Bilateral  . ORIF ELBOW FRACTURE Right 01/16/2019  Procedure: OPEN REDUCTION INTERNAL FIXATION (ORIF) ELBOW/OLECRANON FRACTURE;  Surgeon: Earnestine Leys, MD;  Location: [facility] ORS;  Service: Orthopedics;  Laterality: Right; HPI: Patient is a 87 y.o. female with PMH: atrial fibrillation not on anticoagulation, CHF, CAD, HTN, h/o breast cancer now in remission, was on hospice at some point but taken off hospice due to improved from mediation adjustment. She was brought to the ER from SNF for evaluation of new onset right-sided weakness and inability to talk. At baseline, reportedly patient is able to get around using walker and has conversations fairly normally per SNF staff report. CT head was negative for CVA, MRI Acute left MCA infarct involving the posterior insula and parietal  operculum  Subjective: "oh shoot Afnan" Assessment / Plan / Recommendation CHL IP CLINICAL IMPRESSIONS 07/13/2020 Clinical Impression Demonstrated during study was delayed swallow onset, residue and silent aspiraiton. Reduced labial control led to anterior leakage and delayed tranist from oral perspective. Her timing was dysfunctional durning swallow preventing full laryngeal closure leading to pyriform sinus aggregation that was aspirated when swallow initiated. No immediate or delayed reflexive awareness. There was vallecular and pyrifom sinus residue inconsistently. She was able to tuck chin which reduced laryngeal intrusion to minimal and penetration on cords. Cautious recommendation is nectar thick liquids, continue Dys 3, pills whole in puree and intermittent throat clear. Continue ST and pt/family educaiton. SLP Visit Diagnosis Dysphagia, oropharyngeal phase (R13.12) Attention and concentration deficit following -- Frontal lobe and executive function deficit following -- Impact on safety and function Mild aspiration risk;Moderate aspiration  risk   CHL IP TREATMENT RECOMMENDATION 07/13/2020 Treatment Recommendations Therapy as outlined in treatment plan below   Prognosis 07/13/2020 Prognosis for Safe Diet Advancement Good Barriers to Reach Goals -- Barriers/Prognosis Comment -- CHL IP DIET RECOMMENDATION 07/13/2020 SLP Diet Recommendations Dysphagia 3 (Mech soft) solids;Nectar thick liquid Liquid Administration via Cup Medication Administration Whole meds with puree Compensations Slow rate;Small sips/bites Postural Changes Seated upright at 90 degrees   CHL IP OTHER RECOMMENDATIONS 07/13/2020 Recommended Consults -- Oral Care Recommendations Oral care BID Other Recommendations --   CHL IP FOLLOW UP RECOMMENDATIONS 07/13/2020 Follow up Recommendations 24 hour supervision/assistance;Skilled Nursing facility   [facility] IP FREQUENCY AND DURATION 07/13/2020 Speech Therapy Frequency (ACUTE ONLY) min 2x/week Treatment Duration 2 weeks      CHL IP ORAL PHASE 07/13/2020 Oral Phase Impaired Oral - Pudding Teaspoon -- Oral - Pudding Cup -- Oral - Honey Teaspoon -- Oral - Honey Cup -- Oral - Nectar Teaspoon -- Oral - Nectar Cup WFL Oral - Nectar Straw -- Oral - Thin Teaspoon -- Oral - Thin Cup Left anterior bolus loss Oral -  Thin Straw WFL Oral - Puree -- Oral - Mech Soft -- Oral - Regular Delayed oral transit Oral - Multi-Consistency -- Oral - Pill -- Oral Phase - Comment --  CHL IP PHARYNGEAL PHASE 07/13/2020 Pharyngeal Phase Impaired Pharyngeal- Pudding Teaspoon -- Pharyngeal -- Pharyngeal- Pudding Cup -- Pharyngeal -- Pharyngeal- Honey Teaspoon -- Pharyngeal -- Pharyngeal- Honey Cup -- Pharyngeal -- Pharyngeal- Nectar Teaspoon -- Pharyngeal -- Pharyngeal- Nectar Cup Delayed swallow initiation-pyriform sinuses;Pharyngeal residue - valleculae Pharyngeal -- Pharyngeal- Nectar Straw -- Pharyngeal -- Pharyngeal- Thin Teaspoon -- Pharyngeal -- Pharyngeal- Thin Cup Penetration/Aspiration during swallow;Pharyngeal residue - valleculae;Pharyngeal residue - pyriform;Delayed swallow  initiation-pyriform sinuses Pharyngeal Material enters airway, passes BELOW cords without attempt by patient to eject out (silent aspiration);Material enters airway, CONTACTS cords and not ejected out Pharyngeal- Thin Straw Penetration/Aspiration during swallow Pharyngeal Material enters airway, passes BELOW cords without attempt by patient to eject out (silent aspiration) Pharyngeal- Puree -- Pharyngeal -- Pharyngeal- Mechanical Soft -- Pharyngeal -- Pharyngeal- Regular WFL Pharyngeal -- Pharyngeal- Multi-consistency -- Pharyngeal -- Pharyngeal- Pill -- Pharyngeal -- Pharyngeal Comment --  CHL IP CERVICAL ESOPHAGEAL PHASE 07/13/2020 Cervical Esophageal Phase WFL Pudding Teaspoon -- Pudding Cup -- Honey Teaspoon -- Honey Cup -- Nectar Teaspoon -- Nectar Cup -- Nectar Straw -- Thin Teaspoon -- Thin Cup -- Thin Straw -- Puree -- Mechanical Soft -- Regular -- Multi-consistency -- Pill -- Cervical Esophageal Comment -- [place] Siren 07/13/2020, 2:22 PM Orbie Pyo Colvin Caroli.Ed Actor Pager [phone] Office [phone]              ECHOCARDIOGRAM COMPLETE  Result Date: 07/12/2020    ECHOCARDIOGRAM REPORT   Patient Name:   KARIN PINEDO Date of Exam: 07/12/2020 Medical Rec #:  [id number]     Height:       62.0 in Accession #:    [id number]    Weight:       120.8 lb Date of Birth:  08-13-1923     BSA:          1.543 m Patient Age:    87 years      BP:           163/76 mmHg Patient Gender: F             HR:           88 bpm. Exam Location:  Inpatient Procedure: 2D Echo, Color Doppler and Cardiac Doppler Indications:    Stroke  History:        Patient has prior history of Echocardiogram examinations, most                 recent 04/24/2019. CHF, CAD, Stroke; Risk Factors:Hypertension.  Sonographer:    Bernadene Person RDCS Referring Phys: [id number] ASHISH ARORA IMPRESSIONS  1. Left ventricular ejection fraction, by estimation, is 50%. The left ventricle has low normal function. The left ventricle has  no regional wall motion abnormalities. Left ventricular diastolic parameters are indeterminate.  2. Right ventricular systolic function is low normal. The right ventricular size is mildly enlarged. There is mildly elevated pulmonary artery systolic pressure.  3. Left atrial size was severely dilated.  4. Right atrial size was mildly dilated.  5. The mitral valve is abnormal. Moderate mitral valve regurgitation. No evidence of mitral stenosis.  6. Tricuspid valve regurgitation is moderate.  7. The aortic valve is tricuspid. There is mild calcification of the aortic valve. There is mild thickening of the aortic valve. Aortic valve regurgitation is mild.  8. The inferior vena cava is normal in size with greater than 50% respiratory variability, suggesting right atrial pressure of 3 mmHg. FINDINGS  Left Ventricle: Left ventricular ejection fraction, by estimation, is 50%. The left ventricle has low normal function. The left ventricle has no regional wall motion abnormalities. The left ventricular internal cavity size was normal in size. There is no left ventricular hypertrophy. Left ventricular diastolic parameters are indeterminate. Right Ventricle: The right ventricular size is mildly enlarged. No increase in right ventricular wall thickness. Right ventricular systolic function is low normal. There is mildly elevated pulmonary artery systolic pressure. The tricuspid regurgitant velocity is 2.95 m/s, and with an assumed right atrial pressure of 3 mmHg, the estimated right ventricular systolic pressure is 93.2 mmHg. Left Atrium: Left atrial size was severely dilated. Right Atrium: Right atrial size was mildly dilated. Pericardium: There is no evidence of pericardial effusion. Mitral Valve: The mitral valve is abnormal. There is mild thickening of the mitral valve leaflet(s). There is mild calcification of the mitral valve leaflet(s). Mild mitral annular calcification. Moderate mitral valve regurgitation. No evidence of  mitral  valve stenosis. Tricuspid Valve: The tricuspid valve is not well visualized. Tricuspid valve regurgitation is moderate . No evidence of tricuspid stenosis. Aortic Valve: The aortic valve is tricuspid. There is mild calcification of the aortic valve. There is mild thickening of the aortic valve. There is mild aortic valve annular calcification. Aortic valve regurgitation is mild. Pulmonic Valve: The pulmonic valve was not well visualized. Pulmonic valve regurgitation is not visualized. No evidence of pulmonic stenosis. Aorta: The aortic root is normal in size and structure. Pulmonary Artery: Mild pulmonary HTN, PASP is 38 mmHg. Venous: The inferior vena cava is normal in size with greater than 50% respiratory variability, suggesting right atrial pressure of 3 mmHg. IAS/Shunts: No atrial level shunt detected by color flow Doppler.  LEFT VENTRICLE PLAX 2D LVIDd:         4.00 cm LVIDs:         3.10 cm LV PW:         0.90 cm LV IVS:        1.00 cm LVOT diam:     1.80 cm LV SV:         32 LV SV Index:   20 LVOT Area:     2.54 cm  LV Volumes (MOD) LV vol d, MOD A2C: 65.3 ml LV vol d, MOD A4C: 53.6 ml LV vol s, MOD A2C: 41.6 ml LV vol s, MOD A4C: 34.4 ml LV SV MOD A2C:     23.7 ml LV SV MOD A4C:     53.6 ml LV SV MOD BP:      24.0 ml RIGHT VENTRICLE TAPSE (M-mode): 1.1 cm LEFT ATRIUM             Index       RIGHT ATRIUM           Index LA diam:        3.90 cm 2.53 cm/m  RA Area:     18.30 cm LA Vol (A2C):   89.3 ml 57.87 ml/m RA Volume:   49.30 ml  31.95 ml/m LA Vol (A4C):   90.0 ml 58.33 ml/m LA Biplane Vol: 97.9 ml 63.45 ml/m  AORTIC VALVE LVOT Vmax:   58.10 cm/s LVOT Vmean:  42.667 cm/s LVOT VTI:    0.124 m  AORTA Ao Root diam: 3.00 cm Ao Asc diam:  3.10 cm MR Peak grad:  135.0 mmHg  TRICUSPID VALVE MR Mean grad:    85.0 mmHg   TR Peak grad:   34.8 mmHg MR Vmax:         581.00 cm/s TR Vmax:        295.00 cm/s MR Vmean:        433.0 cm/s MR PISA:         1.01 cm    SHUNTS MR PISA Eff ROA: 7 mm        Systemic VTI:  0.12 m MR PISA Radius:  0.40 cm     Systemic Diam: 1.80 cm Carlyle Dolly MD Electronically signed by Carlyle Dolly MD Signature Date/Time: 07/12/2020/12:37:29 PM    Final    CT HEAD CODE STROKE WO CONTRAST  Result Date: 07/11/2020 CLINICAL DATA:  Code stroke. Acute neuro deficit. Right facial droop and weakness. EXAM: CT HEAD WITHOUT CONTRAST TECHNIQUE: Contiguous axial images were obtained from the base of the skull through the vertex without intravenous contrast. COMPARISON:  CT head 04/23/2019 FINDINGS: Brain: Generalized atrophy. Patchy white matter hypodensity bilaterally unchanged from the prior study. Negative for acute infarct, acute hemorrhage, or mass lesion. No midline shift. Vascular: Negative for hyperdense vessel Skull: Negative Sinuses/Orbits: Paranasal sinuses clear. Bilateral cataract extraction Other: None ASPECTS ([facility] Stroke Program Early CT Score) - Ganglionic level infarction (caudate, lentiform nuclei, internal capsule, insula, M1-M3 cortex): 7 - Supraganglionic infarction (M4-M6 cortex): 3 Total score (0-10 with 10 being normal): 10 IMPRESSION: 1. No acute abnormality. 2. ASPECTS is 10 3. Atrophy and chronic microvascular ischemic changes are stable from the prior MRI 4. Code stroke imaging results were communicated on 07/11/2020 at 12:25 pm to provider Rory Percy via text page Electronically Signed   By: Franchot Gallo M.D.   On: 07/11/2020 12:25   CT ANGIO HEAD NECK W WO CM W PERF  Result Date: 07/11/2020 CLINICAL DATA:  Acute neuro deficit. Right facial droop and weakness. EXAM: CT ANGIOGRAPHY HEAD AND NECK TECHNIQUE: Multidetector CT imaging of the head and neck was performed using the standard protocol during bolus administration of intravenous contrast. Multiplanar CT image reconstructions and MIPs were obtained to evaluate the vascular anatomy. Carotid stenosis measurements (when applicable) are obtained utilizing NASCET criteria, using the distal internal  carotid diameter as the denominator. CONTRAST:  102mL OMNIPAQUE IOHEXOL 350 MG/ML SOLN COMPARISON:  CT head 07/11/2020 FINDINGS: CTA NECK FINDINGS Aortic arch: Extensive atherosclerotic calcification in the aortic arch and proximal left subclavian artery with mild stenosis. Bovine branching arch. Right carotid system: Right carotid system widely patent. Mild atherosclerotic calcification right carotid bifurcation Left carotid system: Left carotid widely patent. Mild atherosclerotic calcification left carotid bifurcation. Vertebral arteries: Stenosis at the origin of the vertebral artery bilaterally due to atherosclerotic disease. Right vertebral artery patent to the basilar without additional stenosis. Irregular stenosis distal left vertebral artery at the foramen magnum and V4 segment. Skeleton: Cervicothoracic scoliosis.  Advanced cervical spondylosis. Other neck: Negative for mass or edema in the neck. Upper chest: Lung apices clear bilaterally without acute abnormality. Review of the MIP images confirms the above findings CTA HEAD FINDINGS Anterior circulation: Atherosclerotic calcification throughout the cavernous carotid with moderate stenosis bilaterally. Anterior and middle cerebral arteries patent bilaterally. There is a moderate stenosis of the left M3 segment supplying the frontal parietal lobe. M1 segments widely patent bilaterally. No right MCA stenosis. Both anterior cerebral arteries are patent without stenosis. Posterior circulation: Moderate to severe stenosis distal left vertebral artery at the skull base and V4 segment. Right  vertebral artery widely patent. PICA patent bilaterally. Basilar widely patent. Right AICA patent. Superior cerebellar artery patent bilaterally. Right posterior cerebral artery widely patent Occlusion left P2/P3 segment Venous sinuses: Normal venous enhancement Anatomic variants: None Review of the MIP images confirms the above findings IMPRESSION: 1. Occlusion left P2/P3  segment likely acute 2. Moderate to severe stenosis left M3 segment supplying the left frontal parietal lobe. 3. Advanced atherosclerotic disease aortic arch and proximal left subclavian artery 4. Mild atherosclerotic disease in the carotid bifurcation bilaterally. Moderate stenosis in the cavernous carotid bilaterally due to atherosclerotic disease. 5. Moderate to severe stenosis distal left vertebral artery. 6. These results were called by telephone at the time of interpretation on 07/11/2020 at 12:49 pm to provider [facility] , who verbally acknowledged these results. Electronically Signed   By: Franchot Gallo M.D.   On: 07/11/2020 12:50     HISTORY OF PRESENT ILLNESS BIRDELLA SIPPEL a 87 y.o.femalepast medical of atrial fibrillation not on anticoagulation, CHF, coronary artery disease, hypertension, history of breast cancer now in remission, at some point on hospice but taken off of hospice because of improvement after medication adjustment, brought into the emergency room for evaluation of sudden onset of right-sided weakness and inability to talk. tPA was administered.   HOSPITAL COURSE Mrs. Tanton was admitted to the neurologic ICU for post thrombolytic monitoring and care. She remained hemodynamically stable and improved neurologically. Her aphasia was much improved after a few days and right sided weakness also much improved. She was transferred to the floor where she progressed well until developing a UTI with urinary rentention as below. Family has been making individual decisions for specific scenarios with an overall comfort/palliative care approach. Her hospital problem list is presented below.  Left hemispheric embolic infarct on atrial fibrillation and is not on long-term anticoagulation due to history of frequent falls  CT head no acute abnormality   CTA head & neck: Occlusion left P2/P3 segment likely acute,  Moderate to severe stenosis left M3 segment supplying the left frontal  parietal lobe, Advanced atherosclerotic disease aortic arch and proximal left subclavian artery, Mild atherosclerotic disease in the carotid bifurcation bilaterally. Moderate stenosis in the cavernous carotid bilaterally due to atherosclerotic disease, Moderate to severe stenosis distal left vertebral artery.   MRI acute left MCA infarct involving posterior insula and parietal operculum.  No hemorrhage  2D Echo EF 50%, Left atrial size was severely dilated, Right atrial size was mildly dilated, mild pulm HTN, No shunt, thrombus or wall motion abnormality  LDL 74  HgbA1c 5.3  VTE prophylaxis - SCDs, lovenox         Diet     Diet regular Room service appropriate? Yes; Fluid consistency: Thin       Recommended starting anticoagulation with Eliquis on 5th day post event (5/26) but not started in palliative setting per family decisio. Individualized decisions are being made on each element of treatment plan.   On ASA 81mg  for the present with family approval. Per conversation with daughter, [name], they do not want anticoagulation and will plan to involve hospice again once she gets settled at [facility].   Therapy recommendations:  PT/OT/ST reconsulted. SNF bed offer at [facility] accepted.  ALF has declined to for patient to return per [name], daughter.   Disposition: [facility]  Chronic atrial fibrillation  Prior rate controlled on cardizem discontinued on (5/26)  Patient & son agreeable to Eliquis initially but now favoring ASA 81 alone   Eliquis  started (5/26) only received 1 dose  E-coli UTI-resolved   SIRS criteria in this patient includes: Leukocytosis, fever, tachycardia  Lactate 2.9->3.1  Procalcitonin 38.2  UA (5/26): LE shows E coli   Blood culture (5/26) negative 5/27.   Switched from IV antibiotic to keflex po (5/27) end date 6/1  Medical team on board   Hypertension  Initially required cleviprex infusion   Long-term BP goal  normotensive  Hyperlipidemia  Lipitor 10mg  discontinue (5/26)   LDL 74, not quite at goal < 70  High intensity statin not warranted as essentially at goal   Continue statin at discharge  Other Stroke Risk Factors  Advanced Age >/= 95    Coronary artery disease  Goals of Care  Palliative following: continue supportive care with antibiotics and IVF if needed, ASA 81mg  daily for stroke prevention and now DNR in semi-comfort care. She has become ambulatory and functioning overall well at this point.   Has not required analgesics or agitation medications.   Other active problems  Urinary retension: foley placed by urology (5/25). Voiding trial failed 6/1-6/2 underway. Able to urinate adequately this morning. Please monitor with bladder scans for the need to possibly replace foley cath.   Oral medication not added but could consider flomax if family's focus of care changes.   RN Pressure Injury Documentation: Pressure Injury 07/11/20 Sacrum Bilateral;Lower Stage 2 -  Partial thickness loss of dermis presenting as a shallow open injury with a red, pink wound bed without slough. Stage 2 wound extending to both sides of buttocks, open tissue with erythema (Active)  07/11/20 1800  Location: Sacrum  Location Orientation: Bilateral;Lower  Staging: Stage 2 -  Partial thickness loss of dermis presenting as a shallow open injury with a red, pink wound bed without slough.  Wound Description (Comments): Stage 2 wound extending to both sides of buttocks, open tissue with erythema  Present on Admission: Yes   DISCHARGE EXAM Blood pressure (!) 155/80, pulse 80, temperature 98.1 F (36.7 C), temperature source Oral, resp. rate 16, height 5\' 2"  (1.575 m), weight 54.8 kg, SpO2 95 %.  General: Elderly female lying in bed in NAD HEENT: Normocephalic/atraumatic Resp: No extra work of breathing  Cardiovascular: Irregularly irregular Extremities warm well perfused Neurological exam Awake,  fully alert and oriented. Conversant with some speech errors.  She can follow simple one to two-step commands. She is able to name and repeat. Cranial nerves: Pupils equal round reactive light, left gaze preference, is able to come to midline but not look to the right consistently, does not blink to threat from the right, mild right lower facial asymmetry. Motor exam: Right upper extremity weakness but able to lift off the bed and perform arm roll and RAMs with impaired speed and accuracy. 3/5 right lower extremity. Left upper 4/5. Left lower 4/5. Sensory exam: appears intact  Discharge Diet       Diet   Diet regular Room service appropriate? Yes; Fluid consistency: Thin   liquids  DISCHARGE PLAN  Disposition:  SNF  ASA 81mg  daily has been approved by family for stroke prevention pending further goals of care discussion.   Ongoing stroke risk factor control by Primary Care Physician at time of discharge  Follow-up PCP Leonel Ramsay, MD in 2 weeks.  Follow-up in [facility] in 4 weeks, office to schedule an appointment.   35 minutes were spent preparing discharge.  Delila Glenna Fellows, NP-C  ATTENDING NOTE: I reviewed above note and agree  with the assessment and plan. Pt was seen and examined.   Neuro stable, no acute event overnight. Pt ready for SNF discharge. For detailed assessment and plan, please refer to above as I have made changes wherever appropriate.   Rosalin Hawking, MD PhD Stroke Neurology 07/23/2020 1:23 PM

## 2020-07-23 NOTE — Progress Notes (Deleted)
Discharge instructions given to patient. Report will be called to facility. Discharge paperwork given to [facility]

## 2020-07-23 NOTE — Progress Notes (Signed)
Report called to [facility] and given to [employer]

## 2020-07-23 NOTE — TOC Transition Note (Signed)
Transition of Care [facility]) - CM/SW Discharge Note   Patient Details  Name: Sheryl Suarez MRN: [id number] Date of Birth: 1923/08/29  [facility]) CM/SW Contact:  Emeterio Reeve, [place] Phone Number: 07/23/2020, 11:26 AM   Clinical Narrative:     Patient will DC to: [facility] Anticipated DC date: 07/23/20 Family notified: Daughter Information systems manager by: Corey Harold     Per MD patient ready for DC to [facility] . RN, patient, patient's family, and facility notified of DC. Discharge Summary and FL2 sent to facility. DC packet on chart. Pt is covid negative. Ambulance transport requested for patient.    RN to call report to [phone].   Pts daughter [name] is requesting to be notified when PTAR arrives.   CSW will sign off for now as social work intervention is no longer needed. Please consult [place] again if new needs arise.   Final next level of care: Skilled Nursing Facility Barriers to Discharge: Barriers Resolved   Patient Goals and CMS Choice Patient states their goals for this hospitalization and ongoing recovery are:: patient unable to participate in goal setting CMS Medicare.gov Compare Post Acute Care list provided to:: Patient Represenative (must comment) Choice offered to / list presented to : Adult Children  Discharge Placement              Patient chooses bed at: [facility] Patient to be transferred to facility by: [facility] Name of family member notified: Daughter [name] Patient and family notified of of transfer: 07/23/20  Discharge Plan and Services     Post Acute Care Choice: Home Health                               Social Determinants of Health (SDOH) Interventions     Readmission Risk Interventions No flowsheet data found.  Emeterio Reeve, Latanya Presser, [place] Social Worker [phone]

## 2020-09-07 ENCOUNTER — Emergency Department: Payer: Medicare Other

## 2020-09-07 ENCOUNTER — Observation Stay
Admission: EM | Admit: 2020-09-07 | Discharge: 2020-09-08 | Disposition: A | Discharge status: Home / Self Care | Payer: Medicare Other | Attending: Internal Medicine | Admitting: Internal Medicine

## 2020-09-07 ENCOUNTER — Encounter: Payer: Self-pay | Admitting: Radiology

## 2020-09-07 DIAGNOSIS — Z7982 Long term (current) use of aspirin: Secondary | ICD-10-CM | POA: Diagnosis not present

## 2020-09-07 DIAGNOSIS — R7989 Other specified abnormal findings of blood chemistry: Secondary | ICD-10-CM | POA: Diagnosis not present

## 2020-09-07 DIAGNOSIS — Z20822 Contact with and (suspected) exposure to covid-19: Secondary | ICD-10-CM | POA: Insufficient documentation

## 2020-09-07 DIAGNOSIS — S0083XA Contusion of other part of head, initial encounter: Secondary | ICD-10-CM | POA: Diagnosis not present

## 2020-09-07 DIAGNOSIS — I110 Hypertensive heart disease with heart failure: Secondary | ICD-10-CM | POA: Insufficient documentation

## 2020-09-07 DIAGNOSIS — E785 Hyperlipidemia, unspecified: Secondary | ICD-10-CM | POA: Diagnosis present

## 2020-09-07 DIAGNOSIS — W19XXXA Unspecified fall, initial encounter: Secondary | ICD-10-CM | POA: Diagnosis not present

## 2020-09-07 DIAGNOSIS — N179 Acute kidney failure, unspecified: Secondary | ICD-10-CM | POA: Insufficient documentation

## 2020-09-07 DIAGNOSIS — I5032 Chronic diastolic (congestive) heart failure: Secondary | ICD-10-CM | POA: Diagnosis not present

## 2020-09-07 DIAGNOSIS — Z8673 Personal history of transient ischemic attack (TIA), and cerebral infarction without residual deficits: Secondary | ICD-10-CM

## 2020-09-07 DIAGNOSIS — I10 Essential (primary) hypertension: Secondary | ICD-10-CM | POA: Diagnosis present

## 2020-09-07 DIAGNOSIS — I2510 Atherosclerotic heart disease of native coronary artery without angina pectoris: Secondary | ICD-10-CM | POA: Diagnosis not present

## 2020-09-07 DIAGNOSIS — Z79899 Other long term (current) drug therapy: Secondary | ICD-10-CM | POA: Insufficient documentation

## 2020-09-07 DIAGNOSIS — S0990XA Unspecified injury of head, initial encounter: Secondary | ICD-10-CM | POA: Diagnosis present

## 2020-09-07 DIAGNOSIS — Y92129 Unspecified place in nursing home as the place of occurrence of the external cause: Secondary | ICD-10-CM | POA: Insufficient documentation

## 2020-09-07 DIAGNOSIS — Z853 Personal history of malignant neoplasm of breast: Secondary | ICD-10-CM | POA: Insufficient documentation

## 2020-09-07 DIAGNOSIS — R296 Repeated falls: Secondary | ICD-10-CM | POA: Diagnosis present

## 2020-09-07 DIAGNOSIS — I482 Chronic atrial fibrillation, unspecified: Secondary | ICD-10-CM | POA: Diagnosis present

## 2020-09-07 HISTORY — DX: Acute respiratory failure with hypoxia: J96.01

## 2020-09-07 LAB — CBC WITH DIFFERENTIAL/PLATELET
Abs Immature Granulocytes: 0.04 10*3/uL (ref 0.00–0.07)
Basophils Absolute: 0 10*3/uL (ref 0.0–0.1)
Basophils Relative: 0 %
Eosinophils Absolute: 0.1 10*3/uL (ref 0.0–0.5)
Eosinophils Relative: 1 %
HCT: 40 % (ref 36.0–46.0)
Hemoglobin: 13 g/dL (ref 12.0–15.0)
Immature Granulocytes: 0 %
Lymphocytes Relative: 11 %
Lymphs Abs: 1.2 10*3/uL (ref 0.7–4.0)
MCH: 29.3 pg (ref 26.0–34.0)
MCHC: 32.5 g/dL (ref 30.0–36.0)
MCV: 90.3 fL (ref 80.0–100.0)
Monocytes Absolute: 0.8 10*3/uL (ref 0.1–1.0)
Monocytes Relative: 8 %
Neutro Abs: 8.8 10*3/uL — ABNORMAL HIGH (ref 1.7–7.7)
Neutrophils Relative %: 80 %
Platelets: 308 10*3/uL (ref 150–400)
RBC: 4.43 MIL/uL (ref 3.87–5.11)
RDW: 14.9 % (ref 11.5–15.5)
WBC: 10.9 10*3/uL — ABNORMAL HIGH (ref 4.0–10.5)
nRBC: 0 % (ref 0.0–0.2)

## 2020-09-07 LAB — COMPREHENSIVE METABOLIC PANEL
ALT: 17 U/L (ref 0–44)
AST: 40 U/L (ref 15–41)
Albumin: 2.9 g/dL — ABNORMAL LOW (ref 3.5–5.0)
Alkaline Phosphatase: 83 U/L (ref 38–126)
Anion gap: 9 (ref 5–15)
BUN: 25 mg/dL — ABNORMAL HIGH (ref 8–23)
CO2: 28 mmol/L (ref 22–32)
Calcium: 8.8 mg/dL — ABNORMAL LOW (ref 8.9–10.3)
Chloride: 98 mmol/L (ref 98–111)
Creatinine, Ser: 1.62 mg/dL — ABNORMAL HIGH (ref 0.44–1.00)
GFR, Estimated: 29 mL/min — ABNORMAL LOW (ref >60)
Glucose, Bld: 144 mg/dL — ABNORMAL HIGH (ref 70–99)
Potassium: 3.7 mmol/L (ref 3.5–5.1)
Sodium: 135 mmol/L (ref 135–145)
Total Bilirubin: 0.8 mg/dL (ref 0.3–1.2)
Total Protein: 6.6 g/dL (ref 6.5–8.1)

## 2020-09-07 LAB — TROPONIN I (HIGH SENSITIVITY): Troponin I (High Sensitivity): 38 ng/L — ABNORMAL HIGH (ref <18)

## 2020-09-07 LAB — BRAIN NATRIURETIC PEPTIDE: B Natriuretic Peptide: 703.6 pg/mL — ABNORMAL HIGH (ref 0.0–100.0)

## 2020-09-07 IMAGING — CT CT HEAD W/O CM
3 series · 16 of 47 positions shown, 19 images · non-contrast
Comparison: [DATE], [DATE]

CLINICAL DATA: Found down, scalp hematoma

EXAM:
CT HEAD WITHOUT CONTRAST
TECHNIQUE: Contiguous axial images were obtained from the base of the skull
through the vertex without intravenous contrast.

[Series 2: head wo · axial · 0.45mm/px · z∈[+133,+263]mm · 10 of 32 slices shown, 13 images]
[im 3/32  brain]
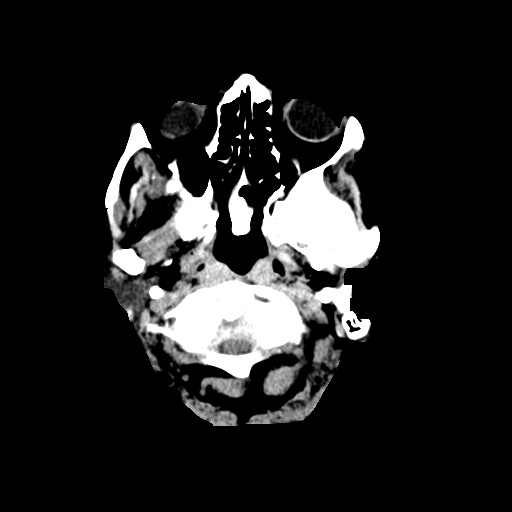
[im 3/32  bone]
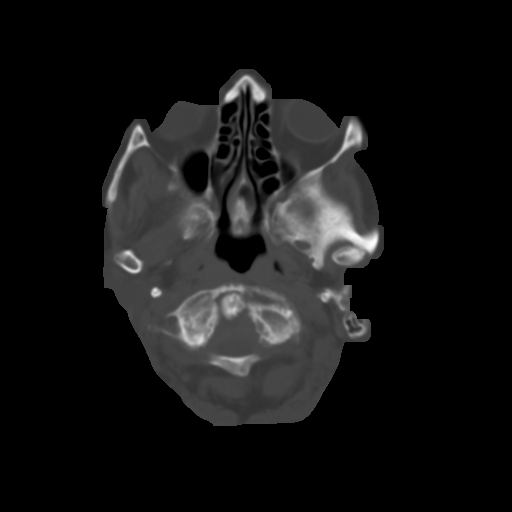
[im 6/32  brain]
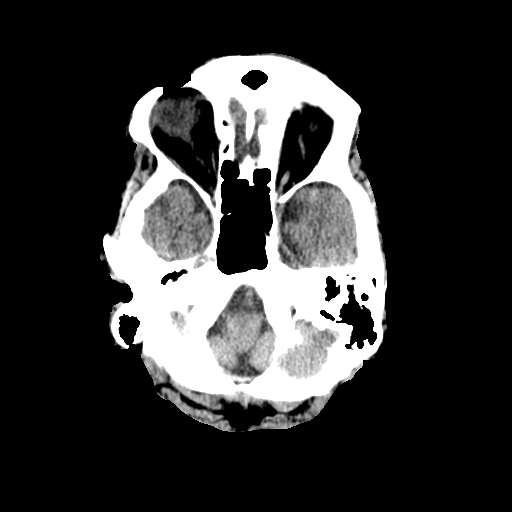
[im 9/32  brain]
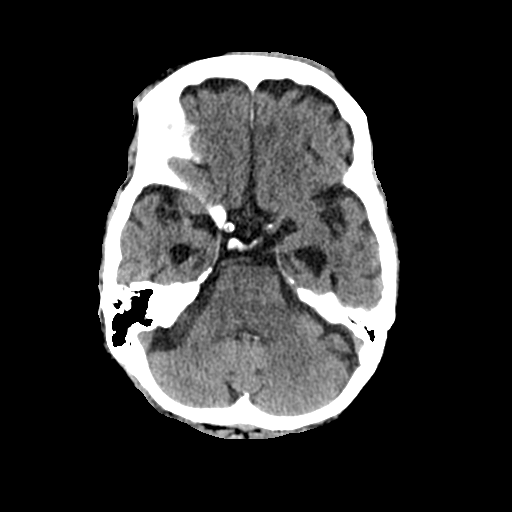
[im 11/32  brain]
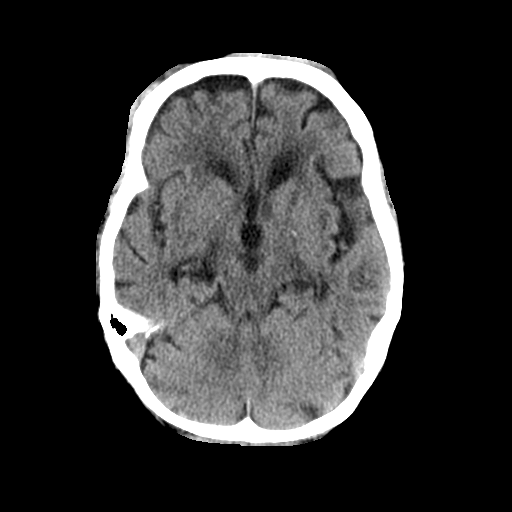
[im 14/32  brain]
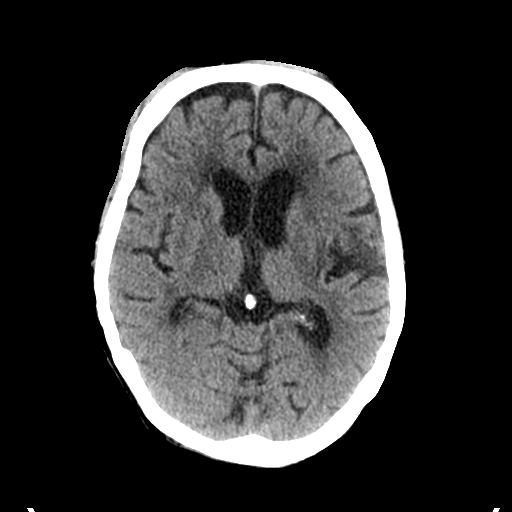
[im 14/32  bone]
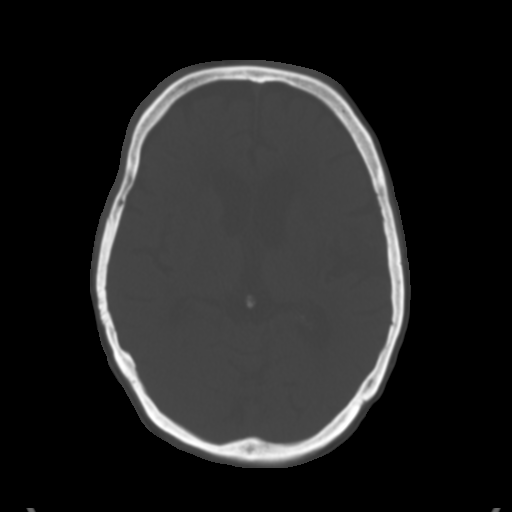
[im 18/32  brain]
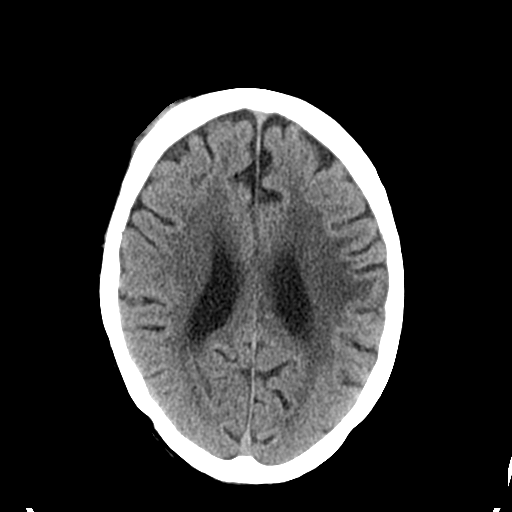
[im 21/32  brain]
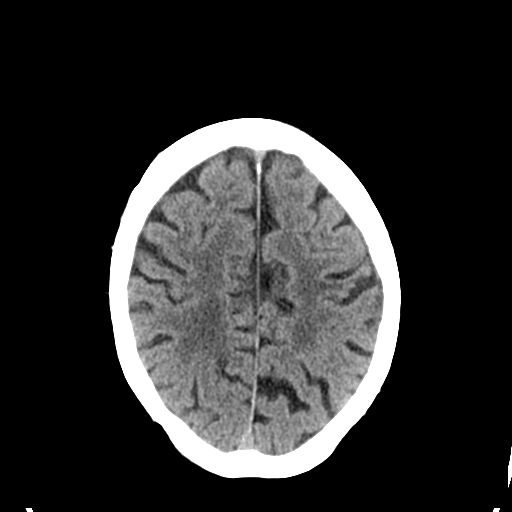
[im 24/32  brain]
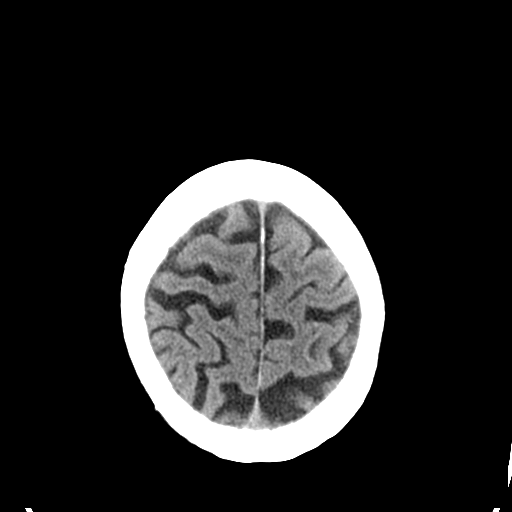
[im 26/32  brain]
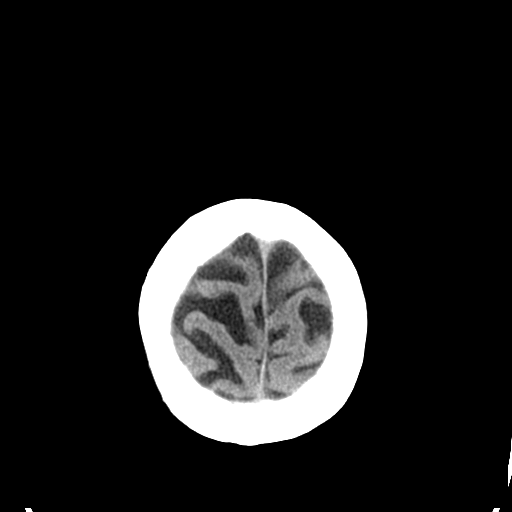
[im 26/32  bone]
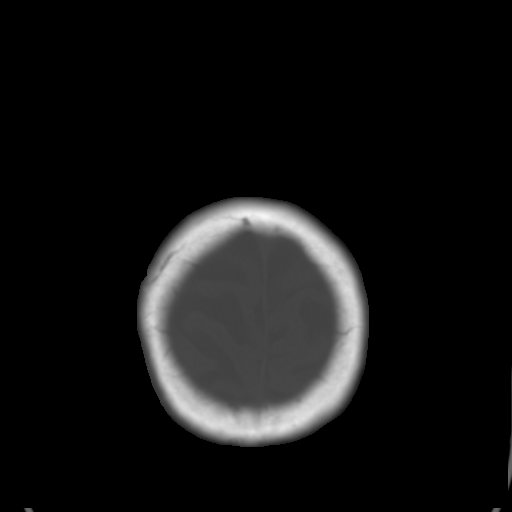
[im 29/32  brain]
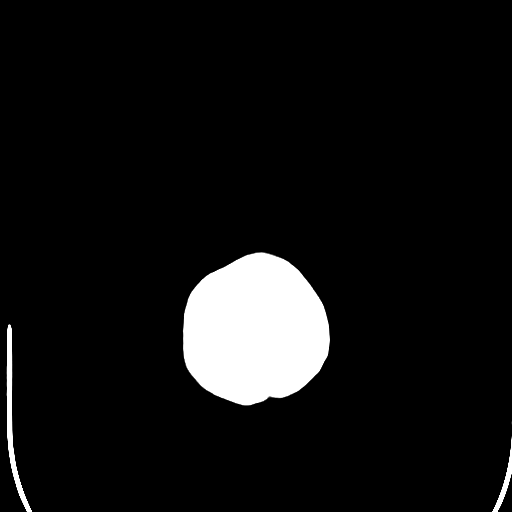

[Series 4: coronal soft tissue · coronal · 0.32mm/px · 3 of 73 slices shown]
[im 25/73  brain]
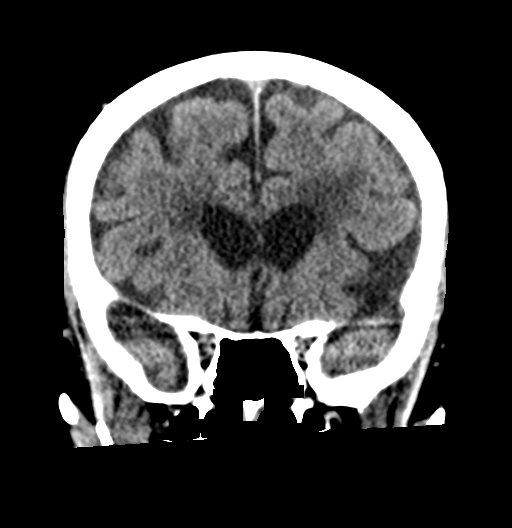
[im 33/73  brain]
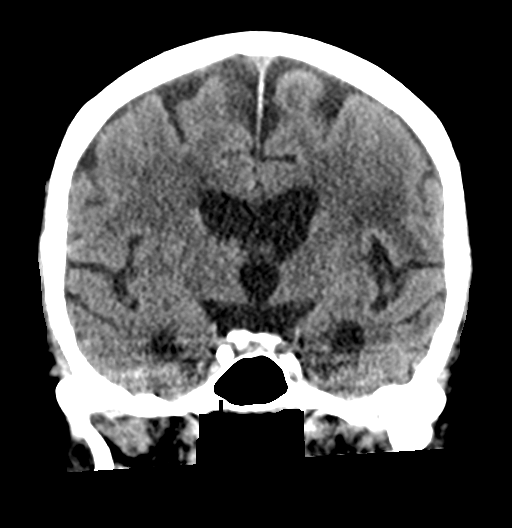
[im 41/73  brain]
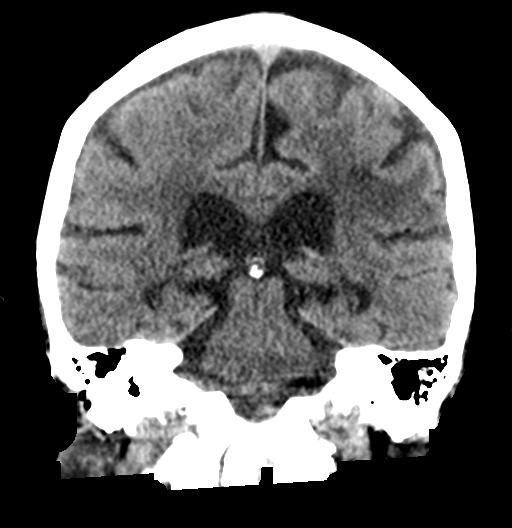

[Series 5: sagittal soft tissue · sagittal · 0.33mm/px · 3 of 51 slices shown]
[im 17/51  brain]
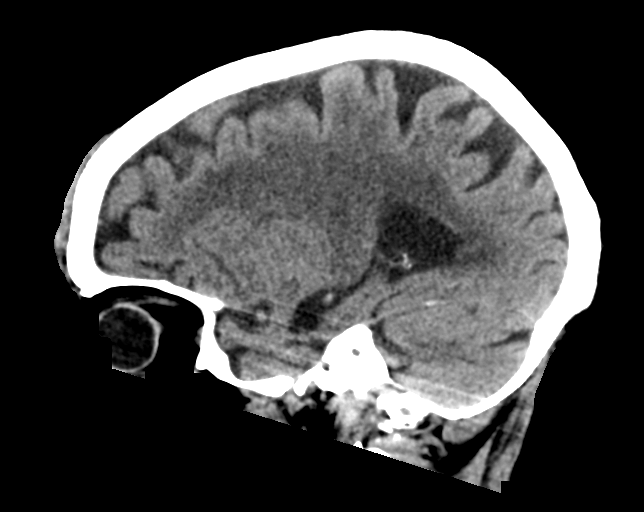
[im 26/51  brain]
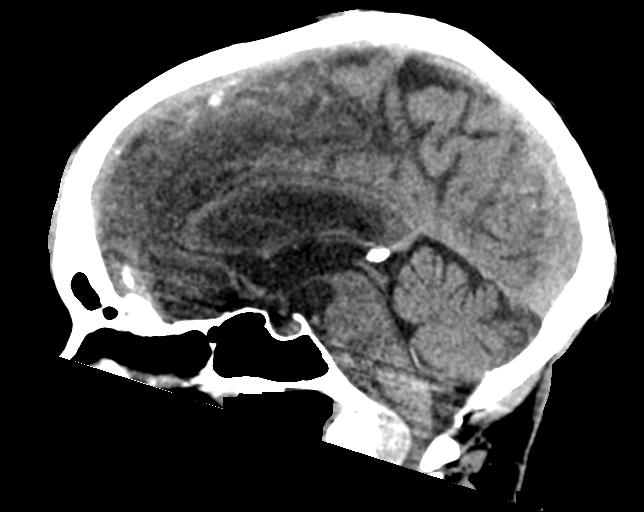
[im 34/51  brain]
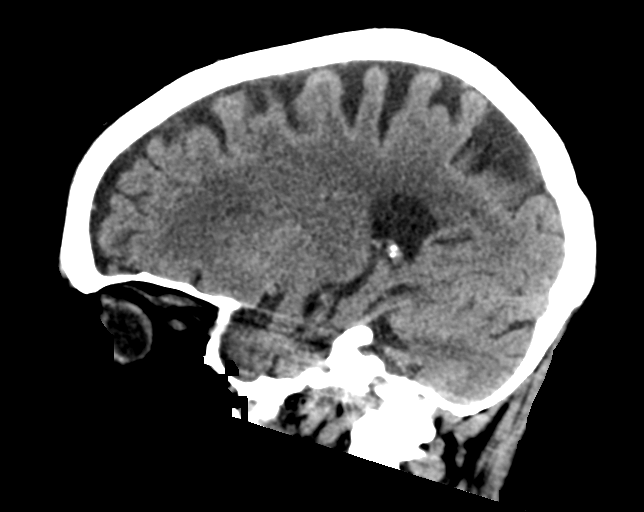

[16 of 47 positions shown; findings below may reference images not displayed]

FINDINGS: Brain: There has been evolution of the left MCA infarct seen on
previous exams. Chronic small vessel ischemic changes are again
noted throughout the white matter.

No acute infarct or hemorrhage. Lateral ventricles and midline
structures are unremarkable. No acute extra-axial fluid collections.
No mass effect.

Vascular: No hyperdense vessel or unexpected calcification.

Skull: There are bilateral frontal scalp hematomas. No underlying
fracture. Remainder of the calvarium is unremarkable.

Sinuses/Orbits: No acute finding.

Other: None.
IMPRESSION: 1. Bilateral frontal scalp hematomas. Otherwise no acute
intracranial process.
2. Evolution of the left MCA territory infarct seen on previous
exam. Stable chronic small-vessel ischemic changes elsewhere within
the white matter.

## 2020-09-07 IMAGING — DX DG CHEST 1V PORT
1 series · 1 of 1 positions shown · non-contrast
Comparison: [DATE]

CLINICAL DATA: Pulmonary crackles on examination

EXAM:
PORTABLE CHEST 1 VIEW

[chest ap]
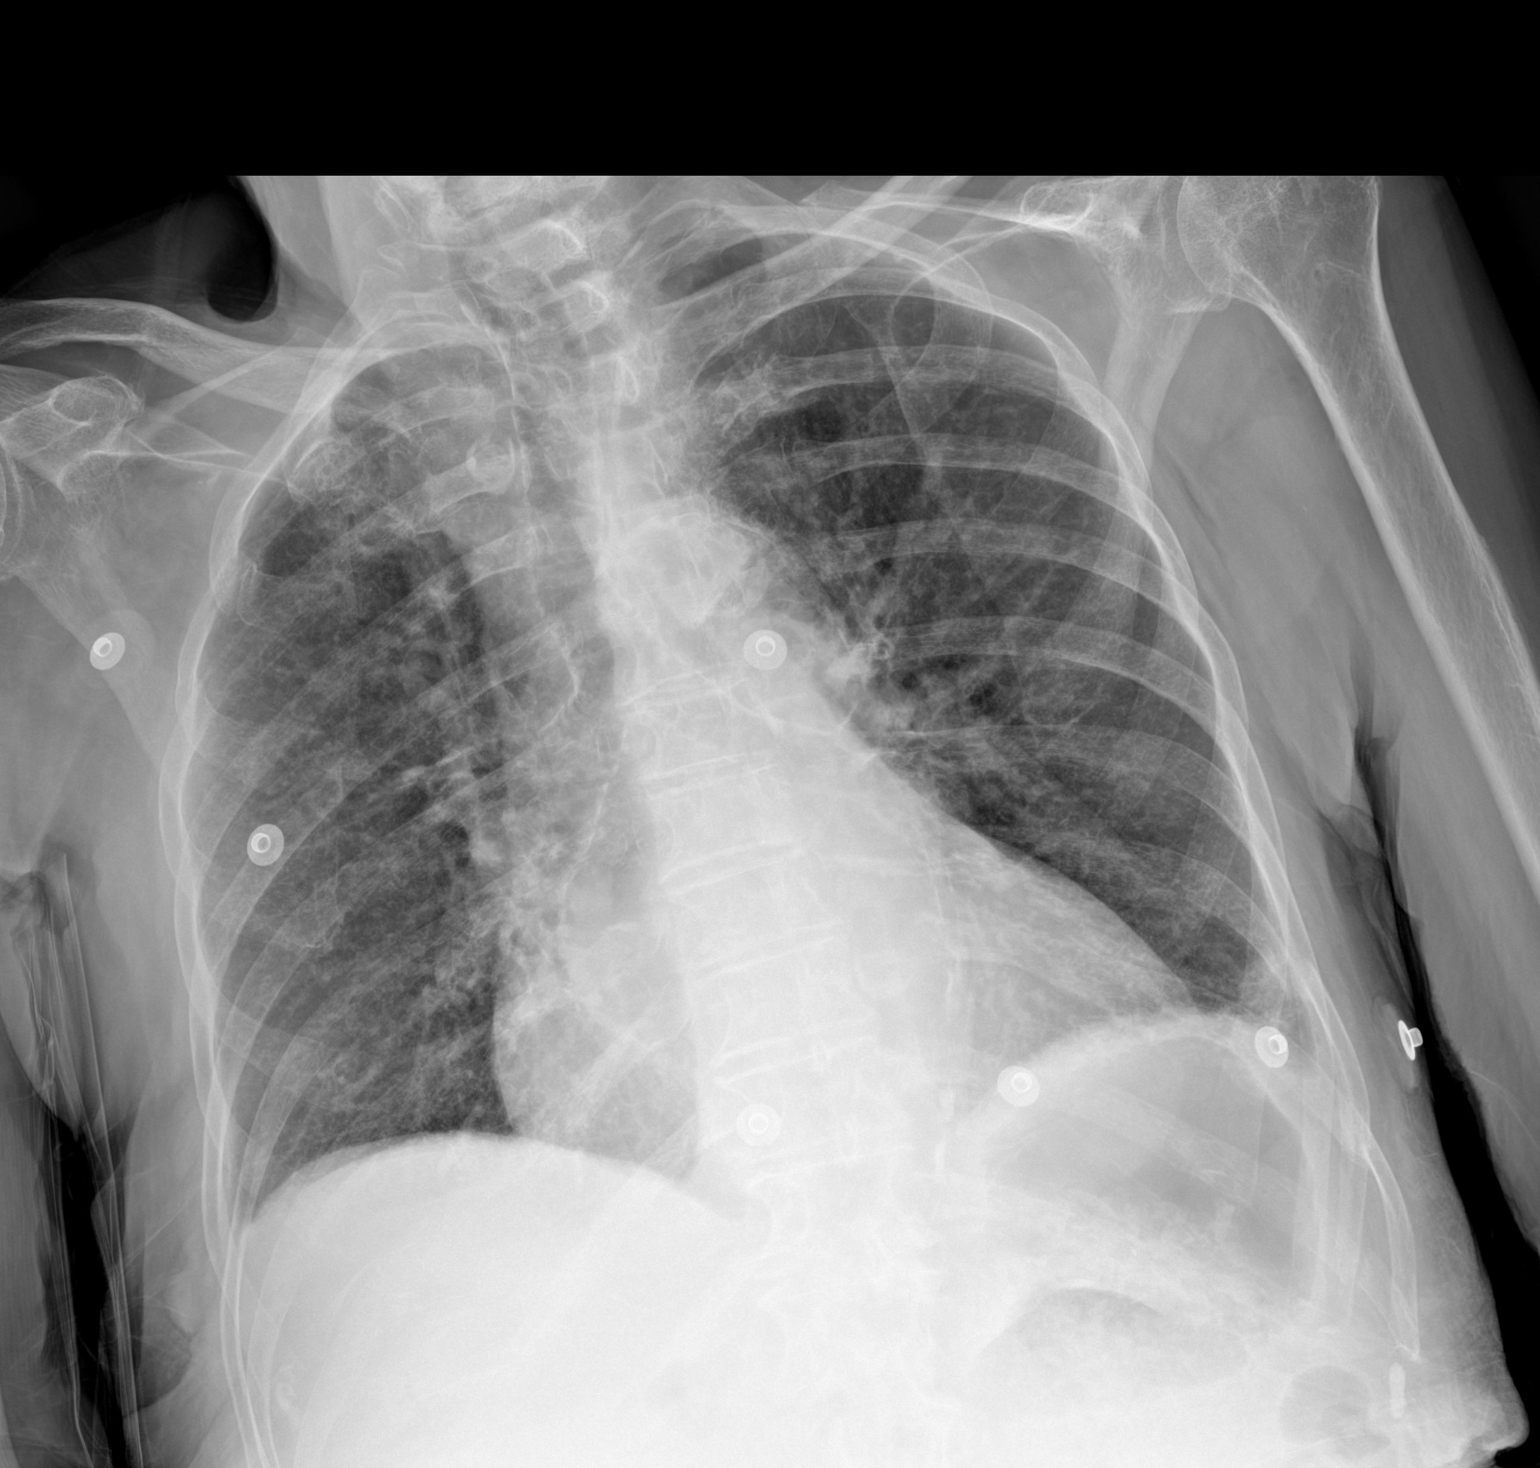

[1 of 1 positions shown; findings below may reference images not displayed]

FINDINGS: There is a nodular density seen at the a left costophrenic angle
which may represent callus formation adjacent to a subacute fracture
of the left ninth rib posterolaterally. The lungs are otherwise
clear. No pneumothorax or pleural effusion. Cardiac size is mildly
enlarged, unchanged. Mild central pulmonary vascular congestion
without overt pulmonary edema appears stable.
IMPRESSION: Stable cardiomegaly and central pulmonary venous vascular congestion
without overt pulmonary edema.

Subacute fracture of the left ninth rib. Adjacent pulmonary nodule
may represent callus related to the fracture. A follow-up chest
radiograph in 3 months would be helpful in documenting stability and
excluding an alternative etiology of the underlying pulmonary
nodule. Alternatively, this could be further assessed with dedicated
CT imaging if indicated.

## 2020-09-07 IMAGING — CT CT CERVICAL SPINE W/O CM
3 of 4 series · 12 of 33 positions shown, 14 images · non-contrast
Comparison: FLAKITO [4M]

CLINICAL DATA: Found down, scalp hematoma

EXAM:
CT CERVICAL SPINE WITHOUT CONTRAST
TECHNIQUE: Multidetector CT imaging of the cervical spine was performed without
intravenous contrast. Multiplanar CT image reconstructions were also
generated.

[Series 6: orthogonal bone · axial · 0.23mm/px · z∈[-35,+71]mm · 4 of 87 slices shown, 5 images]
[im 15/87  soft-tissue]
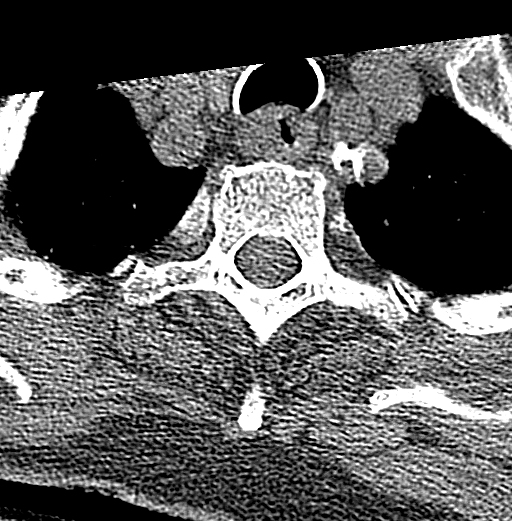
[im 15/87  bone]
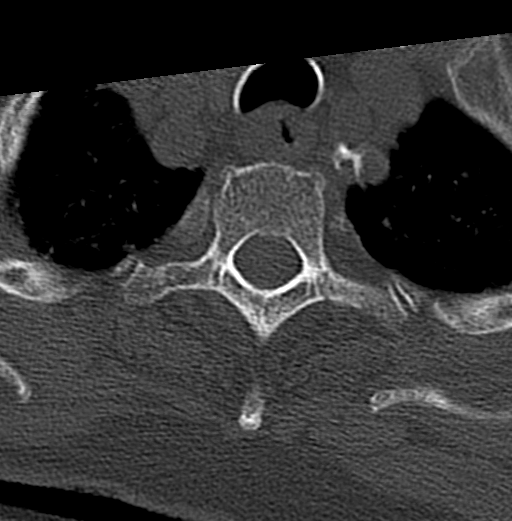
[im 29/87  bone]
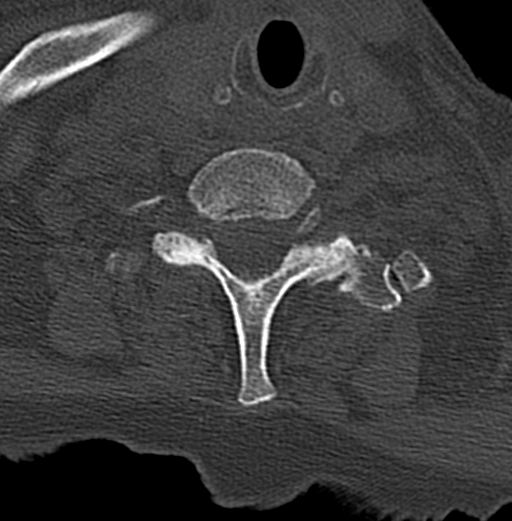
[im 58/87  bone]
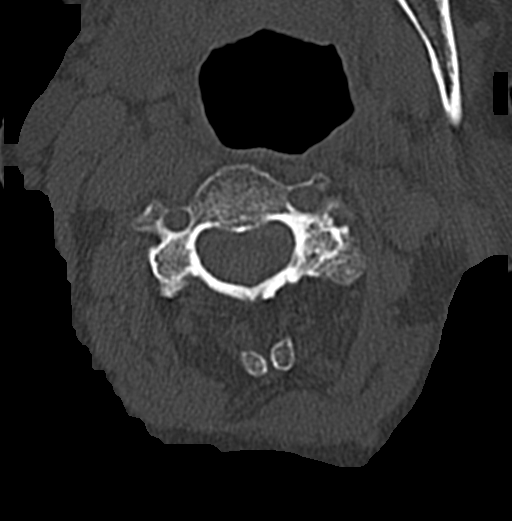
[im 72/87  bone]
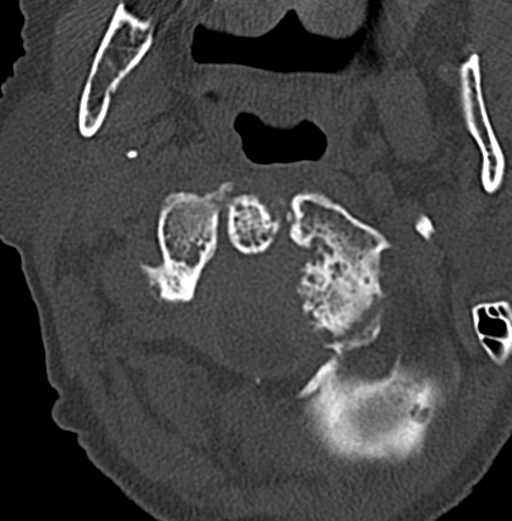

[Series 7: sagittal bone · sagittal · 0.26mm/px · 5 of 59 slices shown, 6 images]
[im 20/59  bone]
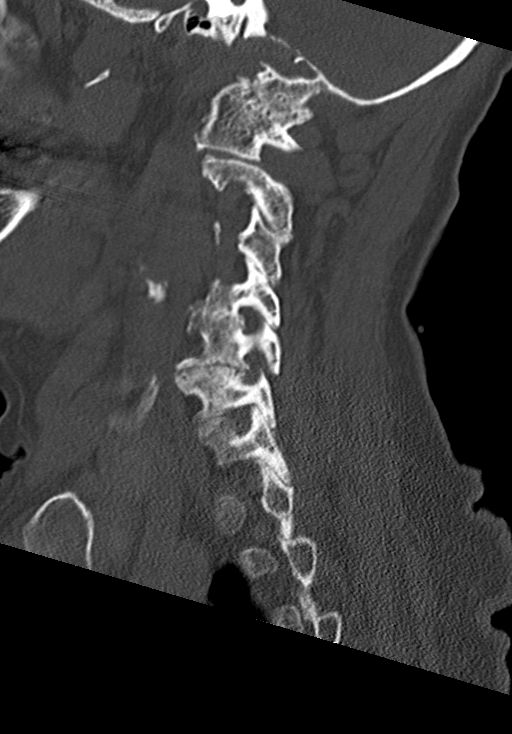
[im 25/59  bone]
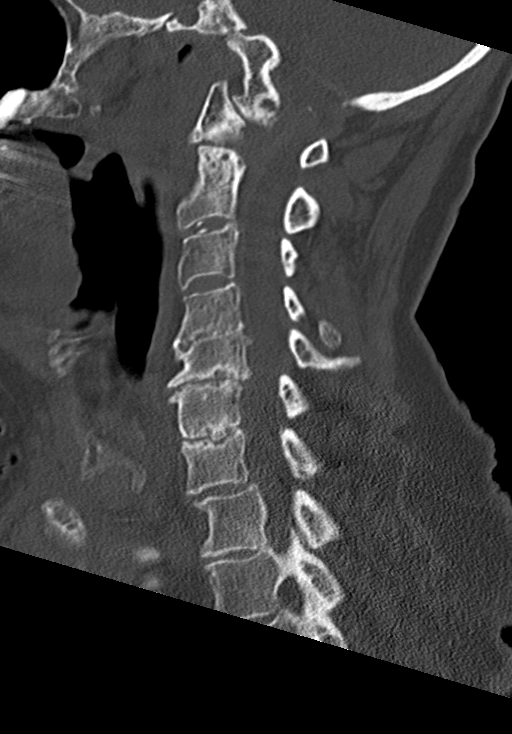
[im 30/59  soft-tissue]
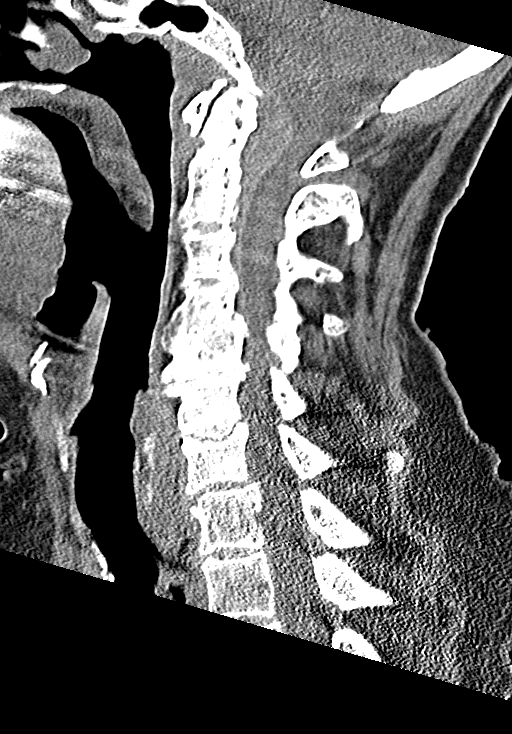
[im 30/59  bone]
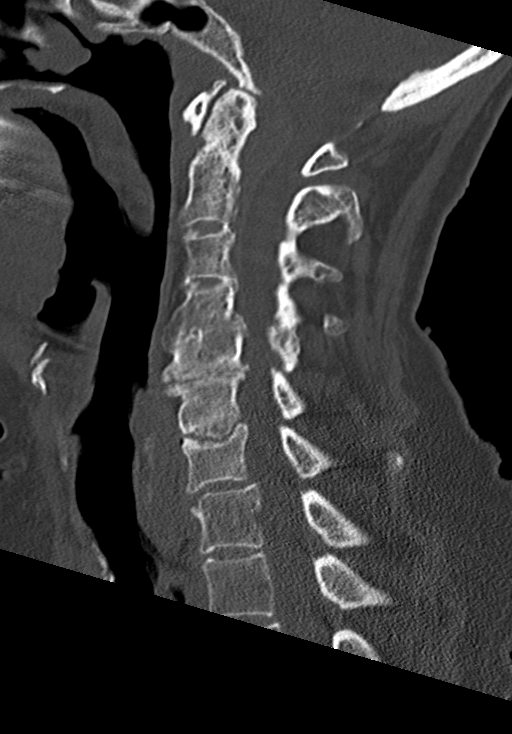
[im 34/59  bone]
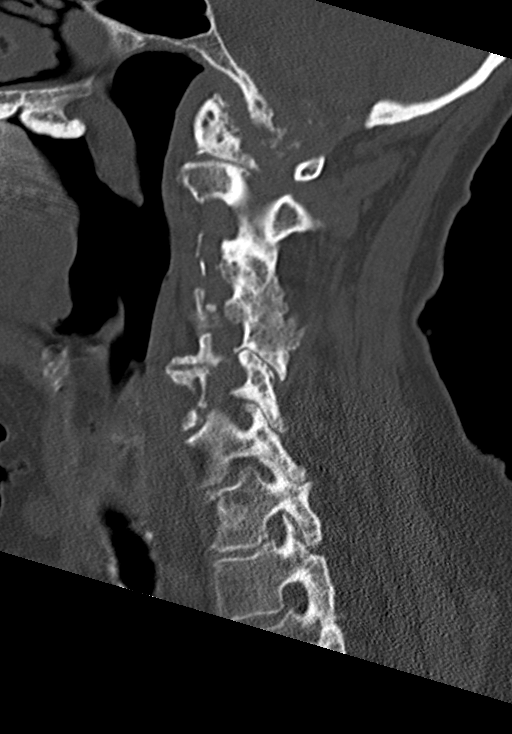
[im 39/59  bone]
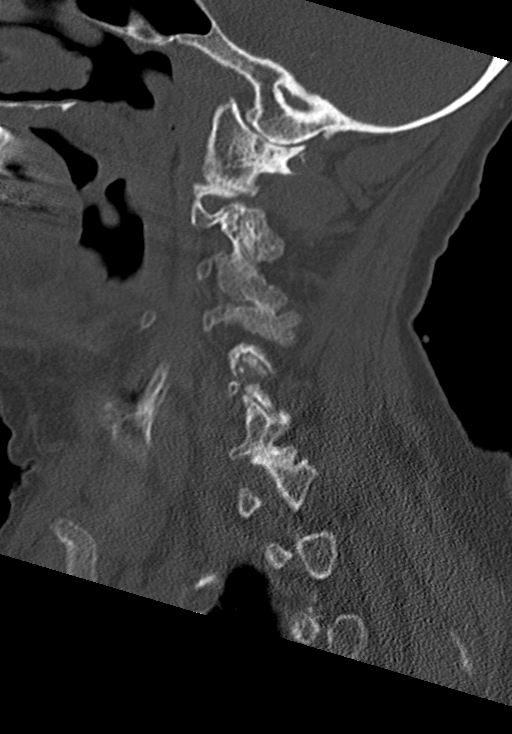

[Series 8: coronal bone · coronal · 0.23mm/px · 3 of 62 slices shown]
[im 13/62  bone]
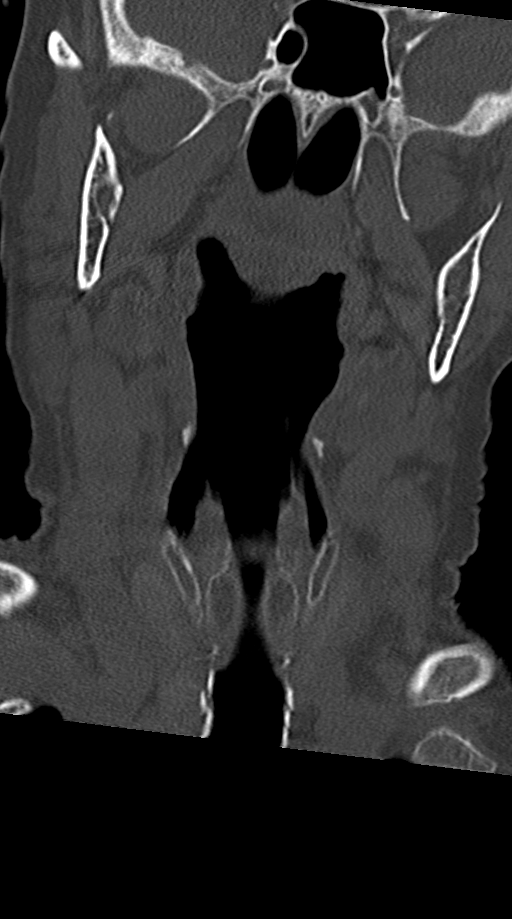
[im 25/62  bone]
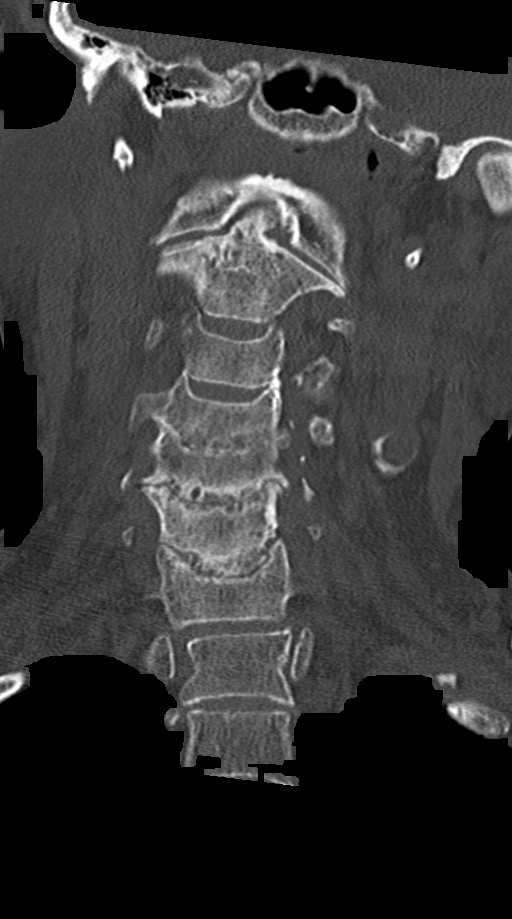
[im 37/62  bone]
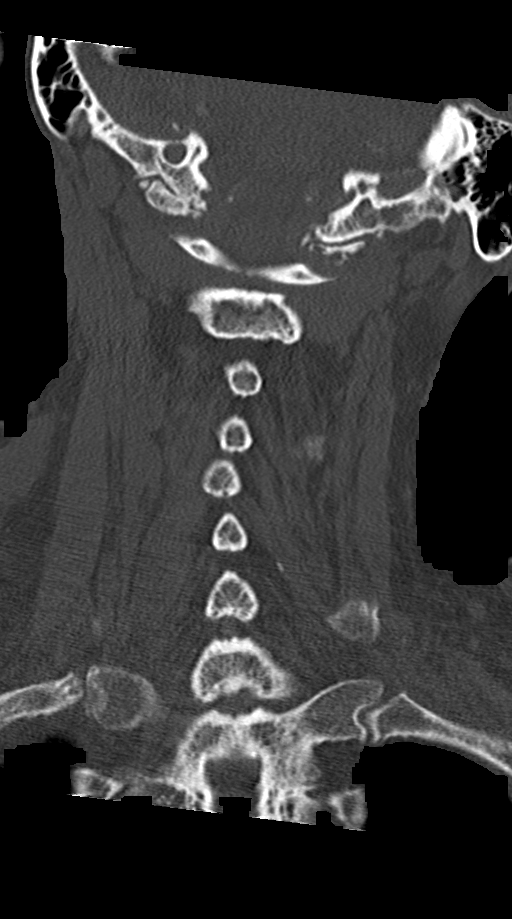

[12 of 33 positions shown; findings below may reference images not displayed]

FINDINGS: Alignment: Alignment is grossly anatomic.

Skull base and vertebrae: No acute fracture. No primary bone lesion
or focal pathologic process.

Soft tissues and spinal canal: No prevertebral fluid or swelling. No
visible canal hematoma. Stable large pannus at the C1-C2 interface
with resulting central canal stenosis again noted.

Disc levels: Stable extensive multilevel cervical spondylosis and
facet hypertrophy unchanged. Left predominant neural foraminal
encroachment throughout the cervical spine, with multilevel central
canal stenosis greatest at C4-5 and C5-6.

Upper chest: Airway is patent.  Lung apices are clear.

Other: Reconstructed images demonstrate no additional findings.
IMPRESSION: 1. No acute cervical spine fracture.
2. Stable extensive cervical spondylosis and facet hypertrophy.

## 2020-09-07 NOTE — ED Provider Notes (Signed)
[facility] Emergency Department Provider Note  ____________________________________________   None    (approximate)  I have reviewed the triage vital signs and the nursing notes.   HISTORY  Chief Complaint Fall  If complaint is fall  HPI Sheryl Suarez is a 87 y.o. female patient fell and hit her head today.  She has 2 lumps on her forehead both of which are reported to be from this fall today.  She is not having any bleeding.  She is awake alert and says she feels fine.  She has a history of congestive failure and A. fib.  She has some crackles in the bases on lung exam.  She reportedly is in her normal mental status but slightly slower than usual per the nursing home staff.      Past Medical History:  Diagnosis Date   Acute respiratory failure with hypoxia (HCC)    Arrhythmia    atrial fibrillation   Breast cancer (HCC)    remission   CHF (congestive heart failure) ([place])    Coronary artery disease    Hypertension    Sepsis due to undetermined organism ([place]) 07/16/2020   Stroke [facility])     Patient Active Problem List   Diagnosis Date Noted   Falls 09/08/2020   History of CVA (cerebrovascular accident) 09/08/2020   Goals of care, counseling/discussion 07/16/2020   DNR (do not resuscitate) 07/16/2020   Pressure ulcer 07/12/2020   Acute ischemic stroke ([place]) 07/11/2020   CHF exacerbation (HCC)    Frequent falls    Bradycardia    HLD (hyperlipidemia) 04/23/2019   UTI (urinary tract infection) 04/23/2019   Closed fracture of right olecranon process    Fall    Generalized weakness    Closed fracture dislocation of right elbow 01/15/2019   Closed rib fracture 01/15/2019   HTN (hypertension), benign 01/15/2019   CAD (coronary artery disease) 01/15/2019   Atrial fibrillation, chronic ([place]) 01/15/2019   Rhabdomyolysis 01/15/2019   GI bleed 08/18/2015    Past Surgical History:  Procedure Laterality Date   ABDOMINAL HYSTERECTOMY      APPENDECTOMY     BREAST IMPLANT EXCHANGE     CHOLECYSTECTOMY     ESOPHAGOGASTRODUODENOSCOPY (EGD) WITH PROPOFOL N/A 08/20/2015   Procedure: ESOPHAGOGASTRODUODENOSCOPY (EGD) WITH PROPOFOL;  Surgeon: Lollie Sails, MD;  Location: [facility] ENDOSCOPY;  Service: Endoscopy;  Laterality: N/A;   MASTECTOMY Bilateral    ORIF ELBOW FRACTURE Right 01/16/2019   Procedure: OPEN REDUCTION INTERNAL FIXATION (ORIF) ELBOW/OLECRANON FRACTURE;  Surgeon: Earnestine Leys, MD;  Location: [facility] ORS;  Service: Orthopedics;  Laterality: Right;    Prior to Admission medications   Medication Sig Start Date End Date Taking? Authorizing Provider  acetaminophen (TYLENOL) 325 MG tablet Take 2 tablets (650 mg total) by mouth every 4 (four) hours as needed for mild pain (or temp > 37.5 C (99.5 F)). 07/23/20   Bailey-Modzik, Delila A, NP  antiseptic oral rinse (BIOTENE) LIQD Apply 15 mLs topically as needed for dry mouth. 07/23/20   Bailey-Modzik, Delila A, NP  aspirin EC 81 MG EC tablet Take 1 tablet (81 mg total) by mouth daily. Swallow whole. 07/24/20   Bailey-Modzik, Delila A, NP  Emollient (CETAPHIL) cream Apply 1 application topically daily. Apply to bilateral legs    [provider]  lidocaine (LIDODERM) 5 % Place 1 patch onto the skin daily. Apply to lower back.    [provider]  mirtazapine (REMERON) 15 MG tablet Take 15 mg by mouth  at bedtime.    [provider]  ondansetron (ZOFRAN-ODT) 4 MG disintegrating tablet Take 1 tablet (4 mg total) by mouth every 6 (six) hours as needed for nausea. 07/23/20   Bailey-Modzik, Delila A, NP  polyvinyl alcohol (LIQUIFILM TEARS) 1.4 % ophthalmic solution Place 1 drop into both eyes 4 (four) times daily as needed for dry eyes. 07/23/20   Bailey-Modzik, Delila A, NP  senna-docusate (SENOKOT-S) 8.6-50 MG tablet Take 1 tablet by mouth at bedtime as needed for mild constipation. 07/23/20   Bailey-Modzik, Morene Crocker, NP    Allergies Patient has no known allergies.  Family  History  Problem Relation Age of Onset   CAD Mother    CAD Father     Social History Social History   Tobacco Use   Smoking status: Never   Smokeless tobacco: Never  Vaping Use   Vaping Use: Never used  Substance Use Topics   Alcohol use: No   Drug use: Never    Review of Systems  Constitutional: No fever/chills Eyes: No visual changes. ENT: No sore throat. Cardiovascular: Denies chest pain. Respiratory: Denies shortness of breath. Gastrointestinal: No abdominal pain.  No nausea, no vomiting.  No diarrhea.  No constipation. Genitourinary: Negative for dysuria. Musculoskeletal: Negative for back pain. Skin: Negative for rash. Neurological: Negative for headaches, focal weakness   ____________________________________________   PHYSICAL EXAM:  VITAL SIGNS: ED Triage Vitals  Enc Vitals Group     BP      Pulse      Resp      Temp      Temp src      SpO2      Weight      Height      Head Circumference      Peak Flow      Pain Score      Pain Loc      Pain Edu?      Excl. in [place]?    Constitutional: Alert and oriented to person and hospital. Well appearing and in no acute distress. Eyes: Conjunctivae are normal. PER EOMI. Head: 2 big lumps on the forehead which are bruised each 1 is about 3 x 4 cm on either side of the forehead Nose: No congestion/rhinnorhea. Mouth/Throat: Mucous membranes are moist.  Oropharynx non-erythematous. Neck: No stridor.   ardiovascular: Normal rate, regular rhythm.  Patient in A. fib by history and on monitor grossly normal heart sounds.  Good peripheral circulation. Respiratory: Normal respiratory effort.  No retractions. Lungs crackles in bases bilaterally Gastrointestinal: Soft and nontender. No distention. No abdominal bruits Musculoskeletal: No lower extremity tenderness slight trace edema.  Neurologic:  Normal speech and language. No gross focal neurologic deficits are appreciated.  Skin:  Skin is warm, dry and intact. No rash  noted.   ____________________________________________   LABS (all labs ordered are listed, but only abnormal results are displayed)  Labs Reviewed  COMPREHENSIVE METABOLIC PANEL - Abnormal; Notable for the following components:      Result Value   Glucose, Bld 144 (*)    BUN 25 (*)    Creatinine, Ser 1.62 (*)    Calcium 8.8 (*)    Albumin 2.9 (*)    GFR, Estimated 29 (*)    All other components within normal limits  CBC WITH DIFFERENTIAL/PLATELET - Abnormal; Notable for the following components:   WBC 10.9 (*)    Neutro Abs 8.8 (*)    All other components within normal limits  BRAIN NATRIURETIC PEPTIDE -  Abnormal; Notable for the following components:   B Natriuretic Peptide 703.6 (*)    All other components within normal limits  TROPONIN I (HIGH SENSITIVITY) - Abnormal; Notable for the following components:   Troponin I (High Sensitivity) 38 (*)    All other components within normal limits  TROPONIN I (HIGH SENSITIVITY) - Abnormal; Notable for the following components:   Troponin I (High Sensitivity) 34 (*)    All other components within normal limits  URINALYSIS, COMPLETE (UACMP) WITH MICROSCOPIC  MAGNESIUM  BASIC METABOLIC PANEL  CBC   ____________________________________________  EKG  ____________________________________________  RADIOLOGY Gertha Calkin, personally viewed and evaluated these images (plain radiographs) as part of my medical decision making, as well as reviewing the written report by the radiologist.  ED MD interpretation:    Official radiology report(s): CT show no fractures or bleeding however there appears to been progression of the MCA stroke. CT Head Wo Contrast  Result Date: 09/07/2020 CLINICAL DATA:  Found down, scalp hematoma EXAM: CT HEAD WITHOUT CONTRAST TECHNIQUE: Contiguous axial images were obtained from the base of the skull through the vertex without intravenous contrast. COMPARISON:  07/11/2020, 07/12/2020 FINDINGS: Brain:  There has been evolution of the left MCA infarct seen on previous exams. Chronic small vessel ischemic changes are again noted throughout the white matter. No acute infarct or hemorrhage. Lateral ventricles and midline structures are unremarkable. No acute extra-axial fluid collections. No mass effect. Vascular: No hyperdense vessel or unexpected calcification. Skull: There are bilateral frontal scalp hematomas. No underlying fracture. Remainder of the calvarium is unremarkable. Sinuses/Orbits: No acute finding. Other: None. IMPRESSION: 1. Bilateral frontal scalp hematomas. Otherwise no acute intracranial process. 2. Evolution of the left MCA territory infarct seen on previous exam. Stable chronic small-vessel ischemic changes elsewhere within the white matter. Electronically Signed   By: Randa Ngo M.D.   On: 09/07/2020 22:49   CT Cervical Spine Wo Contrast  Result Date: 09/07/2020 CLINICAL DATA:  Found down, scalp hematoma EXAM: CT CERVICAL SPINE WITHOUT CONTRAST TECHNIQUE: Multidetector CT imaging of the cervical spine was performed without intravenous contrast. Multiplanar CT image reconstructions were also generated. COMPARISON:  [id number] FINDINGS: Alignment: Alignment is grossly anatomic. Skull base and vertebrae: No acute fracture. No primary bone lesion or focal pathologic process. Soft tissues and spinal canal: No prevertebral fluid or swelling. No visible canal hematoma. Stable large pannus at the C1-C2 interface with resulting central canal stenosis again noted. Disc levels: Stable extensive multilevel cervical spondylosis and facet hypertrophy unchanged. Left predominant neural foraminal encroachment throughout the cervical spine, with multilevel central canal stenosis greatest at C4-5 and C5-6. Upper chest: Airway is patent.  Lung apices are clear. Other: Reconstructed images demonstrate no additional findings. IMPRESSION: 1. No acute cervical spine fracture. 2. Stable extensive cervical  spondylosis and facet hypertrophy. Electronically Signed   By: Randa Ngo M.D.   On: 09/07/2020 22:53   DG Chest Portable 1 View  Result Date: 09/07/2020 CLINICAL DATA:  Pulmonary crackles on examination EXAM: PORTABLE CHEST 1 VIEW COMPARISON:  07/16/2020 FINDINGS: There is a nodular density seen at the a left costophrenic angle which may represent callus formation adjacent to a subacute fracture of the left ninth rib posterolaterally. The lungs are otherwise clear. No pneumothorax or pleural effusion. Cardiac size is mildly enlarged, unchanged. Mild central pulmonary vascular congestion without overt pulmonary edema appears stable. IMPRESSION: Stable cardiomegaly and central pulmonary venous vascular congestion without overt pulmonary edema. Subacute fracture of the left ninth  rib. Adjacent pulmonary nodule may represent callus related to the fracture. A follow-up chest radiograph in 3 months would be helpful in documenting stability and excluding an alternative etiology of the underlying pulmonary nodule. Alternatively, this could be further assessed with dedicated CT imaging if indicated. Electronically Signed   By: Fidela Salisbury MD   On: 09/07/2020 22:13    ____________________________________________   PROCEDURES  Procedure(s) performed (including Critical Care):  Procedures   ____________________________________________   INITIAL IMPRESSION / ASSESSMENT AND PLAN / ED COURSE  Patient reportedly not quite herself at the nursing home with another fall today and 2 lumps on the head.  CT shows progression of right MCA stroke.  Patient is no code however it may be worthwhile to observe her overnight I think can get a PT OT consult in the morning to see if she needs anything else to prevent any further falling or progression of the stroke.              ____________________________________________   FINAL CLINICAL IMPRESSION(S) / ED DIAGNOSES  Final diagnoses:  Fall, initial  encounter  Elevated brain natriuretic peptide (BNP) level  Elevated serum creatinine     ED Discharge Orders     None        Note:  This document was prepared using Dragon voice recognition software and may include unintentional dictation errors.    Nena Polio, MD 09/08/20 0200

## 2020-09-07 NOTE — ED Triage Notes (Signed)
Pt is from the [facility] , where staff says they found the pt on the floor . Unknown down time. Unknown if mechanical or not .   Pt is AAOx baseline , pt does have to big Hematomas to her forehead , bleeding is controlled .   Vitals as noted in chart . ABCS appear WNL   No verbal complaints from pt

## 2020-09-08 ENCOUNTER — Encounter: Payer: Self-pay | Admitting: Internal Medicine

## 2020-09-08 ENCOUNTER — Inpatient Hospital Stay: Payer: No Typology Code available for payment source | Admitting: Adult Health

## 2020-09-08 DIAGNOSIS — Z8673 Personal history of transient ischemic attack (TIA), and cerebral infarction without residual deficits: Secondary | ICD-10-CM

## 2020-09-08 DIAGNOSIS — W19XXXS Unspecified fall, sequela: Secondary | ICD-10-CM

## 2020-09-08 DIAGNOSIS — I25810 Atherosclerosis of coronary artery bypass graft(s) without angina pectoris: Secondary | ICD-10-CM

## 2020-09-08 DIAGNOSIS — N179 Acute kidney failure, unspecified: Secondary | ICD-10-CM

## 2020-09-08 DIAGNOSIS — W19XXXA Unspecified fall, initial encounter: Secondary | ICD-10-CM | POA: Diagnosis present

## 2020-09-08 DIAGNOSIS — S0083XA Contusion of other part of head, initial encounter: Secondary | ICD-10-CM | POA: Diagnosis not present

## 2020-09-08 DIAGNOSIS — I10 Essential (primary) hypertension: Secondary | ICD-10-CM

## 2020-09-08 DIAGNOSIS — I482 Chronic atrial fibrillation, unspecified: Secondary | ICD-10-CM | POA: Diagnosis not present

## 2020-09-08 DIAGNOSIS — I509 Heart failure, unspecified: Secondary | ICD-10-CM

## 2020-09-08 DIAGNOSIS — E785 Hyperlipidemia, unspecified: Secondary | ICD-10-CM

## 2020-09-08 LAB — BASIC METABOLIC PANEL
Anion gap: 10 (ref 5–15)
BUN: 22 mg/dL (ref 8–23)
CO2: 26 mmol/L (ref 22–32)
Calcium: 8.7 mg/dL — ABNORMAL LOW (ref 8.9–10.3)
Chloride: 99 mmol/L (ref 98–111)
Creatinine, Ser: 1.55 mg/dL — ABNORMAL HIGH (ref 0.44–1.00)
GFR, Estimated: 31 mL/min — ABNORMAL LOW (ref >60)
Glucose, Bld: 96 mg/dL (ref 70–99)
Potassium: 3.8 mmol/L (ref 3.5–5.1)
Sodium: 135 mmol/L (ref 135–145)

## 2020-09-08 LAB — RESP PANEL BY RT-PCR (FLU A&B, COVID) ARPGX2
Influenza A by PCR: NEGATIVE
Influenza B by PCR: NEGATIVE
SARS Coronavirus 2 by RT PCR: NEGATIVE

## 2020-09-08 LAB — CBC
HCT: 36.7 % (ref 36.0–46.0)
Hemoglobin: 11.9 g/dL — ABNORMAL LOW (ref 12.0–15.0)
MCH: 28.4 pg (ref 26.0–34.0)
MCHC: 32.4 g/dL (ref 30.0–36.0)
MCV: 87.6 fL (ref 80.0–100.0)
Platelets: 292 10*3/uL (ref 150–400)
RBC: 4.19 MIL/uL (ref 3.87–5.11)
RDW: 14.9 % (ref 11.5–15.5)
WBC: 9 10*3/uL (ref 4.0–10.5)
nRBC: 0 % (ref 0.0–0.2)

## 2020-09-08 LAB — MAGNESIUM: Magnesium: 1.7 mg/dL (ref 1.7–2.4)

## 2020-09-08 LAB — TROPONIN I (HIGH SENSITIVITY): Troponin I (High Sensitivity): 34 ng/L — ABNORMAL HIGH (ref <18)

## 2020-09-08 MED ORDER — DILTIAZEM HCL ER COATED BEADS 120 MG PO CP24
120.0000 mg | ORAL_CAPSULE | Freq: Every day | ORAL | Status: DC
  Administered 2020-09-08: 120 mg via ORAL
  Filled 2020-09-08: qty 1

## 2020-09-08 MED ORDER — LISINOPRIL 10 MG PO TABS: 5.0000 mg | ORAL_TABLET | Freq: Every day | ORAL | Status: DC

## 2020-09-08 MED ORDER — POLYETHYLENE GLYCOL 3350 17 G PO PACK: 17.0000 g | PACK | Freq: Every day | ORAL | Status: DC | PRN

## 2020-09-08 MED ORDER — FUROSEMIDE 40 MG PO TABS
20.0000 mg | ORAL_TABLET | Freq: Every day | ORAL | Status: DC
  Administered 2020-09-08: 20 mg via ORAL
  Filled 2020-09-08: qty 1

## 2020-09-08 MED ORDER — MIRTAZAPINE 15 MG PO TABS: 15.0000 mg | ORAL_TABLET | Freq: Every day | ORAL | Status: DC

## 2020-09-08 MED ORDER — ACETAMINOPHEN 325 MG PO TABS: 650.0000 mg | ORAL_TABLET | Freq: Four times a day (QID) | ORAL | Status: DC | PRN

## 2020-09-08 MED ORDER — FUROSEMIDE 40 MG PO TABS: 20.0000 mg | ORAL_TABLET | Freq: Once | ORAL | Status: DC

## 2020-09-08 MED ORDER — ACETAMINOPHEN 650 MG RE SUPP: 650.0000 mg | Freq: Four times a day (QID) | RECTAL | Status: DC | PRN

## 2020-09-08 MED ORDER — ASPIRIN EC 81 MG PO TBEC
81.0000 mg | DELAYED_RELEASE_TABLET | Freq: Every day | ORAL | Status: DC
  Administered 2020-09-08: 81 mg via ORAL
  Filled 2020-09-08: qty 1

## 2020-09-08 MED ORDER — SODIUM CHLORIDE 0.9% FLUSH
3.0000 mL | Freq: Two times a day (BID) | INTRAVENOUS | Status: DC
  Administered 2020-09-08 (×2): 3 mL via INTRAVENOUS

## 2020-09-08 MED ORDER — ENOXAPARIN SODIUM 30 MG/0.3ML IJ SOSY: 30.0000 mg | PREFILLED_SYRINGE | INTRAMUSCULAR | Status: DC

## 2020-09-08 MED ORDER — POLYVINYL ALCOHOL 1.4 % OP SOLN
1.0000 [drp] | Freq: Four times a day (QID) | OPHTHALMIC | Status: DC | PRN
  Filled 2020-09-08: qty 15

## 2020-09-08 NOTE — Evaluation (Signed)
Occupational Therapy Evaluation Patient Details Name: Sheryl Suarez MRN: [id number] DOB: [birth year] Today's Date: 09/08/2020    History of Present Illness 87 y.o. female with medical history significant of CHF, CVA, A. fib, falls, hyperlipidemia, hypertension, breast cancer, CAD who presents after a fall at her nursing home.  As above patient fell at her nursing home earlier today.  Fall was unwitnessed and she was found on the floor by staff.  Unclear if it was mechanical.  She is alert and oriented.  And she is at her baseline per reports however it has been reported that the staff stated she has been slower in her cognition than usual. New hematoma to head and L rib fx.   Clinical Impression   Patient presenting with decreased I in self care, balance, functional mobility/transfer, endurance, and safety awareness. Per chart review, pt at SNF since CVA 06/2020. Pt is poor historian and no family present to determine baseline. Pt is overall min HHA for bed mobility. She stood with min A to doff soiled brief and therapist assisted her with hygiene and clothing management. Pt asking several times where she was and unable to remember what therapist told her last time she asked that question. She is pleasant and cooperative overall. Patient will benefit from acute OT to increase overall independence in the areas of ADLs, functional mobility, and safety awareness in order to safely discharge to next venue of care.      Follow Up Recommendations  SNF;Supervision - Intermittent    Equipment Recommendations  Other (comment) (defer to next venue of care)       Precautions / Restrictions Precautions Precautions: Fall      Mobility Bed Mobility Overal bed mobility: Needs Assistance Bed Mobility: Supine to Sit;Sit to Supine     Supine to sit: Min guard Sit to supine: Min guard   General bed mobility comments: no physical assist needed but min guard for safety    Transfers Overall transfer  level: Needs assistance Equipment used: 1 person hand held assist Transfers: Sit to/from [facility] Sit to Stand: Min guard Stand pivot transfers: Min assist            Balance Overall balance assessment: Needs assistance Sitting-balance support: Feet unsupported;Bilateral upper extremity supported Sitting balance-Leahy Scale: Fair     Standing balance support: During functional activity Standing balance-Leahy Scale: Fair                             ADL either performed or assessed with clinical judgement   ADL Overall ADL's : Needs assistance/impaired     Grooming: Wash/dry hands;Sitting;Set up               Lower Body Dressing: Sit to/from stand;Moderate assistance               Functional mobility during ADLs: Min guard;Minimal assistance General ADL Comments: min guard - min A for HHA within the room. Pt standing with min guard for balance while therapist assists pt with changing soiled brief and donning clean items.     Vision Patient Visual Report: No change from baseline              Pertinent Vitals/Pain Pain Assessment: Faces Faces Pain Scale: No hurt     Hand Dominance Right   Extremity/Trunk Assessment Upper Extremity Assessment Upper Extremity Assessment: Generalized weakness   Lower Extremity Assessment Lower Extremity Assessment: Generalized weakness  Communication Communication Communication: HOH   Cognition Arousal/Alertness: Awake/alert Behavior During Therapy: Flat affect;Restless Overall Cognitive Status: History of cognitive impairments - at baseline                                 General Comments: Pt oriented to self only this session and is overall pleasant and cooperative. She follows 1 step commands with increased time.              Home Living Family/patient expects to be discharged to:: Skilled nursing facility                                         Prior Functioning/Environment Level of Independence: Independent with assistive device(s)        Comments: No family present on evaluation. Pt poor historian secondary to hx of dementia and increased confusion per staff. Per chart review, pt living in ALF until 06/2020 when she had CVA and discharged from hospital to SNF where she has been until this hospital admission.        OT Problem List: Decreased strength;Decreased activity tolerance;Impaired balance (sitting and/or standing);Decreased safety awareness;Decreased knowledge of use of DME or AE;Decreased knowledge of precautions;Cardiopulmonary status limiting activity;Decreased cognition      OT Treatment/Interventions: Self-care/ADL training;Manual therapy;Therapeutic exercise;Modalities;Patient/family education;Balance training;Energy conservation;Therapeutic activities;DME and/or AE instruction;Cognitive remediation/compensation    OT Goals(Current goals can be found in the care plan section) Acute Rehab OT Goals Patient Stated Goal: to get better OT Goal Formulation: With patient Time For Goal Achievement: 09/22/20 Potential to Achieve Goals: Fair  OT Frequency: Min 2X/week   Barriers to D/C:    none known at this time          AM-PAC OT "6 Clicks" Daily Activity     Outcome Measure Help from another person eating meals?: A Little Help from another person taking care of personal grooming?: A Little Help from another person toileting, which includes using toliet, bedpan, or urinal?: A Little Help from another person bathing (including washing, rinsing, drying)?: A Little Help from another person to put on and taking off regular upper body clothing?: None Help from another person to put on and taking off regular lower body clothing?: A Little 6 Click Score: 19   End of Session Nurse Communication: Mobility status  Activity Tolerance: Patient tolerated treatment well Patient left: in bed;with call bell/phone  within reach;with bed alarm set  OT Visit Diagnosis: Unsteadiness on feet (R26.81);Repeated falls (R29.6);Muscle weakness (generalized) (M62.81);History of falling (Z91.81)                Time: 1448-1856 OT Time Calculation (min): 20 min Charges:  OT General Charges $OT Visit: 1 Visit OT Evaluation $OT Eval Low Complexity: 1 Low OT Treatments $Self Care/Home Management : 8-22 mins Darleen Crocker, MS, OTR/L , CBIS ascom [phone]  09/08/20, 1:02 PM

## 2020-09-08 NOTE — Discharge Summary (Signed)
Physician Discharge Summary  Patient ID: Sheryl Suarez MRN: [id number] DOB/AGE: [birth year] 87 y.o.  Admit date: 09/07/2020 Discharge date: 09/08/2020  Admission Diagnoses:  Discharge Diagnoses:  Principal Problem:   Falls Active Problems:   HTN (hypertension), benign   CAD (coronary artery disease)   Atrial fibrillation, chronic (HCC)   HLD (hyperlipidemia)   History of CVA (cerebrovascular accident) Chronic diastolic congestive heart failure. Chronic persistent atrial fibrillation. Acute kidney injury.  Discharged Condition: fair  Hospital Course:  Sheryl Suarez is a 87 y.o. female with medical history significant of CHF, CVA, A. fib, falls, hyperlipidemia, hypertension, breast cancer, CAD who presents after a fall at her nursing home. As above patient fell at her nursing home earlier today.  Fall was unwitnessed and she was found on the floor by staff.  Unclear if it was mechanical.  She is alert and oriented.  And she is at her baseline per reports however it has been reported that the staff stated she has been slower in her cognition than usual. She reports she is not sure what happened at the time of the fall. Patient monitored through the day, currently she feels well.  She has multiple facial bruises, but no facial areas.  Her renal function is better, she does not have evidence of acute exacerbation congestive heart failure.  At this point, she is medically stable to be discharged.  Consults: None  Significant Diagnostic Studies:  CT HEAD WITHOUT CONTRAST   TECHNIQUE: Contiguous axial images were obtained from the base of the skull through the vertex without intravenous contrast.   COMPARISON:  07/11/2020, 07/12/2020   FINDINGS: Brain: There has been evolution of the left MCA infarct seen on previous exams. Chronic small vessel ischemic changes are again noted throughout the white matter.   No acute infarct or hemorrhage. Lateral ventricles and midline structures  are unremarkable. No acute extra-axial fluid collections. No mass effect.   Vascular: No hyperdense vessel or unexpected calcification.   Skull: There are bilateral frontal scalp hematomas. No underlying fracture. Remainder of the calvarium is unremarkable.   Sinuses/Orbits: No acute finding.   Other: None.   IMPRESSION: 1. Bilateral frontal scalp hematomas. Otherwise no acute intracranial process. 2. Evolution of the left MCA territory infarct seen on previous exam. Stable chronic small-vessel ischemic changes elsewhere within the white matter.     Electronically Signed   By: Randa Ngo M.D.   On: 09/07/2020 22:49  CT CERVICAL SPINE WITHOUT CONTRAST   TECHNIQUE: Multidetector CT imaging of the cervical spine was performed without intravenous contrast. Multiplanar CT image reconstructions were also generated.   COMPARISON:  [facility]   FINDINGS: Alignment: Alignment is grossly anatomic.   Skull base and vertebrae: No acute fracture. No primary bone lesion or focal pathologic process.   Soft tissues and spinal canal: No prevertebral fluid or swelling. No visible canal hematoma. Stable large pannus at the C1-C2 interface with resulting central canal stenosis again noted.   Disc levels: Stable extensive multilevel cervical spondylosis and facet hypertrophy unchanged. Left predominant neural foraminal encroachment throughout the cervical spine, with multilevel central canal stenosis greatest at C4-5 and C5-6.   Upper chest: Airway is patent.  Lung apices are clear.   Other: Reconstructed images demonstrate no additional findings.   IMPRESSION: 1. No acute cervical spine fracture. 2. Stable extensive cervical spondylosis and facet hypertrophy.     Electronically Signed   By: Randa Ngo M.D.   On: 09/07/2020 22:53  Treatments: Clinical observation  Discharge Exam: Blood pressure 135/64, pulse 95, temperature 98.3 F (36.8 C), temperature source  Axillary, resp. rate 18, weight 54.4 kg, SpO2 96 %. General appearance: alert, cooperative, and oriented x2, frail Resp: clear to auscultation bilaterally Cardio: irregular, no murmurs GI: soft, non-tender; bowel sounds normal; no masses,  no organomegaly Extremities: extremities normal, atraumatic, no cyanosis or edema  Disposition: Discharge disposition: 03-Skilled Nursing Facility       Discharge Instructions     Diet - low sodium heart healthy   Complete by: As directed    Increase activity slowly   Complete by: As directed       Allergies as of 09/08/2020   No Known Allergies      Medication List     STOP taking these medications    antiseptic oral rinse Liqd       TAKE these medications    acetaminophen 325 MG tablet Commonly known as: TYLENOL Take 2 tablets (650 mg total) by mouth every 4 (four) hours as needed for mild pain (or temp > 37.5 C (99.5 F)).   aspirin 81 MG EC tablet Take 1 tablet (81 mg total) by mouth daily. Swallow whole.   cetaphil cream Apply 1 application topically daily. Apply to bilateral legs   diltiazem 120 MG 24 hr capsule Commonly known as: CARDIZEM CD Take 120 mg by mouth daily.   furosemide 20 MG tablet Commonly known as: LASIX Take 20 mg by mouth daily.   lidocaine 5 % Commonly known as: LIDODERM Place 1 patch onto the skin daily. Apply to lower back.   lisinopril 5 MG tablet Commonly known as: ZESTRIL Take 5 mg by mouth daily.   mirtazapine 15 MG tablet Commonly known as: REMERON Take 15 mg by mouth at bedtime.   ondansetron 4 MG disintegrating tablet Commonly known as: ZOFRAN-ODT Take 1 tablet (4 mg total) by mouth every 6 (six) hours as needed for nausea.   polyvinyl alcohol 1.4 % ophthalmic solution Commonly known as: LIQUIFILM TEARS Place 1 drop into both eyes 4 (four) times daily as needed for dry eyes.   senna-docusate 8.6-50 MG tablet Commonly known as: Senokot-S Take 1 tablet by mouth at bedtime  as needed for mild constipation.        Follow-up Information     Leonel Ramsay, MD Follow up in 1 week(s).   Specialty: Infectious Diseases Contact information: [place] [address] [phone]                 Signed: Sharen Hones 09/08/2020, 1:46 PM

## 2020-09-08 NOTE — ED Notes (Signed)
EKG obtained , warm blanket and pillow given . Call bell in reach

## 2020-09-08 NOTE — Evaluation (Signed)
Physical Therapy Evaluation Patient Details Name: MEYGAN KYSER MRN: [id number] DOB: 06/11/1923 Today's Date: 09/08/2020   History of Present Illness  87 y.o. female with medical history significant of CHF, CVA, A. fib, falls, hyperlipidemia, hypertension, breast cancer, CAD who presents after a fall at her nursing home.  As above patient fell at her nursing home earlier today.  Fall was unwitnessed and she was found on the floor by staff.  Unclear if it was mechanical.  She is alert and oriented.  And she is at her baseline per reports however it has been reported that the staff stated she has been slower in her cognition than usual. New hematoma to head and L rib fx.  Clinical Impression  Pt initially hesitant to doing a lot with PT, but was relatively easily convinced to get up and work with minimal cuing.  She was able to ambulate ~80 ft in the hallway with walker and close supervision and consistent cuing.  She did not have any overt LOBs but her mental status was problematic regarding safety awareness.  She still reported living at home (has been at ALF then rehab) but again did participate.  Good effort, poor awareness, will benefit from continued rehab when medically ready for d/c.    Follow Up Recommendations SNF;Supervision - Intermittent    Equipment Recommendations       Recommendations for Other Services       Precautions / Restrictions Precautions Precautions: Fall Restrictions Weight Bearing Restrictions: No      Mobility  Bed Mobility Overal bed mobility: Modified Independent Bed Mobility: Supine to Sit;Sit to Supine     Supine to sit: Min guard Sit to supine: Min guard   General bed mobility comments: no physical assist needed but min guard for safety    Transfers Overall transfer level: Needs assistance Equipment used: Rolling walker (2 wheeled) Transfers: Sit to/from Stand Sit to Stand: Min guard Stand pivot transfers: Min assist       General  transfer comment: cuing to insure appropriate use of walker, but good overall confidence and ability to transition to standing  Ambulation/Gait Ambulation/Gait assistance: Min guard Gait Distance (Feet): 85 Feet Assistive device: Rolling walker (2 wheeled)       General Gait Details: Pt able to ambulate into the hallway with good relative confidence, thoughs he did fatigue, was reliant on the walker and needed consistent cuing to insure safety with the effort.  Her O2 (on room air) went from the high 90s to mid 80s with the moderate distance and she did have increased HR into the 110s.  No overt LOBs but generally poor safety awareness wtih shuffling steps and inconsistent positioning with the walker.  Stairs            Wheelchair Mobility    Modified Rankin (Stroke Patients Only)       Balance Overall balance assessment: Needs assistance Sitting-balance support: Feet unsupported;Bilateral upper extremity supported Sitting balance-Leahy Scale: Fair     Standing balance support: During functional activity Standing balance-Leahy Scale: Fair Standing balance comment: reliant on UEs to maintain balance, inconsistent with walker use with no overt LOBs or unsteadiness with standing/ambulation                             Pertinent Vitals/Pain Pain Assessment: No/denies pain (despite multiple facial contusions) Faces Pain Scale: No hurt    Home Living Family/patient expects to be discharged to:: Skilled nursing  facility                      Prior Function Level of Independence: Independent with assistive device(s)         Comments: No family present on evaluation. Pt poor historian secondary to hx of dementia and increased confusion per staff. Per chart review, pt living in ALF until 06/2020 when she had CVA and discharged from hospital to SNF where she has been until this hospital admission.     Hand Dominance   Dominant Hand: Right     Extremity/Trunk Assessment   Upper Extremity Assessment Upper Extremity Assessment: Generalized weakness (age appropriate limiations with R shoulder elevation ~70, L ~90)    Lower Extremity Assessment Lower Extremity Assessment: Generalized weakness;Difficult to assess due to impaired cognition (age appropriate limitations, did not follow instructions particularly well during attempts at testing)       Communication   Communication: [facility]  Cognition Arousal/Alertness: Awake/alert Behavior During Therapy: Restless Overall Cognitive Status: History of cognitive impairments - at baseline                                 General Comments: Pt oriented to self only this session and is overall pleasant and cooperative. She follows 1 step commands with increased time.      General Comments      Exercises     Assessment/Plan    PT Assessment Patient needs continued PT services  PT Problem List Decreased strength;Decreased activity tolerance;Decreased range of motion;Decreased balance;Decreased mobility;Decreased safety awareness;Decreased knowledge of use of DME;Decreased cognition;Decreased knowledge of precautions;Cardiopulmonary status limiting activity       PT Treatment Interventions DME instruction;Gait training;Stair training;Functional mobility training;Therapeutic activities;Therapeutic exercise;Balance training;Cognitive remediation;Patient/family education;Neuromuscular re-education    PT Goals (Current goals can be found in the Care Plan section)  Acute Rehab PT Goals Patient Stated Goal: none stated PT Goal Formulation: With patient Time For Goal Achievement: 09/22/20 Potential to Achieve Goals: Fair    Frequency Min 2X/week   Barriers to discharge        Co-evaluation               AM-PAC PT "6 Clicks" Mobility  Outcome Measure Help needed turning from your back to your side while in a flat bed without using bedrails?: A Little Help needed  moving from lying on your back to sitting on the side of a flat bed without using bedrails?: A Little Help needed moving to and from a bed to a chair (including a wheelchair)?: A Little Help needed standing up from a chair using your arms (e.g., wheelchair or bedside chair)?: A Little Help needed to walk in hospital room?: A Lot Help needed climbing 3-5 steps with a railing? : Total 6 Click Score: 15    End of Session Equipment Utilized During Treatment: Gait belt Activity Tolerance: Patient limited by fatigue;Patient tolerated treatment well Patient left: in bed;with call bell/phone within reach Nurse Communication: Mobility status PT Visit Diagnosis: Unsteadiness on feet (R26.81);Muscle weakness (generalized) (M62.81);Difficulty in walking, not elsewhere classified (R26.2)    Time: 1000-1027 PT Time Calculation (min) (ACUTE ONLY): 27 min   Charges:   PT Evaluation $PT Eval Low Complexity: 1 Low PT Treatments $Gait Training: 8-22 mins        Kreg Shropshire, DPT 09/08/2020, 11:34 AM

## 2020-09-08 NOTE — ED Notes (Signed)
[name] daughter updated at bedside.

## 2020-09-08 NOTE — ED Notes (Signed)
[facility] called for transport back to [place].

## 2020-09-08 NOTE — H&P (Addendum)
History and Physical   [name] QBV:[id number] DOB: 08/17/1923 DOA: 09/07/2020  PCP: Leonel Ramsay, MD  Patient coming from: [place] home  Chief Complaint: Fall  HPI: Sheryl Suarez is a 87 y.o. female with medical history significant of CHF, CVA, A. fib, falls, hyperlipidemia, hypertension, breast cancer, CAD who presents after a fall at her nursing home. As above patient fell at her nursing home earlier today.  Fall was unwitnessed and she was found on the floor by staff.  Unclear if it was mechanical.  She is alert and oriented.  And she is at her baseline per reports however it has been reported that the staff stated she has been slower in her cognition than usual. She reports she is not sure what happened at the time of the fall. She denies fevers, chills, chest pain, shortness of breath, abdominal pain, constipation, diarrhea, nausea, vomiting.  ED Course: Vital signs in the ED significant for heart rate in the 100s initially but improved to the 90s.  Blood pressure in the 280K to 349Z systolic.  Saturating well on room air.  Lab work-up showed CMP with creatinine elevated to 1.62 from baseline of 0.9.  Glucose 144, BUN 25.  Calcium 8.8 which corrects considering albumin of 2.9.  CBC with mild leukocytosis to 10.9.  BNP elevated to 703.  Troponin mildly elevated to 38 but flat on repeat at 34.  Urinalysis pending.  Chest x-ray showed stable cardiomegaly with pulmonary venous congestion and subacute ninth rib fracture.  CT head showed bilateral hematoma and evolution of previously demonstrated MCA infarct.  CT C-spine was without acute abnormality.  Review of Systems: As per HPI otherwise all other systems reviewed and are negative.  Past Medical History:  Diagnosis Date   Acute respiratory failure with hypoxia (HCC)    Arrhythmia    atrial fibrillation   Breast cancer (HCC)    remission   CHF (congestive heart failure) (HCC)    Coronary artery disease     Hypertension    Sepsis due to undetermined organism ([place]) 07/16/2020   Stroke [facility])     Past Surgical History:  Procedure Laterality Date   ABDOMINAL HYSTERECTOMY     APPENDECTOMY     BREAST IMPLANT EXCHANGE     CHOLECYSTECTOMY     ESOPHAGOGASTRODUODENOSCOPY (EGD) WITH PROPOFOL N/A 08/20/2015   Procedure: ESOPHAGOGASTRODUODENOSCOPY (EGD) WITH PROPOFOL;  Surgeon: Lollie Sails, MD;  Location[facility] ENDOSCOPY;  Service: Endoscopy;  Laterality: N/A;   MASTECTOMY Bilateral    ORIF ELBOW FRACTURE Right 01/16/2019   Procedure: OPEN REDUCTION INTERNAL FIXATION (ORIF) ELBOW/OLECRANON FRACTURE;  Surgeon: Earnestine Leys, MD;  Location: [facility] ORS;  Service: Orthopedics;  Laterality: Right;    Social History  reports that she has never smoked. She has never used smokeless tobacco. She reports that she does not drink alcohol and does not use drugs.  No Known Allergies  Family History  Problem Relation Age of Onset   CAD Mother    CAD Father   Reviewed on admission  Prior to Admission medications   Medication Sig Start Date End Date Taking? Authorizing Provider  acetaminophen (TYLENOL) 325 MG tablet Take 2 tablets (650 mg total) by mouth every 4 (four) hours as needed for mild pain (or temp > 37.5 C (99.5 F)). 07/23/20   Bailey-Modzik, Delila A, NP  antiseptic oral rinse (BIOTENE) LIQD Apply 15 mLs topically as needed for dry mouth. 07/23/20   Bailey-Modzik, Mayo Ao A, NP  aspirin EC 81  MG EC tablet Take 1 tablet (81 mg total) by mouth daily. Swallow whole. 07/24/20   Bailey-Modzik, Delila A, NP  Emollient (CETAPHIL) cream Apply 1 application topically daily. Apply to bilateral legs    [provider]  lidocaine (LIDODERM) 5 % Place 1 patch onto the skin daily. Apply to lower back.    [provider]  mirtazapine (REMERON) 15 MG tablet Take 15 mg by mouth at bedtime.    [provider]  ondansetron (ZOFRAN-ODT) 4 MG disintegrating tablet Take 1 tablet (4 mg total) by mouth  every 6 (six) hours as needed for nausea. 07/23/20   Bailey-Modzik, Delila A, NP  polyvinyl alcohol (LIQUIFILM TEARS) 1.4 % ophthalmic solution Place 1 drop into both eyes 4 (four) times daily as needed for dry eyes. 07/23/20   Bailey-Modzik, Delila A, NP  senna-docusate (SENOKOT-S) 8.6-50 MG tablet Take 1 tablet by mouth at bedtime as needed for mild constipation. 07/23/20   Hetty Blend, NP    Physical Exam: Vitals:   09/07/20 2206 09/07/20 2335 09/07/20 2339  BP: (!) 168/86 (!) 126/56 (!) 126/50  Pulse: (!) 115 98 98  Resp: 18  17  Temp: 98.3 F (36.8 C)    TempSrc: Oral    SpO2: 100% 96% 97%    Physical Exam Constitutional:      General: She is not in acute distress.    Appearance: Normal appearance.  HENT:     Head:     Comments: Bruising to her right face and bilateral forehead.    Mouth/Throat:     Mouth: Mucous membranes are moist.     Pharynx: Oropharynx is clear.  Eyes:     Extraocular Movements: Extraocular movements intact.     Pupils: Pupils are equal, round, and reactive to light.  Cardiovascular:     Rate and Rhythm: Tachycardia present. Rhythm irregular.     Pulses: Normal pulses.     Heart sounds: Normal heart sounds.  Pulmonary:     Effort: Pulmonary effort is normal. No respiratory distress.     Breath sounds: Rales present.  Abdominal:     General: Bowel sounds are normal. There is no distension.     Palpations: Abdomen is soft.     Tenderness: There is no abdominal tenderness.  Musculoskeletal:        General: No swelling or deformity.  Skin:    General: Skin is warm and dry.  Neurological:     General: No focal deficit present.     Mental Status: Mental status is at baseline.   Labs on Admission: I have personally reviewed following labs and imaging studies  CBC: Recent Labs  Lab 09/07/20 2156  WBC 10.9*  NEUTROABS 8.8*  HGB 13.0  HCT 40.0  MCV 90.3  PLT 106    Basic Metabolic Panel: Recent Labs  Lab 09/07/20 2156  NA 135   K 3.7  CL 98  CO2 28  GLUCOSE 144*  BUN 25*  CREATININE 1.62*  CALCIUM 8.8*    GFR: CrCl cannot be calculated (Unknown ideal weight.).  Liver Function Tests: Recent Labs  Lab 09/07/20 2156  AST 40  ALT 17  ALKPHOS 83  BILITOT 0.8  PROT 6.6  ALBUMIN 2.9*    Urine analysis:    Component Value Date/Time   COLORURINE AMBER (A) 07/16/2020 1144   APPEARANCEUR HAZY (A) 07/16/2020 1144   LABSPEC 1.014 07/16/2020 1144   PHURINE 6.0 07/16/2020 1144   [place] 07/16/2020 1144  HGBUR LARGE (A) 07/16/2020 1144   BILIRUBINUR NEGATIVE 07/16/2020 [place] 07/16/2020 1144   PROTEINUR 100 (A) 07/16/2020 1144   NITRITE NEGATIVE 07/16/2020 1144   LEUKOCYTESUR LARGE (A) 07/16/2020 1144    Radiological Exams on Admission: CT Head Wo Contrast  Result Date: 09/07/2020 CLINICAL DATA:  Found down, scalp hematoma EXAM: CT HEAD WITHOUT CONTRAST TECHNIQUE: Contiguous axial images were obtained from the base of the skull through the vertex without intravenous contrast. COMPARISON:  07/11/2020, 07/12/2020 FINDINGS: Brain: There has been evolution of the left MCA infarct seen on previous exams. Chronic small vessel ischemic changes are again noted throughout the white matter. No acute infarct or hemorrhage. Lateral ventricles and midline structures are unremarkable. No acute extra-axial fluid collections. No mass effect. Vascular: No hyperdense vessel or unexpected calcification. Skull: There are bilateral frontal scalp hematomas. No underlying fracture. Remainder of the calvarium is unremarkable. Sinuses/Orbits: No acute finding. Other: None. IMPRESSION: 1. Bilateral frontal scalp hematomas. Otherwise no acute intracranial process. 2. Evolution of the left MCA territory infarct seen on previous exam. Stable chronic small-vessel ischemic changes elsewhere within the white matter. Electronically Signed   By: Randa Ngo M.D.   On: 09/07/2020 22:49   CT Cervical Spine Wo  Contrast  Result Date: 09/07/2020 CLINICAL DATA:  Found down, scalp hematoma EXAM: CT CERVICAL SPINE WITHOUT CONTRAST TECHNIQUE: Multidetector CT imaging of the cervical spine was performed without intravenous contrast. Multiplanar CT image reconstructions were also generated. COMPARISON:  [place] 2420 FINDINGS: Alignment: Alignment is grossly anatomic. Skull base and vertebrae: No acute fracture. No primary bone lesion or focal pathologic process. Soft tissues and spinal canal: No prevertebral fluid or swelling. No visible canal hematoma. Stable large pannus at the C1-C2 interface with resulting central canal stenosis again noted. Disc levels: Stable extensive multilevel cervical spondylosis and facet hypertrophy unchanged. Left predominant neural foraminal encroachment throughout the cervical spine, with multilevel central canal stenosis greatest at C4-5 and C5-6. Upper chest: Airway is patent.  Lung apices are clear. Other: Reconstructed images demonstrate no additional findings. IMPRESSION: 1. No acute cervical spine fracture. 2. Stable extensive cervical spondylosis and facet hypertrophy. Electronically Signed   By: Randa Ngo M.D.   On: 09/07/2020 22:53   DG Chest Portable 1 View  Result Date: 09/07/2020 CLINICAL DATA:  Pulmonary crackles on examination EXAM: PORTABLE CHEST 1 VIEW COMPARISON:  07/16/2020 FINDINGS: There is a nodular density seen at the a left costophrenic angle which may represent callus formation adjacent to a subacute fracture of the left ninth rib posterolaterally. The lungs are otherwise clear. No pneumothorax or pleural effusion. Cardiac size is mildly enlarged, unchanged. Mild central pulmonary vascular congestion without overt pulmonary edema appears stable. IMPRESSION: Stable cardiomegaly and central pulmonary venous vascular congestion without overt pulmonary edema. Subacute fracture of the left ninth rib. Adjacent pulmonary nodule may represent callus related to the fracture.  A follow-up chest radiograph in 3 months would be helpful in documenting stability and excluding an alternative etiology of the underlying pulmonary nodule. Alternatively, this could be further assessed with dedicated CT imaging if indicated. Electronically Signed   By: Fidela Salisbury MD   On: 09/07/2020 22:13    EKG: Ordered but not yet performed.  Assessment/Plan Principal Problem:   Falls Active Problems:   HTN (hypertension), benign   CAD (coronary artery disease)   Atrial fibrillation, chronic (HCC)   HLD (hyperlipidemia)   History of CVA (cerebrovascular accident)  Falls History of CVA > Unclear if mechanical.  Has had recurrent falls. > CT Head read as evolution of prior MCA territory infarct, which I interpret as meaning expected changes not extension of infarct. > Not on Eliquis due to falls being known at the time of previous infarct.  Also not on statin due to nearly being at goal. - Reevaluation with PT/OT given recurrent falls - Continue with aspirin  AKI > Creatinine elevated to 1.62 from baseline of 0.9 > Appears euvolemic to mildly volume up on exam.  With rales but no lower extremity edema.  Will monitor response to Lasix to see if creatinine improves. - We will treat with oral Lasix 20 mg x 1 - Trend renal function and electrolytes  CHF > Last echo done at the time of her recent stroke showed EF 50% with inability to assess diastolic dysfunction and low normal RV function. > Mild CHF exacerbation with rales on exam, pulmonary venous congestion on chest x-ray, BNP elevated to 703 and AKI as above. > We will give low-dose of Lasix, she is not currently on Lasix outpatient (had previously been on 20 mg) - Monitor on telemetry - Lasix 20 mg po once - Strict I's/O, daily weights - Trend renal function and electrolytes  CAD - Continue home aspirin  History of A. Fib > EKG pending for today, was in A. fib with intermittent tachycardia (90s-100s) on exam. - Was  taken off diltiazem after stroke admission.  Not currently on anticoagulation beyond aspirin due to falls.  DVT prophylaxis: Lovenox  Code Status:   DNR  Family Communication:  Daughter updated by phone.  Disposition Plan:   Patient is from:  [facility]  Anticipated DC to:  [facility] home  Anticipated DC date:  1 to 2 days  Anticipated DC barriers: None  Consults called:  None  Admission status:  Observation, telemetry   Severity of Illness: The appropriate patient status for this patient is OBSERVATION. Observation status is judged to be reasonable and necessary in order to provide the required intensity of service to ensure the patient's safety. The patient's presenting symptoms, physical exam findings, and initial radiographic and laboratory data in the context of their medical condition is felt to place them at decreased risk for further clinical deterioration. Furthermore, it is anticipated that the patient will be medically stable for discharge from the hospital within 2 midnights of admission. The following factors support the patient status of observation.   " The patient's presenting symptoms include fall. " The physical exam findings include rales, bruises on forehead and face. " The initial radiographic and laboratory data are CMP with creatinine elevated to 1.62 from baseline of 0.9.  Glucose 144, BUN 25.  Calcium 8.8 which corrects considering albumin of 2.9.  CBC with mild leukocytosis to 10.9.  BNP elevated to 703.  Troponin mildly elevated to 38 but flat on repeat at 34.  Urinalysis pending.  Chest x-ray showed stable cardiomegaly with pulmonary venous congestion and subacute ninth rib fracture.  CT head showed bilateral hematoma and evolution of previously demonstrated MCA infarct.  CT C-spine was without acute abnormality.Marcelyn Bruins MD [facility]  How to contact the [facility] Attending or Consulting provider [place] or covering provider during  after hours [place], for this patient?   Check the care team in [facility] and look for a) attending/consulting [facility] provider listed and b) the [facility] team listed Log into [email or web address] and use [place]'s universal password to access. If you do not  have the password, please contact the hospital operator. Locate the [facility] provider you are looking for under [facility] and page to a number that you can be directly reached. If you still have difficulty reaching the provider, please page the [facility] (Director on Call) for the Hospitalists listed on amion for assistance.  09/08/2020, 12:27 AM

## 2020-09-28 ENCOUNTER — Emergency Department
Admission: EM | Admit: 2020-09-28 | Discharge: 2020-09-29 | Disposition: A | Discharge status: Home / Self Care | Payer: Medicare Other | Source: Home / Self Care | Attending: Emergency Medicine | Admitting: Emergency Medicine

## 2020-09-28 ENCOUNTER — Other Ambulatory Visit: Payer: Self-pay

## 2020-09-28 DIAGNOSIS — I2510 Atherosclerotic heart disease of native coronary artery without angina pectoris: Secondary | ICD-10-CM | POA: Insufficient documentation

## 2020-09-28 DIAGNOSIS — R4182 Altered mental status, unspecified: Secondary | ICD-10-CM | POA: Insufficient documentation

## 2020-09-28 DIAGNOSIS — Z853 Personal history of malignant neoplasm of breast: Secondary | ICD-10-CM | POA: Insufficient documentation

## 2020-09-28 DIAGNOSIS — Z79899 Other long term (current) drug therapy: Secondary | ICD-10-CM | POA: Insufficient documentation

## 2020-09-28 DIAGNOSIS — I110 Hypertensive heart disease with heart failure: Secondary | ICD-10-CM | POA: Insufficient documentation

## 2020-09-28 DIAGNOSIS — I509 Heart failure, unspecified: Secondary | ICD-10-CM | POA: Insufficient documentation

## 2020-09-28 DIAGNOSIS — N390 Urinary tract infection, site not specified: Secondary | ICD-10-CM | POA: Insufficient documentation

## 2020-09-28 DIAGNOSIS — I63511 Cerebral infarction due to unspecified occlusion or stenosis of right middle cerebral artery: Secondary | ICD-10-CM | POA: Diagnosis not present

## 2020-09-28 DIAGNOSIS — Z7982 Long term (current) use of aspirin: Secondary | ICD-10-CM | POA: Insufficient documentation

## 2020-09-28 LAB — BASIC METABOLIC PANEL
Anion gap: 11 (ref 5–15)
BUN: 27 mg/dL — ABNORMAL HIGH (ref 8–23)
CO2: 29 mmol/L (ref 22–32)
Calcium: 9 mg/dL (ref 8.9–10.3)
Chloride: 92 mmol/L — ABNORMAL LOW (ref 98–111)
Creatinine, Ser: 1.49 mg/dL — ABNORMAL HIGH (ref 0.44–1.00)
GFR, Estimated: 32 mL/min — ABNORMAL LOW (ref >60)
Glucose, Bld: 95 mg/dL (ref 70–99)
Potassium: 3.8 mmol/L (ref 3.5–5.1)
Sodium: 132 mmol/L — ABNORMAL LOW (ref 135–145)

## 2020-09-28 LAB — CBC WITH DIFFERENTIAL/PLATELET
Abs Immature Granulocytes: 0.05 10*3/uL (ref 0.00–0.07)
Basophils Absolute: 0.1 10*3/uL (ref 0.0–0.1)
Basophils Relative: 1 %
Eosinophils Absolute: 0.1 10*3/uL (ref 0.0–0.5)
Eosinophils Relative: 1 %
HCT: 39.3 % (ref 36.0–46.0)
Hemoglobin: 12.9 g/dL (ref 12.0–15.0)
Immature Granulocytes: 1 %
Lymphocytes Relative: 14 %
Lymphs Abs: 1.5 10*3/uL (ref 0.7–4.0)
MCH: 28.4 pg (ref 26.0–34.0)
MCHC: 32.8 g/dL (ref 30.0–36.0)
MCV: 86.6 fL (ref 80.0–100.0)
Monocytes Absolute: 0.8 10*3/uL (ref 0.1–1.0)
Monocytes Relative: 7 %
Neutro Abs: 7.9 10*3/uL — ABNORMAL HIGH (ref 1.7–7.7)
Neutrophils Relative %: 76 %
Platelets: 237 10*3/uL (ref 150–400)
RBC: 4.54 MIL/uL (ref 3.87–5.11)
RDW: 15.3 % (ref 11.5–15.5)
WBC: 10.4 10*3/uL (ref 4.0–10.5)
nRBC: 0 % (ref 0.0–0.2)

## 2020-09-28 LAB — URINALYSIS, COMPLETE (UACMP) WITH MICROSCOPIC
Bilirubin Urine: NEGATIVE
Glucose, UA: NEGATIVE mg/dL
Hgb urine dipstick: NEGATIVE
Ketones, ur: NEGATIVE mg/dL
Nitrite: NEGATIVE
Protein, ur: 100 mg/dL — AB
Specific Gravity, Urine: 1.015 (ref 1.005–1.030)
Squamous Epithelial / HPF: NONE SEEN (ref 0–5)
WBC, UA: >50 WBC/hpf — ABNORMAL HIGH (ref 0–5)
pH: 6 (ref 5.0–8.0)

## 2020-09-28 MED ORDER — SODIUM CHLORIDE 0.9 % IV SOLN
1.0000 g | Freq: Once | INTRAVENOUS | Status: AC
  Administered 2020-09-28: 1 g via INTRAVENOUS
  Filled 2020-09-28: qty 10

## 2020-09-28 NOTE — ED Triage Notes (Signed)
Pt was sent to the ED by [place] facility for an assessment for UTI - They stated she has been "altered" and "acting crazy all day.

## 2020-09-28 NOTE — ED Notes (Signed)
Pt in & out cathed for urine specimen and provided a clean brief

## 2020-09-29 MED ORDER — CEPHALEXIN 500 MG PO CAPS: 500.0000 mg | ORAL_CAPSULE | Freq: Four times a day (QID) | ORAL | 0 refills | Status: DC

## 2020-09-29 NOTE — Discharge Instructions (Addendum)
Please seek medical attention for any high fevers, chest pain, shortness of breath, change in behavior, persistent vomiting, bloody stool or any other new or concerning symptoms.  

## 2020-09-29 NOTE — ED Notes (Signed)
Patients family [place]) notified.

## 2020-09-29 NOTE — ED Notes (Signed)
Attempted 7 times to call the [facility] to give report - no answer from the facility. Patients family aware of discharge plan and verbalized understanding.

## 2020-09-29 NOTE — ED Provider Notes (Signed)
[facility] Emergency Department Provider Note   ____________________________________________   I have reviewed the triage vital signs and the nursing notes.   HISTORY  Chief Complaint Altered Mental Status   History limited by: Altered Mental Status   HPI Sheryl Suarez is a 87 y.o. female who presents to the emergency department today from living facility because of concern for altered mental status. The patient is unable to give any history as to why she is here, however she denies any pain.    Records reviewed. Per medical record review patient has a history of CHF, CAD.   Past Medical History:  Diagnosis Date   Acute respiratory failure with hypoxia (HCC)    Arrhythmia    atrial fibrillation   Breast cancer (HCC)    remission   CHF (congestive heart failure) ([place])    Coronary artery disease    Hypertension    Sepsis due to undetermined organism ([place]) 07/16/2020   Stroke [facility])     Patient Active Problem List   Diagnosis Date Noted   Falls 09/08/2020   History of CVA (cerebrovascular accident) 09/08/2020   Goals of care, counseling/discussion 07/16/2020   DNR (do not resuscitate) 07/16/2020   Pressure ulcer 07/12/2020   Acute ischemic stroke ([place]) 07/11/2020   CHF exacerbation (HCC)    Frequent falls    Bradycardia    HLD (hyperlipidemia) 04/23/2019   UTI (urinary tract infection) 04/23/2019   Closed fracture of right olecranon process    Fall    Generalized weakness    Closed fracture dislocation of right elbow 01/15/2019   Closed rib fracture 01/15/2019   HTN (hypertension), benign 01/15/2019   CAD (coronary artery disease) 01/15/2019   Atrial fibrillation, chronic ([place]) 01/15/2019   Rhabdomyolysis 01/15/2019   GI bleed 08/18/2015    Past Surgical History:  Procedure Laterality Date   ABDOMINAL HYSTERECTOMY     APPENDECTOMY     BREAST IMPLANT EXCHANGE     CHOLECYSTECTOMY     ESOPHAGOGASTRODUODENOSCOPY (EGD) WITH PROPOFOL  N/A 08/20/2015   Procedure: ESOPHAGOGASTRODUODENOSCOPY (EGD) WITH PROPOFOL;  Surgeon: Lollie Sails, MD;  Location: [facility] ENDOSCOPY;  Service: Endoscopy;  Laterality: N/A;   MASTECTOMY Bilateral    ORIF ELBOW FRACTURE Right 01/16/2019   Procedure: OPEN REDUCTION INTERNAL FIXATION (ORIF) ELBOW/OLECRANON FRACTURE;  Surgeon: Earnestine Leys, MD;  Location: [facility] ORS;  Service: Orthopedics;  Laterality: Right;    Prior to Admission medications   Medication Sig Start Date End Date Taking? Authorizing Provider  cephALEXin (KEFLEX) 500 MG capsule Take 1 capsule (500 mg total) by mouth 4 (four) times daily for 10 days. 09/29/20 10/09/20 Yes Nance Pear, MD  acetaminophen (TYLENOL) 325 MG tablet Take 2 tablets (650 mg total) by mouth every 4 (four) hours as needed for mild pain (or temp > 37.5 C (99.5 F)). 07/23/20   Bailey-Modzik, Delila A, NP  aspirin EC 81 MG EC tablet Take 1 tablet (81 mg total) by mouth daily. Swallow whole. 07/24/20   Bailey-Modzik, Delila A, NP  diltiazem (CARDIZEM CD) 120 MG 24 hr capsule Take 120 mg by mouth daily. 08/21/20   [provider]  Emollient (CETAPHIL) cream Apply 1 application topically daily. Apply to bilateral legs    [provider]  furosemide (LASIX) 20 MG tablet Take 20 mg by mouth daily. 08/30/20   [provider]  lidocaine (LIDODERM) 5 % Place 1 patch onto the skin daily. Apply to lower back.    [provider]  lisinopril (  ZESTRIL) 5 MG tablet Take 5 mg by mouth daily. 08/16/20   [provider]  mirtazapine (REMERON) 15 MG tablet Take 15 mg by mouth at bedtime.    [provider]  ondansetron (ZOFRAN-ODT) 4 MG disintegrating tablet Take 1 tablet (4 mg total) by mouth every 6 (six) hours as needed for nausea. 07/23/20   Bailey-Modzik, Delila A, NP  polyvinyl alcohol (LIQUIFILM TEARS) 1.4 % ophthalmic solution Place 1 drop into both eyes 4 (four) times daily as needed for dry eyes. 07/23/20   Bailey-Modzik, Delila A,  NP  senna-docusate (SENOKOT-S) 8.6-50 MG tablet Take 1 tablet by mouth at bedtime as needed for mild constipation. 07/23/20   Bailey-Modzik, Morene Crocker, NP    Allergies Patient has no known allergies.  Family History  Problem Relation Age of Onset   CAD Mother    CAD Father     Social History Social History   Tobacco Use   Smoking status: Never   Smokeless tobacco: Never  Vaping Use   Vaping Use: Never used  Substance Use Topics   Alcohol use: No   Drug use: Never    Review of Systems Unable to obtain reliable ROS.   ____________________________________________   PHYSICAL EXAM:  VITAL SIGNS: ED Triage Vitals  Enc Vitals Group     BP 09/28/20 2136 140/88     Pulse Rate 09/28/20 2136 88     Resp 09/28/20 2136 16     Temp 09/28/20 2136 98.5 F (36.9 C)     Temp Source 09/28/20 2136 Oral     SpO2 09/28/20 2136 98 %     Weight 09/28/20 2134 113 lb 8.6 oz (51.5 kg)     Height 09/28/20 2134 '5\' 3"'$  (1.6 m)     Head Circumference --      Peak Flow --      Pain Score 09/28/20 2134 0   Constitutional: Alert and oriented.  Eyes: Conjunctivae are normal.  ENT      Head: Normocephalic and atraumatic.      Nose: No congestion/rhinnorhea.      Mouth/Throat: Mucous membranes are moist.      Neck: No stridor. Hematological/Lymphatic/Immunilogical: No cervical lymphadenopathy. Cardiovascular: Normal rate, regular rhythm.  No murmurs, rubs, or gallops.  Respiratory: Normal respiratory effort without tachypnea nor retractions. Breath sounds are clear and equal bilaterally. No wheezes/rales/rhonchi. Gastrointestinal: Soft and non tender. No rebound. No guarding.  Genitourinary: Deferred Musculoskeletal: Normal range of motion in all extremities. No lower extremity edema. Neurologic:  Normal speech. Not oriented.  Skin:  Skin is warm, dry and intact. No rash noted. ____________________________________________    LABS (pertinent positives/negatives)  UA cloudy, moderate  leukocytes, >50 WBC BMP na 132, k 3.8, glu 95, cr 1.49 CBC wbc 10.4, hgb 12.9, plt 237  ____________________________________________   EKG  None  ____________________________________________    RADIOLOGY  None  ____________________________________________   PROCEDURES  Procedures  ____________________________________________   INITIAL IMPRESSION / ASSESSMENT AND PLAN / ED COURSE  Pertinent labs & imaging results that were available during my care of the patient were reviewed by me and considered in my medical decision making (see chart for details).   Patient presented to the emergency department today because of concern for altered mental status. Patient is unable to supply any significant history. The patient's work up is consistent with UTI. The patient was given dose of IV abx here in the emergency department. Will discharge with further antibiotics.   ___________________________________________   FINAL  CLINICAL IMPRESSION(S) / ED DIAGNOSES  Final diagnoses:  Altered mental status, unspecified altered mental status type  Lower urinary tract infectious disease     Note: This dictation was prepared with Dragon dictation. Any transcriptional errors that result from this process are unintentional     Nance Pear, MD 09/29/20 0009

## 2020-09-30 ENCOUNTER — Inpatient Hospital Stay: Payer: Medicare Other | Admitting: Adult Health

## 2020-10-01 ENCOUNTER — Observation Stay: Payer: Medicare Other

## 2020-10-01 ENCOUNTER — Inpatient Hospital Stay
Admission: EM | Admit: 2020-10-01 | Discharge: 2020-10-06 | DRG: 065 | Disposition: A | Discharge status: Skilled Nursing Facility | Payer: Medicare Other | Source: Skilled Nursing Facility | Attending: Internal Medicine | Admitting: Internal Medicine

## 2020-10-01 ENCOUNTER — Other Ambulatory Visit: Payer: Self-pay

## 2020-10-01 ENCOUNTER — Emergency Department: Payer: Medicare Other

## 2020-10-01 DIAGNOSIS — L89159 Pressure ulcer of sacral region, unspecified stage: Secondary | ICD-10-CM

## 2020-10-01 DIAGNOSIS — I63511 Cerebral infarction due to unspecified occlusion or stenosis of right middle cerebral artery: Principal | ICD-10-CM | POA: Diagnosis present

## 2020-10-01 DIAGNOSIS — R4182 Altered mental status, unspecified: Secondary | ICD-10-CM | POA: Diagnosis not present

## 2020-10-01 DIAGNOSIS — I25810 Atherosclerosis of coronary artery bypass graft(s) without angina pectoris: Secondary | ICD-10-CM | POA: Diagnosis not present

## 2020-10-01 DIAGNOSIS — Z8249 Family history of ischemic heart disease and other diseases of the circulatory system: Secondary | ICD-10-CM

## 2020-10-01 DIAGNOSIS — N179 Acute kidney failure, unspecified: Secondary | ICD-10-CM

## 2020-10-01 DIAGNOSIS — Z8673 Personal history of transient ischemic attack (TIA), and cerebral infarction without residual deficits: Secondary | ICD-10-CM

## 2020-10-01 DIAGNOSIS — R296 Repeated falls: Secondary | ICD-10-CM | POA: Diagnosis present

## 2020-10-01 DIAGNOSIS — I10 Essential (primary) hypertension: Secondary | ICD-10-CM | POA: Diagnosis present

## 2020-10-01 DIAGNOSIS — I5032 Chronic diastolic (congestive) heart failure: Secondary | ICD-10-CM

## 2020-10-01 DIAGNOSIS — Z79899 Other long term (current) drug therapy: Secondary | ICD-10-CM

## 2020-10-01 DIAGNOSIS — Z853 Personal history of malignant neoplasm of breast: Secondary | ICD-10-CM

## 2020-10-01 DIAGNOSIS — Z7982 Long term (current) use of aspirin: Secondary | ICD-10-CM

## 2020-10-01 DIAGNOSIS — I2510 Atherosclerotic heart disease of native coronary artery without angina pectoris: Secondary | ICD-10-CM | POA: Diagnosis present

## 2020-10-01 DIAGNOSIS — Z20822 Contact with and (suspected) exposure to covid-19: Secondary | ICD-10-CM | POA: Diagnosis present

## 2020-10-01 DIAGNOSIS — N390 Urinary tract infection, site not specified: Secondary | ICD-10-CM | POA: Diagnosis not present

## 2020-10-01 DIAGNOSIS — B9620 Unspecified Escherichia coli [E. coli] as the cause of diseases classified elsewhere: Secondary | ICD-10-CM | POA: Diagnosis present

## 2020-10-01 DIAGNOSIS — I482 Chronic atrial fibrillation, unspecified: Secondary | ICD-10-CM | POA: Diagnosis present

## 2020-10-01 DIAGNOSIS — Z9882 Breast implant status: Secondary | ICD-10-CM

## 2020-10-01 DIAGNOSIS — Z9013 Acquired absence of bilateral breasts and nipples: Secondary | ICD-10-CM

## 2020-10-01 DIAGNOSIS — Z66 Do not resuscitate: Secondary | ICD-10-CM | POA: Diagnosis present

## 2020-10-01 DIAGNOSIS — R54 Age-related physical debility: Secondary | ICD-10-CM | POA: Diagnosis present

## 2020-10-01 DIAGNOSIS — E871 Hypo-osmolality and hyponatremia: Secondary | ICD-10-CM

## 2020-10-01 DIAGNOSIS — I110 Hypertensive heart disease with heart failure: Secondary | ICD-10-CM | POA: Diagnosis present

## 2020-10-01 DIAGNOSIS — L89151 Pressure ulcer of sacral region, stage 1: Secondary | ICD-10-CM | POA: Diagnosis present

## 2020-10-01 LAB — URINE DRUG SCREEN, QUALITATIVE (ARMC ONLY)
Amphetamines, Ur Screen: NOT DETECTED
Barbiturates, Ur Screen: NOT DETECTED
Benzodiazepine, Ur Scrn: NOT DETECTED
Cannabinoid 50 Ng, Ur ~~LOC~~: NOT DETECTED
Cocaine Metabolite,Ur ~~LOC~~: NOT DETECTED
MDMA (Ecstasy)Ur Screen: NOT DETECTED
Methadone Scn, Ur: NOT DETECTED
Opiate, Ur Screen: NOT DETECTED
Phencyclidine (PCP) Ur S: NOT DETECTED
Tricyclic, Ur Screen: NOT DETECTED

## 2020-10-01 LAB — URINE CULTURE: Culture: >=100000 — AB

## 2020-10-01 LAB — URINALYSIS, COMPLETE (UACMP) WITH MICROSCOPIC
Bilirubin Urine: NEGATIVE
Glucose, UA: NEGATIVE mg/dL
Hgb urine dipstick: NEGATIVE
Ketones, ur: NEGATIVE mg/dL
Nitrite: NEGATIVE
Protein, ur: 100 mg/dL — AB
Specific Gravity, Urine: 1.014 (ref 1.005–1.030)
Squamous Epithelial / HPF: NONE SEEN (ref 0–5)
pH: 5 (ref 5.0–8.0)

## 2020-10-01 LAB — CBC WITH DIFFERENTIAL/PLATELET
Abs Immature Granulocytes: 0.07 10*3/uL (ref 0.00–0.07)
Basophils Absolute: 0.1 10*3/uL (ref 0.0–0.1)
Basophils Relative: 0 %
Eosinophils Absolute: 0 10*3/uL (ref 0.0–0.5)
Eosinophils Relative: 0 %
HCT: 38.9 % (ref 36.0–46.0)
Hemoglobin: 13.2 g/dL (ref 12.0–15.0)
Immature Granulocytes: 0 %
Lymphocytes Relative: 8 %
Lymphs Abs: 1.4 10*3/uL (ref 0.7–4.0)
MCH: 29.3 pg (ref 26.0–34.0)
MCHC: 33.9 g/dL (ref 30.0–36.0)
MCV: 86.4 fL (ref 80.0–100.0)
Monocytes Absolute: 1.3 10*3/uL — ABNORMAL HIGH (ref 0.1–1.0)
Monocytes Relative: 7 %
Neutro Abs: 14.4 10*3/uL — ABNORMAL HIGH (ref 1.7–7.7)
Neutrophils Relative %: 85 %
Platelets: 258 10*3/uL (ref 150–400)
RBC: 4.5 MIL/uL (ref 3.87–5.11)
RDW: 15.5 % (ref 11.5–15.5)
WBC: 17.2 10*3/uL — ABNORMAL HIGH (ref 4.0–10.5)
nRBC: 0 % (ref 0.0–0.2)

## 2020-10-01 LAB — TROPONIN I (HIGH SENSITIVITY)
Troponin I (High Sensitivity): 35 ng/L — ABNORMAL HIGH (ref <18)
Troponin I (High Sensitivity): 35 ng/L — ABNORMAL HIGH (ref <18)

## 2020-10-01 LAB — COMPREHENSIVE METABOLIC PANEL
ALT: 10 U/L (ref 0–44)
AST: 15 U/L (ref 15–41)
Albumin: 3.3 g/dL — ABNORMAL LOW (ref 3.5–5.0)
Alkaline Phosphatase: 99 U/L (ref 38–126)
Anion gap: 10 (ref 5–15)
BUN: 28 mg/dL — ABNORMAL HIGH (ref 8–23)
CO2: 29 mmol/L (ref 22–32)
Calcium: 9.5 mg/dL (ref 8.9–10.3)
Chloride: 90 mmol/L — ABNORMAL LOW (ref 98–111)
Creatinine, Ser: 1.55 mg/dL — ABNORMAL HIGH (ref 0.44–1.00)
GFR, Estimated: 31 mL/min — ABNORMAL LOW (ref >60)
Glucose, Bld: 108 mg/dL — ABNORMAL HIGH (ref 70–99)
Potassium: 4 mmol/L (ref 3.5–5.1)
Sodium: 129 mmol/L — ABNORMAL LOW (ref 135–145)
Total Bilirubin: 0.8 mg/dL (ref 0.3–1.2)
Total Protein: 7.5 g/dL (ref 6.5–8.1)

## 2020-10-01 LAB — BRAIN NATRIURETIC PEPTIDE: B Natriuretic Peptide: 626 pg/mL — ABNORMAL HIGH (ref 0.0–100.0)

## 2020-10-01 IMAGING — CT CT HEAD W/O CM
4 series · 17 of 47 positions shown, 19 images · non-contrast
Comparison: CT brain [DATE]

CLINICAL DATA: Mental status change

EXAM:
CT HEAD WITHOUT CONTRAST
TECHNIQUE: Contiguous axial images were obtained from the base of the skull
through the vertex without intravenous contrast.

[Series 2: head bone · axial · 0.41mm/px · z∈[-138,-86]mm · 4 of 75 slices shown]
[im 8/75  bone]
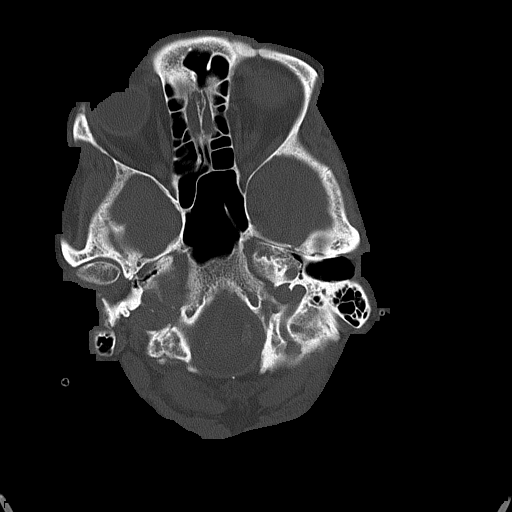
[im 15/75  bone]
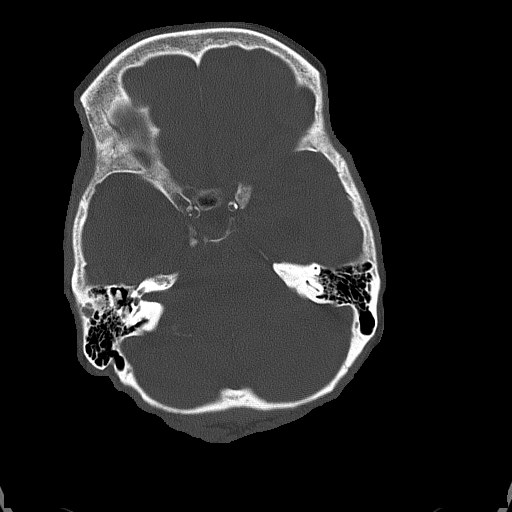
[im 23/75  bone]
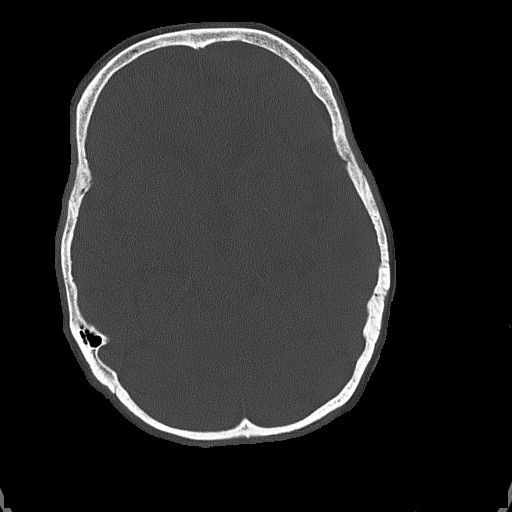
[im 34/75  bone]
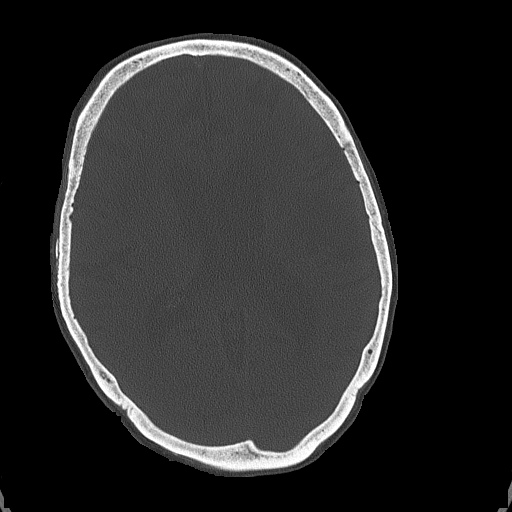

[Series 3: head wo · axial · 0.41mm/px · z∈[-137,-27]mm · 7 of 30 slices shown, 9 images]
[im 4/30  brain]
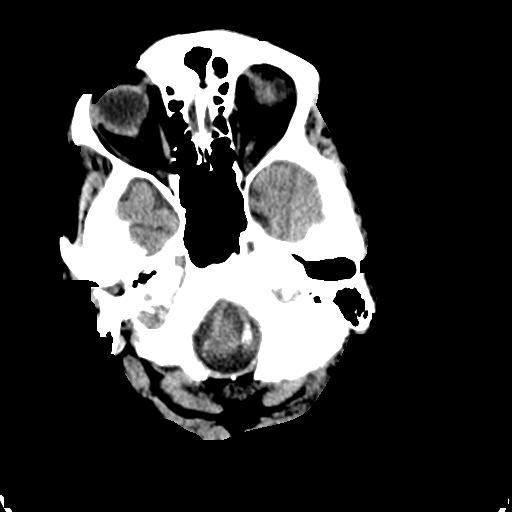
[im 4/30  bone]
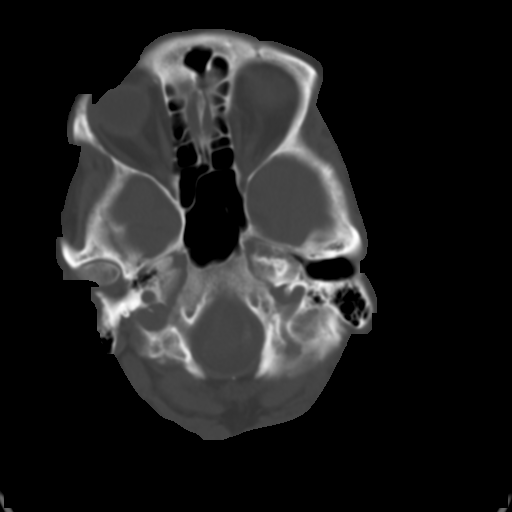
[im 8/30  brain]
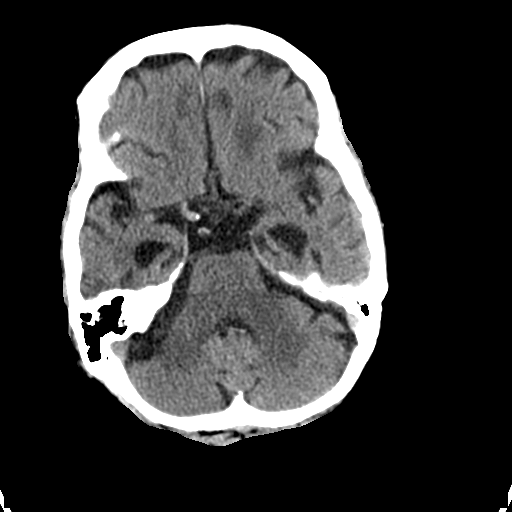
[im 11/30  brain]
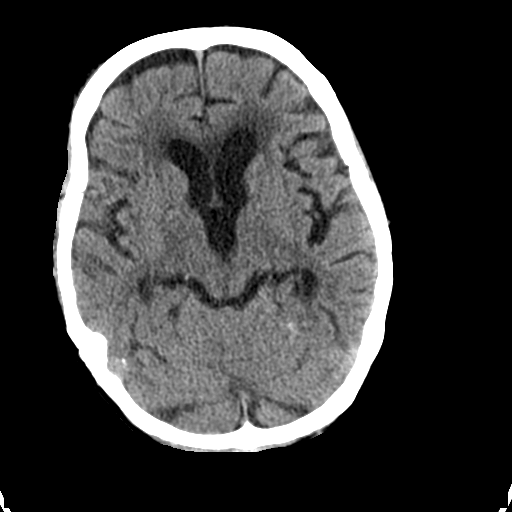
[im 15/30  brain]
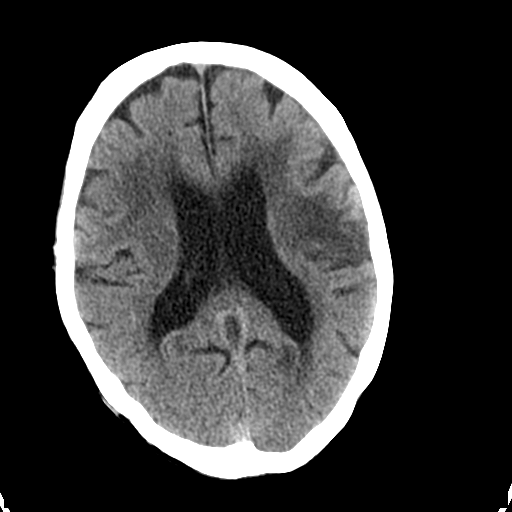
[im 19/30  brain]
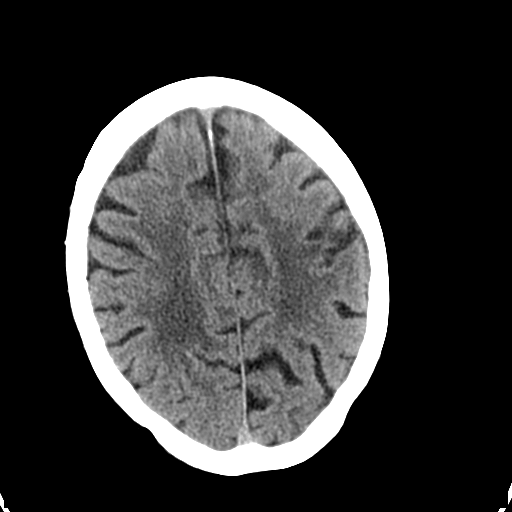
[im 19/30  bone]
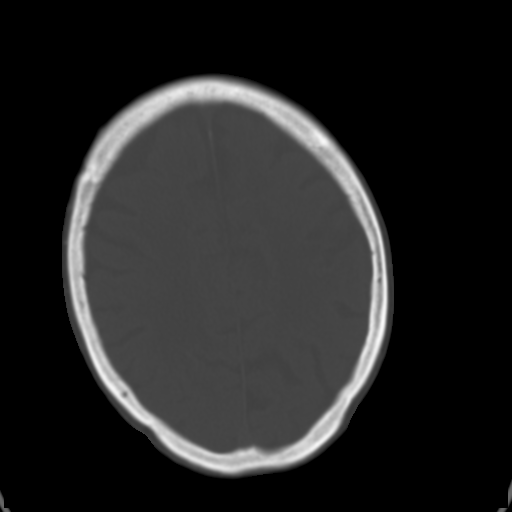
[im 22/30  brain]
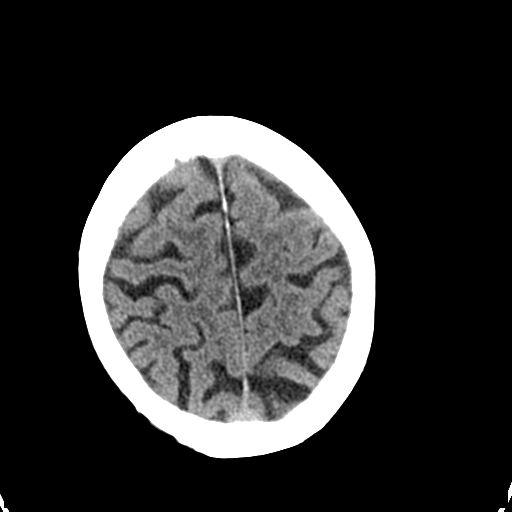
[im 26/30  brain]
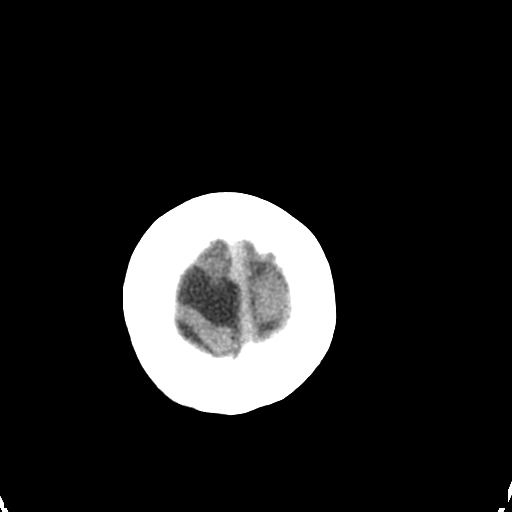

[Series 4: coronal soft tissue · coronal · 0.31mm/px · 3 of 65 slices shown]
[im 22/65  brain]
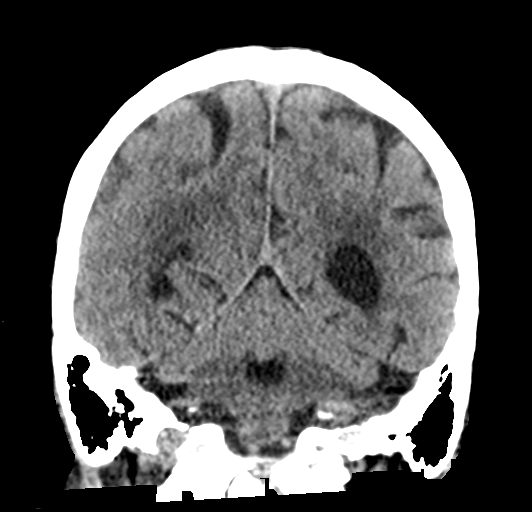
[im 29/65  brain]
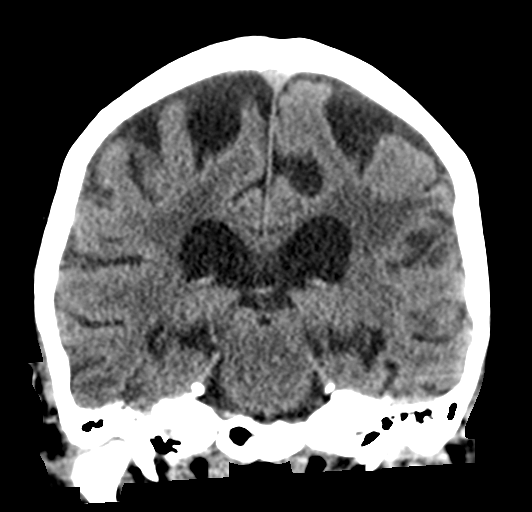
[im 36/65  brain]
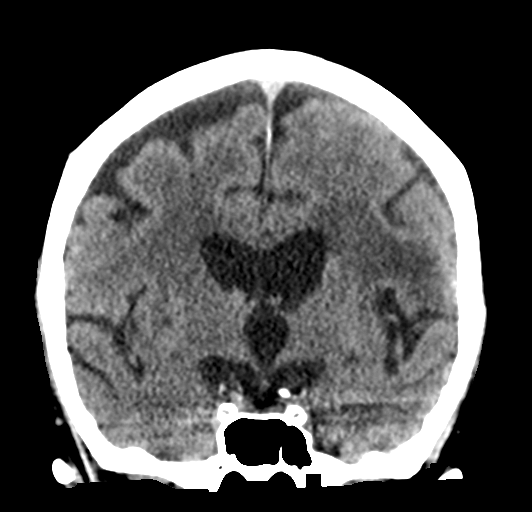

[Series 5: sagittal soft tissue · sagittal · 0.31mm/px · 3 of 53 slices shown]
[im 18/53  brain]
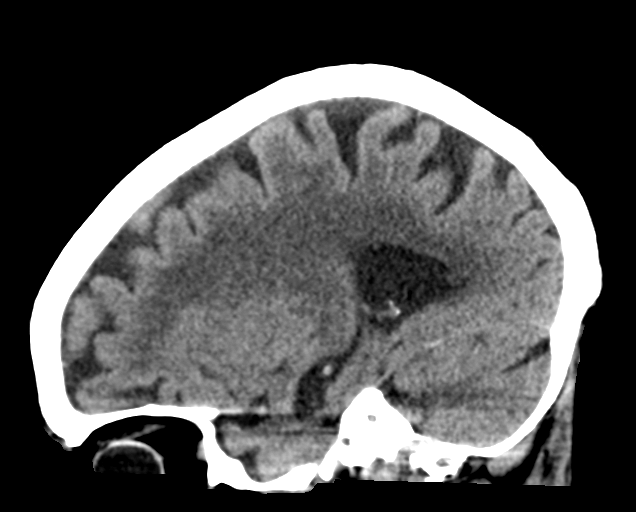
[im 27/53  brain]
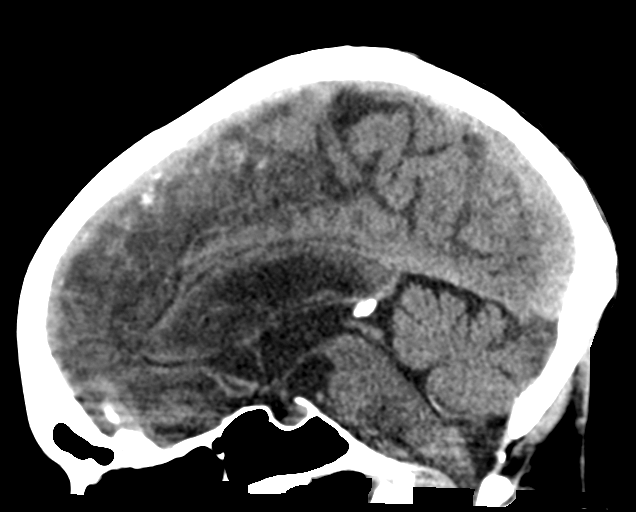
[im 35/53  brain]
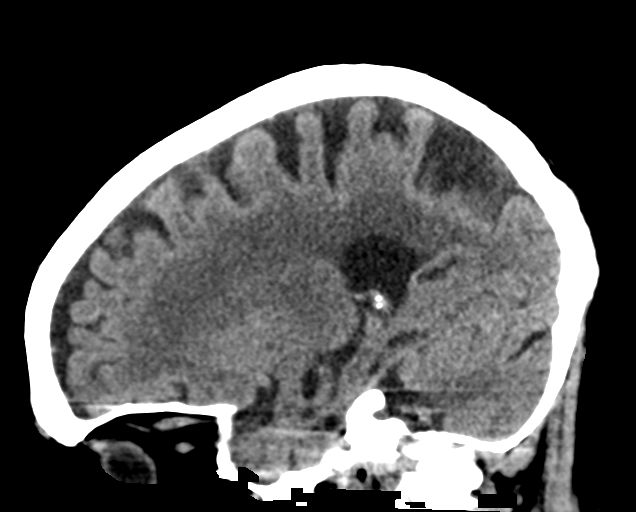

[17 of 47 positions shown; findings below may reference images not displayed]

FINDINGS: Brain: No acute territorial infarction, hemorrhage or intracranial
mass. Atrophy and chronic small vessel ischemic changes of the white
matter. Chronic left MCA infarct. Stable ventricle size.

Vascular: No hyperdense vessels.  Carotid vascular calcification

Skull: Normal. Negative for fracture or focal lesion.

Sinuses/Orbits: No acute finding.

Other: None
IMPRESSION: 1. No CT evidence for acute intracranial abnormality.
2. Chronic left MCA infarct. Atrophy and chronic small vessel
ischemic changes of the white matter.

## 2020-10-01 MED ORDER — SODIUM CHLORIDE 0.9 % IV SOLN
1.0000 g | Freq: Once | INTRAVENOUS | Status: AC
  Administered 2020-10-01: 1 g via INTRAVENOUS
  Filled 2020-10-01: qty 10

## 2020-10-01 MED ORDER — ASPIRIN EC 81 MG PO TBEC
81.0000 mg | DELAYED_RELEASE_TABLET | Freq: Every day | ORAL | Status: DC
  Administered 2020-10-02 – 2020-10-06 (×5): 81 mg via ORAL
  Filled 2020-10-01 (×5): qty 1

## 2020-10-01 MED ORDER — ZINC OXIDE 20 % EX OINT
1.0000 "application " | TOPICAL_OINTMENT | Freq: Three times a day (TID) | CUTANEOUS | Status: DC
  Administered 2020-10-02 – 2020-10-06 (×14): 1 via TOPICAL
  Filled 2020-10-01: qty 30

## 2020-10-01 MED ORDER — ENOXAPARIN SODIUM 30 MG/0.3ML IJ SOSY
30.0000 mg | PREFILLED_SYRINGE | INTRAMUSCULAR | Status: DC
  Administered 2020-10-01 – 2020-10-05 (×5): 30 mg via SUBCUTANEOUS
  Filled 2020-10-01 (×5): qty 0.3

## 2020-10-01 MED ORDER — SODIUM CHLORIDE 0.9 % IV SOLN
1.0000 g | INTRAVENOUS | Status: DC
  Filled 2020-10-01: qty 10

## 2020-10-01 MED ORDER — DILTIAZEM HCL ER COATED BEADS 120 MG PO CP24
120.0000 mg | ORAL_CAPSULE | Freq: Every day | ORAL | Status: DC
  Administered 2020-10-02 – 2020-10-06 (×5): 120 mg via ORAL
  Filled 2020-10-01 (×5): qty 1

## 2020-10-01 MED ORDER — LIDOCAINE 5 % EX PTCH
1.0000 | MEDICATED_PATCH | Freq: Every day | CUTANEOUS | Status: DC
  Administered 2020-10-03 – 2020-10-06 (×2): 1 via TRANSDERMAL
  Filled 2020-10-01 (×5): qty 1

## 2020-10-01 MED ORDER — LACTATED RINGERS IV SOLN: INTRAVENOUS | Status: AC

## 2020-10-01 MED ORDER — ACETAMINOPHEN 325 MG PO TABS: 650.0000 mg | ORAL_TABLET | ORAL | Status: DC | PRN

## 2020-10-01 NOTE — ED Notes (Signed)
Patient transported to floor at this time.

## 2020-10-01 NOTE — ED Triage Notes (Signed)
Pt arrives via [facility] from [facility] in [place]. Staff called EMS due to pt being found, "in the closet and spelling out words."

## 2020-10-01 NOTE — ED Notes (Signed)
Phlebotomy notified of lab draw

## 2020-10-01 NOTE — ED Notes (Signed)
Secure chat sent to admission RN.  SBAR updated

## 2020-10-01 NOTE — H&P (Signed)
History and Physical    AVY SIMONYAN [id number] DOB: 11-12-1923 DOA: 10/01/2020  PCP: Leonel Ramsay, MD  Patient coming from: [facility]  I have personally briefly reviewed patient's old medical records in [facility]  Chief Complaint: AMS  HPI: Sheryl Suarez is a 87 y.o. female with medical history significant for embolic CVA, chronic atrial fibrillation, chronic diastolic heart failure, CAD, history of GI bleed and hypertension who presents with concerns of altered mental status.  Daughter who is pt's HCPOA at bedside provides history.  Patient lives in [place] assisted living facility and was sent over today after she was noted to be in the closet yelling and spelling out words.  At baseline she is able to recognize self, family and year. She was recently evaluated in the ED on 8/8 for altered mental status as well and discharged with Keflex for UTI.  Culture resulted today with E. coli. Daughter reports that she has not been complaining of pain.  States patient has been declining since her stroke in May and mechanical fall in July 19th.  Has been having worsening of her memory and decreased mobility.  However the current word spelling is new for her.  ED Course: She was afebrile, mildly hypertensive systolic of [id number], WBC of 17, sodium of 129 from 132 few days ago, creatinine of 1.55 from prior of 0.98.  BG of 108. UA shows small leukocyte, negative nitrite.  Negative UDS. Negative CT head.    Hospitalist was then called for admission and recommended MRI brain be ordered.  Review of Systems: Unable to obtain given patient's altered mental status Past Medical History:  Diagnosis Date   Acute respiratory failure with hypoxia (HCC)    Arrhythmia    atrial fibrillation   Breast cancer (HCC)    remission   CHF (congestive heart failure) (HCC)    Coronary artery disease    Hypertension    Sepsis due to undetermined organism ([place]) 07/16/2020   Stroke [facility])      Past Surgical History:  Procedure Laterality Date   ABDOMINAL HYSTERECTOMY     APPENDECTOMY     BREAST IMPLANT EXCHANGE     CHOLECYSTECTOMY     ESOPHAGOGASTRODUODENOSCOPY (EGD) WITH PROPOFOL N/A 08/20/2015   Procedure: ESOPHAGOGASTRODUODENOSCOPY (EGD) WITH PROPOFOL;  Surgeon: Lollie Sails, MD;  Location: [facility] ENDOSCOPY;  Service: Endoscopy;  Laterality: N/A;   MASTECTOMY Bilateral    ORIF ELBOW FRACTURE Right 01/16/2019   Procedure: OPEN REDUCTION INTERNAL FIXATION (ORIF) ELBOW/OLECRANON FRACTURE;  Surgeon: Earnestine Leys, MD;  Location: [facility] ORS;  Service: Orthopedics;  Laterality: Right;     reports that she has never smoked. She has never used smokeless tobacco. She reports that she does not drink alcohol and does not use drugs. Social History  No Known Allergies  Family History  Problem Relation Age of Onset   CAD Mother    CAD Father      Prior to Admission medications   Medication Sig Start Date End Date Taking? Authorizing Provider  acetaminophen (TYLENOL) 325 MG tablet Take 2 tablets (650 mg total) by mouth every 4 (four) hours as needed for mild pain (or temp > 37.5 C (99.5 F)). 07/23/20   Bailey-Modzik, Delila A, NP  aspirin EC 81 MG EC tablet Take 1 tablet (81 mg total) by mouth daily. Swallow whole. 07/24/20   Bailey-Modzik, Delila A, NP  cephALEXin (KEFLEX) 500 MG capsule Take 1 capsule (500 mg total) by mouth 4 (four) times daily for  10 days. 09/29/20 10/09/20  Nance Pear, MD  diltiazem (CARDIZEM CD) 120 MG 24 hr capsule Take 120 mg by mouth daily. 08/21/20   [provider]  Emollient (CETAPHIL) cream Apply 1 application topically daily. Apply to bilateral legs    [provider]  furosemide (LASIX) 20 MG tablet Take 20 mg by mouth daily. 08/30/20   [provider]  lidocaine (LIDODERM) 5 % Place 1 patch onto the skin daily. Apply to lower back.    [provider]  lisinopril (ZESTRIL) 5 MG tablet Take 5 mg by mouth daily.  08/16/20   [provider]  mirtazapine (REMERON) 15 MG tablet Take 15 mg by mouth at bedtime.    [provider]  ondansetron (ZOFRAN-ODT) 4 MG disintegrating tablet Take 1 tablet (4 mg total) by mouth every 6 (six) hours as needed for nausea. 07/23/20   Bailey-Modzik, Delila A, NP  polyvinyl alcohol (LIQUIFILM TEARS) 1.4 % ophthalmic solution Place 1 drop into both eyes 4 (four) times daily as needed for dry eyes. 07/23/20   Bailey-Modzik, Delila A, NP  senna-docusate (SENOKOT-S) 8.6-50 MG tablet Take 1 tablet by mouth at bedtime as needed for mild constipation. 07/23/20   Hetty Blend, NP    Physical Exam: Vitals:   10/01/20 1709 10/01/20 1712 10/01/20 1900  BP: (!) 168/73  (!) 161/67  Pulse: (!) 105  93  Resp: 17  18  Temp: 97.9 F (36.6 C)    TempSrc: Oral    SpO2: 96%  94%  Weight:  48.8 kg   Height:  '5\' 3"'$  (1.6 m)     Constitutional: NAD, calm, comfortable, thin neurology female lying flat in bed Vitals:   10/01/20 1709 10/01/20 1712 10/01/20 1900  BP: (!) 168/73  (!) 161/67  Pulse: (!) 105  93  Resp: 17  18  Temp: 97.9 F (36.6 C)    TempSrc: Oral    SpO2: 96%  94%  Weight:  48.8 kg   Height:  '5\' 3"'$  (1.6 m)    Eyes: PERRL, lids and conjunctivae normal ENMT: Mucous membranes are moist.  Neck: normal, supple Respiratory: clear to auscultation bilaterally, no wheezing, no crackles. Normal respiratory effort. No accessory muscle use.  Cardiovascular: Regular rate and rhythm, no murmurs / rubs / gallops. No extremity edema. 2+ pedal pulses. Abdomen: no tenderness, no masses palpated. Bowel sounds positive.  Buttocks:erythema on upper gluteal and sacral area. one deep pinpoint area above the gluteal fold Musculoskeletal: no clubbing / cyanosis. No joint deformity upper and lower extremities. Good ROM, no contractures. Normal muscle tone.  Skin: no rashes, lesions, ulcers. No induration Neurologic: No facial asymmetry.  Able to move all extremities.   She is able to speak in full sentences and answer appropriately at times but other times it began to spell out words.  Able to follow some simple commands such as hand grip and lifting lower extremity. Psychiatric: Alert but appears disoriented.   Labs on Admission: I have personally reviewed following labs and imaging studies  CBC: Recent Labs  Lab 09/28/20 2145 10/01/20 1718  WBC 10.4 17.2*  NEUTROABS 7.9* 14.4*  HGB 12.9 13.2  HCT 39.3 38.9  MCV 86.6 86.4  PLT 237 0000000   Basic Metabolic Panel: Recent Labs  Lab 09/28/20 2145 10/01/20 1718  NA 132* 129*  K 3.8 4.0  CL 92* 90*  CO2 29 29  GLUCOSE 95 108*  BUN 27* 28*  CREATININE 1.49* 1.55*  CALCIUM 9.0 9.5  GFR: Estimated Creatinine Clearance: 16.7 mL/min (A) (by C-G formula based on SCr of 1.55 mg/dL (H)). Liver Function Tests: Recent Labs  Lab 10/01/20 1718  AST 15  ALT 10  ALKPHOS 99  BILITOT 0.8  PROT 7.5  ALBUMIN 3.3*   No results for input(s): LIPASE, AMYLASE in the last 168 hours. No results for input(s): AMMONIA in the last 168 hours. Coagulation Profile: No results for input(s): INR, PROTIME in the last 168 hours. Cardiac Enzymes: No results for input(s): CKTOTAL, CKMB, CKMBINDEX, TROPONINI in the last 168 hours. BNP (last 3 results) No results for input(s): PROBNP in the last 8760 hours. HbA1C: No results for input(s): HGBA1C in the last 72 hours. CBG: No results for input(s): GLUCAP in the last 168 hours. Lipid Profile: No results for input(s): CHOL, HDL, LDLCALC, TRIG, CHOLHDL, LDLDIRECT in the last 72 hours. Thyroid Function Tests: No results for input(s): TSH, T4TOTAL, FREET4, T3FREE, THYROIDAB in the last 72 hours. Anemia Panel: No results for input(s): VITAMINB12, FOLATE, FERRITIN, TIBC, IRON, RETICCTPCT in the last 72 hours. Urine analysis:    Component Value Date/Time   COLORURINE YELLOW (A) 10/01/2020 1717   APPEARANCEUR HAZY (A) 10/01/2020 1717   LABSPEC 1.014 10/01/2020 1717    PHURINE 5.0 10/01/2020 1717   GLUCOSEU NEGATIVE 10/01/2020 1717   HGBUR NEGATIVE 10/01/2020 1717   BILIRUBINUR NEGATIVE 10/01/2020 1717   KETONESUR NEGATIVE 10/01/2020 1717   PROTEINUR 100 (A) 10/01/2020 1717   NITRITE NEGATIVE 10/01/2020 1717   LEUKOCYTESUR SMALL (A) 10/01/2020 1717    Radiological Exams on Admission: CT Head Wo Contrast  Result Date: 10/01/2020 CLINICAL DATA:  Mental status change EXAM: CT HEAD WITHOUT CONTRAST TECHNIQUE: Contiguous axial images were obtained from the base of the skull through the vertex without intravenous contrast. COMPARISON:  CT brain 09/07/2020 FINDINGS: Brain: No acute territorial infarction, hemorrhage or intracranial mass. Atrophy and chronic small vessel ischemic changes of the white matter. Chronic left MCA infarct. Stable ventricle size. Vascular: No hyperdense vessels.  Carotid vascular calcification Skull: Normal. Negative for fracture or focal lesion. Sinuses/Orbits: No acute finding. Other: None IMPRESSION: 1. No CT evidence for acute intracranial abnormality. 2. Chronic left MCA infarct. Atrophy and chronic small vessel ischemic changes of the white matter. Electronically Signed   By: Donavan Foil M.D.   On: 10/01/2020 17:59      Assessment/Plan  Altered mental status -Recent UTI could be contributory but more concerning for stroke given her word finding difficulties -Patient had a recent embolic stroke in May and has chronic atrial fibrillation not on anticoagulation.  Obtain stat MRI of the brain  UTI -Recent culture grew E. coli susceptible to Rocephin -Continue IV Rocephin  Hyponatremia -Sodium of 129 -Monitor with repeat labs in the morning following gentle IV continuous fluid  AKI -Creatinine of 1.55 from prior of 0.98 - Gentle IV continuous LR fluid -Avoid nephrotoxic agent  Sacral pressure ulcer wound Stage 1 with one deep pinpoint area above the gluteal fold that is unstagable  Daily wound care  Chronic diastolic  heart failure - Appears euvolemic on exam -Hold Lasix while receiving IV fluids  Chronic atrial fibrillation - Not on anticoagulation -Continue diltiazem  Hypertension -Hold antihypertensives while undergoing stroke work-up  CAD Hx of CVA -Continue Aspirin  DVT prophylaxis:.Lovenox Code Status: DNR- has document at bedside and confirmed by daughter who is healthcare power of attorney Family Communication: Plan discussed with patient at bedside  disposition Plan: ALF with observation Consults called:  Admission status: Observation  Level of care: Med-Surg  Status is: Observation  The patient remains OBS appropriate and will d/c before 2 midnights.  Dispo: The patient is from: ALF              Anticipated d/c is to: ALF              Patient currently is not medically stable to d/c.   Difficult to place patient No         Orene Desanctis DO [facility]   If 7PM-7AM, please contact night-coverage [email or web address]   10/01/2020, 7:56 PM

## 2020-10-01 NOTE — Progress Notes (Signed)
PHARMACIST - PHYSICIAN COMMUNICATION  CONCERNING:  Enoxaparin (Lovenox) for DVT Prophylaxis    RECOMMENDATION: Patient was prescribed enoxaparin '40mg'$  q24 hours for VTE prophylaxis.   Filed Weights   10/01/20 1712  Weight: 48.8 kg (107 lb 9.4 oz)    Body mass index is 19.06 kg/m.  Estimated Creatinine Clearance: 16.7 mL/min (A) (by C-G formula based on SCr of 1.55 mg/dL (H)).   Patient is candidate for enoxaparin '30mg'$  every 24 hours based on CrCl <40m/min or Weight <45kg  DESCRIPTION: [facility]  Patient is now receiving enoxaparin 30 mg every 24 hours   ABenita Gutter8/12/2020 7:57 PM

## 2020-10-01 NOTE — ED Provider Notes (Signed)
[facility] Emergency Department Provider Note   ____________________________________________   Event Date/Time   First MD Initiated Contact with Patient 10/01/20 1705     (approximate)  I have reviewed the triage vital signs and the nursing notes.   HISTORY  Chief Complaint Altered Mental Status    HPI Sheryl Suarez is a 87 y.o. female who presents via EMS from her long-term care facility with complaints of altered mental status.  Patient cannot participate in history or review of systems as she is smelling most of her words and speech is off topic.  Does not follow commands.  EMS states that patient has had worsening mental status over the last 3 days including disorientation, yelling, and spelling words instead of saying them.  Patient was reportedly also seen and treated for a urinary tract infection.           Past Medical History:  Diagnosis Date   Acute respiratory failure with hypoxia (HCC)    Arrhythmia    atrial fibrillation   Breast cancer (HCC)    remission   CHF (congestive heart failure) ([place])    Coronary artery disease    Hypertension    Sepsis due to undetermined organism ([place]) 07/16/2020   Stroke [place])     Patient Active Problem List   Diagnosis Date Noted   AMS (altered mental status) 10/01/2020   Falls 09/08/2020   History of CVA (cerebrovascular accident) 09/08/2020   Goals of care, counseling/discussion 07/16/2020   DNR (do not resuscitate) 07/16/2020   Pressure ulcer 07/12/2020   Acute ischemic stroke ([place]) 07/11/2020   CHF exacerbation ([place])    Frequent falls    Bradycardia    HLD (hyperlipidemia) 04/23/2019   UTI (urinary tract infection) 04/23/2019   Closed fracture of right olecranon process    Fall    Generalized weakness    Closed fracture dislocation of right elbow 01/15/2019   Closed rib fracture 01/15/2019   HTN (hypertension), benign 01/15/2019   CAD (coronary artery disease) 01/15/2019   Atrial  fibrillation, chronic ([place]) 01/15/2019   Rhabdomyolysis 01/15/2019   GI bleed 08/18/2015    Past Surgical History:  Procedure Laterality Date   ABDOMINAL HYSTERECTOMY     APPENDECTOMY     BREAST IMPLANT EXCHANGE     CHOLECYSTECTOMY     ESOPHAGOGASTRODUODENOSCOPY (EGD) WITH PROPOFOL N/A 08/20/2015   Procedure: ESOPHAGOGASTRODUODENOSCOPY (EGD) WITH PROPOFOL;  Surgeon: Lollie Sails, MD;  Location: [facility] ENDOSCOPY;  Service: Endoscopy;  Laterality: N/A;   MASTECTOMY Bilateral    ORIF ELBOW FRACTURE Right 01/16/2019   Procedure: OPEN REDUCTION INTERNAL FIXATION (ORIF) ELBOW/OLECRANON FRACTURE;  Surgeon: Earnestine Leys, MD;  Location: [facility] ORS;  Service: Orthopedics;  Laterality: Right;    Prior to Admission medications   Medication Sig Start Date End Date Taking? Authorizing Provider  acetaminophen (TYLENOL) 325 MG tablet Take 2 tablets (650 mg total) by mouth every 4 (four) hours as needed for mild pain (or temp > 37.5 C (99.5 F)). 07/23/20   Bailey-Modzik, Delila A, NP  aspirin EC 81 MG EC tablet Take 1 tablet (81 mg total) by mouth daily. Swallow whole. 07/24/20   Bailey-Modzik, Delila A, NP  cephALEXin (KEFLEX) 500 MG capsule Take 1 capsule (500 mg total) by mouth 4 (four) times daily for 10 days. 09/29/20 10/09/20  Nance Pear, MD  diltiazem (CARDIZEM CD) 120 MG 24 hr capsule Take 120 mg by mouth daily. 08/21/20   [provider]  Emollient (CETAPHIL) cream Apply  1 application topically daily. Apply to bilateral legs    [provider]  furosemide (LASIX) 20 MG tablet Take 20 mg by mouth daily. 08/30/20   [provider]  lidocaine (LIDODERM) 5 % Place 1 patch onto the skin daily. Apply to lower back.    [provider]  lisinopril (ZESTRIL) 5 MG tablet Take 5 mg by mouth daily. 08/16/20   [provider]  mirtazapine (REMERON) 15 MG tablet Take 15 mg by mouth at bedtime.    [provider]  ondansetron (ZOFRAN-ODT) 4 MG disintegrating  tablet Take 1 tablet (4 mg total) by mouth every 6 (six) hours as needed for nausea. 07/23/20   Bailey-Modzik, Delila A, NP  polyvinyl alcohol (LIQUIFILM TEARS) 1.4 % ophthalmic solution Place 1 drop into both eyes 4 (four) times daily as needed for dry eyes. 07/23/20   Bailey-Modzik, Delila A, NP  senna-docusate (SENOKOT-S) 8.6-50 MG tablet Take 1 tablet by mouth at bedtime as needed for mild constipation. 07/23/20   Bailey-Modzik, Morene Crocker, NP    Allergies Patient has no known allergies.  Family History  Problem Relation Age of Onset   CAD Mother    CAD Father     Social History Social History   Tobacco Use   Smoking status: Never   Smokeless tobacco: Never  Vaping Use   Vaping Use: Never used  Substance Use Topics   Alcohol use: No   Drug use: Never    Review of Systems Unable to assess given mental status ____________________________________________   PHYSICAL EXAM:  VITAL SIGNS: ED Triage Vitals  Enc Vitals Group     BP 10/01/20 1709 (!) 168/73     Pulse Rate 10/01/20 1709 (!) 105     Resp 10/01/20 1709 17     Temp 10/01/20 1709 97.9 F (36.6 C)     Temp Source 10/01/20 1709 Oral     SpO2 10/01/20 1709 96 %     Weight 10/01/20 1712 107 lb 9.4 oz (48.8 kg)     Height 10/01/20 1712 '5\' 3"'$  (1.6 m)     Head Circumference --      Peak Flow --      Pain Score 10/01/20 1711 0     Pain Loc --      Pain Edu? --      Excl. in [place]? --    Constitutional: Alert and disoriented. Well appearing and in no acute distress. Eyes: Conjunctivae are normal. PERRL. Head: Atraumatic. Nose: No congestion/rhinnorhea. Mouth/Throat: Mucous membranes are moist. Neck: No stridor Cardiovascular: Grossly normal heart sounds.  Good peripheral circulation. Respiratory: Normal respiratory effort.  No retractions. Gastrointestinal: Soft and nontender. No distention. Musculoskeletal: No obvious deformities Neurologic: Intermittently spelling out words or simply saying letters that did not  smell anything.  Moves all extremities spontaneously Skin:  Skin is warm and dry. No rash noted. Psychiatric: Cooperative  ____________________________________________   LABS (all labs ordered are listed, but only abnormal results are displayed)  Labs Reviewed  COMPREHENSIVE METABOLIC PANEL - Abnormal; Notable for the following components:      Result Value   Sodium 129 (*)    Chloride 90 (*)    Glucose, Bld 108 (*)    BUN 28 (*)    Creatinine, Ser 1.55 (*)    Albumin 3.3 (*)    GFR, Estimated 31 (*)    All other components within normal limits  BRAIN NATRIURETIC PEPTIDE - Abnormal; Notable for the following components:   B  Natriuretic Peptide 626.0 (*)    All other components within normal limits  CBC WITH DIFFERENTIAL/PLATELET - Abnormal; Notable for the following components:   WBC 17.2 (*)    Neutro Abs 14.4 (*)    Monocytes Absolute 1.3 (*)    All other components within normal limits  URINALYSIS, COMPLETE (UACMP) WITH MICROSCOPIC - Abnormal; Notable for the following components:   Color, Urine YELLOW (*)    APPearance HAZY (*)    Protein, ur 100 (*)    Leukocytes,Ua SMALL (*)    Bacteria, UA RARE (*)    All other components within normal limits  BLOOD GAS, VENOUS - Abnormal; Notable for the following components:   pH, Ven 7.44 (*)    Bicarbonate 33.3 (*)    Acid-Base Excess 7.8 (*)    All other components within normal limits  TROPONIN I (HIGH SENSITIVITY) - Abnormal; Notable for the following components:   Troponin I (High Sensitivity) 35 (*)    All other components within normal limits  CULTURE, BLOOD (ROUTINE X 2)  CULTURE, BLOOD (ROUTINE X 2)  URINE DRUG SCREEN, QUALITATIVE ([facility] ONLY)  TROPONIN I (HIGH SENSITIVITY)   ____________________________________________  EKG  ED ECG REPORT I, Naaman Plummer, the attending physician, personally viewed and interpreted this ECG.  Date: 10/01/2020 EKG Time: 1703 Rate: 92 Rhythm: Atrial fibrillation QRS Axis:  normal Intervals: normal ST/T Wave abnormalities: normal Narrative Interpretation: Atrial fibrillation.  No evidence of acute ischemia  ____________________________________________  RADIOLOGY  ED MD interpretation: CT of the head without contrast shows no evidence of acute abnormalities including no intracerebral hemorrhage, obvious masses, or significant edema.  There is evidence of chronic left MCA infarct  Official radiology report(s): CT Head Wo Contrast  Result Date: 10/01/2020 CLINICAL DATA:  Mental status change EXAM: CT HEAD WITHOUT CONTRAST TECHNIQUE: Contiguous axial images were obtained from the base of the skull through the vertex without intravenous contrast. COMPARISON:  CT brain 09/07/2020 FINDINGS: Brain: No acute territorial infarction, hemorrhage or intracranial mass. Atrophy and chronic small vessel ischemic changes of the white matter. Chronic left MCA infarct. Stable ventricle size. Vascular: No hyperdense vessels.  Carotid vascular calcification Skull: Normal. Negative for fracture or focal lesion. Sinuses/Orbits: No acute finding. Other: None IMPRESSION: 1. No CT evidence for acute intracranial abnormality. 2. Chronic left MCA infarct. Atrophy and chronic small vessel ischemic changes of the white matter. Electronically Signed   By: Donavan Foil M.D.   On: 10/01/2020 17:59    ____________________________________________   PROCEDURES  Procedure(s) performed (including Critical Care):  .1-3 Lead EKG Interpretation  Date/Time: 10/01/2020 7:28 PM Performed by: Naaman Plummer, MD Authorized by: Naaman Plummer, MD     Interpretation: abnormal     ECG rate:  103   ECG rate assessment: tachycardic     Rhythm: sinus tachycardia     Ectopy: none     Conduction: normal     ____________________________________________   INITIAL IMPRESSION / ASSESSMENT AND PLAN / ED COURSE  As part of my medical decision making, I reviewed the following data within the electronic  medical record, if available:  Nursing notes reviewed and incorporated, Labs reviewed, EKG interpreted, Old chart reviewed, Radiograph reviewed and Notes from prior ED visits reviewed and incorporated        Patient presents for altered mental status of unknown origin  Will obtain medical workup and discuss with social work to try to obtain collateral information.  Given History, Physical, and Workup there is no  overt concern for a dangerous emergent cause such as, but not limited to, CNS infection, severe Toxidrome, severe metabolic derangement, or stroke.  Disposition: Admit; the patient is suffering altered mental status that is persistent and therefore they will be admitted.      ____________________________________________   FINAL CLINICAL IMPRESSION(S) / ED DIAGNOSES  Final diagnoses:  Altered mental status, unspecified altered mental status type     ED Discharge Orders     None        Note:  This document was prepared using Dragon voice recognition software and may include unintentional dictation errors.    Naaman Plummer, MD 10/01/20 1929

## 2020-10-02 ENCOUNTER — Observation Stay: Payer: Medicare Other

## 2020-10-02 DIAGNOSIS — Z66 Do not resuscitate: Secondary | ICD-10-CM | POA: Diagnosis present

## 2020-10-02 DIAGNOSIS — E871 Hypo-osmolality and hyponatremia: Secondary | ICD-10-CM | POA: Diagnosis present

## 2020-10-02 DIAGNOSIS — Z853 Personal history of malignant neoplasm of breast: Secondary | ICD-10-CM | POA: Diagnosis not present

## 2020-10-02 DIAGNOSIS — I110 Hypertensive heart disease with heart failure: Secondary | ICD-10-CM | POA: Diagnosis present

## 2020-10-02 DIAGNOSIS — I482 Chronic atrial fibrillation, unspecified: Secondary | ICD-10-CM

## 2020-10-02 DIAGNOSIS — Z9882 Breast implant status: Secondary | ICD-10-CM | POA: Diagnosis not present

## 2020-10-02 DIAGNOSIS — I639 Cerebral infarction, unspecified: Secondary | ICD-10-CM | POA: Diagnosis not present

## 2020-10-02 DIAGNOSIS — N179 Acute kidney failure, unspecified: Secondary | ICD-10-CM | POA: Diagnosis present

## 2020-10-02 DIAGNOSIS — R54 Age-related physical debility: Secondary | ICD-10-CM | POA: Diagnosis present

## 2020-10-02 DIAGNOSIS — R410 Disorientation, unspecified: Secondary | ICD-10-CM | POA: Diagnosis not present

## 2020-10-02 DIAGNOSIS — Z8673 Personal history of transient ischemic attack (TIA), and cerebral infarction without residual deficits: Secondary | ICD-10-CM | POA: Diagnosis not present

## 2020-10-02 DIAGNOSIS — I63511 Cerebral infarction due to unspecified occlusion or stenosis of right middle cerebral artery: Secondary | ICD-10-CM | POA: Diagnosis present

## 2020-10-02 DIAGNOSIS — R4182 Altered mental status, unspecified: Secondary | ICD-10-CM | POA: Diagnosis present

## 2020-10-02 DIAGNOSIS — I2510 Atherosclerotic heart disease of native coronary artery without angina pectoris: Secondary | ICD-10-CM | POA: Diagnosis present

## 2020-10-02 DIAGNOSIS — I25810 Atherosclerosis of coronary artery bypass graft(s) without angina pectoris: Secondary | ICD-10-CM | POA: Diagnosis not present

## 2020-10-02 DIAGNOSIS — Z8249 Family history of ischemic heart disease and other diseases of the circulatory system: Secondary | ICD-10-CM | POA: Diagnosis not present

## 2020-10-02 DIAGNOSIS — Z79899 Other long term (current) drug therapy: Secondary | ICD-10-CM | POA: Diagnosis not present

## 2020-10-02 DIAGNOSIS — Z20822 Contact with and (suspected) exposure to covid-19: Secondary | ICD-10-CM | POA: Diagnosis present

## 2020-10-02 DIAGNOSIS — B9620 Unspecified Escherichia coli [E. coli] as the cause of diseases classified elsewhere: Secondary | ICD-10-CM | POA: Diagnosis present

## 2020-10-02 DIAGNOSIS — R296 Repeated falls: Secondary | ICD-10-CM | POA: Diagnosis present

## 2020-10-02 DIAGNOSIS — Z7982 Long term (current) use of aspirin: Secondary | ICD-10-CM | POA: Diagnosis not present

## 2020-10-02 DIAGNOSIS — Z9013 Acquired absence of bilateral breasts and nipples: Secondary | ICD-10-CM | POA: Diagnosis not present

## 2020-10-02 DIAGNOSIS — I5032 Chronic diastolic (congestive) heart failure: Secondary | ICD-10-CM | POA: Diagnosis present

## 2020-10-02 DIAGNOSIS — I10 Essential (primary) hypertension: Secondary | ICD-10-CM | POA: Diagnosis not present

## 2020-10-02 DIAGNOSIS — N390 Urinary tract infection, site not specified: Secondary | ICD-10-CM | POA: Diagnosis present

## 2020-10-02 DIAGNOSIS — L89151 Pressure ulcer of sacral region, stage 1: Secondary | ICD-10-CM | POA: Diagnosis present

## 2020-10-02 LAB — BASIC METABOLIC PANEL
Anion gap: 9 (ref 5–15)
BUN: 24 mg/dL — ABNORMAL HIGH (ref 8–23)
CO2: 29 mmol/L (ref 22–32)
Calcium: 9 mg/dL (ref 8.9–10.3)
Chloride: 94 mmol/L — ABNORMAL LOW (ref 98–111)
Creatinine, Ser: 1.32 mg/dL — ABNORMAL HIGH (ref 0.44–1.00)
GFR, Estimated: 37 mL/min — ABNORMAL LOW (ref >60)
Glucose, Bld: 95 mg/dL (ref 70–99)
Potassium: 3.5 mmol/L (ref 3.5–5.1)
Sodium: 132 mmol/L — ABNORMAL LOW (ref 135–145)

## 2020-10-02 LAB — SARS CORONAVIRUS 2 (TAT 6-24 HRS): SARS Coronavirus 2: NEGATIVE

## 2020-10-02 IMAGING — MR MR HEAD WO/W CM
10 of 12 series · 39 of 48 positions shown · IV contrast (gadavist)
Comparison: Noncontrast head CT yesterday.

Brain MRI [DATE].

CLINICAL DATA: [AGE] female with unexplained altered mental
status. Status post left MCA infarct [REDACTED].

EXAM:
MRI HEAD WITHOUT AND WITH CONTRAST
TECHNIQUE: Multiplanar, multiecho pulse sequences of the brain and surrounding
structures were obtained without and with intravenous contrast.
CONTRAST:  4mL GADAVIST GADOBUTROL 1 MMOL/ML IV SOLN

[Series 9: ax dwi_tracew · axial · 3.0mm · 0.65mm/px · z∈[-121,+32]mm · 7 of 48 slices shown]
[im 1/48]
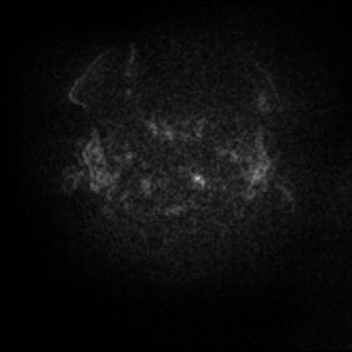
[im 8/48]
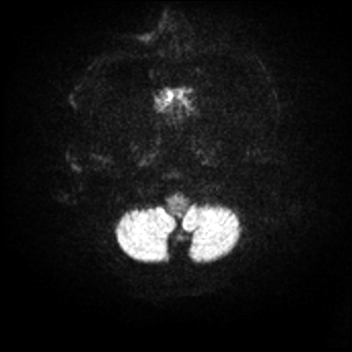
[im 16/48]
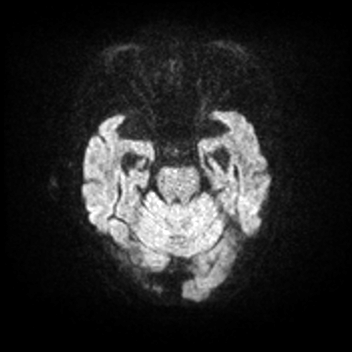
[im 24/48]
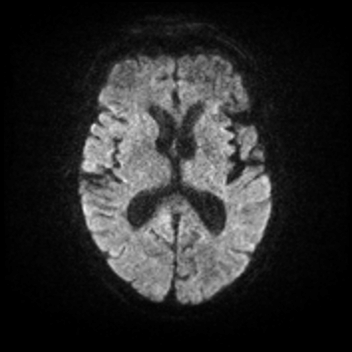
[im 32/48]
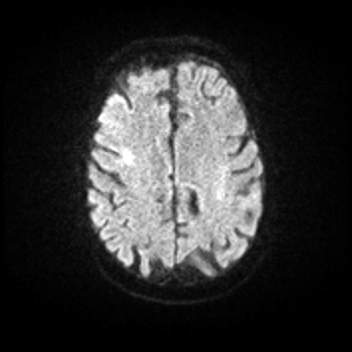
[im 40/48]
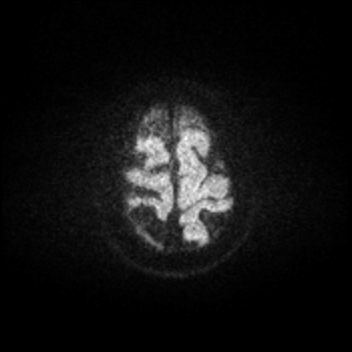
[im 48/48]
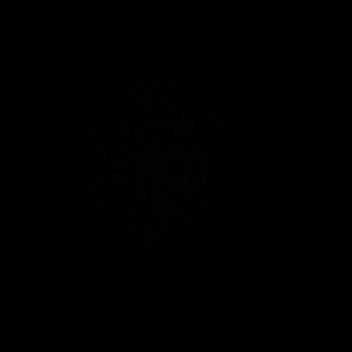

[Series 10: ax dwi_adc · axial · 3.0mm · 0.65mm/px · z∈[-121,+19]mm · 5 of 44 slices shown]
[im 1/44]
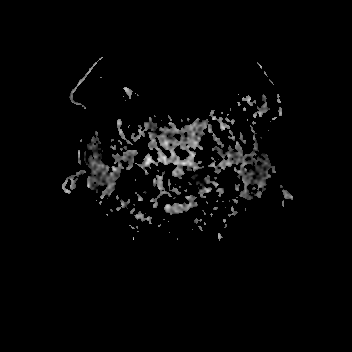
[im 11/44]
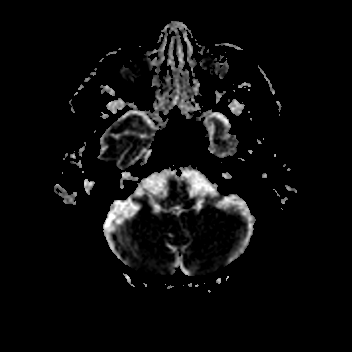
[im 22/44]
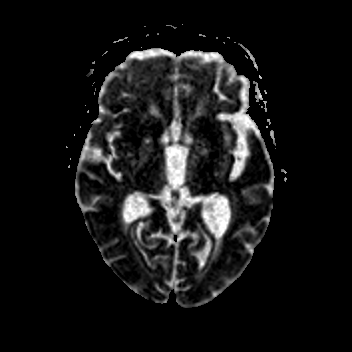
[im 33/44]
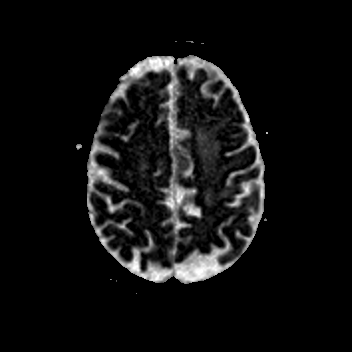
[im 44/44]
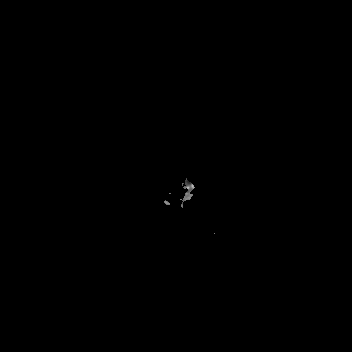

[Series 11: cor dwi_tracew · coronal · 5.0mm · 0.65mm/px · 4 of 40 slices shown]
[im 1/40]
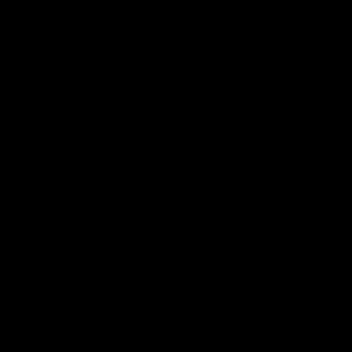
[im 10/40]
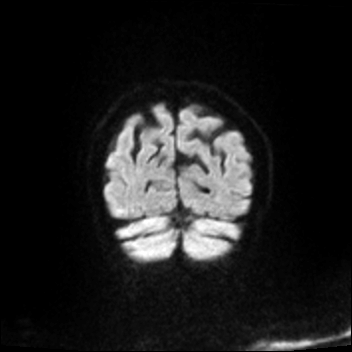
[im 20/40]
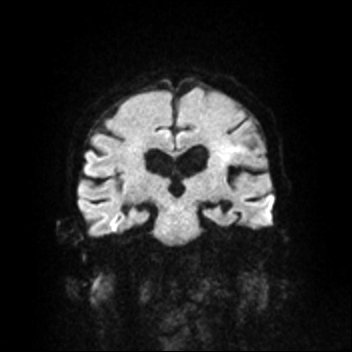
[im 30/40]
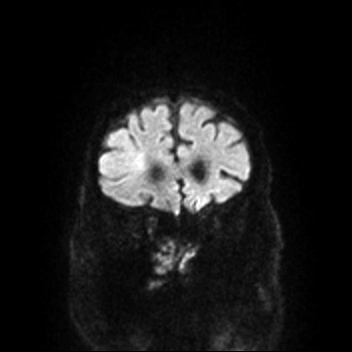

[Series 13: T1 · sagittal · 5.0mm · 0.62mm/px · 3 of 25 slices shown (1 of 2)]
[im 1/25]
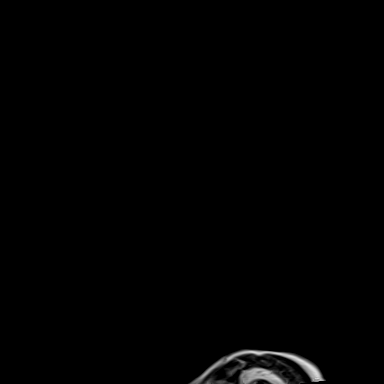
[im 13/25]
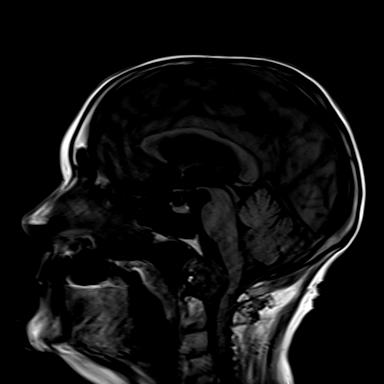
[im 25/25]
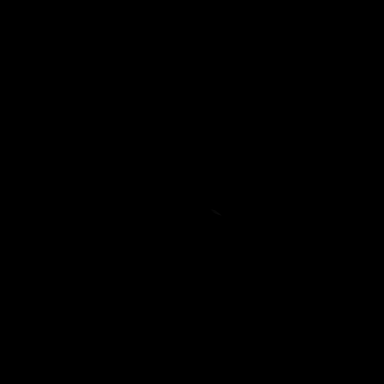

[Series 14: T2 · axial · 5.0mm · 0.53mm/px · z∈[-116,+25]mm · 3 of 25 slices shown (1 of 2)]
[im 1/25]
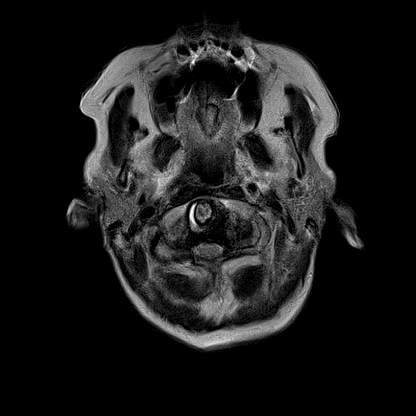
[im 13/25]
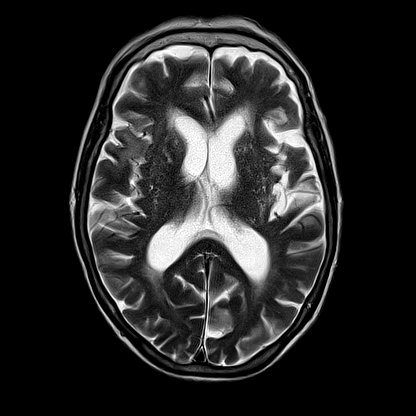
[im 25/25]
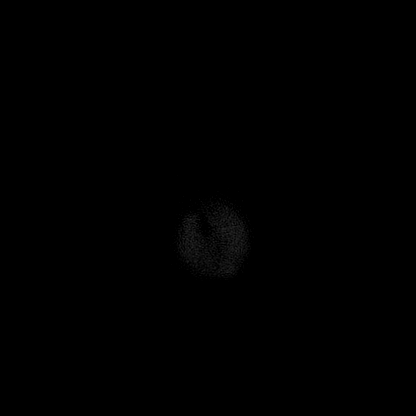

[Series 16: FLAIR · axial · 5.0mm · 1.20mm/px · z∈[-121,+32]mm · 3 of 27 slices shown]
[im 1/27]
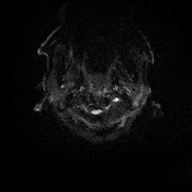
[im 14/27]
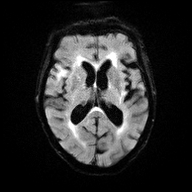
[im 27/27]
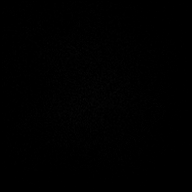

[Series 17: T1 · axial · 5.0mm · 0.90mm/px · z∈[-123,+30]mm · 3 of 27 slices shown (2 of 2)]
[im 1/27]
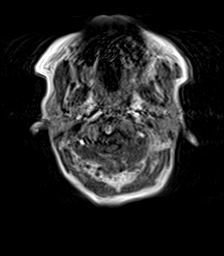
[im 14/27]
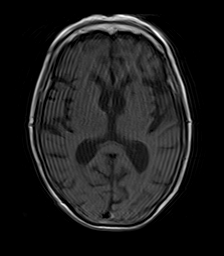
[im 27/27]
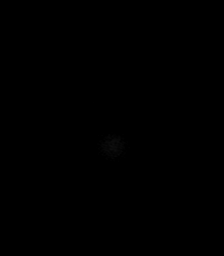

[Series 18: T2 · coronal · 5.0mm · 0.45mm/px · 4 of 31 slices shown (2 of 2)]
[im 1/31]
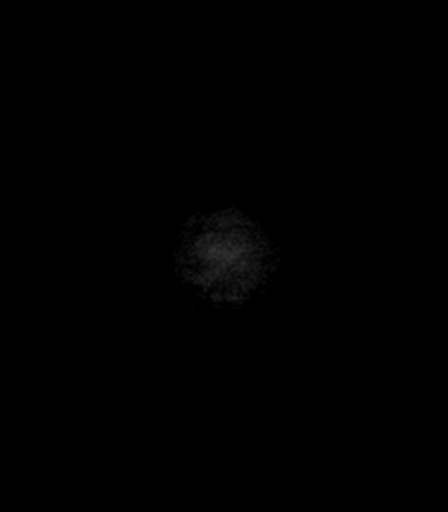
[im 11/31]
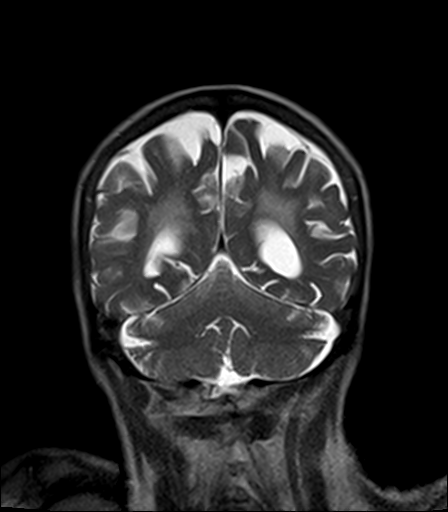
[im 21/31]
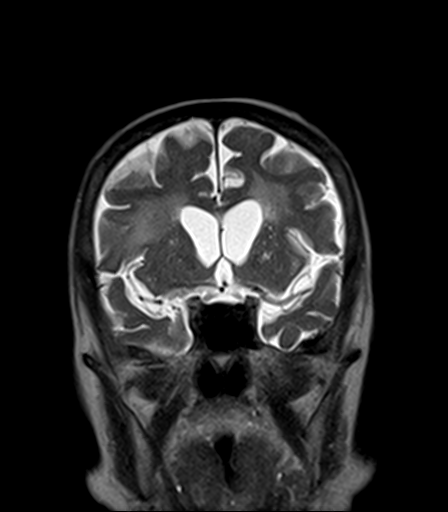
[im 31/31]
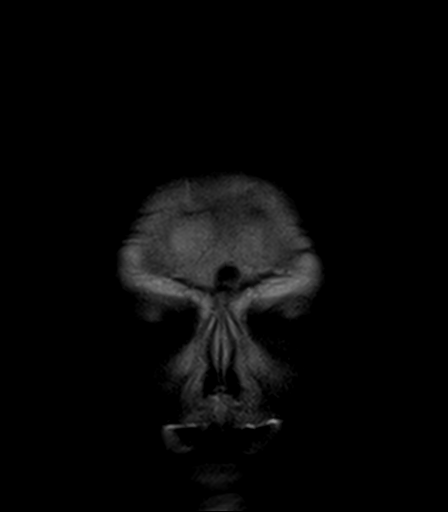

[Series 19: T1 post-contrast · axial · 5.0mm · 0.90mm/px · z∈[-123,+30]mm · 3 of 27 slices shown (1 of 2)]
[im 1/27]
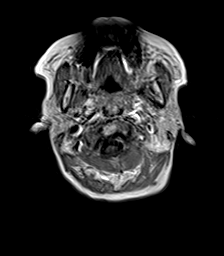
[im 14/27]
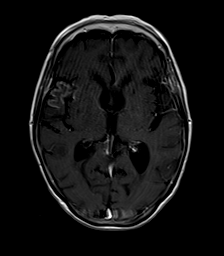
[im 27/27]
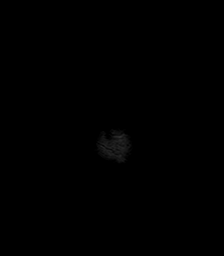

[Series 20: T1 post-contrast · coronal · 5.0mm · 0.90mm/px · 4 of 31 slices shown (2 of 2)]
[im 1/31]
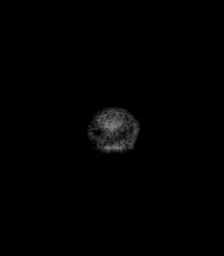
[im 11/31]
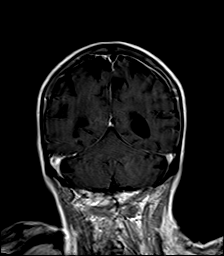
[im 21/31]
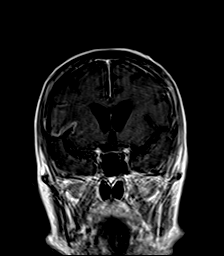
[im 31/31]
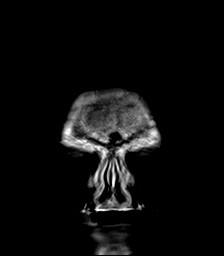

[39 of 48 positions shown; findings below may reference images not displayed]

FINDINGS: Brain: Patchy new restricted diffusion in the anterior right frontal
lobe, anterior right frontal operculum (series 9, image 28) is
associated with confluent new T2 and FLAIR hyperintensity there
extending to the anterior operculum cortex (series 16, image 14) and
post ischemic gyriform enhancement (series 19, image 15). No
associated hemorrhage or mass effect at that site.

Indistinct patchy diffusion restriction also in the left posterior
corona radiata underlying the dominant area of infarct seen in [DATE], image 29 today). Associated increased T2 and FLAIR
hyperintensity there. Developing encephalomalacia in the nearby left
posterior operculum which demonstrates mild laminar necrosis
(intrinsic T1 signal seen on series 17, image 18 +/-mild associated
gyral enhancement.

No other restricted diffusion. No midline shift, mass effect,
evidence of mass lesion, ventriculomegaly, extra-axial collection or
acute intracranial hemorrhage. Pituitary within normal limits.

Underlying confluent bilateral cerebral white matter T2 and FLAIR
hyperintensity. No chronic cerebral blood products or other cortical
encephalomalacia identified. Comparatively mild T2 heterogeneity in
the bilateral deep gray matter nuclei. Brainstem and cerebellum are
largely normal for age.

No other abnormal intracranial enhancement. No definite dural
thickening.

Vascular: Major intracranial vascular flow voids appear stable [REDACTED].

Skull and upper cervical spine: Degenerative cervicomedullary
junction stenosis redemonstrated related to bulky soft tissue pannus
and/or ligamentous hypertrophy about the odontoid (series 13, image
13 today). No obvious cervicomedullary junction signal abnormality.
Background bone marrow signal remains normal.

Sinuses/Orbits: Stable orbits. Paranasal Visualized paranasal
sinuses and mastoids are clear.

Other: Grossly normal visible internal auditory structures. Negative
scalp and face soft tissues.
IMPRESSION: 1. Acute Right MCA territory infarct affecting the anterior
operculum. No associated hemorrhage or mass effect.

2. Evolution of the Left MCA infarct [REDACTED] but with acute or
subacute patchy extension of ischemia there into the posterior left
corona radiata. No associated hemorrhage or mass effect.

3. No other acute intracranial abnormality.
Advanced chronic white matter disease.
Chronic cervicomedullary junction stenosis related to bulky soft
tissue pannus about the odontoid.

## 2020-10-02 MED ORDER — GADOBUTROL 1 MMOL/ML IV SOLN
4.0000 mL | Freq: Once | INTRAVENOUS | Status: AC | PRN
  Administered 2020-10-02: 04:00:00 4 mL via INTRAVENOUS

## 2020-10-02 MED ORDER — CEPHALEXIN 250 MG PO CAPS
250.0000 mg | ORAL_CAPSULE | Freq: Three times a day (TID) | ORAL | Status: DC
  Administered 2020-10-02 – 2020-10-03 (×3): 250 mg via ORAL
  Filled 2020-10-02 (×5): qty 1

## 2020-10-02 NOTE — Progress Notes (Signed)
Pt transported off floor for MRI

## 2020-10-02 NOTE — Progress Notes (Signed)
PROGRESS NOTE    Sheryl Suarez  [id number] DOB: 1924/01/15 DOA: 10/01/2020 PCP: Leonel Ramsay, MD   Brief Narrative: Taken from H&P.  Sheryl Suarez is a 87 y.o. female with medical history significant for embolic CVA, chronic atrial fibrillation, chronic diastolic heart failure, CAD, history of GI bleed and hypertension who presents with concerns of altered mental status.   Daughter who is pt's HCPOA at bedside provides history.  Patient lives in [facility] assisted living facility and was sent over today after she was noted to be in the closet yelling and spelling out words.  At baseline she is able to recognize self, family and year. She was recently evaluated in the ED on 8/8 for altered mental status as well and discharged with Keflex for UTI.  Culture resulted today with E. coli. Per daughter patient has declining health since had a stroke in May and a mechanical fall in July.  CT head was negative but MRI with then acute infarct in right MCA territory and an expansion of prior left MCA infarct.  Most likely secondary to her A. fib as she was not on any anticoagulation. Patient underwent complete stroke work-up in May.  Neurology was consulted and I will defer repetition of that work-up to them.  Subjective: Patient keeps spelling nonsense words, she was able to follow commands.  Unable to communicate.  Per daughter they would like to focus on comfort and treat the treatable's at this time.  She is not full comfort care but no aggressive measures.  Assessment & Plan:   Principal Problem:   AMS (altered mental status) Active Problems:   HTN (hypertension), benign   CAD (coronary artery disease)   Atrial fibrillation, chronic (HCC)   Acute lower UTI   History of CVA (cerebrovascular accident)   AKI (acute kidney injury) ([place])   Hyponatremia   Chronic diastolic CHF (congestive heart failure) (HCC)   Sacral pressure ulcer  Altered mental status secondary to new acute right  MCA infarct.  Patient recently had left MCA infarct in May 2022, most likely secondary to A. fib and being not on any anticoagulation due to GI bleed and falls.  Continue to spell nonsense words.  Not able to communicate at this time but able to follow commands.  Having bilateral upper extremity tremors which seems chronic.  No other obvious focal deficit.  Oriented to self only. Neurology was consulted-I will defer decision regarding further work-up or repeating all the stroke work-up which was recently done in May to them. Patient will remain high risk for further strokes which can lead to death or worsening mental status. Had a discussion with daughter-they would like to have more focus on comfort but treat the treatables with antibiotics if needed.  But no aggressive measures.  Recent UTI.  Patient with recent UTI and was on Keflex.  Which was switched to ceftriaxone on admission.  Urine culture grew E. coli with good sensitivity and sensitive to Keflex. -Switch back to Keflex to complete a 5-day course.  AKI.  Most likely prerenal. -Give some gentle IV fluid -Monitor renal function.  Stage I sacral pressure ulcer, POA. -Daily wound care -Frequently change position  Chronic diastolic heart failure - Appears euvolemic on exam -Hold Lasix while receiving IV fluids due to AKI.  Chronic atrial fibrillation - Not on anticoagulation -Continue diltiazem  Hypertension -Hold antihypertensives while undergoing stroke work-up and permissive hypertension.  CAD Hx of CVA -Continue Aspirin  Objective: Vitals:   10/01/20  2128 10/02/20 0515 10/02/20 0810 10/02/20 1144  BP: (!) 179/80 (!) 146/86 137/64 (!) 155/63  Pulse: 76 92 91 81  Resp: '16 16 18 16  '$ Temp: 98.4 F (36.9 C) 98.3 F (36.8 C) 97.8 F (36.6 C) 98.4 F (36.9 C)  TempSrc:      SpO2: 99% 97% 95% 90%  Weight:      Height:        Intake/Output Summary (Last 24 hours) at 10/02/2020 1546 Last data filed at 10/02/2020  1300 Gross per 24 hour  Intake 356.6 ml  Output 670 ml  Net -313.4 ml   Filed Weights   10/01/20 1712  Weight: 48.8 kg    Examination:  General exam: Frail elderly lady, appears calm and comfortable  Respiratory system: Clear to auscultation. Respiratory effort normal. Cardiovascular system: Irregularly irregular Gastrointestinal system: Soft, nontender, nondistended, bowel sounds positive. Central nervous system: Alert and oriented to self only.  Spelling different words, no other apparent focal deficit. Extremities: No edema, no cyanosis, pulses intact and symmetrical. Psychiatry: Judgement and insight appear impaired  DVT prophylaxis: Lovenox Code Status: DNR Family Communication: Discussed with daughter Disposition Plan:  Status is: Inpatient  Remains inpatient appropriate because:Inpatient level of care appropriate due to severity of illness  Dispo: The patient is from: ALF              Anticipated d/c is to:  To be determined              Patient currently is not medically stable to d/c.   Difficult to place patient No              Level of care: Med-Surg  All the records are reviewed and case discussed with Care Management/Social Worker. Management plans discussed with the patient, nursing and they are in agreement.  Consultants:  Neurology  Procedures:  Antimicrobials:  Keflex  Data Reviewed: I have personally reviewed following labs and imaging studies  CBC: Recent Labs  Lab 09/28/20 2145 10/01/20 1718  WBC 10.4 17.2*  NEUTROABS 7.9* 14.4*  HGB 12.9 13.2  HCT 39.3 38.9  MCV 86.6 86.4  PLT 237 0000000   Basic Metabolic Panel: Recent Labs  Lab 09/28/20 2145 10/01/20 1718 10/02/20 0606  NA 132* 129* 132*  K 3.8 4.0 3.5  CL 92* 90* 94*  CO2 '29 29 29  '$ GLUCOSE 95 108* 95  BUN 27* 28* 24*  CREATININE 1.49* 1.55* 1.32*  CALCIUM 9.0 9.5 9.0   GFR: Estimated Creatinine Clearance: 19.6 mL/min (A) (by C-G formula based on SCr of 1.32 mg/dL  (H)). Liver Function Tests: Recent Labs  Lab 10/01/20 1718  AST 15  ALT 10  ALKPHOS 99  BILITOT 0.8  PROT 7.5  ALBUMIN 3.3*   No results for input(s): LIPASE, AMYLASE in the last 168 hours. No results for input(s): AMMONIA in the last 168 hours. Coagulation Profile: No results for input(s): INR, PROTIME in the last 168 hours. Cardiac Enzymes: No results for input(s): CKTOTAL, CKMB, CKMBINDEX, TROPONINI in the last 168 hours. BNP (last 3 results) No results for input(s): PROBNP in the last 8760 hours. HbA1C: No results for input(s): HGBA1C in the last 72 hours. CBG: No results for input(s): GLUCAP in the last 168 hours. Lipid Profile: No results for input(s): CHOL, HDL, LDLCALC, TRIG, CHOLHDL, LDLDIRECT in the last 72 hours. Thyroid Function Tests: No results for input(s): TSH, T4TOTAL, FREET4, T3FREE, THYROIDAB in the last 72 hours. Anemia Panel: No results for input(s): VITAMINB12,  FOLATE, FERRITIN, TIBC, IRON, RETICCTPCT in the last 72 hours. Sepsis Labs: No results for input(s): PROCALCITON, LATICACIDVEN in the last 168 hours.  Recent Results (from the past 240 hour(s))  Urine Culture     Status: Abnormal   Collection Time: 09/28/20  9:45 PM   Specimen: Urine, Clean Catch  Result Value Ref Range Status   Specimen Description   Final    URINE, CLEAN CATCH Performed at [facility], [address] [place], [place] [address]    Special Requests   Final    NONE Performed at [facility], [place], [place] [address]    Culture >=100,000 COLONIES/mL ESCHERICHIA COLI (A)  Final   Report Status 10/01/2020 FINAL  Final   Organism ID, Bacteria ESCHERICHIA COLI (A)  Final      Susceptibility   Escherichia coli - MIC*    AMPICILLIN 16 INTERMEDIATE Intermediate     CEFAZOLIN <=4 SENSITIVE Sensitive     CEFEPIME <=0.12 SENSITIVE Sensitive     CEFTRIAXONE <=0.25 SENSITIVE Sensitive     CIPROFLOXACIN >=4 RESISTANT Resistant     GENTAMICIN  <=1 SENSITIVE Sensitive     IMIPENEM <=0.25 SENSITIVE Sensitive     NITROFURANTOIN <=16 SENSITIVE Sensitive     TRIMETH/SULFA >=320 RESISTANT Resistant     AMPICILLIN/SULBACTAM 8 SENSITIVE Sensitive     PIP/TAZO <=4 SENSITIVE Sensitive     * >=100,000 COLONIES/mL ESCHERICHIA COLI  Culture, blood (routine x 2)     Status: None (Preliminary result)   Collection Time: 10/01/20  7:30 PM   Specimen: BLOOD  Result Value Ref Range Status   Specimen Description BLOOD RIGHT ANTECUBITAL  Final   Special Requests   Final    BOTTLES DRAWN AEROBIC AND ANAEROBIC Blood Culture adequate volume   Culture   Final    NO GROWTH < 12 HOURS Performed at [facility], [place], [place] [address]    Report Status PENDING  Incomplete  SARS CORONAVIRUS 2 (TAT 6-24 HRS) Nasopharyngeal Nasopharyngeal Swab     Status: None   Collection Time: 10/01/20  7:32 PM   Specimen: Nasopharyngeal Swab  Result Value Ref Range Status   SARS Coronavirus 2 NEGATIVE NEGATIVE Final    Comment: (NOTE) SARS-CoV-2 target nucleic acids are NOT DETECTED.  The SARS-CoV-2 RNA is generally detectable in upper and lower respiratory specimens during the acute phase of infection. Negative results do not preclude SARS-CoV-2 infection, do not rule out co-infections with other pathogens, and should not be used as the sole basis for treatment or other patient management decisions. Negative results must be combined with clinical observations, patient history, and epidemiological information. The expected result is Negative.  Fact Sheet for Patients: SugarRoll.be  Fact Sheet for Healthcare Providers: https://www.woods-mathews.com/  This test is not yet approved or cleared by the [place] FDA and  has been authorized for detection and/or diagnosis of SARS-CoV-2 by FDA under an Emergency Use Authorization (EUA). This EUA will remain  in effect (meaning this test can  be used) for the duration of the COVID-19 declaration under Se ction 564(b)(1) of the Act, 21 U.S.C. section 360bbb-3(b)(1), unless the authorization is terminated or revoked sooner.  Performed at [facility], [place] [address] [place], [place] [address]   Culture, blood (routine x 2)     Status: None (Preliminary result)   Collection Time: 10/01/20  8:32 PM   Specimen: BLOOD  Result Value Ref Range Status   Specimen  Description BLOOD BLOOD RIGHT HAND  Final   Special Requests   Final    BOTTLES DRAWN AEROBIC AND ANAEROBIC Blood Culture adequate volume   Culture   Final    NO GROWTH < 12 HOURS Performed at [facility], [address] [place], [place] [address]    Report Status PENDING  Incomplete     Radiology Studies: CT Head Wo Contrast  Result Date: 10/01/2020 CLINICAL DATA:  Mental status change EXAM: CT HEAD WITHOUT CONTRAST TECHNIQUE: Contiguous axial images were obtained from the base of the skull through the vertex without intravenous contrast. COMPARISON:  CT brain 09/07/2020 FINDINGS: Brain: No acute territorial infarction, hemorrhage or intracranial mass. Atrophy and chronic small vessel ischemic changes of the white matter. Chronic left MCA infarct. Stable ventricle size. Vascular: No hyperdense vessels.  Carotid vascular calcification Skull: Normal. Negative for fracture or focal lesion. Sinuses/Orbits: No acute finding. Other: None IMPRESSION: 1. No CT evidence for acute intracranial abnormality. 2. Chronic left MCA infarct. Atrophy and chronic small vessel ischemic changes of the white matter. Electronically Signed   By: Donavan Foil M.D.   On: 10/01/2020 17:59   MR Brain W and Wo Contrast  Result Date: 10/02/2020 CLINICAL DATA:  87 year old female with unexplained altered mental status. Status post left MCA infarct in May. EXAM: MRI HEAD WITHOUT AND WITH CONTRAST TECHNIQUE: Multiplanar, multiecho pulse sequences of the brain and surrounding structures  were obtained without and with intravenous contrast. CONTRAST:  69m GADAVIST GADOBUTROL 1 MMOL/ML IV SOLN COMPARISON:  Noncontrast head CT yesterday. Brain MRI 07/12/2020. FINDINGS: Brain: Patchy new restricted diffusion in the anterior right frontal lobe, anterior right frontal operculum (series 9, image 28) is associated with confluent new T2 and FLAIR hyperintensity there extending to the anterior operculum cortex (series 16, image 14) and post ischemic gyriform enhancement (series 19, image 15). No associated hemorrhage or mass effect at that site. Indistinct patchy diffusion restriction also in the left posterior corona radiata underlying the dominant area of infarct seen in May (series 9, image 29 today). Associated increased T2 and FLAIR hyperintensity there. Developing encephalomalacia in the nearby left posterior operculum which demonstrates mild laminar necrosis (intrinsic T1 signal seen on series 17, image 18 +/-mild associated gyral enhancement. No other restricted diffusion. No midline shift, mass effect, evidence of mass lesion, ventriculomegaly, extra-axial collection or acute intracranial hemorrhage. Pituitary within normal limits. Underlying confluent bilateral cerebral white matter T2 and FLAIR hyperintensity. No chronic cerebral blood products or other cortical encephalomalacia identified. Comparatively mild T2 heterogeneity in the bilateral deep gray matter nuclei. Brainstem and cerebellum are largely normal for age. No other abnormal intracranial enhancement. No definite dural thickening. Vascular: Major intracranial vascular flow voids appear stable since May. Skull and upper cervical spine: Degenerative cervicomedullary junction stenosis redemonstrated related to bulky soft tissue pannus and/or ligamentous hypertrophy about the odontoid (series 13, image 13 today). No obvious cervicomedullary junction signal abnormality. Background bone marrow signal remains normal. Sinuses/Orbits: Stable  orbits. Paranasal Visualized paranasal sinuses and mastoids are clear. Other: Grossly normal visible internal auditory structures. Negative scalp and face soft tissues. IMPRESSION: 1. Acute Right MCA territory infarct affecting the anterior operculum. No associated hemorrhage or mass effect. 2. Evolution of the Left MCA infarct since May but with acute or subacute patchy extension of ischemia there into the posterior left corona radiata. No associated hemorrhage or mass effect. 3. No other acute intracranial abnormality. Advanced chronic white matter disease. Chronic cervicomedullary junction stenosis related to bulky soft tissue  pannus about the odontoid. Electronically Signed   By: Genevie Ann M.D.   On: 10/02/2020 04:38    Scheduled Meds:  aspirin EC  81 mg Oral Daily   cephALEXin  250 mg Oral Q8H   diltiazem  120 mg Oral Daily   enoxaparin (LOVENOX) injection  30 mg Subcutaneous Q24H   lidocaine  1 patch Transdermal Daily   zinc oxide  1 application Topical TID   Continuous Infusions:   LOS: 0 days   Time spent: 45 minutes. More than 50% of the time was spent in counseling/coordination of care  Lorella Nimrod, MD [facility]  If 7PM-7AM, please contact night-coverage [email or web address]  10/02/2020, 3:46 PM   This record has been created using Systems analyst. Errors have been sought and corrected,but may not always be located. Such creation errors do not reflect on the standard of care.

## 2020-10-02 NOTE — TOC Initial Note (Signed)
Transition of Care [facility]) - Initial/Assessment Note    Patient Details  Name: Sheryl Suarez MRN: [id number] Date of Birth: 12/11/1923  [facility]) CM/SW Contact:    Magnus Ivan, LCSW Phone Number: 10/02/2020, 3:49 PM  Clinical Narrative:         Spoke to patient's daughter Sheryl Suarez. Patient lives at [facility]. She has been there since January 2022. Patient went to [facility] in May for rehab. Patient has a RW, transport chair, lift chair, and hospital bed. Is followed by [facility] at [place]. Sheryl Suarez is unsure if patient will be able to return to [facility]. If not, she is agreeable to SNF and prefers [facility]. Has had 2 COVID vaccines plus the booster.          Expected Discharge Plan: Assisted Living Barriers to Discharge: Continued Medical Work up   Patient Goals and CMS Choice Patient states their goals for this hospitalization and ongoing recovery are:: ALF CMS Medicare.gov Compare Post Acute Care list provided to:: Patient Represenative (must comment) Choice offered to / list presented to : Adult Children  Expected Discharge Plan and Services Expected Discharge Plan: Assisted Living       Living arrangements for the past 2 months: [place]                                      Prior Living Arrangements/Services Living arrangements for the past 2 months: [place] Lives with:: Facility Resident Patient language and need for interpreter reviewed:: Yes        Need for Family Participation in Patient Care: Yes (Comment) Care giver support system in place?: Yes (comment) Current home services: DME, Home OT, Home PT Criminal Activity/Legal Involvement Pertinent to Current Situation/Hospitalization: No - Comment as needed  Activities of Daily Living Home Assistive Devices/Equipment: Other (Comment) (unknown) ADL Screening (condition at time of admission) Patient's cognitive ability adequate to safely  complete daily activities?: No Is the patient deaf or have difficulty hearing?: No Does the patient have difficulty seeing, even when wearing glasses/contacts?: No Does the patient have difficulty concentrating, remembering, or making decisions?: Yes Patient able to express need for assistance with ADLs?: No Does the patient have difficulty dressing or bathing?: Yes Independently performs ADLs?: No Communication: Needs assistance Is this a change from baseline?: Change from baseline, expected to last <3 days Dressing (OT): Needs assistance Grooming: Needs assistance Does the patient have difficulty walking or climbing stairs?: Yes Weakness of Legs: Both Weakness of Arms/Hands: Both  Permission Sought/Granted Permission sought to share information with : Facility Art therapist granted to share information with : Yes, Verbal Permission Granted     Permission granted to share info w AGENCY: [facility], SNFs, [facility], DME agencies        Emotional Assessment       Orientation: : Fluctuating Orientation (Suspected and/or reported Sundowners) Alcohol / Substance Use: Not Applicable Psych Involvement: No (comment)  Admission diagnosis:  Altered mental status, unspecified altered mental status type [R41.82] AMS (altered mental status) [R41.82] Patient Active Problem List   Diagnosis Date Noted   AMS (altered mental status) 10/01/2020   AKI (acute kidney injury) ([place]) 10/01/2020   Hyponatremia 10/01/2020   Chronic diastolic CHF (congestive heart failure) ([place]) 10/01/2020   Sacral pressure ulcer 10/01/2020   Falls 09/08/2020   History of CVA (cerebrovascular accident) 09/08/2020   Goals of  care, counseling/discussion 07/16/2020   DNR (do not resuscitate) 07/16/2020   Pressure ulcer 07/12/2020   Acute ischemic stroke ([place]) 07/11/2020   CHF exacerbation ([place])    Frequent falls    Bradycardia    HLD (hyperlipidemia) 04/23/2019   Acute lower UTI 04/23/2019   Closed  fracture of right olecranon process    Fall    Generalized weakness    Closed fracture dislocation of right elbow 01/15/2019   Closed rib fracture 01/15/2019   HTN (hypertension), benign 01/15/2019   CAD (coronary artery disease) 01/15/2019   Atrial fibrillation, chronic ([place]) 01/15/2019   Rhabdomyolysis 01/15/2019   GI bleed 08/18/2015   PCP:  Leonel Ramsay, MD Pharmacy:   [place], [place] - [address] [place] [address] Phone: [phone] Fax: [phone]     Social Determinants of Health (SDOH) Interventions    Readmission Risk Interventions No flowsheet data found.

## 2020-10-02 NOTE — Consult Note (Addendum)
NEURO HOSPITALIST CONSULT NOTE   Requestig physician: Dr. Reesa Chew  Reason for Consult: Acute stroke seen on MRI  History obtained from:   Chart     HPI:                                                                                                                                          Sheryl Suarez is an 87 y.o. female with a PMHx of chronic a-fib, prior stroke, CHF, breast cancer, CAD and HTN, with recent evaluation in the ED on 8/8 for AMS at which time she was diagnosed and treatment initiated for a UTI, who presented to the ED from [facility] long term care facility yesterday evening after staff called EMS due to patient being found "in the closet and spelling out words." On arrival to the ED, she could not participate in history or review of systems as she seemed to be spelling out most of her words with tangential, off-topic speech. Per EMS, she had been having worsening mental status over the last 3 days including disorientation, yelling, and spelling words instead of saying them. Daughter endorsed that at baseline, the patient is  able to recognize self, family and year. The patient had a stroke in May after which she has had gradual decline, including a mechanical fall in July, worsened memory and decreased mobility. She has never before had the symptoms of "word spelling" that she presented with yesterday.   MRI revealed an acute right MCA infarction involving the anterior operculum. 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 Advanced chronic small vessel disease was also noted.   Past Medical History:  Diagnosis Date   Acute respiratory failure with hypoxia (HCC)    Arrhythmia    atrial fibrillation   Breast cancer (HCC)    remission   CHF (congestive heart failure) (HCC)    Coronary artery disease    Hypertension    Sepsis due to undetermined organism ([place]) 07/16/2020   Stroke [facility])     Past Surgical History:  Procedure Laterality  Date   ABDOMINAL HYSTERECTOMY     APPENDECTOMY     BREAST IMPLANT EXCHANGE     CHOLECYSTECTOMY     ESOPHAGOGASTRODUODENOSCOPY (EGD) WITH PROPOFOL N/A 08/20/2015   Procedure: ESOPHAGOGASTRODUODENOSCOPY (EGD) WITH PROPOFOL;  Surgeon: Lollie Sails, MD;  Location: [facility] ENDOSCOPY;  Service: Endoscopy;  Laterality: N/A;   MASTECTOMY Bilateral    ORIF ELBOW FRACTURE Right 01/16/2019   Procedure: OPEN REDUCTION INTERNAL FIXATION (ORIF) ELBOW/OLECRANON FRACTURE;  Surgeon: Earnestine Leys, MD;  Location: [facility] ORS;  Service: Orthopedics;  Laterality: Right;    Family History  Problem Relation Age of Onset   CAD Mother    CAD Father  Social History:  reports that she has never smoked. She has never used smokeless tobacco. She reports that she does not drink alcohol and does not use drugs.  No Known Allergies  MEDICATIONS:                                                                                                                     Prior to Admission:  Medications Prior to Admission  Medication Sig Dispense Refill Last Dose   acetaminophen (TYLENOL) 325 MG tablet Take 2 tablets (650 mg total) by mouth every 4 (four) hours as needed for mild pain (or temp > 37.5 C (99.5 F)).   Unknown at PRN   cephALEXin (KEFLEX) 500 MG capsule Take 1 capsule (500 mg total) by mouth 4 (four) times daily for 10 days. 40 capsule 0 10/01/2020 at 1400   diltiazem (CARDIZEM CD) 120 MG 24 hr capsule Take 120 mg by mouth daily.   10/01/2020 at 0800   Emollient (CETAPHIL) cream Apply 1 application topically daily. (Apply to bilateral legs)      furosemide (LASIX) 20 MG tablet Take 10 mg by mouth daily.   10/01/2020 at 0800   lidocaine (LIDODERM) 5 % Place 1 patch onto the skin daily. (Apply to lower back and remove after 12 hours)      lisinopril (ZESTRIL) 5 MG tablet Take 5 mg by mouth daily.   09/30/2020 at 0800   ondansetron (ZOFRAN-ODT) 4 MG disintegrating tablet Take 1 tablet (4 mg total) by mouth every  6 (six) hours as needed for nausea. 20 tablet 0 Unknown at PRN   polyvinyl alcohol (LIQUIFILM TEARS) 1.4 % ophthalmic solution Place 1 drop into both eyes 4 (four) times daily as needed for dry eyes. 15 mL 0 Unknown at PRN   senna-docusate (SENOKOT-S) 8.6-50 MG tablet Take 1 tablet by mouth at bedtime as needed for mild constipation.   Unknown at PRN   zinc oxide 20 % ointment Apply 1 application topically 3 (three) times daily. (Apply to sacrum)   10/01/2020 at 1400   aspirin EC 81 MG EC tablet Take 1 tablet (81 mg total) by mouth daily. Swallow whole. 30 tablet 11    Scheduled:  aspirin EC  81 mg Oral Daily   cephALEXin  250 mg Oral Q8H   diltiazem  120 mg Oral Daily   enoxaparin (LOVENOX) injection  30 mg Subcutaneous Q24H   lidocaine  1 patch Transdermal Daily   zinc oxide  1 application Topical TID   Continuous:   ROS:  Unable to obtain a reliable ROS in the context of confusion.   Blood pressure (!) 168/83, pulse (!) 106, temperature 97.8 F (36.6 C), resp. rate 18, height '5\' 3"'$  (1.6 m), weight 48.8 kg, SpO2 91 %.   General Examination:                                                                                                       Physical Exam  HEENT-  [place]/AT  Lungs- Respirations unlabored Extremities- Warm and well perfused  Neurological Examination Mental Status: Initially asleep, she awakens with loud vocalization to an awake, confused state. Speech is sparse and at times with long strings of repetitive/perseverative phonemes that are unintelligible. Answers that can be understood are 1-3 words long. Can state her name, but otherwise is not oriented. Has difficulty following the majority of simple motor commands given to her. Attention is poor. No gross evidence for hemineglect.  Cranial Nerves: II: Unreliable blink to threat bilaterally.  Will briefly gaze at examiner's face and track a moving object to command. No pupillary asymmetry.  III,IV, VI: Eyes conjugate with intact EOM.  V,VII: Reacts to touch bilaterally. Face is symmetric.  VIII: Intact to some questions.  IX,X: No hypophonia XI: Symmetric XII: Midline tongue extension Motor: 5/5 bilateral upper extremities proximally and distally.  4+/5 bilateral distal lower extremities in the context of poor cooperation. Will not flex at hips or extend at knees to command, but briskly withdraws to plantar stimulation bilaterally, without asymmetry.  Sensory: Reacts to touch and irritative stimuli x 4.  Deep Tendon Reflexes: 2+ bilateral brachioradialis. 1+ bilateral patellae.  Plantars: Right: downgoing   Left: downgoing Cerebellar: No gross ataxia noted. Has difficulty following commands.  Gait: Deferred   Lab Results: Basic Metabolic Panel: Recent Labs  Lab 09/28/20 2145 10/01/20 1718 10/02/20 0606  NA 132* 129* 132*  K 3.8 4.0 3.5  CL 92* 90* 94*  CO2 '29 29 29  '$ GLUCOSE 95 108* 95  BUN 27* 28* 24*  CREATININE 1.49* 1.55* 1.32*  CALCIUM 9.0 9.5 9.0    CBC: Recent Labs  Lab 09/28/20 2145 10/01/20 1718  WBC 10.4 17.2*  NEUTROABS 7.9* 14.4*  HGB 12.9 13.2  HCT 39.3 38.9  MCV 86.6 86.4  PLT 237 258    Cardiac Enzymes: No results for input(s): CKTOTAL, CKMB, CKMBINDEX, TROPONINI in the last 168 hours.  Lipid Panel: No results for input(s): CHOL, TRIG, HDL, CHOLHDL, VLDL, LDLCALC in the last 168 hours.  Imaging: CT Head Wo Contrast  Result Date: 10/01/2020 CLINICAL DATA:  Mental status change EXAM: CT HEAD WITHOUT CONTRAST TECHNIQUE: Contiguous axial images were obtained from the base of the skull through the vertex without intravenous contrast. COMPARISON:  CT brain 09/07/2020 FINDINGS: Brain: No acute territorial infarction, hemorrhage or intracranial mass. Atrophy and chronic small vessel ischemic changes of the white matter. Chronic left MCA  infarct. Stable ventricle size. Vascular: No hyperdense vessels.  Carotid vascular calcification Skull: Normal. Negative for fracture or focal lesion. Sinuses/Orbits: No acute finding. Other: None IMPRESSION: 1. No CT evidence for acute  intracranial abnormality. 2. Chronic left MCA infarct. Atrophy and chronic small vessel ischemic changes of the white matter. Electronically Signed   By: Donavan Foil M.D.   On: 10/01/2020 17:59   MR Brain W and Wo Contrast  Result Date: 10/02/2020 CLINICAL DATA:  87 year old female with unexplained altered mental status. Status post left MCA infarct in May. EXAM: MRI HEAD WITHOUT AND WITH CONTRAST TECHNIQUE: Multiplanar, multiecho pulse sequences of the brain and surrounding structures were obtained without and with intravenous contrast. CONTRAST:  23m GADAVIST GADOBUTROL 1 MMOL/ML IV SOLN COMPARISON:  Noncontrast head CT yesterday. Brain MRI 07/12/2020. FINDINGS: Brain: Patchy new restricted diffusion in the anterior right frontal lobe, anterior right frontal operculum (series 9, image 28) is associated with confluent new T2 and FLAIR hyperintensity there extending to the anterior operculum cortex (series 16, image 14) and post ischemic gyriform enhancement (series 19, image 15). No associated hemorrhage or mass effect at that site. Indistinct patchy diffusion restriction also in the left posterior corona radiata underlying the dominant area of infarct seen in May (series 9, image 29 today). Associated increased T2 and FLAIR hyperintensity there. Developing encephalomalacia in the nearby left posterior operculum which demonstrates mild laminar necrosis (intrinsic T1 signal seen on series 17, image 18 +/-mild associated gyral enhancement. No other restricted diffusion. No midline shift, mass effect, evidence of mass lesion, ventriculomegaly, extra-axial collection or acute intracranial hemorrhage. Pituitary within normal limits. Underlying confluent bilateral cerebral white  matter T2 and FLAIR hyperintensity. No chronic cerebral blood products or other cortical encephalomalacia identified. Comparatively mild T2 heterogeneity in the bilateral deep gray matter nuclei. Brainstem and cerebellum are largely normal for age. No other abnormal intracranial enhancement. No definite dural thickening. Vascular: Major intracranial vascular flow voids appear stable since May. Skull and upper cervical spine: Degenerative cervicomedullary junction stenosis redemonstrated related to bulky soft tissue pannus and/or ligamentous hypertrophy about the odontoid (series 13, image 13 today). No obvious cervicomedullary junction signal abnormality. Background bone marrow signal remains normal. Sinuses/Orbits: Stable orbits. Paranasal Visualized paranasal sinuses and mastoids are clear. Other: Grossly normal visible internal auditory structures. Negative scalp and face soft tissues. IMPRESSION: 1. Acute Right MCA territory infarct affecting the anterior operculum. No associated hemorrhage or mass effect. 2. Evolution of the Left MCA infarct since May but with acute or subacute patchy extension of ischemia there into the posterior left corona radiata. No associated hemorrhage or mass effect. 3. No other acute intracranial abnormality. Advanced chronic white matter disease. 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 Electronically Signed   By: HGenevie AnnM.D.   On: 10/02/2020 04:38     Assessment: 87year old female with a history of stroke and atrial fibrillation, presenting with AMS. MRI brain reveals an acute right MCA territory infarct affecting the anterior operculum. 1. Exam reveals unusual speech pattern consisting of perseverative nonsensical "spelling" of words in response to several questions. When closely listening to her, the letters she gives do not spell anything intelligible and at times she repeats word fragments and phonemes that sound  somewhat like letters but are otherwise nonsensical. No focal weakness is noted.  2. MRI brain: Acute Right MCA territory infarct affecting the anterior operculum. No associated hemorrhage or mass effect. Associated cortically-based enhancement suggests that the infarct may be early to late subacute rather than acute. There has been interval evolution of the Left MCA infarct since May but with acute or subacute patchy extension of ischemia there into the  posterior left corona radiata. No associated hemorrhage or mass effect. Advanced chronic white matter disease.  3. Also noted on incidentally imaged portions of the anatomy on brain MRI is chronic cervicomedullary junction stenosis related to bulky soft tissue pannus about the odontoid. 4. CTA of head and neck from May 2022 stroke admission: Occlusion left P2/P3 segment likely acute Moderate to severe stenosis left M3 segment supplying the left frontal parietal lobe. Advanced atherosclerotic disease aortic arch and proximal left subclavian artery. Mild atherosclerotic disease in the carotid bifurcation bilaterally. Moderate stenosis in the cavernous carotid bilaterally due to atherosclerotic disease. Moderate to severe stenosis distal left vertebral artery. 5. Echocardiogram from May did not reveal mural thrombus or valvular vegetation.  6. Medication list at facility reviewed in Epic. No meds with significant sedating or anticohlinergic effect seen.     Recommendations: 1. Given that she has failed ASA monotherapy, can consider adding Plavix, but the increased risk of bleeding may not be outweighed by benefits of possible additional stroke protection. Likely a high falls risk, therefore would not consider anticoagulation.  2. IVF 3. Consider a trial of donepezil  4. Reorient frequently.  5. OOB to chair during the day. Reorient frequently. Encourage PO hydration.  6. TSH level. 7. Repeating her recent stroke work up is not indicated as it would not  change management.  8. May need to discuss palliative care consult with family.  9. Neurology will sign off. Please call if there are additional questions.    Electronically signed: Dr. Kerney Elbe 10/02/2020, 8:29 PM

## 2020-10-03 LAB — BASIC METABOLIC PANEL
Anion gap: 12 (ref 5–15)
BUN: 25 mg/dL — ABNORMAL HIGH (ref 8–23)
CO2: 26 mmol/L (ref 22–32)
Calcium: 9.5 mg/dL (ref 8.9–10.3)
Chloride: 94 mmol/L — ABNORMAL LOW (ref 98–111)
Creatinine, Ser: 1.36 mg/dL — ABNORMAL HIGH (ref 0.44–1.00)
GFR, Estimated: 36 mL/min — ABNORMAL LOW (ref >60)
Glucose, Bld: 125 mg/dL — ABNORMAL HIGH (ref 70–99)
Potassium: 4.1 mmol/L (ref 3.5–5.1)
Sodium: 132 mmol/L — ABNORMAL LOW (ref 135–145)

## 2020-10-03 MED ORDER — LABETALOL HCL 5 MG/ML IV SOLN
20.0000 mg | INTRAVENOUS | Status: DC | PRN
  Administered 2020-10-03 – 2020-10-05 (×2): 20 mg via INTRAVENOUS
  Filled 2020-10-03 (×3): qty 4

## 2020-10-03 MED ORDER — CEPHALEXIN 250 MG/5ML PO SUSR
250.0000 mg | Freq: Three times a day (TID) | ORAL | Status: AC
  Administered 2020-10-03 – 2020-10-05 (×7): 250 mg via ORAL
  Filled 2020-10-03 (×8): qty 5

## 2020-10-03 MED ORDER — HALOPERIDOL LACTATE 5 MG/ML IJ SOLN
1.0000 mg | Freq: Four times a day (QID) | INTRAMUSCULAR | Status: DC | PRN
  Administered 2020-10-03 – 2020-10-04 (×2): 1 mg via INTRAMUSCULAR
  Filled 2020-10-03 (×2): qty 1

## 2020-10-03 NOTE — TOC Progression Note (Addendum)
Transition of Care [facility]) - Progression Note    Patient Details  Name: Sheryl Suarez MRN: [id number] Date of Birth: 17-Apr-1923  Transition of Care [facility]) CM/SW Contact  Sheryl Price, RN Phone Number: 10/03/2020, 2:45 PM  Clinical Narrative: Attempted to reach out to [facility] to discuss appropriate disposition of patient back to [facility]. No answer and no way to leave a voicemail after 5 repeated calls. Will continue to reach out for contact. Sheryl Davies RN CM     312 pm. Spoke at length with daughter and her concerns that Sheryl Suarez will not accept patient back in current condition/needs or that her needs exceed what they can offer as an ALF. She gave the CM a number to call; Sheryl Suarez the Clinical Coordinator. I have left a message with her asking for a return call. If ALF not safe/suitable daughter requests possibility of SNF at [facility]. Will speak to daughter again after I can reach Helen. Sheryl Davies RN CM    Expected Discharge Plan: Assisted Living Barriers to Discharge: Continued Medical Work up  Expected Discharge Plan and Services Expected Discharge Plan: Assisted Living       Living arrangements for the past 2 months: Assisted Living Facility                                       Social Determinants of Health (SDOH) Interventions    Readmission Risk Interventions No flowsheet data found.

## 2020-10-03 NOTE — Evaluation (Addendum)
Physical Therapy Evaluation Patient Details Name: Sheryl Suarez MRN: [id number] DOB: [birth year] Today's Date: 10/03/2020   History of Present Illness  PT is an 87 y.o. female with recent evaluation in the ED on 8/8 for AMS at which time she was diagnosed and treatment initiated for a UT., Pt is from [place] long term care facility yesterday evening after staff called EMS due to patient being found "in the closet and spelling out words." MRi revealed an acute right MCA infarction involving the anterior operculum. 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 PMH: atrial fibrillation not on anticoagulation, CHF, CAD, HTN, and history of breast cancer now in remission.   Clinical Impression  Pt is alert and oriented to name and DOB, disoriented to family member and situation. Pt is able to follow one-step commands with multi-modal cues but verbal communication is limited. The pt attempts to communicate through nonsensical spelling of words. The pt is a poor historian at baseline, and the pt's daughter provided information about PLOF including: ambulated short distances with RW, but fatigues easily at baseline.   The pt required mod-A for bed mobility but is able to reach for bed rails and mobilize BLE through simple commands. Physical assist is necessary for scooting and managing trunk w/ LE to full upright seated position. Transfers required min-guard to min-A with RW. Ambulated in-room with RW, min-guard for safety. Pt is impulsive with gait and requires redirection of task frequently. According to the pt's daughter who was present throughout treatment, the pt is at near baseline function prior to hospital arrival.  Skilled PT intervention is indicated to address deficits in function, mobility, and to return to maximize pt safety. Recommendation is HHPT pending ALF's ability to provide physical assist/adequate supervision. If unable, pt may benefit from SNF.     Follow Up  Recommendations Supervision for mobility/OOB;Home health PT    Equipment Recommendations  None recommended by PT    Recommendations for Other Services       Precautions / Restrictions Precautions Precautions: Fall Restrictions Weight Bearing Restrictions: No      Mobility  Bed Mobility Overal bed mobility: Needs Assistance Bed Mobility: Supine to Sit     Supine to sit: Mod assist;HOB elevated     General bed mobility comments: Pt is able to reach for bed rails and move BLE to EOB with multi-modal cues, requires physical assist for scooting and bringing trunk upright    Transfers Overall transfer level: Needs assistance Equipment used: Rolling walker (2 wheeled) Transfers: Sit to/from Stand Sit to Stand: Min assist;Min guard         General transfer comment: Initial bed > stand required min-A, recliner > stand required min-gaurd for safety  Ambulation/Gait Ambulation/Gait assistance: Min guard Gait Distance (Feet): 20 Feet Assistive device: Rolling walker (2 wheeled) Gait Pattern/deviations: Step-through pattern Gait velocity: Moderate   General Gait Details: Pt is impulsive when walking requiring both verbal & tactile cues for environmental awareness and task redirection  Stairs            Wheelchair Mobility    Modified Rankin (Stroke Patients Only)       Balance Overall balance assessment: Needs assistance Sitting-balance support: Feet supported;No upper extremity supported Sitting balance-Leahy Scale: Fair Sitting balance - Comments: Pt is able to raise arms with BLE supported for short periods of time with minimal posterior trunk lean   Standing balance support: During functional activity;Bilateral upper extremity supported Standing balance-Leahy Scale: Fair  Pertinent Vitals/Pain Pain Assessment: Faces Faces Pain Scale: Hurts little more Pain Location: back Pain Descriptors / Indicators:  Grimacing Pain Intervention(s): Limited activity within patient's tolerance;Monitored during session;Repositioned    Home Living Family/patient expects to be discharged to::  [facility])                 Additional Comments: Pt is a poor historian, per daughter pt residess at [facility]    Prior Function Level of Independence: Needs assistance   Gait / Transfers Assistance Needed: Pt requires supervision for mobility, but is able to ambulate with a RW. Due to AMS pt will leave RW behind.  ADL's / Homemaking Assistance Needed: Pt requires assitance for bathing, but is able to feed herself.  Comments: Pt has a hx of dementia and is a poor historian. Pt's daugther provided baseline functional details.     Hand Dominance   Dominant Hand: Right    Extremity/Trunk Assessment   Upper Extremity Assessment Upper Extremity Assessment: Generalized weakness    Lower Extremity Assessment Lower Extremity Assessment: Generalized weakness (SILT to dorsal, plantar aspects of feet and BLE)       Communication   Communication: HOH  Cognition Arousal/Alertness: Awake/alert Behavior During Therapy: WFL for tasks assessed/performed;Impulsive Overall Cognitive Status: History of cognitive impairments - at baseline                                 General Comments: Pt is oriented to name and DOB. Pt is able to follow one-step commands, but communication is mainly through spelling of nonsensical words.      General Comments      Exercises     Assessment/Plan    PT Assessment Patient needs continued PT services  PT Problem List Decreased strength;Decreased range of motion;Decreased activity tolerance;Decreased balance;Decreased mobility;Decreased coordination;Decreased safety awareness       PT Treatment Interventions Balance training;Neuromuscular re-education;Gait training;Stair training;Functional mobility training;Therapeutic activities;Therapeutic exercise    PT  Goals (Current goals can be found in the Care Plan section)  Acute Rehab PT Goals Patient Stated Goal: return to PLOF PT Goal Formulation: With patient/family Time For Goal Achievement: 10/17/20 Potential to Achieve Goals: Fair    Frequency Min 2X/week   Barriers to discharge        Co-evaluation               AM-PAC PT "6 Clicks" Mobility  Outcome Measure Help needed turning from your back to your side while in a flat bed without using bedrails?: A Little Help needed moving from lying on your back to sitting on the side of a flat bed without using bedrails?: A Lot Help needed moving to and from a bed to a chair (including a wheelchair)?: A Little Help needed standing up from a chair using your arms (e.g., wheelchair or bedside chair)?: A Little Help needed to walk in hospital room?: A Little Help needed climbing 3-5 steps with a railing? : A Little 6 Click Score: 17    End of Session Equipment Utilized During Treatment: Gait belt Activity Tolerance: Patient tolerated treatment well Patient left: in chair;with family/visitor present;with call bell/phone within reach;with chair alarm set Nurse Communication: Mobility status PT Visit Diagnosis: Other abnormalities of gait and mobility (R26.89);Muscle weakness (generalized) (M62.81)    Time: [id number]     Charges:             Jesus Genera, SPT

## 2020-10-03 NOTE — Progress Notes (Addendum)
PROGRESS NOTE    ADELINN Suarez  [id number] DOB: 29-Apr-1923 DOA: 10/01/2020 PCP: Leonel Ramsay, MD   Brief Narrative: Taken from H&P.  Sheryl Suarez is a 87 y.o. female with medical history significant for embolic CVA, chronic atrial fibrillation, chronic diastolic heart failure, CAD, history of GI bleed and hypertension who presents with concerns of altered mental status.   Daughter who is pt's HCPOA at bedside provides history.  Patient lives in [facility] assisted living facility and was sent over today after she was noted to be in the closet yelling and spelling out words.  At baseline she is able to recognize self, family and year. She was recently evaluated in the ED on 8/8 for altered mental status as well and discharged with Keflex for UTI.  Culture resulted today with E. coli. Per daughter patient has declining health since had a stroke in May and a mechanical fall in July.  CT head was negative but MRI with then acute infarct in right MCA territory and an expansion of prior left MCA infarct.  Most likely secondary to her A. fib as she was not on any anticoagulation. Patient underwent complete stroke work-up in May.  Neurology was consulted and they do not recommend repeating the whole work-up.  Plavix can be added but that will increase her chance of bleeding.  She is not a candidate for anticoagulation due to recent GI bleed and multiple falls.  Daughter is concerned that she might not be able to live in an assisted living facility and will need more assistance.  Subjective: Seems to have little improvement in her speech, still spelling some unknown words.  Daughter at bedside.  She was very concerned that she might not be able to live in assisted living facility and asking our case manager help for placement.  Assessment & Plan:   Principal Problem:   AMS (altered mental status) Active Problems:   HTN (hypertension), benign   CAD (coronary artery disease)   Atrial  fibrillation, chronic (HCC)   Acute lower UTI   History of CVA (cerebrovascular accident)   AKI (acute kidney injury) ([place])   Hyponatremia   Chronic diastolic CHF (congestive heart failure) (HCC)   Sacral pressure ulcer  Altered mental status secondary to new acute right MCA infarct.  Patient recently had left MCA infarct in May 2022, most likely secondary to A. fib and being not on any anticoagulation due to GI bleed and falls.  Continue to spell nonsense words.  Not able to communicate at this time but able to follow commands.  Having bilateral upper extremity tremors which seems chronic.  No other obvious focal deficit.  Oriented to self only. Neurology was consulted-they did not think that she needs more work-up as she will remain high risk for recurrent strokes due to her atrial fibrillation and unable to remain on anticoagulation due to GI bleed and recurrent falls. Patient will remain high risk for further strokes which can lead to death or worsening mental status. Had a discussion with daughter-they would like to have more focus on comfort but treat the treatables with antibiotics if needed.  But no aggressive measures.  Recent UTI.  Patient with recent UTI and was on Keflex.  Which was switched to ceftriaxone on admission.  Urine culture grew E. coli with good sensitivity and sensitive to Keflex. -Switch back to Keflex to complete a 5-day course.  AKI.  Most likely prerenal.  Currently seems stable.  Received some gentle IV fluid -  Encourage p.o. hydration -Monitor renal function. -Avoid nephrotoxins  Stage I sacral pressure ulcer, POA. -Daily wound care -Frequently change position  Chronic diastolic heart failure - Appears euvolemic on exam -Keep holding Lasix at this time  Chronic atrial fibrillation - Not on anticoagulation -Continue diltiazem  Hypertension -Can restart home antihypertensives from tomorrow.  CAD Hx of CVA -Continue Aspirin  Objective: Vitals:    10/03/20 0613 10/03/20 0931 10/03/20 1207 10/03/20 1530  BP: 134/67 (!) 117/53 (!) 158/78 (!) 165/81  Pulse: 72 97 87 80  Resp:  '17 17 18  '$ Temp:  97.6 F (36.4 C) 98.2 F (36.8 C) 98.3 F (36.8 C)  TempSrc:  Oral    SpO2:  96% 95% 96%  Weight:      Height:        Intake/Output Summary (Last 24 hours) at 10/03/2020 1701 Last data filed at 10/03/2020 1406 Gross per 24 hour  Intake 240 ml  Output 1050 ml  Net -810 ml    Filed Weights   10/01/20 1712  Weight: 48.8 kg    Examination:  General.  Frail elderly lady, in no acute distress. Pulmonary.  Lungs clear bilaterally, normal respiratory effort. CV.  Irregularly irregular Abdomen.  Soft, nontender, nondistended, BS positive. CNS.  Alert and oriented to self only.   Extremities.  No edema, no cyanosis, pulses intact and symmetrical. Psychiatry.  Judgment and insight appears impaired  DVT prophylaxis: Lovenox Code Status: DNR Family Communication: Discussed with daughter at bedside Disposition Plan:  Status is: Inpatient  Remains inpatient appropriate because:Inpatient level of care appropriate due to severity of illness  Dispo: The patient is from: ALF              Anticipated d/c is to:  To be determined              Patient currently is not medically stable to d/c.   Difficult to place patient No              Level of care: Med-Surg  All the records are reviewed and case discussed with Care Management/Social Worker. Management plans discussed with the patient, nursing and they are in agreement.  Consultants:  Neurology  Procedures:  Antimicrobials:  Keflex  Data Reviewed: I have personally reviewed following labs and imaging studies  CBC: Recent Labs  Lab 09/28/20 2145 10/01/20 1718  WBC 10.4 17.2*  NEUTROABS 7.9* 14.4*  HGB 12.9 13.2  HCT 39.3 38.9  MCV 86.6 86.4  PLT 237 0000000    Basic Metabolic Panel: Recent Labs  Lab 09/28/20 2145 10/01/20 1718 10/02/20 0606 10/03/20 0927  NA 132* 129*  132* 132*  K 3.8 4.0 3.5 4.1  CL 92* 90* 94* 94*  CO2 '29 29 29 26  '$ GLUCOSE 95 108* 95 125*  BUN 27* 28* 24* 25*  CREATININE 1.49* 1.55* 1.32* 1.36*  CALCIUM 9.0 9.5 9.0 9.5    GFR: Estimated Creatinine Clearance: 19.1 mL/min (A) (by C-G formula based on SCr of 1.36 mg/dL (H)). Liver Function Tests: Recent Labs  Lab 10/01/20 1718  AST 15  ALT 10  ALKPHOS 99  BILITOT 0.8  PROT 7.5  ALBUMIN 3.3*    No results for input(s): LIPASE, AMYLASE in the last 168 hours. No results for input(s): AMMONIA in the last 168 hours. Coagulation Profile: No results for input(s): INR, PROTIME in the last 168 hours. Cardiac Enzymes: No results for input(s): CKTOTAL, CKMB, CKMBINDEX, TROPONINI in the last 168 hours. BNP (last 3 results)  No results for input(s): PROBNP in the last 8760 hours. HbA1C: No results for input(s): HGBA1C in the last 72 hours. CBG: No results for input(s): GLUCAP in the last 168 hours. Lipid Profile: No results for input(s): CHOL, HDL, LDLCALC, TRIG, CHOLHDL, LDLDIRECT in the last 72 hours. Thyroid Function Tests: No results for input(s): TSH, T4TOTAL, FREET4, T3FREE, THYROIDAB in the last 72 hours. Anemia Panel: No results for input(s): VITAMINB12, FOLATE, FERRITIN, TIBC, IRON, RETICCTPCT in the last 72 hours. Sepsis Labs: No results for input(s): PROCALCITON, LATICACIDVEN in the last 168 hours.  Recent Results (from the past 240 hour(s))  Urine Culture     Status: Abnormal   Collection Time: 09/28/20  9:45 PM   Specimen: Urine, Clean Catch  Result Value Ref Range Status   Specimen Description   Final    URINE, CLEAN CATCH Performed at [facility], [address] [place], [place] [address]    Special Requests   Final    NONE Performed at [facility], [place], [place] [address]    Culture >=100,000 COLONIES/mL ESCHERICHIA COLI (A)  Final   Report Status 10/01/2020 FINAL  Final   Organism ID, Bacteria ESCHERICHIA COLI  (A)  Final      Susceptibility   Escherichia coli - MIC*    AMPICILLIN 16 INTERMEDIATE Intermediate     CEFAZOLIN <=4 SENSITIVE Sensitive     CEFEPIME <=0.12 SENSITIVE Sensitive     CEFTRIAXONE <=0.25 SENSITIVE Sensitive     CIPROFLOXACIN >=4 RESISTANT Resistant     GENTAMICIN <=1 SENSITIVE Sensitive     IMIPENEM <=0.25 SENSITIVE Sensitive     NITROFURANTOIN <=16 SENSITIVE Sensitive     TRIMETH/SULFA >=320 RESISTANT Resistant     AMPICILLIN/SULBACTAM 8 SENSITIVE Sensitive     PIP/TAZO <=4 SENSITIVE Sensitive     * >=100,000 COLONIES/mL ESCHERICHIA COLI  Culture, blood (routine x 2)     Status: None (Preliminary result)   Collection Time: 10/01/20  7:30 PM   Specimen: BLOOD  Result Value Ref Range Status   Specimen Description BLOOD RIGHT ANTECUBITAL  Final   Special Requests   Final    BOTTLES DRAWN AEROBIC AND ANAEROBIC Blood Culture adequate volume   Culture   Final    NO GROWTH 2 DAYS Performed at [facility], [place], [place] [address]    Report Status PENDING  Incomplete  SARS CORONAVIRUS 2 (TAT 6-24 HRS) Nasopharyngeal Nasopharyngeal Swab     Status: None   Collection Time: 10/01/20  7:32 PM   Specimen: Nasopharyngeal Swab  Result Value Ref Range Status   SARS Coronavirus 2 NEGATIVE NEGATIVE Final    Comment: (NOTE) SARS-CoV-2 target nucleic acids are NOT DETECTED.  The SARS-CoV-2 RNA is generally detectable in upper and lower respiratory specimens during the acute phase of infection. Negative results do not preclude SARS-CoV-2 infection, do not rule out co-infections with other pathogens, and should not be used as the sole basis for treatment or other patient management decisions. Negative results must be combined with clinical observations, patient history, and epidemiological information. The expected result is Negative.  Fact Sheet for Patients: SugarRoll.be  Fact Sheet for Healthcare  Providers: https://www.woods-mathews.com/  This test is not yet approved or cleared by the [place] FDA and  has been authorized for detection and/or diagnosis of SARS-CoV-2 by FDA under an Emergency Use Authorization (EUA). This EUA will remain  in effect (meaning this test can be used) for the duration of  the COVID-19 declaration under Se ction 564(b)(1) of the Act, 21 U.S.C. section 360bbb-3(b)(1), unless the authorization is terminated or revoked sooner.  Performed at [facility], [place] [address] [place], [place] [address]   Culture, blood (routine x 2)     Status: None (Preliminary result)   Collection Time: 10/01/20  8:32 PM   Specimen: BLOOD  Result Value Ref Range Status   Specimen Description BLOOD BLOOD RIGHT HAND  Final   Special Requests   Final    BOTTLES DRAWN AEROBIC AND ANAEROBIC Blood Culture adequate volume   Culture   Final    NO GROWTH 2 DAYS Performed at [facility], [address] [place], [place] [address]    Report Status PENDING  Incomplete      Radiology Studies: CT Head Wo Contrast  Result Date: 10/01/2020 CLINICAL DATA:  Mental status change EXAM: CT HEAD WITHOUT CONTRAST TECHNIQUE: Contiguous axial images were obtained from the base of the skull through the vertex without intravenous contrast. COMPARISON:  CT brain 09/07/2020 FINDINGS: Brain: No acute territorial infarction, hemorrhage or intracranial mass. Atrophy and chronic small vessel ischemic changes of the white matter. Chronic left MCA infarct. Stable ventricle size. Vascular: No hyperdense vessels.  Carotid vascular calcification Skull: Normal. Negative for fracture or focal lesion. Sinuses/Orbits: No acute finding. Other: None IMPRESSION: 1. No CT evidence for acute intracranial abnormality. 2. Chronic left MCA infarct. Atrophy and chronic small vessel ischemic changes of the white matter. Electronically Signed   By: Donavan Foil M.D.   On: 10/01/2020 17:59    MR Brain W and Wo Contrast  Result Date: 10/02/2020 CLINICAL DATA:  87 year old female with unexplained altered mental status. Status post left MCA infarct in May. EXAM: MRI HEAD WITHOUT AND WITH CONTRAST TECHNIQUE: Multiplanar, multiecho pulse sequences of the brain and surrounding structures were obtained without and with intravenous contrast. CONTRAST:  29m GADAVIST GADOBUTROL 1 MMOL/ML IV SOLN COMPARISON:  Noncontrast head CT yesterday. Brain MRI 07/12/2020. FINDINGS: Brain: Patchy new restricted diffusion in the anterior right frontal lobe, anterior right frontal operculum (series 9, image 28) is associated with confluent new T2 and FLAIR hyperintensity there extending to the anterior operculum cortex (series 16, image 14) and post ischemic gyriform enhancement (series 19, image 15). No associated hemorrhage or mass effect at that site. Indistinct patchy diffusion restriction also in the left posterior corona radiata underlying the dominant area of infarct seen in May (series 9, image 29 today). Associated increased T2 and FLAIR hyperintensity there. Developing encephalomalacia in the nearby left posterior operculum which demonstrates mild laminar necrosis (intrinsic T1 signal seen on series 17, image 18 +/-mild associated gyral enhancement. No other restricted diffusion. No midline shift, mass effect, evidence of mass lesion, ventriculomegaly, extra-axial collection or acute intracranial hemorrhage. Pituitary within normal limits. Underlying confluent bilateral cerebral white matter T2 and FLAIR hyperintensity. No chronic cerebral blood products or other cortical encephalomalacia identified. Comparatively mild T2 heterogeneity in the bilateral deep gray matter nuclei. Brainstem and cerebellum are largely normal for age. No other abnormal intracranial enhancement. No definite dural thickening. Vascular: Major intracranial vascular flow voids appear stable since May. Skull and upper cervical spine:  Degenerative cervicomedullary junction stenosis redemonstrated related to bulky soft tissue pannus and/or ligamentous hypertrophy about the odontoid (series 13, image 13 today). No obvious cervicomedullary junction signal abnormality. Background bone marrow signal remains normal. Sinuses/Orbits: Stable orbits. Paranasal Visualized paranasal sinuses and mastoids are clear. Other: Grossly normal visible internal auditory structures. Negative scalp  and face soft tissues. IMPRESSION: 1. Acute Right MCA territory infarct affecting the anterior operculum. No associated hemorrhage or mass effect. 2. Evolution of the Left MCA infarct since May but with acute or subacute patchy extension of ischemia there into the posterior left corona radiata. No associated hemorrhage or mass effect. 3. No other acute intracranial abnormality. Advanced chronic white matter disease. 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 Electronically Signed   By: Genevie Ann M.D.   On: 10/02/2020 04:38    Scheduled Meds:  aspirin EC  81 mg Oral Daily   cephALEXin  250 mg Oral Q8H   diltiazem  120 mg Oral Daily   enoxaparin (LOVENOX) injection  30 mg Subcutaneous Q24H   lidocaine  1 patch Transdermal Daily   zinc oxide  1 application Topical TID   Continuous Infusions:   LOS: 1 day   Time spent: 35 minutes. More than 50% of the time was spent in counseling/coordination of care  Lorella Nimrod, MD [facility]  If 7PM-7AM, please contact night-coverage [email or web address]  10/03/2020, 5:01 PM   This record has been created using Systems analyst. Errors have been sought and corrected,but may not always be located. Such creation errors do not reflect on the standard of care.

## 2020-10-03 NOTE — Plan of Care (Signed)
Pt has had elevated BP in the 160's. But texted on call dr now for BP of 196/90.  Pt is a little agitated calling out letters that don't spell anything.  Does have Afib.

## 2020-10-03 NOTE — Evaluation (Signed)
Clinical/Bedside Swallow Evaluation Patient Details  Name: Sheryl Suarez MRN: [id number] Date of Birth: 11/08/1923  Today's Date: 10/03/2020 Time: SLP Start Time (ACUTE ONLY): 0900 SLP Stop Time (ACUTE ONLY): 0911 SLP Time Calculation (min) (ACUTE ONLY): 11 min  Past Medical History:  Past Medical History:  Diagnosis Date   Acute respiratory failure with hypoxia (HCC)    Arrhythmia    atrial fibrillation   Breast cancer (HCC)    remission   CHF (congestive heart failure) (HCC)    Coronary artery disease    Hypertension    Sepsis due to undetermined organism ([place]) 07/16/2020   Stroke [place] [facility])    Past Surgical History:  Past Surgical History:  Procedure Laterality Date   ABDOMINAL HYSTERECTOMY     APPENDECTOMY     BREAST IMPLANT EXCHANGE     CHOLECYSTECTOMY     ESOPHAGOGASTRODUODENOSCOPY (EGD) WITH PROPOFOL N/A 08/20/2015   Procedure: ESOPHAGOGASTRODUODENOSCOPY (EGD) WITH PROPOFOL;  Surgeon: Lollie Sails, MD;  Location: [facility] ENDOSCOPY;  Service: Endoscopy;  Laterality: N/A;   MASTECTOMY Bilateral    ORIF ELBOW FRACTURE Right 01/16/2019   Procedure: OPEN REDUCTION INTERNAL FIXATION (ORIF) ELBOW/OLECRANON FRACTURE;  Surgeon: Earnestine Leys, MD;  Location: [facility] ORS;  Service: Orthopedics;  Laterality: Right;   HPI:  Sheryl Suarez is an 87 y.o. female with a PMHx of chronic a-fib, prior stroke, CHF, breast cancer, CAD and HTN, with recent evaluation in the ED on 8/8 for AMS at which time she was diagnosed and treatment initiated for a UTI, who presented to the ED from [facility] long term care facility yesterday evening after staff called EMS due to patient being found "in the closet and spelling out words." MRi revealed an acute right MCA infarction involving the anterior operculum. 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 Advanced chronic small vessel disease was also noted.   Assessment / Plan / Recommendation Clinical Impression  Pt presents with adequate  oropharyngeal abilities when consuming thin liquids via straw, puree and regular solids. Pt able to answer yes/no questions with fair accuracy, requested water as well. When consuming french toast, applesauce and milk via straw, pt demonstrated adequate mastication and functional clearance of boluses. Pt's swallow appeared swift. At bedside pt produced cough x 2 with and without consumption. Pt's nurse also provides that pt has a cough when she becomes emotional. At this time, recommend monitoring pt's vital at bedside for any s/s of respiratory decline. As long as pt's respiratory status remains stable, would recommend continuing current diet. Nursing is aware and agreeable with plan. SLP Visit Diagnosis: Dysphagia, unspecified (R13.10)    Aspiration Risk  Mild aspiration risk    Diet Recommendation Regular;Thin liquid   Liquid Administration via: Straw;Cup Medication Administration: Whole meds with liquid Supervision: Full supervision/cueing for compensatory strategies;Staff to assist with self feeding Compensations: Minimize environmental distractions;Slow rate;Small sips/bites Postural Changes: Seated upright at 90 degrees;Remain upright for at least 30 minutes after po intake    Other  Recommendations Oral Care Recommendations: Oral care BID   Follow up Recommendations None      Frequency and Duration   N/A         Prognosis    N/A    Swallow Study   General Date of Onset: 10/01/20 HPI: Sheryl Suarez is an 87 y.o. female with a PMHx of chronic a-fib, prior stroke, CHF, breast cancer, CAD and HTN, with recent evaluation in the ED on 8/8 for AMS at which time she  was diagnosed and treatment initiated for a UTI, who presented to the ED from [facility] long term care facility yesterday evening after staff called EMS due to patient being found "in the closet and spelling out words." MRi revealed an acute right MCA infarction involving the anterior operculum. Her old left MCA stroke  showed some evidence for patchy extension. Advanced chronic small vessel disease was also noted. Type of Study: Bedside Swallow Evaluation Previous Swallow Assessment: none in chart Diet Prior to this Study: Regular;Thin liquids Temperature Spikes Noted: No Respiratory Status: Room air History of Recent Intubation: No Behavior/Cognition: Alert;Cooperative;Pleasant mood Oral Cavity Assessment: Within Functional Limits Oral Care Completed by SLP: No Oral Cavity - Dentition: Adequate natural dentition Vision: Functional for self-feeding Self-Feeding Abilities: Needs assist;Needs set up;Total assist Patient Positioning: Upright in bed Baseline Vocal Quality: Normal Volitional Cough: Cognitively unable to elicit Volitional Swallow: Unable to elicit    Oral/Motor/Sensory Function Overall Oral Motor/Sensory Function: Within functional limits   Ice Chips Ice chips: Not tested   Thin Liquid Thin Liquid: Within functional limits Presentation: Straw    Nectar Thick Nectar Thick Liquid: Not tested   Honey Thick Honey Thick Liquid: Not tested   Puree Puree: Within functional limits Presentation: Spoon   Solid     Solid: Within functional limits Presentation: Spoon     Jacion Dismore B. Rutherford Nail M.S., CCC-SLP, [facility] Rehabilitation Services Office [phone]  Anadelia Kintz Rutherford Nail 10/03/2020,9:27 AM

## 2020-10-04 NOTE — Progress Notes (Signed)
PROGRESS NOTE    Sheryl Suarez  [id number] DOB: [birth year] DOA: 10/01/2020 PCP: Leonel Ramsay, MD   Brief Narrative: Taken from H&P.  Sheryl Suarez is a 87 y.o. female with medical history significant for embolic CVA, chronic atrial fibrillation, chronic diastolic heart failure, CAD, history of GI bleed and hypertension who presents with concerns of altered mental status.   Daughter who is pt's HCPOA at bedside provides history.  Patient lives in [facility] and was sent over today after she was noted to be in the closet yelling and spelling out words.  At baseline she is able to recognize self, family and year. She was recently evaluated in the ED on 8/8 for altered mental status as well and discharged with Keflex for UTI.  Culture resulted today with E. coli. Per daughter patient has declining health since had a stroke in May and a mechanical fall in July.  CT head was negative but MRI with then acute infarct in right MCA territory and an expansion of prior left MCA infarct.  Most likely secondary to her A. fib as she was not on any anticoagulation. Patient underwent complete stroke work-up in May.  Neurology was consulted and they do not recommend repeating the whole work-up.  Plavix can be added but that will increase her chance of bleeding.  She is not a candidate for anticoagulation due to recent GI bleed and multiple falls.  Daughter is concerned that she might not be able to live in an assisted living facility and will need more assistance.  OT is recommending SNF  Subjective: Patient was seen and examined today.  Sitting comfortably in chair and able to communicate.  No new complaints.  Assessment & Plan:   Principal Problem:   AMS (altered mental status) Active Problems:   HTN (hypertension), benign   CAD (coronary artery disease)   Atrial fibrillation, chronic (HCC)   Acute lower UTI   History of CVA (cerebrovascular accident)   AKI (acute  kidney injury) ([place])   Hyponatremia   Chronic diastolic CHF (congestive heart failure) (HCC)   Sacral pressure ulcer  Altered mental status secondary to new acute right MCA infarct.  Patient recently had left MCA infarct in May 2022, most likely secondary to A. fib and being not on any anticoagulation due to GI bleed and falls.  Continue to spell nonsense words.  Not able to communicate at this time but able to follow commands.  Having bilateral upper extremity tremors which seems chronic.  No other obvious focal deficit.  Oriented to self only. Neurology was consulted-they did not think that she needs more work-up as she will remain high risk for recurrent strokes due to her atrial fibrillation and unable to remain on anticoagulation due to GI bleed and recurrent falls. Patient will remain high risk for further strokes which can lead to death or worsening mental status. Had a discussion with daughter-they would like to have more focus on comfort but treat the treatables with antibiotics if needed.  But no aggressive measures.  Recent UTI.  Patient with recent UTI and was on Keflex.  Which was switched to ceftriaxone on admission.  Urine culture grew E. coli with good sensitivity and sensitive to Keflex. -Switch back to Keflex to complete a 5-day course.  AKI.  Most likely prerenal.  Currently seems stable.  Received some gentle IV fluid -Encourage p.o. hydration -Monitor renal function. -Avoid nephrotoxins  Stage I sacral pressure ulcer, POA. -Daily wound care -  Frequently change position  Chronic diastolic heart failure - Appears euvolemic on exam -Keep holding Lasix at this time  Chronic atrial fibrillation - Not on anticoagulation -Continue diltiazem  Hypertension -Can restart home antihypertensives from tomorrow.  CAD Hx of CVA -Continue Aspirin  Objective: Vitals:   10/04/20 0116 10/04/20 0149 10/04/20 0531 10/04/20 0833  BP: (!) 187/82 132/84 (!) 157/98 (!) 166/77   Pulse: 87 85 (!) 109 (!) 103  Resp: '18  18 16  '$ Temp: (!) 97.4 F (36.3 C)  (!) 97.4 F (36.3 C) 97.8 F (36.6 C)  TempSrc:      SpO2: 93%  93% 92%  Weight:      Height:        Intake/Output Summary (Last 24 hours) at 10/04/2020 1558 Last data filed at 10/04/2020 1010 Gross per 24 hour  Intake 420 ml  Output --  Net 420 ml    Filed Weights   10/01/20 1712  Weight: 48.8 kg    Examination:  General.  Frail elderly lady, in no acute distress. Pulmonary.  Lungs clear bilaterally, normal respiratory effort. CV.  Regular rate and rhythm, no JVD, rub or murmur. Abdomen.  Soft, nontender, nondistended, BS positive. CNS.  Alert and oriented to self only.  No focal neurologic deficit. Extremities.  No edema, no cyanosis, pulses intact and symmetrical. Psychiatry.  Judgment and insight appears impaired.  DVT prophylaxis: Lovenox Code Status: DNR Family Communication:  Disposition Plan:  Status is: Inpatient  Remains inpatient appropriate because:Inpatient level of care appropriate due to severity of illness  Dispo: The patient is from: ALF              Anticipated d/c is to:  To be determined (patient will need long-term care most likely memory care unit) TOC is working on it, hopefully some answers earlier next week.              Patient currently is not medically stable to d/c.   Difficult to place patient No              Level of care: Med-Surg  All the records are reviewed and case discussed with Care Management/Social Worker. Management plans discussed with the patient, nursing and they are in agreement.  Consultants:  Neurology  Procedures:  Antimicrobials:  Keflex  Data Reviewed: I have personally reviewed following labs and imaging studies  CBC: Recent Labs  Lab 09/28/20 2145 10/01/20 1718  WBC 10.4 17.2*  NEUTROABS 7.9* 14.4*  HGB 12.9 13.2  HCT 39.3 38.9  MCV 86.6 86.4  PLT 237 0000000    Basic Metabolic Panel: Recent Labs  Lab 09/28/20 2145  10/01/20 1718 10/02/20 0606 10/03/20 0927  NA 132* 129* 132* 132*  K 3.8 4.0 3.5 4.1  CL 92* 90* 94* 94*  CO2 '29 29 29 26  '$ GLUCOSE 95 108* 95 125*  BUN 27* 28* 24* 25*  CREATININE 1.49* 1.55* 1.32* 1.36*  CALCIUM 9.0 9.5 9.0 9.5    GFR: Estimated Creatinine Clearance: 19.1 mL/min (A) (by C-G formula based on SCr of 1.36 mg/dL (H)). Liver Function Tests: Recent Labs  Lab 10/01/20 1718  AST 15  ALT 10  ALKPHOS 99  BILITOT 0.8  PROT 7.5  ALBUMIN 3.3*    No results for input(s): LIPASE, AMYLASE in the last 168 hours. No results for input(s): AMMONIA in the last 168 hours. Coagulation Profile: No results for input(s): INR, PROTIME in the last 168 hours. Cardiac Enzymes: No results for input(s): CKTOTAL,  CKMB, CKMBINDEX, TROPONINI in the last 168 hours. BNP (last 3 results) No results for input(s): PROBNP in the last 8760 hours. HbA1C: No results for input(s): HGBA1C in the last 72 hours. CBG: No results for input(s): GLUCAP in the last 168 hours. Lipid Profile: No results for input(s): CHOL, HDL, LDLCALC, TRIG, CHOLHDL, LDLDIRECT in the last 72 hours. Thyroid Function Tests: No results for input(s): TSH, T4TOTAL, FREET4, T3FREE, THYROIDAB in the last 72 hours. Anemia Panel: No results for input(s): VITAMINB12, FOLATE, FERRITIN, TIBC, IRON, RETICCTPCT in the last 72 hours. Sepsis Labs: No results for input(s): PROCALCITON, LATICACIDVEN in the last 168 hours.  Recent Results (from the past 240 hour(s))  Urine Culture     Status: Abnormal   Collection Time: 09/28/20  9:45 PM   Specimen: Urine, Clean Catch  Result Value Ref Range Status   Specimen Description   Final    URINE, CLEAN CATCH Performed at [facility], [address], [place], [place] [address]    Special Requests   Final    NONE Performed at [facility], [place], [place] [address]    Culture >=100,000 COLONIES/mL ESCHERICHIA COLI (A)  Final   Report Status  10/01/2020 FINAL  Final   Organism ID, Bacteria ESCHERICHIA COLI (A)  Final      Susceptibility   Escherichia coli - MIC*    AMPICILLIN 16 INTERMEDIATE Intermediate     CEFAZOLIN <=4 SENSITIVE Sensitive     CEFEPIME <=0.12 SENSITIVE Sensitive     CEFTRIAXONE <=0.25 SENSITIVE Sensitive     CIPROFLOXACIN >=4 RESISTANT Resistant     GENTAMICIN <=1 SENSITIVE Sensitive     IMIPENEM <=0.25 SENSITIVE Sensitive     NITROFURANTOIN <=16 SENSITIVE Sensitive     TRIMETH/SULFA >=320 RESISTANT Resistant     AMPICILLIN/SULBACTAM 8 SENSITIVE Sensitive     PIP/TAZO <=4 SENSITIVE Sensitive     * >=100,000 COLONIES/mL ESCHERICHIA COLI  Culture, blood (routine x 2)     Status: None (Preliminary result)   Collection Time: 10/01/20  7:30 PM   Specimen: BLOOD  Result Value Ref Range Status   Specimen Description BLOOD RIGHT ANTECUBITAL  Final   Special Requests   Final    BOTTLES DRAWN AEROBIC AND ANAEROBIC Blood Culture adequate volume   Culture   Final    NO GROWTH 3 DAYS Performed at [facility], [place], [place] [address]    Report Status PENDING  Incomplete  SARS CORONAVIRUS 2 (TAT 6-24 HRS) Nasopharyngeal Nasopharyngeal Swab     Status: None   Collection Time: 10/01/20  7:32 PM   Specimen: Nasopharyngeal Swab  Result Value Ref Range Status   SARS Coronavirus 2 NEGATIVE NEGATIVE Final    Comment: (NOTE) SARS-CoV-2 target nucleic acids are NOT DETECTED.  The SARS-CoV-2 RNA is generally detectable in upper and lower respiratory specimens during the acute phase of infection. Negative results do not preclude SARS-CoV-2 infection, do not rule out co-infections with other pathogens, and should not be used as the sole basis for treatment or other patient management decisions. Negative results must be combined with clinical observations, patient history, and epidemiological information. The expected result is Negative.  Fact Sheet for  Patients: SugarRoll.be  Fact Sheet for Healthcare Providers: https://www.woods-mathews.com/  This test is not yet approved or cleared by the [place] FDA and  has been authorized for detection and/or diagnosis of SARS-CoV-2 by FDA under an Emergency Use Authorization (EUA). This EUA will remain  in effect (meaning this test can be used) for the duration of the COVID-19 declaration under Se ction 564(b)(1) of the Act, 21 U.S.C. section 360bbb-3(b)(1), unless the authorization is terminated or revoked sooner.  Performed at [facility], [place] [address], [place], [place] [address]   Culture, blood (routine x 2)     Status: None (Preliminary result)   Collection Time: 10/01/20  8:32 PM   Specimen: BLOOD  Result Value Ref Range Status   Specimen Description BLOOD BLOOD RIGHT HAND  Final   Special Requests   Final    BOTTLES DRAWN AEROBIC AND ANAEROBIC Blood Culture adequate volume   Culture   Final    NO GROWTH 3 DAYS Performed at [facility], [address] [place], [place] [address]    Report Status PENDING  Incomplete      Radiology Studies: No results found.  Scheduled Meds:  aspirin EC  81 mg Oral Daily   cephALEXin  250 mg Oral Q8H   diltiazem  120 mg Oral Daily   enoxaparin (LOVENOX) injection  30 mg Subcutaneous Q24H   lidocaine  1 patch Transdermal Daily   zinc oxide  1 application Topical TID   Continuous Infusions:   LOS: 2 days   Time spent: 32 minutes. More than 50% of the time was spent in counseling/coordination of care  Lorella Nimrod, MD [facility]  If 7PM-7AM, please contact night-coverage [email or web address]  10/04/2020, 3:58 PM   This record has been created using Systems analyst. Errors have been sought and corrected,but may not always be located. Such creation errors do not reflect on the standard of care.

## 2020-10-04 NOTE — Evaluation (Signed)
Occupational Therapy Evaluation Patient Details Name: Sheryl Suarez MRN: [id number] DOB: 27-Apr-1923 Today's Date: 10/04/2020    History of Present Illness PT is an 87 y.o. female with recent evaluation in the ED on 8/8 for AMS at which time she was diagnosed and treatment initiated for a UT., Pt is from [facility] term [facility] facility yesterday evening after staff called EMS due to patient being found "in the closet and spelling out words." MRi revealed an acute right MCA infarction involving the anterior operculum. 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 PMH: atrial fibrillation not on anticoagulation, CHF, CAD, HTN, and history of breast cancer now in remission.   Clinical Impression   Patient presenting with decreased I in self care, balance, functional mobility/transfers, endurance, and safety awareness. Patient is poor historian with expressive difficulties. Per chart review, pt lives at [facility] and has assist for self care and functional mobility PTA. Pt noted to do best with "yes and no" questions. She follows simple 1 step commands with increased time to sequence and initiate. She is very pleasant and agreeable throughout. B UE intentional tremors. Patient currently functioning at min A overall. Patient will benefit from acute OT to increase overall independence in the areas of ADLs, functional mobility, and safety awareness in order to safely discharge to next venue of care.     Follow Up Recommendations  SNF;Supervision/Assistance - 24 hour    Equipment Recommendations  Other (comment) (defer to next venue of care)       Precautions / Restrictions Precautions Precautions: Fall      Mobility Bed Mobility               General bed mobility comments: seated in recliner chair    Transfers Overall transfer level: Needs assistance Equipment used: Rolling walker (2 wheeled) Transfers: Sit to/from Stand Sit to Stand: Min assist                   ADL either performed or assessed with clinical judgement   ADL Overall ADL's : Needs assistance/impaired     Grooming: Wash/dry hands;Wash/dry face;Set up;Supervision/safety;Sitting           Upper Body Dressing : Minimal assistance Upper Body Dressing Details (indicate cue type and reason): to don hospital gown                         Vision Patient Visual Report: No change from baseline              Pertinent Vitals/Pain Pain Assessment: Faces Faces Pain Scale: No hurt     Hand Dominance Right   Extremity/Trunk Assessment Upper Extremity Assessment Upper Extremity Assessment: Generalized weakness (B UE intentional tremor)   Lower Extremity Assessment Lower Extremity Assessment: Generalized weakness       Communication Communication Communication: HOH;Expressive difficulties   Cognition Arousal/Alertness: Awake/alert Behavior During Therapy: WFL for tasks assessed/performed;Flat affect Overall Cognitive Status: History of cognitive impairments - at baseline                                 General Comments: Pt is oriented to name and follows one step commands with increased time. Pt does best with "yes and no" questions. She told me she was at [facility] and not the hospital when asked.  Home Living Family/patient expects to be discharged to:: Skilled nursing facility                                 Additional Comments: Pt is poor historian. Per chart review, pt lives at [facility]      Prior Functioning/Environment Level of Independence: Needs assistance  Gait / Transfers Assistance Needed: Pt requires supervision for mobility, but is able to ambulate with a RW. Due to AMS pt will leave RW behind. ADL's / Homemaking Assistance Needed: Pt requires assitance for bathing, but is able to feed herself.   Comments: Pt with hx of dementia but information provided per chart review and  daughter's report to staff.        OT Problem List: Decreased strength;Decreased coordination;Decreased cognition;Decreased activity tolerance;Decreased safety awareness;Impaired balance (sitting and/or standing);Decreased knowledge of precautions;Decreased knowledge of use of DME or AE      OT Treatment/Interventions: Self-care/ADL training;Balance training;Therapeutic exercise;Therapeutic activities;Neuromuscular education;Energy conservation;Visual/perceptual remediation/compensation;DME and/or AE instruction;Patient/family education;Cognitive remediation/compensation    OT Goals(Current goals can be found in the care plan section) Acute Rehab OT Goals Patient Stated Goal: none stated OT Goal Formulation: With patient Time For Goal Achievement: 10/18/20 Potential to Achieve Goals: Fair ADL Goals Pt Will Perform Grooming: with supervision;standing Pt Will Perform Lower Body Dressing: with min guard assist;sit to/from stand Pt Will Transfer to Toilet: with min assist;ambulating Pt Will Perform Toileting - Clothing Manipulation and hygiene: with min guard assist;sit to/from stand  OT Frequency: Min 2X/week   Barriers to D/C:    none known at this time          AM-PAC OT "6 Clicks" Daily Activity     Outcome Measure Help from another person eating meals?: A Little Help from another person taking care of personal grooming?: A Little Help from another person toileting, which includes using toliet, bedpan, or urinal?: A Lot Help from another person bathing (including washing, rinsing, drying)?: A Lot Help from another person to put on and taking off regular upper body clothing?: A Little Help from another person to put on and taking off regular lower body clothing?: A Lot 6 Click Score: 15   End of Session Nurse Communication: Mobility status  Activity Tolerance: Patient tolerated treatment well Patient left: in chair;with call bell/phone within reach;with chair alarm set  OT  Visit Diagnosis: Unsteadiness on feet (R26.81);Muscle weakness (generalized) (M62.81);History of falling (Z91.81)                Time: [place]:[id number] OT Time Calculation (min): 21 min Charges:  OT General Charges $OT Visit: 1 Visit OT Evaluation $OT Eval Moderate Complexity: 1 Mod OT Treatments $Self Care/Home Management : 8-22 mins  Darleen Crocker, MS, OTR/L , CBIS ascom [phone]  10/04/20, 3:31 PM

## 2020-10-05 MED ORDER — LISINOPRIL 5 MG PO TABS
5.0000 mg | ORAL_TABLET | Freq: Every day | ORAL | Status: DC
  Administered 2020-10-05 – 2020-10-06 (×2): 5 mg via ORAL
  Filled 2020-10-05 (×2): qty 1

## 2020-10-05 MED ORDER — FUROSEMIDE 20 MG PO TABS
10.0000 mg | ORAL_TABLET | Freq: Every day | ORAL | Status: DC
  Administered 2020-10-05 – 2020-10-06 (×2): 10 mg via ORAL
  Filled 2020-10-05 (×2): qty 1

## 2020-10-05 NOTE — Progress Notes (Signed)
PROGRESS NOTE    Sheryl Suarez  [id number] DOB: 1923-10-30 DOA: 10/01/2020 PCP: Leonel Ramsay, MD   Brief Narrative: Taken from H&P.  Sheryl Suarez is a 87 y.o. female with medical history significant for embolic CVA, chronic atrial fibrillation, chronic diastolic heart failure, CAD, history of GI bleed and hypertension who presents with concerns of altered mental status.   Daughter who is pt's HCPOA at bedside provides history.  Patient lives in [facility] assisted living facility and was sent over today after she was noted to be in the closet yelling and spelling out words.  At baseline she is able to recognize self, family and year. She was recently evaluated in the ED on 8/8 for altered mental status as well and discharged with Keflex for UTI.  Culture resulted today with E. coli. Per daughter patient has declining health since had a stroke in May and a mechanical fall in July.  CT head was negative but MRI with then acute infarct in right MCA territory and an expansion of prior left MCA infarct.  Most likely secondary to her A. fib as she was not on any anticoagulation. Patient underwent complete stroke work-up in May.  Neurology was consulted and they do not recommend repeating the whole work-up.  Plavix can be added but that will increase her chance of bleeding.  She is not a candidate for anticoagulation due to recent GI bleed and multiple falls.  Daughter is concerned that she might not be able to live in an assisted living facility and will need more assistance.  PT/OT is recommending SNF  [facility] where the patient was before also came to assessor and they are not comfortable taking her back.  TOC is looking for place for rehab  Subjective: Patient was seen and examined today.  No new complaints.  Assessment & Plan:   Principal Problem:   AMS (altered mental status) Active Problems:   HTN (hypertension), benign   CAD (coronary artery disease)   Atrial  fibrillation, chronic (HCC)   Acute lower UTI   History of CVA (cerebrovascular accident)   AKI (acute kidney injury) ([place])   Hyponatremia   Chronic diastolic CHF (congestive heart failure) (HCC)   Sacral pressure ulcer  Altered mental status secondary to new acute right MCA infarct.  Patient recently had left MCA infarct in May 2022, most likely secondary to A. fib and being not on any anticoagulation due to GI bleed and falls.  Continue to spell nonsense words.  Not able to communicate at this time but able to follow commands.  Having bilateral upper extremity tremors which seems chronic.  No other obvious focal deficit.  Oriented to self only. Neurology was consulted-they did not think that she needs more work-up as she will remain high risk for recurrent strokes due to her atrial fibrillation and unable to remain on anticoagulation due to GI bleed and recurrent falls. Patient will remain high risk for further strokes which can lead to death or worsening mental status. Had a discussion with daughter-they would like to have more focus on comfort but treat the treatables with antibiotics if needed.  But no aggressive measures.  Recent UTI.  Patient with recent UTI and was on Keflex.  Which was switched to ceftriaxone on admission.  Urine culture grew E. coli with good sensitivity and sensitive to Keflex. -Switch back to Keflex to complete a 5-day course.  AKI.  Most likely prerenal.  Currently seems stable.  Received some gentle IV  fluid -Encourage p.o. hydration -Monitor renal function. -Avoid nephrotoxins  Stage I sacral pressure ulcer, POA. -Daily wound care -Frequently change position  Chronic diastolic heart failure - Appears euvolemic on exam -Keep holding Lasix at this time  Chronic atrial fibrillation - Not on anticoagulation -Continue diltiazem  Hypertension -Can restart home antihypertensives from tomorrow.  CAD Hx of CVA -Continue Aspirin  Objective: Vitals:    10/05/20 0033 10/05/20 0537 10/05/20 0735 10/05/20 1340  BP: (!) 148/72 (!) 164/77 (!) 173/72 (!) 158/68  Pulse: 86 75 76 99  Resp: '18 16 16 16  '$ Temp: 97.6 F (36.4 C) 98 F (36.7 C) 97.7 F (36.5 C) 97.6 F (36.4 C)  TempSrc: Oral Oral  Oral  SpO2: 97% 94% 97%   Weight:      Height:        Intake/Output Summary (Last 24 hours) at 10/05/2020 1627 Last data filed at 10/04/2020 1843 Gross per 24 hour  Intake 180 ml  Output --  Net 180 ml    Filed Weights   10/01/20 1712  Weight: 48.8 kg    Examination:  General.  Frail elderly lady, in no acute distress. Pulmonary.  Lungs clear bilaterally, normal respiratory effort. CV.  Regular rate and rhythm, no JVD, rub or murmur. Abdomen.  Soft, nontender, nondistended, BS positive. CNS.  Alert and oriented x3.  No focal neurologic deficit. Extremities.  No edema, no cyanosis, pulses intact and symmetrical. Psychiatry.  Judgment and insight appears impaired.  DVT prophylaxis: Lovenox Code Status: DNR Family Communication:  Disposition Plan:  Status is: Inpatient  Remains inpatient appropriate because:Inpatient level of care appropriate due to severity of illness  Dispo: The patient is from: ALF              Anticipated d/c is to:  To be determined (patient will need long-term care most likely memory care unit) TOC is working on it, hopefully some answers earlier next week.              Patient currently is not medically stable to d/c.   Difficult to place patient No              Level of care: Med-Surg  All the records are reviewed and case discussed with Care Management/Social Worker. Management plans discussed with the patient, nursing and they are in agreement.  Consultants:  Neurology  Procedures:  Antimicrobials:  Keflex  Data Reviewed: I have personally reviewed following labs and imaging studies  CBC: Recent Labs  Lab 09/28/20 2145 10/01/20 1718  WBC 10.4 17.2*  NEUTROABS 7.9* 14.4*  HGB 12.9 13.2  HCT  39.3 38.9  MCV 86.6 86.4  PLT 237 0000000    Basic Metabolic Panel: Recent Labs  Lab 09/28/20 2145 10/01/20 1718 10/02/20 0606 10/03/20 0927  NA 132* 129* 132* 132*  K 3.8 4.0 3.5 4.1  CL 92* 90* 94* 94*  CO2 '29 29 29 26  '$ GLUCOSE 95 108* 95 125*  BUN 27* 28* 24* 25*  CREATININE 1.49* 1.55* 1.32* 1.36*  CALCIUM 9.0 9.5 9.0 9.5    GFR: Estimated Creatinine Clearance: 19.1 mL/min (A) (by C-G formula based on SCr of 1.36 mg/dL (H)). Liver Function Tests: Recent Labs  Lab 10/01/20 1718  AST 15  ALT 10  ALKPHOS 99  BILITOT 0.8  PROT 7.5  ALBUMIN 3.3*    No results for input(s): LIPASE, AMYLASE in the last 168 hours. No results for input(s): AMMONIA in the last 168 hours. Coagulation Profile: No  results for input(s): INR, PROTIME in the last 168 hours. Cardiac Enzymes: No results for input(s): CKTOTAL, CKMB, CKMBINDEX, TROPONINI in the last 168 hours. BNP (last 3 results) No results for input(s): PROBNP in the last 8760 hours. HbA1C: No results for input(s): HGBA1C in the last 72 hours. CBG: No results for input(s): GLUCAP in the last 168 hours. Lipid Profile: No results for input(s): CHOL, HDL, LDLCALC, TRIG, CHOLHDL, LDLDIRECT in the last 72 hours. Thyroid Function Tests: No results for input(s): TSH, T4TOTAL, FREET4, T3FREE, THYROIDAB in the last 72 hours. Anemia Panel: No results for input(s): VITAMINB12, FOLATE, FERRITIN, TIBC, IRON, RETICCTPCT in the last 72 hours. Sepsis Labs: No results for input(s): PROCALCITON, LATICACIDVEN in the last 168 hours.  Recent Results (from the past 240 hour(s))  Urine Culture     Status: Abnormal   Collection Time: 09/28/20  9:45 PM   Specimen: Urine, Clean Catch  Result Value Ref Range Status   Specimen Description   Final    URINE, CLEAN CATCH Performed at [facility], [address], [place], [place] [address]    Special Requests   Final    NONE Performed at [facility], [place], [place] [address]    Culture >=100,000 COLONIES/mL ESCHERICHIA COLI (A)  Final   Report Status 10/01/2020 FINAL  Final   Organism ID, Bacteria ESCHERICHIA COLI (A)  Final      Susceptibility   Escherichia coli - MIC*    AMPICILLIN 16 INTERMEDIATE Intermediate     CEFAZOLIN <=4 SENSITIVE Sensitive     CEFEPIME <=0.12 SENSITIVE Sensitive     CEFTRIAXONE <=0.25 SENSITIVE Sensitive     CIPROFLOXACIN >=4 RESISTANT Resistant     GENTAMICIN <=1 SENSITIVE Sensitive     IMIPENEM <=0.25 SENSITIVE Sensitive     NITROFURANTOIN <=16 SENSITIVE Sensitive     TRIMETH/SULFA >=320 RESISTANT Resistant     AMPICILLIN/SULBACTAM 8 SENSITIVE Sensitive     PIP/TAZO <=4 SENSITIVE Sensitive     * >=100,000 COLONIES/mL ESCHERICHIA COLI  Culture, blood (routine x 2)     Status: None (Preliminary result)   Collection Time: 10/01/20  7:30 PM   Specimen: BLOOD  Result Value Ref Range Status   Specimen Description BLOOD RIGHT ANTECUBITAL  Final   Special Requests   Final    BOTTLES DRAWN AEROBIC AND ANAEROBIC Blood Culture adequate volume   Culture   Final    NO GROWTH 4 DAYS Performed at [facility], [place], [place] [address]    Report Status PENDING  Incomplete  SARS CORONAVIRUS 2 (TAT 6-24 HRS) Nasopharyngeal Nasopharyngeal Swab     Status: None   Collection Time: 10/01/20  7:32 PM   Specimen: Nasopharyngeal Swab  Result Value Ref Range Status   SARS Coronavirus 2 NEGATIVE NEGATIVE Final    Comment: (NOTE) SARS-CoV-2 target nucleic acids are NOT DETECTED.  The SARS-CoV-2 RNA is generally detectable in upper and lower respiratory specimens during the acute phase of infection. Negative results do not preclude SARS-CoV-2 infection, do not rule out co-infections with other pathogens, and should not be used as the sole basis for treatment or other patient management decisions. Negative results must be combined with clinical observations, patient history, and epidemiological  information. The expected result is Negative.  Fact Sheet for Patients: SugarRoll.be  Fact Sheet for Healthcare Providers: https://www.woods-mathews.com/  This test is not yet approved or cleared by the [place] FDA and  has been authorized for detection  and/or diagnosis of SARS-CoV-2 by FDA under an Emergency Use Authorization (EUA). This EUA will remain  in effect (meaning this test can be used) for the duration of the COVID-19 declaration under Se ction 564(b)(1) of the Act, 21 U.S.C. section 360bbb-3(b)(1), unless the authorization is terminated or revoked sooner.  Performed at [facility], [place] [address] [place], [place] [address]   Culture, blood (routine x 2)     Status: None (Preliminary result)   Collection Time: 10/01/20  8:32 PM   Specimen: BLOOD  Result Value Ref Range Status   Specimen Description BLOOD BLOOD RIGHT HAND  Final   Special Requests   Final    BOTTLES DRAWN AEROBIC AND ANAEROBIC Blood Culture adequate volume   Culture   Final    NO GROWTH 4 DAYS Performed at [facility], [address] [place], [place] [address]    Report Status PENDING  Incomplete      Radiology Studies: No results found.  Scheduled Meds:  aspirin EC  81 mg Oral Daily   cephALEXin  250 mg Oral Q8H   diltiazem  120 mg Oral Daily   enoxaparin (LOVENOX) injection  30 mg Subcutaneous Q24H   furosemide  10 mg Oral Daily   lidocaine  1 patch Transdermal Daily   lisinopril  5 mg Oral Daily   zinc oxide  1 application Topical TID   Continuous Infusions:   LOS: 3 days   Time spent: 28 minutes. More than 50% of the time was spent in counseling/coordination of care  Lorella Nimrod, MD [facility]  If 7PM-7AM, please contact night-coverage [email or web address]  10/05/2020, 4:27 PM   This record has been created using Systems analyst. Errors have been sought and corrected,but may not always be  located. Such creation errors do not reflect on the standard of care.

## 2020-10-05 NOTE — NC FL2 (Signed)
[place] LEVEL OF CARE SCREENING TOOL     IDENTIFICATION  Patient Name: Sheryl Suarez Birthdate: [birth year] Sex: female Admission Date (Current Location): 10/01/2020  [facility] and [place] Number:  Engineering geologist and Address:  [facility], [address], [place], [place] [address]      Provider Number: [id number]  Attending Physician Name and Address:  Lorella Nimrod, MD  Relative Name and Phone Number:  [name] (daughter) [phone]    Current Level of Care: Hospital Recommended Level of Care: [place] Prior Approval Number:    Date Approved/Denied:   PASRR Number: [id number]  Discharge Plan: SNF    Current Diagnoses: Patient Active Problem List   Diagnosis Date Noted   AMS (altered mental status) 10/01/2020   AKI (acute kidney injury) ([place]) 10/01/2020   Hyponatremia 10/01/2020   Chronic diastolic CHF (congestive heart failure) ([place]) 10/01/2020   Sacral pressure ulcer 10/01/2020   Falls 09/08/2020   History of CVA (cerebrovascular accident) 09/08/2020   Goals of care, counseling/discussion 07/16/2020   DNR (do not resuscitate) 07/16/2020   Pressure ulcer 07/12/2020   Acute ischemic stroke ([place]) 07/11/2020   CHF exacerbation ([place])    Frequent falls    Bradycardia    HLD (hyperlipidemia) 04/23/2019   Acute lower UTI 04/23/2019   Closed fracture of right olecranon process    Fall    Generalized weakness    Closed fracture dislocation of right elbow 01/15/2019   Closed rib fracture 01/15/2019   HTN (hypertension), benign 01/15/2019   CAD (coronary artery disease) 01/15/2019   Atrial fibrillation, chronic ([place]) 01/15/2019   Rhabdomyolysis 01/15/2019   GI bleed 08/18/2015    Orientation RESPIRATION BLADDER Height & Weight     Self  Normal Incontinent, External catheter Weight: 107 lb 9.4 oz (48.8 kg) Height:  '5\' 3"'$  (160 cm)  BEHAVIORAL SYMPTOMS/MOOD NEUROLOGICAL BOWEL NUTRITION STATUS       Continent Diet (see discharge summary)  AMBULATORY STATUS COMMUNICATION OF NEEDS Skin   Extensive Assist Verbally Other (Comment) (Pressure Injury stage 1 sacrum)                       Personal Care Assistance Level of Assistance  Bathing, Dressing, Feeding, Total care Bathing Assistance: Limited assistance Feeding assistance: Independent Dressing Assistance: Limited assistance Total Care Assistance: Maximum assistance   Functional Limitations Info  Sight, Hearing, Speech Sight Info: Adequate Hearing Info: Adequate Speech Info: Adequate    SPECIAL CARE FACTORS FREQUENCY  PT (By licensed PT), OT (By licensed OT)     PT Frequency: min 4x weekly OT Frequency: min 4x weekly            Contractures Contractures Info: Not present    Additional Factors Info  Code Status, Allergies Code Status Info: DNR Allergies Info: No Known Allergies           Current Medications (10/05/2020):  This is the current hospital active medication list Current Facility-Administered Medications  Medication Dose Route Frequency Provider Last Rate Last Admin   acetaminophen (TYLENOL) tablet 650 mg  650 mg Oral Q4H PRN Tu, Ching T, DO       aspirin EC tablet 81 mg  81 mg Oral Daily Tu, Ching T, DO   81 mg at 10/05/20 0747   cephALEXin (KEFLEX) 250 MG/5ML suspension 250 mg  250 mg Oral Q8H Rauer, Samantha O, RPH   250 mg at 10/05/20 1345   diltiazem (CARDIZEM CD) 24  hr capsule 120 mg  120 mg Oral Daily Tu, Ching T, DO   120 mg at 10/05/20 0747   enoxaparin (LOVENOX) injection 30 mg  30 mg Subcutaneous Q24H Tu, Ching T, DO   30 mg at 10/04/20 2058   furosemide (LASIX) tablet 10 mg  10 mg Oral Daily Lorella Nimrod, MD   10 mg at 10/05/20 1341   haloperidol lactate (HALDOL) injection 1 mg  1 mg Intramuscular Q6H PRN Mansy, Jan A, MD   1 mg at 10/04/20 0506   labetalol (NORMODYNE) injection 20 mg  20 mg Intravenous Q3H PRN Mansy, Jan A, MD   20 mg at 10/03/20 0511   lidocaine (LIDODERM) 5 % 1 patch   1 patch Transdermal Daily Tu, Ching T, DO   1 patch at 10/05/20 0746   lisinopril (ZESTRIL) tablet 5 mg  5 mg Oral Daily Lorella Nimrod, MD   5 mg at 10/05/20 1342   zinc oxide 20 % ointment 1 application  1 application Topical TID Tu, Ching T, DO   1 application at [id number]     Discharge Medications: Please see discharge summary for a list of discharge medications.  Relevant Imaging Results:  Relevant Lab Results:   Additional Information SSN: [id number]  Alberteen Sam, LCSW

## 2020-10-05 NOTE — TOC Progression Note (Signed)
Transition of Care [facility]) - Progression Note    Patient Details  Name: Sheryl Suarez MRN: [id number] Date of Birth: 1923-05-22  Transition of Care [facility]) CM/SW Contact  Shelbie Hutching, RN Phone Number: 10/05/2020, 3:53 PM  Clinical Narrative:    [facility] came to assess patient today and they are not comfortable accepting patient back with her current recommendation for 24 hour supervision.  Patient's daughter agrees with rehab and prefers [facility] as that is where the patient was a couple of months ago.  SNF workup started and bed request sent out to [facility].    Expected Discharge Plan: Skilled Nursing Facility Barriers to Discharge: SNF Pending bed offer  Expected Discharge Plan and Services Expected Discharge Plan: [place] arrangements for the past 2 months: Assisted Living Facility                                       Social Determinants of Health (SDOH) Interventions    Readmission Risk Interventions No flowsheet data found.

## 2020-10-05 NOTE — Progress Notes (Signed)
Physical Therapy Treatment Patient Details Name: Sheryl Suarez MRN: [id number] DOB: February 06, 1924 Today's Date: 10/05/2020    History of Present Illness PT is an 87 y.o. female with recent evaluation in the ED on 8/8 for AMS at which time she was diagnosed and treatment initiated for a UT., Pt is from [facility] long term care facility yesterday evening after staff called EMS due to patient being found "in the closet and spelling out words." MRi revealed an acute right MCA infarction involving the anterior operculum. 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 PMH: atrial fibrillation not on anticoagulation, CHF, CAD, HTN, and history of breast cancer now in remission.    PT Comments    Patient  alert, speech a bit more clear, mostly 1-2 words at a time. Redirectable as needed. Pt requested to use BSC, able to void a small amount, maxA for pericare. Pt able to perform several seated exercises with constant verbal and visual cueing. Sit <> stand with RW with CGA, without RW, minA for steadying, pt somewhat impulsive with transfers. She ambulated ~31f with RW and minA for RW guidance, noted pt voided while ambulating, returned to recliner and clean up. The patient would benefit from further skilled PT intervention to continue to progress towards goals. Recommendation updated to SNF due to family reporting ALF has been by to assess pt, stated that they are unable to take her back. SNF recommended due to pt rehab potential to maximize safety and function.     Follow Up Recommendations  Supervision for mobility/OOB;SNF     Equipment Recommendations  None recommended by PT    Recommendations for Other Services       Precautions / Restrictions Precautions Precautions: Fall Restrictions Weight Bearing Restrictions: No    Mobility  Bed Mobility               General bed mobility comments: seated in recliner chair    Transfers Overall transfer level: Needs  assistance Equipment used: Rolling walker (2 wheeled) Transfers: Sit to/from Stand Sit to Stand: Min guard;Min assist         General transfer comment: minA with RW for steadying  Ambulation/Gait Ambulation/Gait assistance: Min guard;Min assist Gait Distance (Feet): 10 Feet Assistive device: Rolling walker (2 wheeled)       General Gait Details: pt somewhat impulsive assistance for RW guidance   Stairs             Wheelchair Mobility    Modified Rankin (Stroke Patients Only)       Balance Overall balance assessment: Needs assistance Sitting-balance support: Feet supported;No upper extremity supported Sitting balance-Leahy Scale: Fair Sitting balance - Comments: Pt is able to raise arms with BLE supported   Standing balance support: During functional activity;Bilateral upper extremity supported Standing balance-Leahy Scale: Poor Standing balance comment: reliant on UE support                            Cognition Arousal/Alertness: Awake/alert Behavior During Therapy: WFL for tasks assessed/performed;Flat affect Overall Cognitive Status: History of cognitive impairments - at baseline                                 General Comments: Pt is oriented to name and follows one step commands with increased time. Pt does best with "yes and no" questions.      Exercises  Other Exercises Other Exercises: Patient able to perform seated LAQ, ankle pumps, and marches with constant verbal and visual cues Other Exercises: maxA for pericare after use of BSC    General Comments        Pertinent Vitals/Pain Pain Assessment: Faces Faces Pain Scale: No hurt    Home Living                      Prior Function            PT Goals (current goals can now be found in the care plan section) Progress towards PT goals: Progressing toward goals    Frequency    Min 2X/week      PT Plan Discharge plan needs to be updated     Co-evaluation              AM-PAC PT "6 Clicks" Mobility   Outcome Measure  Help needed turning from your back to your side while in a flat bed without using bedrails?: A Little Help needed moving from lying on your back to sitting on the side of a flat bed without using bedrails?: A Little Help needed moving to and from a bed to a chair (including a wheelchair)?: A Little Help needed standing up from a chair using your arms (e.g., wheelchair or bedside chair)?: A Little Help needed to walk in hospital room?: A Little Help needed climbing 3-5 steps with a railing? : A Lot 6 Click Score: 17    End of Session Equipment Utilized During Treatment: Gait belt Activity Tolerance: Patient tolerated treatment well Patient left: in chair;with family/visitor present;with call bell/phone within reach;with chair alarm set Nurse Communication: Mobility status PT Visit Diagnosis: Other abnormalities of gait and mobility (R26.89);Muscle weakness (generalized) (M62.81)     Time: [id number] PT Time Calculation (min) (ACUTE ONLY): 16 min  Charges:  $Therapeutic Exercise: 8-22 mins                    Lieutenant Diego PT, DPT 3:32 PM,10/05/20

## 2020-10-05 NOTE — TOC Progression Note (Signed)
Transition of Care [facility]) - Progression Note    Patient Details  Name: Sheryl Suarez MRN: [id number] Date of Birth: 1923/04/24  Transition of Care [facility]) CM/SW Contact  Shelbie Hutching, RN Phone Number: 10/05/2020, 10:01 AM  Clinical Narrative:    RNCM has spoken with Lattie Haw at [facility] and left a message with Tiffany at [facility].  Lattie Haw will see about getting someone out to assess patient for return to facility, hopefully today.     Expected Discharge Plan: Assisted Living Barriers to Discharge: Continued Medical Work up  Expected Discharge Plan and Services Expected Discharge Plan: Assisted Living       Living arrangements for the past 2 months: Assisted Living Facility                                       Social Determinants of Health (SDOH) Interventions    Readmission Risk Interventions No flowsheet data found.

## 2020-10-06 LAB — CULTURE, BLOOD (ROUTINE X 2)
Culture: NO GROWTH
Culture: NO GROWTH
Special Requests: ADEQUATE
Special Requests: ADEQUATE

## 2020-10-06 LAB — BASIC METABOLIC PANEL
Anion gap: 9 (ref 5–15)
BUN: 26 mg/dL — ABNORMAL HIGH (ref 8–23)
CO2: 28 mmol/L (ref 22–32)
Calcium: 8.7 mg/dL — ABNORMAL LOW (ref 8.9–10.3)
Chloride: 94 mmol/L — ABNORMAL LOW (ref 98–111)
Creatinine, Ser: 1.15 mg/dL — ABNORMAL HIGH (ref 0.44–1.00)
GFR, Estimated: 44 mL/min — ABNORMAL LOW (ref >60)
Glucose, Bld: 88 mg/dL (ref 70–99)
Potassium: 3.4 mmol/L — ABNORMAL LOW (ref 3.5–5.1)
Sodium: 131 mmol/L — ABNORMAL LOW (ref 135–145)

## 2020-10-06 LAB — RESP PANEL BY RT-PCR (FLU A&B, COVID) ARPGX2
Influenza A by PCR: NEGATIVE
Influenza B by PCR: NEGATIVE
SARS Coronavirus 2 by RT PCR: NEGATIVE

## 2020-10-06 MED ORDER — POTASSIUM CHLORIDE CRYS ER 20 MEQ PO TBCR: 20.0000 meq | EXTENDED_RELEASE_TABLET | Freq: Once | ORAL | Status: DC

## 2020-10-06 MED ORDER — ATORVASTATIN CALCIUM 10 MG PO TABS: 10.0000 mg | ORAL_TABLET | Freq: Every day | ORAL | Status: AC

## 2020-10-06 NOTE — TOC Transition Note (Signed)
Transition of Care [facility]) - CM/SW Discharge Note   Patient Details  Name: ZHARI VELEY MRN: [id number] Date of Birth: [birth year]  Transition of Care [facility]) CM/SW Contact:  Shelbie Hutching, RN Phone Number: 10/06/2020, 12:37 PM   Clinical Narrative:    [facility] unable to offer a bed but [facility] offered.  Daughter, [name] accepts bed offer for [facility].  Patient is medically cleared for discharge today.  [facility] will go to [facility] room 804, bedside RN will call report.  Repeat COVID test needed before she goes.  Once COVID results RNCM will arrange EMS transport.     Final next level of care: Skilled Nursing Facility Barriers to Discharge: Barriers Resolved   Patient Goals and CMS Choice Patient states their goals for this hospitalization and ongoing recovery are:: Daughter agrees in SNF and hopes that patient can go back to [place] after CMS Medicare.gov Compare Post Acute Care list provided to:: Patient Represenative (must comment) Choice offered to / list presented to : Adult Children  Discharge Placement              Patient chooses bed at: [facility]  Patient to be transferred to facility by: [facility] Name of family member notified: [name] (daughter) Patient and family notified of of transfer: 10/06/20  Discharge Plan and Services                DME Arranged: N/A DME Agency: NA       HH Arranged: NA HH Agency: NA        Social Determinants of Health (SDOH) Interventions     Readmission Risk Interventions No flowsheet data found.

## 2020-10-06 NOTE — Progress Notes (Addendum)
Physical Therapy Treatment Patient Details Name: Sheryl Suarez MRN: [id number] DOB: [birth year] Today's Date: 10/06/2020    History of Present Illness PT is an 87 y.o. 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 PMH: atrial fibrillation not on anticoagulation, CHF, CAD, HTN, and history of breast cancer now in remission.    PT Comments    In recliner, agrees to gait.  Stand with min guard without promoting or with walker nearby.  She resists sitting but is generally stable holding onto windowsill.  Gilford Rile is obtained and she is able to walk 7' before cued to turn back to room.  She becomes fatigued upon return trip but is able to make it to recliner safely.  Sats do decrease briefly to 89 and audible wheezing is noted.  Sats returning to low 90's with time. She denied distress but endorses fatigue.  RN called to make her aware of respirations/sounding "tight".  Pt remained in recliner with needs met.   Follow Up Recommendations  Supervision for mobility/OOB;SNF     Equipment Recommendations  None recommended by PT    Recommendations for Other Services       Precautions / Restrictions Precautions Precautions: Fall Restrictions Weight Bearing Restrictions: No    Mobility  Bed Mobility               General bed mobility comments: seated in recliner chair    Transfers Overall transfer level: Needs assistance Equipment used: Rolling walker (2 wheeled) Transfers: Sit to/from Stand              Ambulation/Gait Ambulation/Gait assistance: Min guard Gait Distance (Feet): 140 Feet Assistive device:  Rolling walker (2 wheeled) Gait Pattern/deviations: Step-through pattern Gait velocity: Moderate   General Gait Details: poor awareness of fatigue level and gait limitations   Stairs             Wheelchair Mobility    Modified Rankin (Stroke Patients Only)       Balance Overall balance assessment: Needs assistance Sitting-balance support: Feet supported;No upper extremity supported Sitting balance-Leahy Scale: Fair     Standing balance support: During functional activity;Bilateral upper extremity supported Standing balance-Leahy Scale: Fair                              Cognition Arousal/Alertness: Awake/alert Behavior During Therapy: WFL for tasks assessed/performed;Flat affect Overall Cognitive Status: History of cognitive impairments - at baseline                                        Exercises      General Comments        Pertinent Vitals/Pain Pain Assessment: No/denies pain    Home Living                      Prior Function            PT Goals (current goals can now be found in the care plan section) Progress towards  PT goals: Progressing toward goals    Frequency    Min 2X/week      PT Plan Discharge plan needs to be updated    Co-evaluation              AM-PAC PT "6 Clicks" Mobility   Outcome Measure  Help needed turning from your back to your side while in a flat bed without using bedrails?: A Little Help needed moving from lying on your back to sitting on the side of a flat bed without using bedrails?: A Little Help needed moving to and from a bed to a chair (including a wheelchair)?: A Little Help needed standing up from a chair using your arms (e.g., wheelchair or bedside chair)?: A Little Help needed to walk in hospital room?: A Little Help needed climbing 3-5 steps with a railing? : A Lot 6 Click Score: 17    End of Session Equipment Utilized During Treatment: Gait belt Activity  Tolerance: Patient tolerated treatment well Patient left: in chair;with family/visitor present;with call bell/phone within reach;with chair alarm set Nurse Communication: Mobility status PT Visit Diagnosis: Other abnormalities of gait and mobility (R26.89);Muscle weakness (generalized) (M62.81)     Time: 1052-1100 PT Time Calculation (min) (ACUTE ONLY): 8 min  Charges:  $Gait Training: 8-22 mins                    Chesley Noon, PTA 10/06/20, 11:08 AM 10/06/2020, 11:05 AM

## 2020-10-06 NOTE — Progress Notes (Signed)
Two attempted calls made to facility to provide report, when called back operated says the call keeps getting dropped.   Volunteer services via wheelchair transported patient to front to daughter that will transport her to facility in place of waiting for EMS transportation.   PIV removed per order. Discharge packet with DNR provided to daughter.

## 2020-10-06 NOTE — Discharge Summary (Signed)
Physician Discharge Summary  Sheryl Suarez [id number] DOB: [birth year] DOA: 10/01/2020  PCP: Leonel Ramsay, MD  Admit date: 10/01/2020 Discharge date: 10/06/2020  Admitted From: ALF Disposition: SNF  Recommendations for Outpatient Follow-up:  Follow up with PCP in 1-2 weeks Follow-up with cardiology Follow-up with neurology Please obtain BMP/CBC in one week Please follow up on the following pending results: None  Home Health: No Equipment/Devices: Rolling walker Discharge Condition: Stable CODE STATUS: DNR Diet recommendation: Heart Healthy   Brief/Interim Summary:  Sheryl Suarez is a 87 y.o. female with medical history significant for embolic CVA, chronic atrial fibrillation, chronic diastolic heart failure, CAD, history of GI bleed and hypertension who presents with concerns of altered mental status.   Daughter who is pt's HCPOA at bedside provides history.  Patient lives in [facility] assisted living facility and was sent over today after she was noted to be in the closet yelling and spelling out words.  At baseline she is able to recognize self, family and year. She was recently evaluated in the ED on 8/8 for altered mental status as well and discharged with Keflex for UTI.  Culture resulted with E. coli.  Patient completed the course of antibiotics.  CT head was negative but MRI with then acute infarct in right MCA territory and an expansion of prior left MCA infarct.  Most likely secondary to her A. fib as she was not on any anticoagulation. Patient underwent complete stroke work-up in May.  Neurology was consulted and they do not recommend repeating the whole work-up.  Plavix can be added but that will increase her chance of bleeding.  She is not a candidate for anticoagulation due to recent GI bleed and multiple falls. Per daughter patient has declining health since had a stroke in May and a mechanical fall in July. Patient will remain high risk for further stroke because  of her atrial fibrillation and unable to take any anticoagulation.  Family understands it and would like to have more focus on comfort and only treat the treatables.  Daughter is concerned that she might not be able to live in an assisted living facility and will need more assistance.  PT/OT is recommending SNF.  Patient has an history of chronic diastolic heart failure.  She appears dry initially so we held her home dose of Lasix initially and which can be resumed on discharge.  She can continue rest of her home medications and follow-up with her providers.  Discharge Diagnoses:  Principal Problem:   AMS (altered mental status) Active Problems:   HTN (hypertension), benign   CAD (coronary artery disease)   Atrial fibrillation, chronic (HCC)   Acute lower UTI   History of CVA (cerebrovascular accident)   AKI (acute kidney injury) ([place])   Hyponatremia   Chronic diastolic CHF (congestive heart failure) ([place])   Sacral pressure ulcer   Discharge Instructions  Discharge Instructions     Diet - low sodium heart healthy   Complete by: As directed    Increase activity slowly   Complete by: As directed    No dressing needed   Complete by: As directed       Allergies as of 10/06/2020   No Known Allergies      Medication List     STOP taking these medications    cephALEXin 500 MG capsule Commonly known as: KEFLEX       TAKE these medications    acetaminophen 325 MG tablet Commonly known as: TYLENOL Take 2  tablets (650 mg total) by mouth every 4 (four) hours as needed for mild pain (or temp > 37.5 C (99.5 F)).   aspirin 81 MG EC tablet Take 1 tablet (81 mg total) by mouth daily. Swallow whole.   atorvastatin 10 MG tablet Commonly known as: Lipitor Take 1 tablet (10 mg total) by mouth daily.   cetaphil cream Apply 1 application topically daily. (Apply to bilateral legs)   diltiazem 120 MG 24 hr capsule Commonly known as: CARDIZEM CD Take 120 mg by mouth  daily.   furosemide 20 MG tablet Commonly known as: LASIX Take 10 mg by mouth daily.   lidocaine 5 % Commonly known as: LIDODERM Place 1 patch onto the skin daily. (Apply to lower back and remove after 12 hours)   lisinopril 5 MG tablet Commonly known as: ZESTRIL Take 5 mg by mouth daily.   ondansetron 4 MG disintegrating tablet Commonly known as: ZOFRAN-ODT Take 1 tablet (4 mg total) by mouth every 6 (six) hours as needed for nausea.   polyvinyl alcohol 1.4 % ophthalmic solution Commonly known as: LIQUIFILM TEARS Place 1 drop into both eyes 4 (four) times daily as needed for dry eyes.   senna-docusate 8.6-50 MG tablet Commonly known as: Senokot-S Take 1 tablet by mouth at bedtime as needed for mild constipation.   zinc oxide 20 % ointment Apply 1 application topically 3 (three) times daily. (Apply to sacrum)               Discharge Care Instructions  (From admission, onward)           Start     Ordered   10/06/20 0000  No dressing needed        10/06/20 1159            Contact information for follow-up providers     Leonel Ramsay, MD. Schedule an appointment as soon as possible for a visit in 1 week(s).   Specialty: Infectious Diseases Contact information: [place] [address] [phone]              Contact information for after-discharge care     Destination     [facility] Preferred SNF .   Service: Skilled Nursing Contact information: [address] [place] [phone]                    No Known Allergies  Consultations: Neurology  Procedures/Studies: CT Head Wo Contrast  Result Date: 10/01/2020 CLINICAL DATA:  Mental status change EXAM: CT HEAD WITHOUT CONTRAST TECHNIQUE: Contiguous axial images were obtained from the base of the skull through the vertex without intravenous contrast. COMPARISON:  CT brain 09/07/2020 FINDINGS: Brain: No  acute territorial infarction, hemorrhage or intracranial mass. Atrophy and chronic small vessel ischemic changes of the white matter. Chronic left MCA infarct. Stable ventricle size. Vascular: No hyperdense vessels.  Carotid vascular calcification Skull: Normal. Negative for fracture or focal lesion. Sinuses/Orbits: No acute finding. Other: None IMPRESSION: 1. No CT evidence for acute intracranial abnormality. 2. Chronic left MCA infarct. Atrophy and chronic small vessel ischemic changes of the white matter. Electronically Signed   By: Donavan Foil M.D.   On: 10/01/2020 17:59   CT Head Wo Contrast  Result Date: 09/07/2020 CLINICAL DATA:  Found down, scalp hematoma EXAM: CT HEAD WITHOUT CONTRAST TECHNIQUE: Contiguous axial images were obtained from the base of the skull through the vertex without intravenous contrast. COMPARISON:  07/11/2020, 07/12/2020 FINDINGS: Brain: There has been evolution of the left MCA infarct seen on previous exams. Chronic small vessel ischemic changes are again noted throughout the white matter. No acute infarct or hemorrhage. Lateral ventricles and midline structures are unremarkable. No acute extra-axial fluid collections. No mass effect. Vascular: No hyperdense vessel or unexpected calcification. Skull: There are bilateral frontal scalp hematomas. No underlying fracture. Remainder of the calvarium is unremarkable. Sinuses/Orbits: No acute finding. Other: None. IMPRESSION: 1. Bilateral frontal scalp hematomas. Otherwise no acute intracranial process. 2. Evolution of the left MCA territory infarct seen on previous exam. Stable chronic small-vessel ischemic changes elsewhere within the white matter. Electronically Signed   By: Randa Ngo M.D.   On: 09/07/2020 22:49   CT Cervical Spine Wo Contrast  Result Date: 09/07/2020 CLINICAL DATA:  Found down, scalp hematoma EXAM: CT CERVICAL SPINE WITHOUT CONTRAST TECHNIQUE: Multidetector CT imaging of the cervical spine was performed  without intravenous contrast. Multiplanar CT image reconstructions were also generated. COMPARISON:  [place] 2420 FINDINGS: Alignment: Alignment is grossly anatomic. Skull base and vertebrae: No acute fracture. No primary bone lesion or focal pathologic process. Soft tissues and spinal canal: No prevertebral fluid or swelling. No visible canal hematoma. Stable large pannus at the C1-C2 interface with resulting central canal stenosis again noted. Disc levels: Stable extensive multilevel cervical spondylosis and facet hypertrophy unchanged. Left predominant neural foraminal encroachment throughout the cervical spine, with multilevel central canal stenosis greatest at C4-5 and C5-6. Upper chest: Airway is patent.  Lung apices are clear. Other: Reconstructed images demonstrate no additional findings. IMPRESSION: 1. No acute cervical spine fracture. 2. Stable extensive cervical spondylosis and facet hypertrophy. Electronically Signed   By: Randa Ngo M.D.   On: 09/07/2020 22:53   MR Brain W and Wo Contrast  Result Date: 10/02/2020 CLINICAL DATA:  87 year old female with unexplained altered mental status. Status post left MCA infarct in May. EXAM: MRI HEAD WITHOUT AND WITH CONTRAST TECHNIQUE: Multiplanar, multiecho pulse sequences of the brain and surrounding structures were obtained without and with intravenous contrast. CONTRAST:  20m GADAVIST GADOBUTROL 1 MMOL/ML IV SOLN COMPARISON:  Noncontrast head CT yesterday. Brain MRI 07/12/2020. FINDINGS: Brain: Patchy new restricted diffusion in the anterior right frontal lobe, anterior right frontal operculum (series 9, image 28) is associated with confluent new T2 and FLAIR hyperintensity there extending to the anterior operculum cortex (series 16, image 14) and post ischemic gyriform enhancement (series 19, image 15). No associated hemorrhage or mass effect at that site. Indistinct patchy diffusion restriction also in the left posterior corona radiata underlying the  dominant area of infarct seen in May (series 9, image 29 today). Associated increased T2 and FLAIR hyperintensity there. Developing encephalomalacia in the nearby left posterior operculum which demonstrates mild laminar necrosis (intrinsic T1 signal seen on series 17, image 18 +/-mild associated gyral enhancement. No other restricted diffusion. No midline shift, mass effect, evidence of mass lesion, ventriculomegaly, extra-axial collection or acute intracranial hemorrhage. Pituitary within normal limits. Underlying confluent bilateral cerebral white matter T2 and FLAIR hyperintensity. No chronic cerebral blood products or other cortical encephalomalacia identified. Comparatively mild T2 heterogeneity in the bilateral deep gray matter nuclei. Brainstem and cerebellum are largely normal for age. No other abnormal intracranial enhancement. No definite dural thickening. Vascular: Major intracranial vascular flow voids appear stable since May. Skull and upper cervical spine: Degenerative cervicomedullary junction stenosis redemonstrated related to bulky soft tissue pannus and/or ligamentous hypertrophy about the odontoid (series 13, image 13 today). No obvious cervicomedullary  junction signal abnormality. Background bone marrow signal remains normal. Sinuses/Orbits: Stable orbits. Paranasal Visualized paranasal sinuses and mastoids are clear. Other: Grossly normal visible internal auditory structures. Negative scalp and face soft tissues. IMPRESSION: 1. Acute Right MCA territory infarct affecting the anterior operculum. No associated hemorrhage or mass effect. 2. Evolution of the Left MCA infarct since May but with acute or subacute patchy extension of ischemia there into the posterior left corona radiata. No associated hemorrhage or mass effect. 3. No other acute intracranial abnormality. Advanced chronic white matter disease. Chronic cervicomedullary junction stenosis related to bulky soft tissue pannus about the  odontoid. Electronically Signed   By: Genevie Ann M.D.   On: 10/02/2020 04:38   DG Chest Portable 1 View  Result Date: 09/07/2020 CLINICAL DATA:  Pulmonary crackles on examination EXAM: PORTABLE CHEST 1 VIEW COMPARISON:  07/16/2020 FINDINGS: There is a nodular density seen at the a left costophrenic angle which may represent callus formation adjacent to a subacute fracture of the left ninth rib posterolaterally. The lungs are otherwise clear. No pneumothorax or pleural effusion. Cardiac size is mildly enlarged, unchanged. Mild central pulmonary vascular congestion without overt pulmonary edema appears stable. IMPRESSION: Stable cardiomegaly and central pulmonary venous vascular congestion without overt pulmonary edema. Subacute fracture of the left ninth rib. Adjacent pulmonary nodule may represent callus related to the fracture. A follow-up chest radiograph in 3 months would be helpful in documenting stability and excluding an alternative etiology of the underlying pulmonary nodule. Alternatively, this could be further assessed with dedicated CT imaging if indicated. Electronically Signed   By: Fidela Salisbury MD   On: 09/07/2020 22:13    Subjective: Patient was seen and examined today.  No new complaints.  Discharge Exam: Vitals:   10/06/20 0438 10/06/20 0837  BP: 131/66 (!) 151/70  Pulse: (!) 58 73  Resp: 16 16  Temp: (!) 97.4 F (36.3 C) 97.9 F (36.6 C)  SpO2: 99% 100%   Vitals:   10/05/20 1938 10/05/20 2316 10/06/20 0438 10/06/20 0837  BP: (!) 176/93 (!) 149/58 131/66 (!) 151/70  Pulse: 83 69 (!) 58 73  Resp: '16 14 16 16  '$ Temp: 97.8 F (36.6 C) 97.6 F (36.4 C) (!) 97.4 F (36.3 C) 97.9 F (36.6 C)  TempSrc: Oral Oral    SpO2: 97% 98% 99% 100%  Weight:      Height:        General: Pt is alert, awake, not in acute distress Cardiovascular: RRR, S1/S2 +, no rubs, no gallops Respiratory: CTA bilaterally, no wheezing, no rhonchi Abdominal: Soft, NT, ND, bowel sounds + Extremities:  no edema, no cyanosis   The results of significant diagnostics from this hospitalization (including imaging, microbiology, ancillary and laboratory) are listed below for reference.    Microbiology: Recent Results (from the past 240 hour(s))  Urine Culture     Status: Abnormal   Collection Time: 09/28/20  9:45 PM   Specimen: Urine, Clean Catch  Result Value Ref Range Status   Specimen Description   Final    URINE, CLEAN CATCH Performed at [facility], [address], [place], [place] [address]    Special Requests   Final    NONE Performed at [facility], [address] [place], [place] [address]    Culture >=100,000 COLONIES/mL ESCHERICHIA COLI (A)  Final   Report Status 10/01/2020 FINAL  Final   Organism ID, Bacteria ESCHERICHIA COLI (A)  Final      Susceptibility   Escherichia  coli - MIC*    AMPICILLIN 16 INTERMEDIATE Intermediate     CEFAZOLIN <=4 SENSITIVE Sensitive     CEFEPIME <=0.12 SENSITIVE Sensitive     CEFTRIAXONE <=0.25 SENSITIVE Sensitive     CIPROFLOXACIN >=4 RESISTANT Resistant     GENTAMICIN <=1 SENSITIVE Sensitive     IMIPENEM <=0.25 SENSITIVE Sensitive     NITROFURANTOIN <=16 SENSITIVE Sensitive     TRIMETH/SULFA >=320 RESISTANT Resistant     AMPICILLIN/SULBACTAM 8 SENSITIVE Sensitive     PIP/TAZO <=4 SENSITIVE Sensitive     * >=100,000 COLONIES/mL ESCHERICHIA COLI  Culture, blood (routine x 2)     Status: None   Collection Time: 10/01/20  7:30 PM   Specimen: BLOOD  Result Value Ref Range Status   Specimen Description BLOOD RIGHT ANTECUBITAL  Final   Special Requests   Final    BOTTLES DRAWN AEROBIC AND ANAEROBIC Blood Culture adequate volume   Culture   Final    NO GROWTH 5 DAYS Performed at [facility], [address] [place], [place] [address]    Report Status 10/06/2020 FINAL  Final  SARS CORONAVIRUS 2 (TAT 6-24 HRS) Nasopharyngeal Nasopharyngeal Swab     Status: None   Collection Time: 10/01/20  7:32 PM    Specimen: Nasopharyngeal Swab  Result Value Ref Range Status   SARS Coronavirus 2 NEGATIVE NEGATIVE Final    Comment: (NOTE) SARS-CoV-2 target nucleic acids are NOT DETECTED.  The SARS-CoV-2 RNA is generally detectable in upper and lower respiratory specimens during the acute phase of infection. Negative results do not preclude SARS-CoV-2 infection, do not rule out co-infections with other pathogens, and should not be used as the sole basis for treatment or other patient management decisions. Negative results must be combined with clinical observations, patient history, and epidemiological information. The expected result is Negative.  Fact Sheet for Patients: SugarRoll.be  Fact Sheet for Healthcare Providers: https://www.woods-mathews.com/  This test is not yet approved or cleared by the [place] FDA and  has been authorized for detection and/or diagnosis of SARS-CoV-2 by FDA under an Emergency Use Authorization (EUA). This EUA will remain  in effect (meaning this test can be used) for the duration of the COVID-19 declaration under Se ction 564(b)(1) of the Act, 21 U.S.C. section 360bbb-3(b)(1), unless the authorization is terminated or revoked sooner.  Performed at [facility], [place] [address], [place], [place]  [address]   Culture, blood (routine x 2)     Status: None   Collection Time: 10/01/20  8:32 PM   Specimen: BLOOD  Result Value Ref Range Status   Specimen Description BLOOD BLOOD RIGHT HAND  Final   Special Requests   Final    BOTTLES DRAWN AEROBIC AND ANAEROBIC Blood Culture adequate volume   Culture   Final    NO GROWTH 5 DAYS Performed at [facility], [address] [place], [place] [address]    Report Status 10/06/2020 FINAL  Final     Labs: BNP (last 3 results) Recent Labs    09/07/20 2157 10/01/20 1717  BNP 703.6* 0000000*   Basic Metabolic Panel: Recent Labs  Lab 10/01/20 1718  10/02/20 0606 10/03/20 0927 10/06/20 0431  NA 129* 132* 132* 131*  K 4.0 3.5 4.1 3.4*  CL 90* 94* 94* 94*  CO2 '29 29 26 28  '$ GLUCOSE 108* 95 125* 88  BUN 28* 24* 25* 26*  CREATININE 1.55* 1.32* 1.36* 1.15*  CALCIUM 9.5 9.0 9.5 8.7*   Liver Function  Tests: Recent Labs  Lab 10/01/20 1718  AST 15  ALT 10  ALKPHOS 99  BILITOT 0.8  PROT 7.5  ALBUMIN 3.3*   No results for input(s): LIPASE, AMYLASE in the last 168 hours. No results for input(s): AMMONIA in the last 168 hours. CBC: Recent Labs  Lab 10/01/20 1718  WBC 17.2*  NEUTROABS 14.4*  HGB 13.2  HCT 38.9  MCV 86.4  PLT 258   Cardiac Enzymes: No results for input(s): CKTOTAL, CKMB, CKMBINDEX, TROPONINI in the last 168 hours. BNP: Invalid input(s): POCBNP CBG: No results for input(s): GLUCAP in the last 168 hours. D-Dimer No results for input(s): DDIMER in the last 72 hours. Hgb A1c No results for input(s): HGBA1C in the last 72 hours. Lipid Profile No results for input(s): CHOL, HDL, LDLCALC, TRIG, CHOLHDL, LDLDIRECT in the last 72 hours. Thyroid function studies No results for input(s): TSH, T4TOTAL, T3FREE, THYROIDAB in the last 72 hours.  Invalid input(s): FREET3 Anemia work up No results for input(s): VITAMINB12, FOLATE, FERRITIN, TIBC, IRON, RETICCTPCT in the last 72 hours. Urinalysis    Component Value Date/Time   COLORURINE YELLOW (A) 10/01/2020 1717   APPEARANCEUR HAZY (A) 10/01/2020 1717   LABSPEC 1.014 10/01/2020 1717   PHURINE 5.0 10/01/2020 1717   GLUCOSEU NEGATIVE 10/01/2020 1717   HGBUR NEGATIVE 10/01/2020 1717   BILIRUBINUR NEGATIVE 10/01/2020 1717   KETONESUR NEGATIVE 10/01/2020 1717   PROTEINUR 100 (A) 10/01/2020 1717   NITRITE NEGATIVE 10/01/2020 1717   LEUKOCYTESUR SMALL (A) 10/01/2020 1717   Sepsis Labs Invalid input(s): PROCALCITONIN,  WBC,  LACTICIDVEN Microbiology Recent Results (from the past 240 hour(s))  Urine Culture     Status: Abnormal   Collection Time: 09/28/20  9:45  PM   Specimen: Urine, Clean Catch  Result Value Ref Range Status   Specimen Description   Final    URINE, CLEAN CATCH Performed at [facility], [address] [place], [place] [address]    Special Requests   Final    NONE Performed at [facility], [address] [place], [place] [address]    Culture >=100,000 COLONIES/mL ESCHERICHIA COLI (A)  Final   Report Status 10/01/2020 FINAL  Final   Organism ID, Bacteria ESCHERICHIA COLI (A)  Final      Susceptibility   Escherichia coli - MIC*    AMPICILLIN 16 INTERMEDIATE Intermediate     CEFAZOLIN <=4 SENSITIVE Sensitive     CEFEPIME <=0.12 SENSITIVE Sensitive     CEFTRIAXONE <=0.25 SENSITIVE Sensitive     CIPROFLOXACIN >=4 RESISTANT Resistant     GENTAMICIN <=1 SENSITIVE Sensitive     IMIPENEM <=0.25 SENSITIVE Sensitive     NITROFURANTOIN <=16 SENSITIVE Sensitive     TRIMETH/SULFA >=320 RESISTANT Resistant     AMPICILLIN/SULBACTAM 8 SENSITIVE Sensitive     PIP/TAZO <=4 SENSITIVE Sensitive     * >=100,000 COLONIES/mL ESCHERICHIA COLI  Culture, blood (routine x 2)     Status: None   Collection Time: 10/01/20  7:30 PM   Specimen: BLOOD  Result Value Ref Range Status   Specimen Description BLOOD RIGHT ANTECUBITAL  Final   Special Requests   Final    BOTTLES DRAWN AEROBIC AND ANAEROBIC Blood Culture adequate volume   Culture   Final    NO GROWTH 5 DAYS Performed at [facility], [address] [place], [place] [address]    Report Status 10/06/2020 FINAL  Final  SARS CORONAVIRUS 2 (TAT 6-24 HRS) Nasopharyngeal Nasopharyngeal Swab  Status: None   Collection Time: 10/01/20  7:32 PM   Specimen: Nasopharyngeal Swab  Result Value Ref Range Status   SARS Coronavirus 2 NEGATIVE NEGATIVE Final    Comment: (NOTE) SARS-CoV-2 target nucleic acids are NOT DETECTED.  The SARS-CoV-2 RNA is generally detectable in upper and lower respiratory specimens during the acute phase of infection. Negative results do  not preclude SARS-CoV-2 infection, do not rule out co-infections with other pathogens, and should not be used as the sole basis for treatment or other patient management decisions. Negative results must be combined with clinical observations, patient history, and epidemiological information. The expected result is Negative.  Fact Sheet for Patients: SugarRoll.be  Fact Sheet for Healthcare Providers: https://www.woods-mathews.com/  This test is not yet approved or cleared by the [place] FDA and  has been authorized for detection and/or diagnosis of SARS-CoV-2 by FDA under an Emergency Use Authorization (EUA). This EUA will remain  in effect (meaning this test can be used) for the duration of the COVID-19 declaration under Se ction 564(b)(1) of the Act, 21 U.S.C. section 360bbb-3(b)(1), unless the authorization is terminated or revoked sooner.  Performed at [facility], [place] [address] [place], [place] [address]   Culture, blood (routine x 2)     Status: None   Collection Time: 10/01/20  8:32 PM   Specimen: BLOOD  Result Value Ref Range Status   Specimen Description BLOOD BLOOD RIGHT HAND  Final   Special Requests   Final    BOTTLES DRAWN AEROBIC AND ANAEROBIC Blood Culture adequate volume   Culture   Final    NO GROWTH 5 DAYS Performed at [facility], [address], [place], [place] [address]    Report Status 10/06/2020 FINAL  Final    Time coordinating discharge: Over 30 minutes  SIGNED:  Lorella Nimrod, MD  [facility] 10/06/2020, 12:01 PM  If 7PM-7AM, please contact night-coverage [email or web address]  This record has been created using Systems analyst. Errors have been sought and corrected,but may not always be located. Such creation errors do not reflect on the standard of care.

## 2020-10-06 NOTE — TOC Progression Note (Signed)
Transition of Care [facility]) - Progression Note    Patient Details  Name: Sheryl Suarez MRN: [id number] Date of Birth: 04-Jan-1924  Transition of Care [facility]) CM/SW Contact  Shelbie Hutching, RN Phone Number: 10/06/2020, 2:28 PM  Clinical Narrative:    Patient's COVID swab is negative.  RNCM has arranged transport with [facility], Patient is 9th in line for EMS transport.   Expected Discharge Plan: [place] Barriers to Discharge: Barriers Resolved  Expected Discharge Plan and Services Expected Discharge Plan: [place] arrangements for the past 2 months: [place] Expected Discharge Date: 10/03/20               DME Arranged: N/A DME Agency: NA       HH Arranged: NA HH Agency: NA         Social Determinants of Health (SDOH) Interventions    Readmission Risk Interventions No flowsheet data found.

## 2020-10-06 NOTE — TOC Progression Note (Signed)
Transition of Care [facility]) - Progression Note    Patient Details  Name: Sheryl Suarez MRN: [id number] Date of Birth: [birth year]  Transition of Care [facility]) CM/SW Contact  Shelbie Hutching, RN Phone Number: 10/06/2020, 2:41 PM  Clinical Narrative:    Patient's daughter will transport patient to facility.  EMS canceled.     Expected Discharge Plan: [place] Barriers to Discharge: Barriers Resolved  Expected Discharge Plan and Services Expected Discharge Plan: [place] arrangements for the past 2 months: [place] Expected Discharge Date: 10/03/20               DME Arranged: N/A DME Agency: NA       HH Arranged: NA HH Agency: NA         Social Determinants of Health (SDOH) Interventions    Readmission Risk Interventions No flowsheet data found.

## 2020-10-06 NOTE — Care Management Important Message (Signed)
Important Message  Patient Details  Name: Sheryl Suarez MRN: [id number] Date of Birth: 1923/05/01   Medicare Important Message Given:  Yes  I talked with the patients Sheryl Suarez Sheryl Suarez, daughter [phone]) and reviewed the Important Message from Medicare with her. She is in agreement with the discharge today.  I asked if she would like a copy of the form and she replied, no.  I wished her mother well and thanked her for her time.  [name] 10/06/2020, 2:10 PM

## 2020-10-24 ENCOUNTER — Other Ambulatory Visit: Payer: Self-pay

## 2020-10-24 ENCOUNTER — Emergency Department
Admission: EM | Admit: 2020-10-24 | Discharge: 2020-10-24 | Disposition: A | Discharge status: Home / Self Care | Payer: Medicare Other | Attending: Emergency Medicine | Admitting: Emergency Medicine

## 2020-10-24 ENCOUNTER — Encounter: Payer: Self-pay | Admitting: Physician Assistant

## 2020-10-24 ENCOUNTER — Emergency Department: Payer: Medicare Other

## 2020-10-24 DIAGNOSIS — S0181XA Laceration without foreign body of other part of head, initial encounter: Secondary | ICD-10-CM | POA: Insufficient documentation

## 2020-10-24 DIAGNOSIS — I2510 Atherosclerotic heart disease of native coronary artery without angina pectoris: Secondary | ICD-10-CM | POA: Diagnosis not present

## 2020-10-24 DIAGNOSIS — S0993XA Unspecified injury of face, initial encounter: Secondary | ICD-10-CM | POA: Diagnosis present

## 2020-10-24 DIAGNOSIS — Y92009 Unspecified place in unspecified non-institutional (private) residence as the place of occurrence of the external cause: Secondary | ICD-10-CM | POA: Insufficient documentation

## 2020-10-24 DIAGNOSIS — W07XXXA Fall from chair, initial encounter: Secondary | ICD-10-CM | POA: Insufficient documentation

## 2020-10-24 DIAGNOSIS — T148XXA Other injury of unspecified body region, initial encounter: Secondary | ICD-10-CM

## 2020-10-24 DIAGNOSIS — Z9013 Acquired absence of bilateral breasts and nipples: Secondary | ICD-10-CM | POA: Insufficient documentation

## 2020-10-24 DIAGNOSIS — Z7982 Long term (current) use of aspirin: Secondary | ICD-10-CM | POA: Diagnosis not present

## 2020-10-24 DIAGNOSIS — Z79899 Other long term (current) drug therapy: Secondary | ICD-10-CM | POA: Insufficient documentation

## 2020-10-24 DIAGNOSIS — Z853 Personal history of malignant neoplasm of breast: Secondary | ICD-10-CM | POA: Insufficient documentation

## 2020-10-24 DIAGNOSIS — S0083XA Contusion of other part of head, initial encounter: Secondary | ICD-10-CM

## 2020-10-24 DIAGNOSIS — I110 Hypertensive heart disease with heart failure: Secondary | ICD-10-CM | POA: Insufficient documentation

## 2020-10-24 DIAGNOSIS — I5032 Chronic diastolic (congestive) heart failure: Secondary | ICD-10-CM | POA: Insufficient documentation

## 2020-10-24 DIAGNOSIS — W19XXXA Unspecified fall, initial encounter: Secondary | ICD-10-CM

## 2020-10-24 IMAGING — CT CT HEAD W/O CM
4 series · 15 of 47 positions shown, 17 images · non-contrast
Comparison: [DATE], [DATE]

CLINICAL DATA: Fall, head and neck injury, laceration to the
forehead, dementia

EXAM:
CT HEAD WITHOUT CONTRAST
CT MAXILLOFACIAL WITHOUT CONTRAST
CT CERVICAL SPINE WITHOUT CONTRAST
TECHNIQUE: Multidetector CT imaging of the head, cervical spine, and
maxillofacial structures were performed using the standard protocol
without intravenous contrast. Multiplanar CT image reconstructions
of the cervical spine and maxillofacial structures were also
generated.

[Series 2: head wo · axial · 0.42mm/px · z∈[-129,-14]mm · 7 of 31 slices shown, 9 images]
[im 4/31  brain]
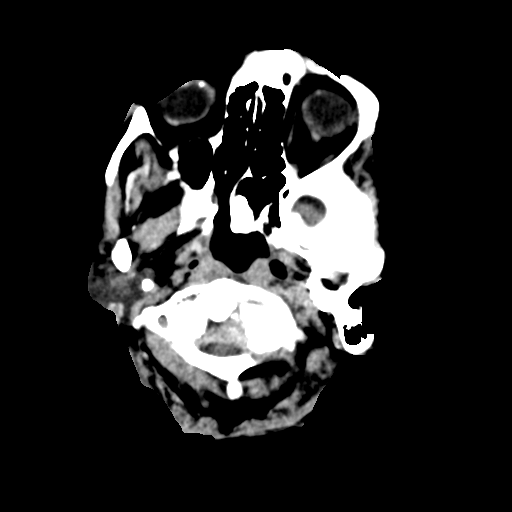
[im 4/31  bone]
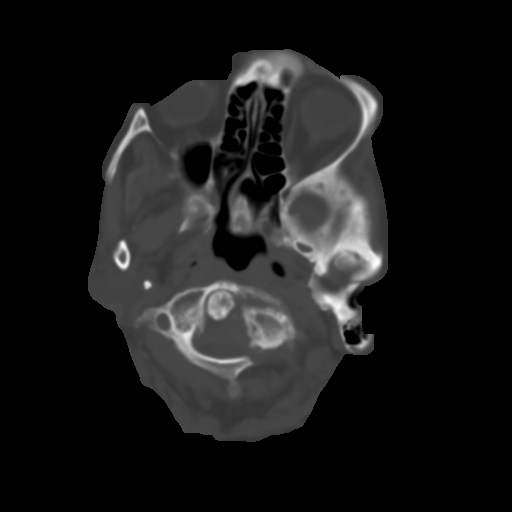
[im 8/31  brain]
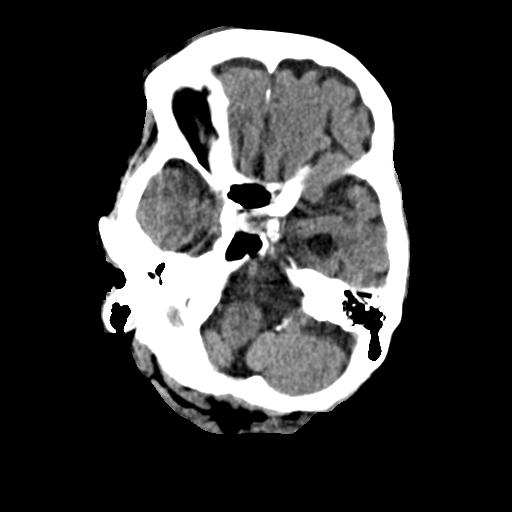
[im 12/31  brain]
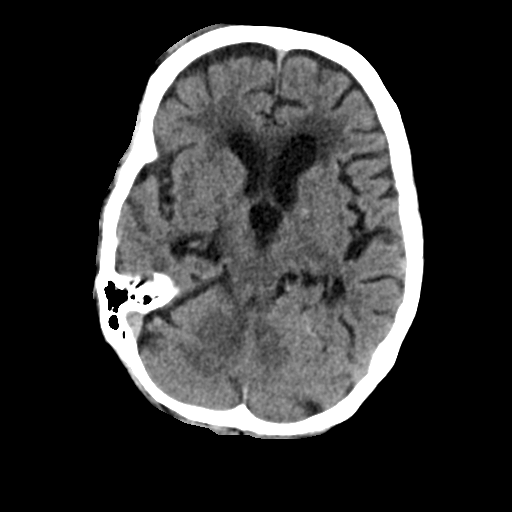
[im 16/31  brain]
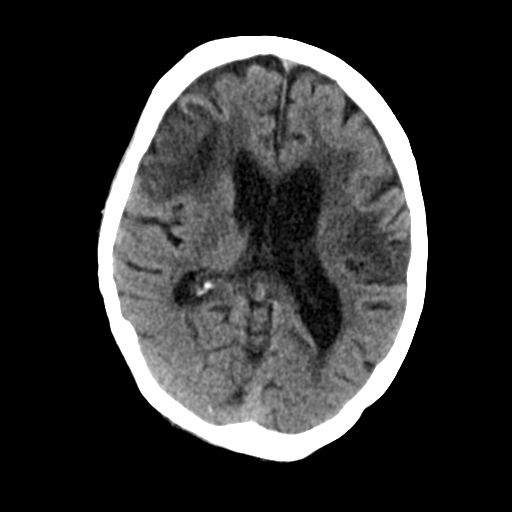
[im 19/31  brain]
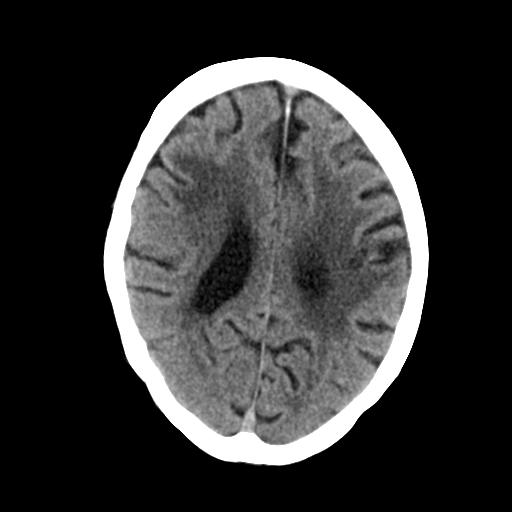
[im 19/31  bone]
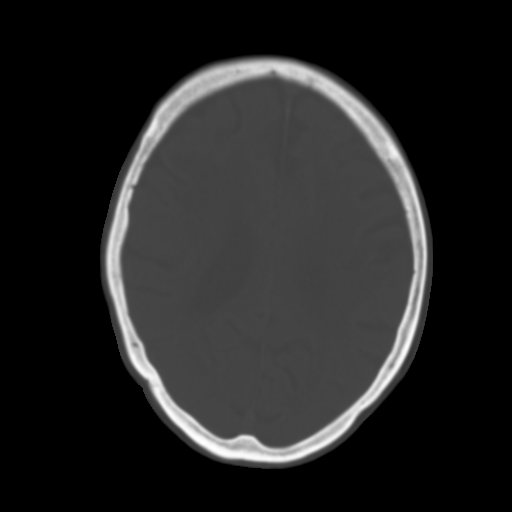
[im 23/31  brain]
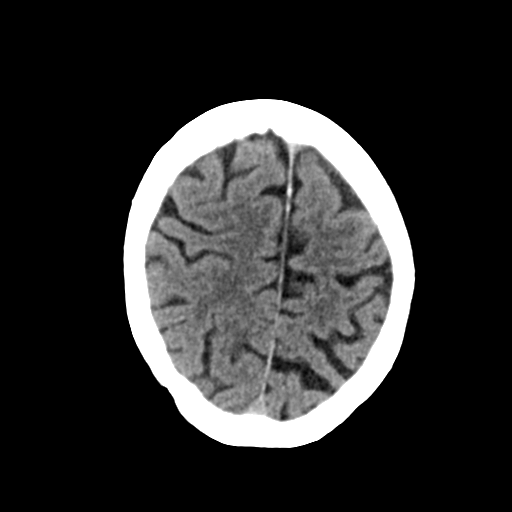
[im 27/31  brain]
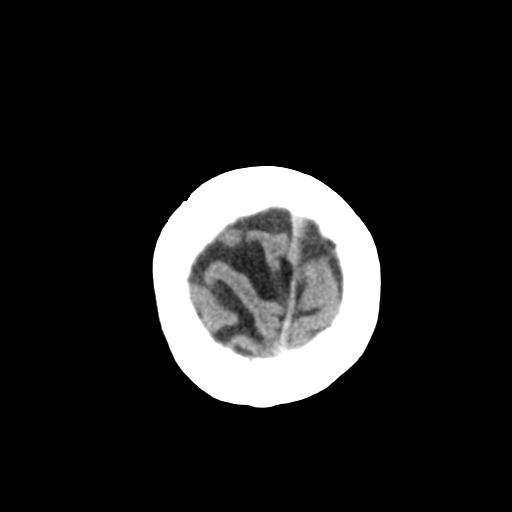

[Series 3: head bone · axial · 0.42mm/px · z∈[-130,-116]mm · 2 of 75 slices shown]
[im 8/75  bone]
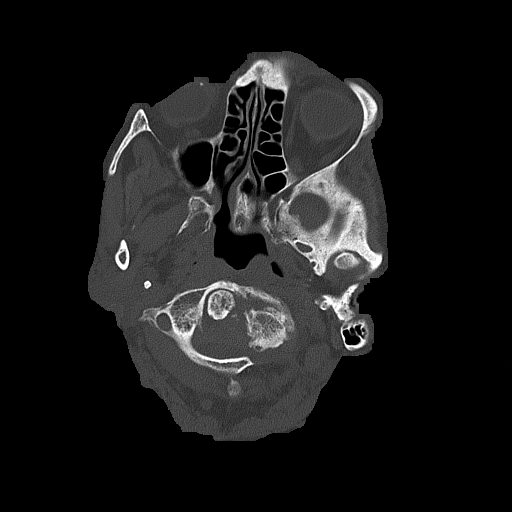
[im 15/75  bone]
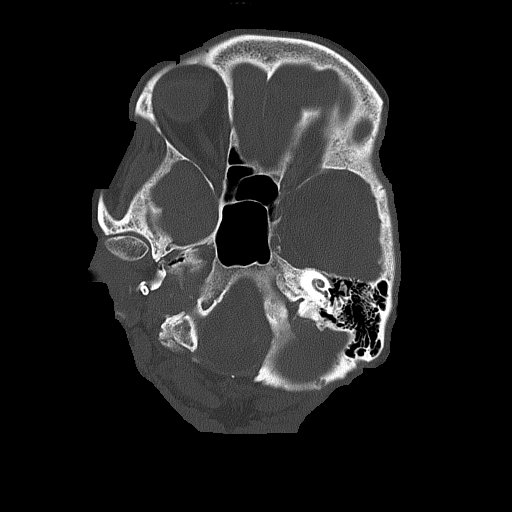

[Series 4: coronal soft tissue · coronal · 0.32mm/px · 3 of 66 slices shown]
[im 22/66  brain]
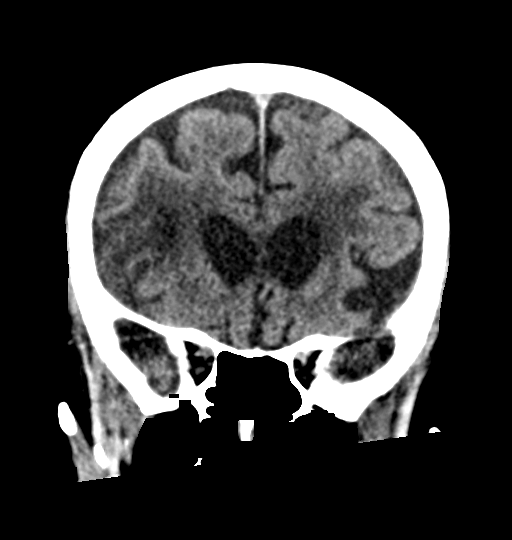
[im 29/66  brain]
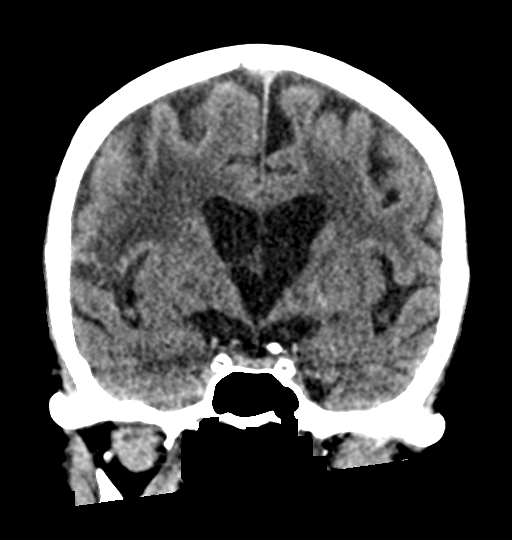
[im 37/66  brain]
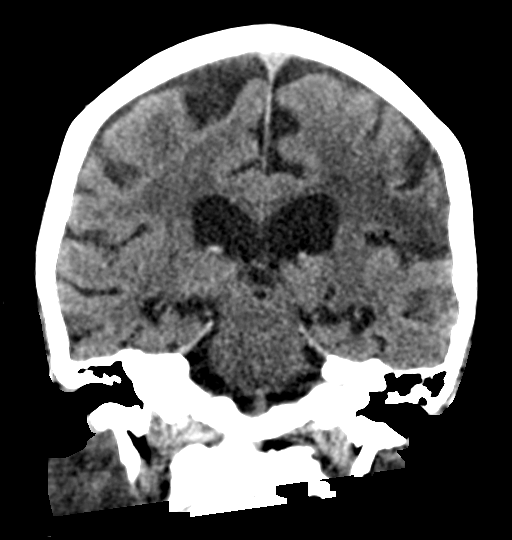

[Series 5: sagittal soft tissue · sagittal · 0.33mm/px · 3 of 54 slices shown]
[im 19/54  brain]
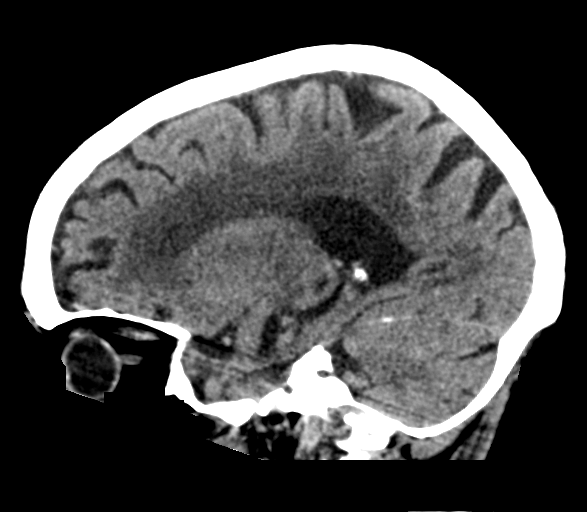
[im 27/54  brain]
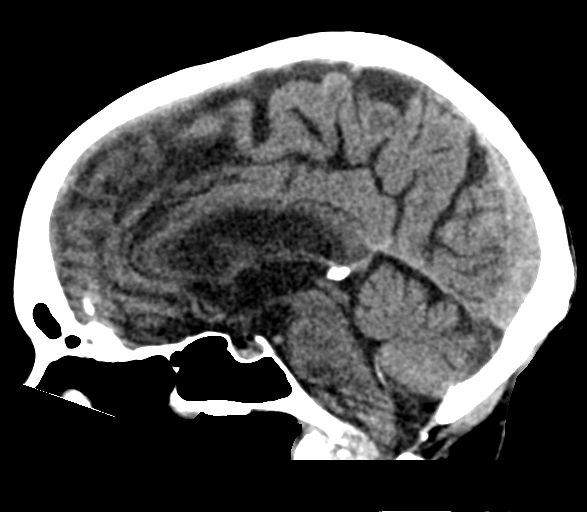
[im 35/54  brain]
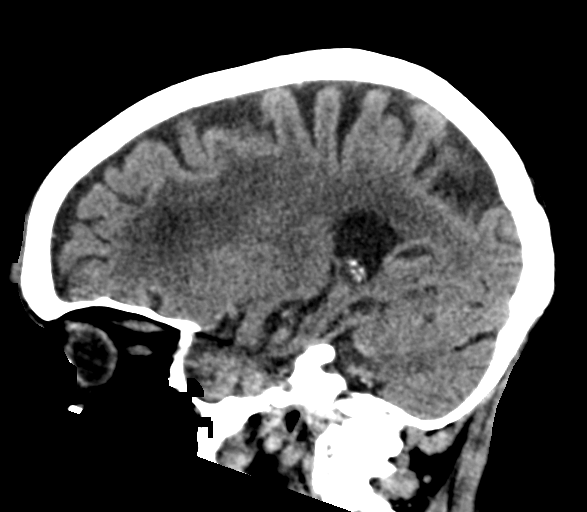

[15 of 47 positions shown; findings below may reference images not displayed]

FINDINGS: CT HEAD FINDINGS

Brain: Age-related atrophy and extensive white matter microvascular
ischemic changes throughout both cerebral hemispheres. Ill-defined
hypodensities in the right anterior frontal lobe and the left mid
frontal lobe compatible with known evolving bilateral MCA subacute
infarcts.

No acute intracranial hemorrhage. Stable ventricular enlargement. No
extra-axial fluid collection. No mass effect, edema, or herniation.
Cerebellar atrophy as well.

Vascular: Intracranial atherosclerosis noted.

Skull: Normal. Negative for fracture or focal lesion.

Other: Right anterior frontal scalp hematoma noted.

CT MAXILLOFACIAL FINDINGS

Osseous: No fracture or mandibular dislocation. No destructive
process.

Orbits: Negative. No traumatic or inflammatory finding.

Sinuses: Clear.

Soft tissues: Acute hyperdense right anterior frontal and superior
orbital scalp hematoma.

CT CERVICAL SPINE FINDINGS

Alignment: Mild dextrocurvature appears positional. No gross
malalignment.

Skull base and vertebrae: No acute fracture. No primary bone lesion
or focal pathologic process.

Soft tissues and spinal canal: No prevertebral fluid or swelling. No
visible canal hematoma.

Disc levels: Advanced multilevel cervical spondylosis and
degenerative change, most severe at C4 through C7. These levels
demonstrate marked disc space narrowing, sclerosis and endplate
osteophytes. Multilevel posterior facet arthropathy with ankylosis
of the facet joints at C2 through C5. Facet arthropathy is markedly
worse on the left. No acute subluxation or dislocation.

Upper chest: Minor apical scarring. Aortic and carotid
atherosclerosis.

Other: None.
IMPRESSION: Known evolving bilateral frontal subacute MCA territory infarcts.

Chronic atrophy and white matter microvascular changes

No acute intracranial hemorrhage

Anterior right frontal scalp hematoma.

Intact facial bones.  Negative for fracture.

Advanced multilevel cervical degenerative changes spondylosis as
well as facet arthropathy as detailed above.

No acute osseous finding or malalignment by CT.

## 2020-10-24 IMAGING — CT CT MAXILLOFACIAL W/O CM
3 of 5 series · 14 of 47 positions shown, 17 images · non-contrast
Comparison: [DATE], [DATE]

CLINICAL DATA: Fall, head and neck injury, laceration to the
forehead, dementia

EXAM:
CT HEAD WITHOUT CONTRAST
CT MAXILLOFACIAL WITHOUT CONTRAST
CT CERVICAL SPINE WITHOUT CONTRAST
TECHNIQUE: Multidetector CT imaging of the head, cervical spine, and
maxillofacial structures were performed using the standard protocol
without intravenous contrast. Multiplanar CT image reconstructions
of the cervical spine and maxillofacial structures were also
generated.

[Series 2: max soft · axial · 0.35mm/px · z∈[-242,-76]mm · 9 of 97 slices shown, 12 images]
[im 7/97  brain]
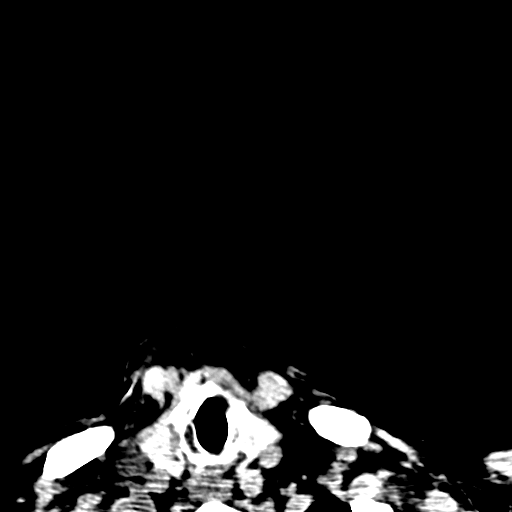
[im 7/97  bone]
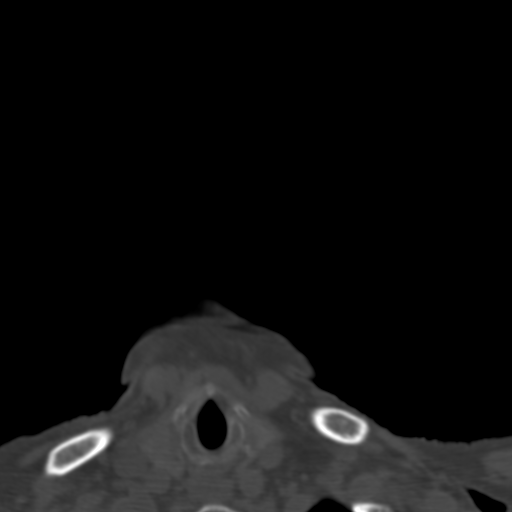
[im 17/97  bone]
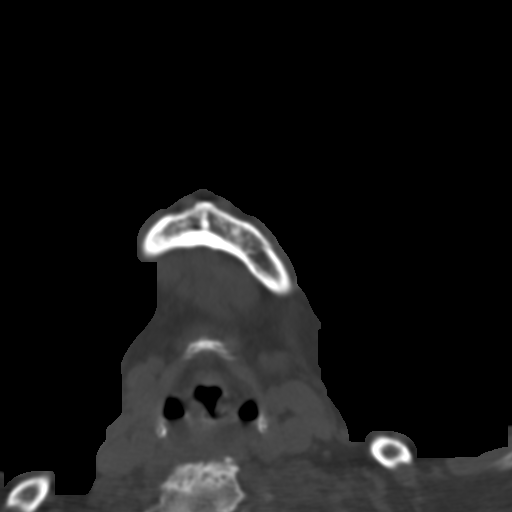
[im 27/97  bone]
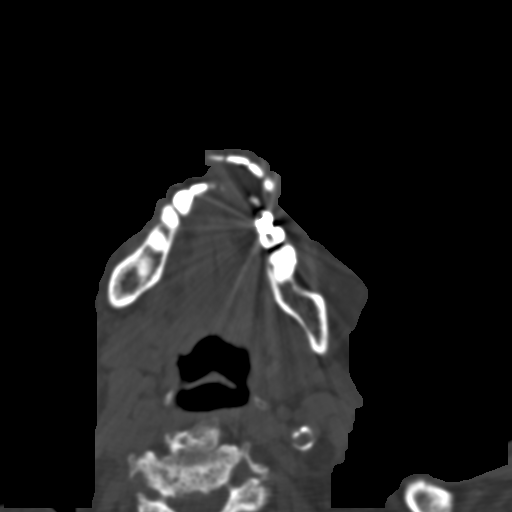
[im 37/97  bone]
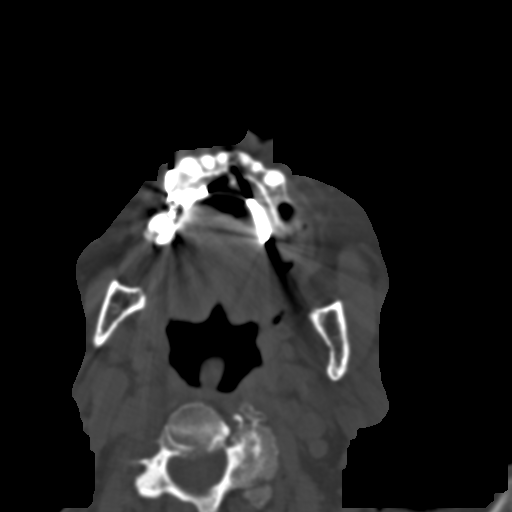
[im 50/97  brain]
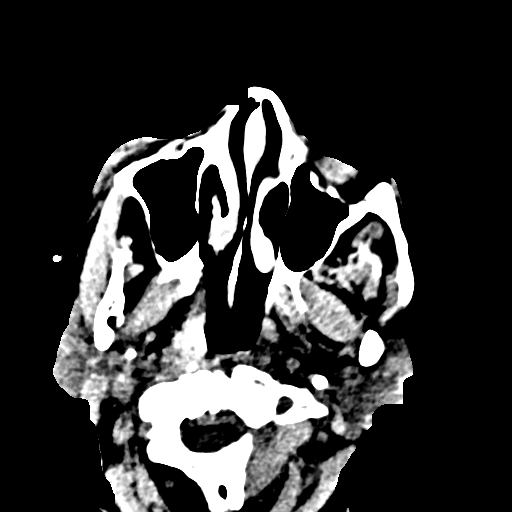
[im 50/97  bone]
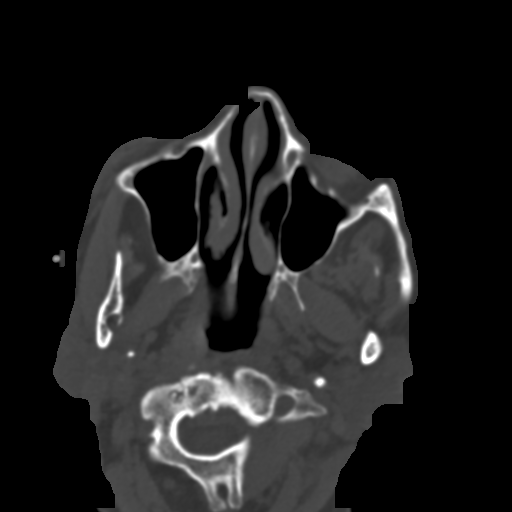
[im 60/97  bone]
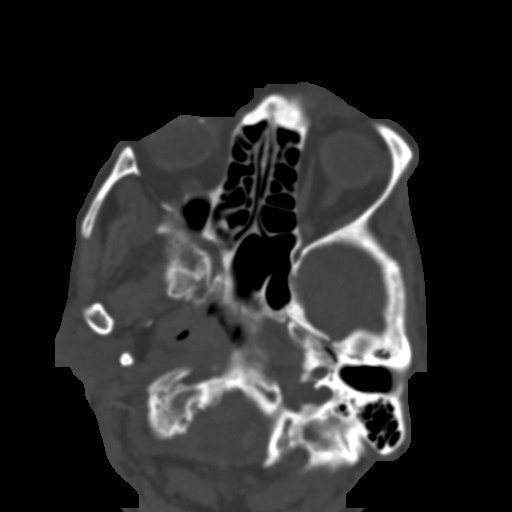
[im 70/97  bone]
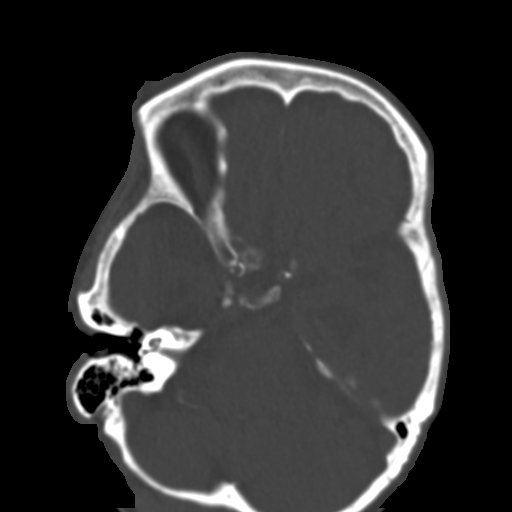
[im 80/97  bone]
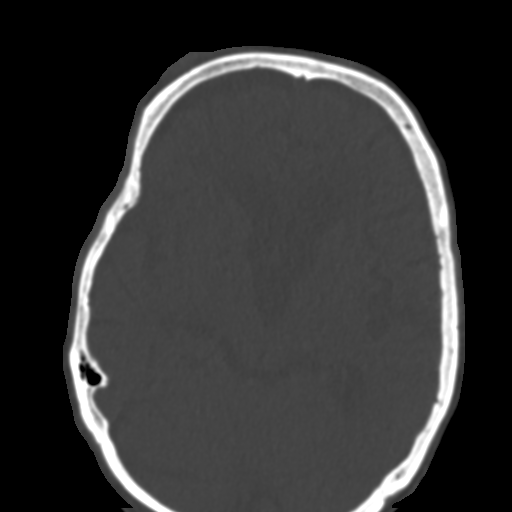
[im 90/97  brain]
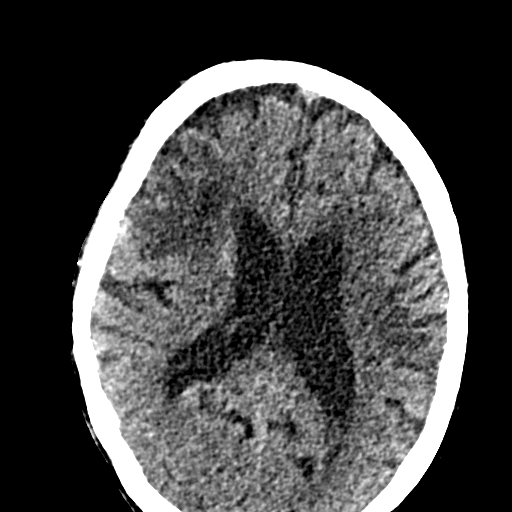
[im 90/97  bone]
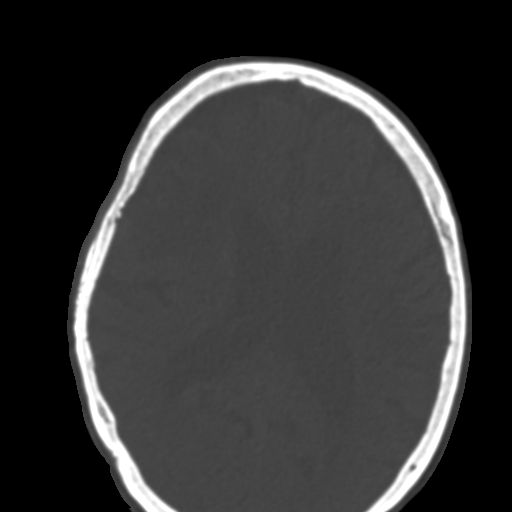

[Series 8: coronal bone · coronal · 0.36mm/px · 3 of 97 slices shown]
[im 25/97  bone]
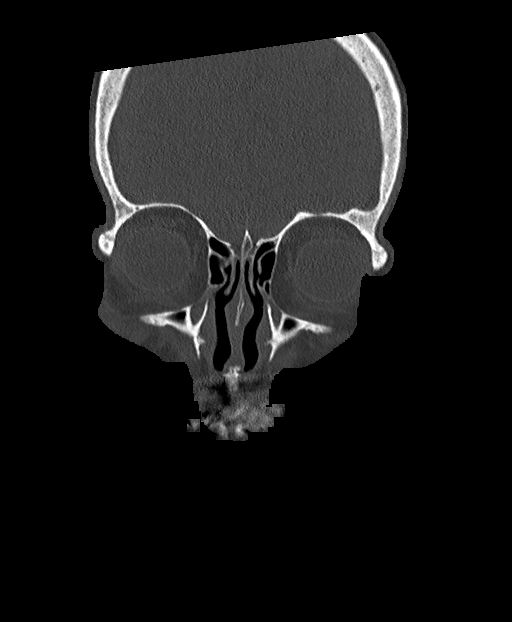
[im 49/97  bone]
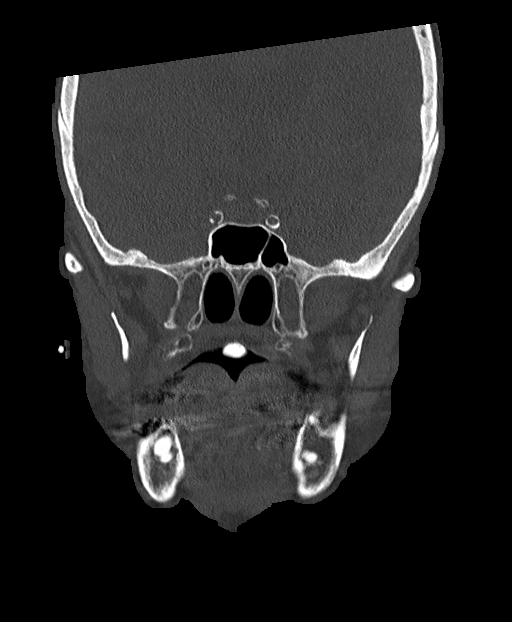
[im 73/97  bone]
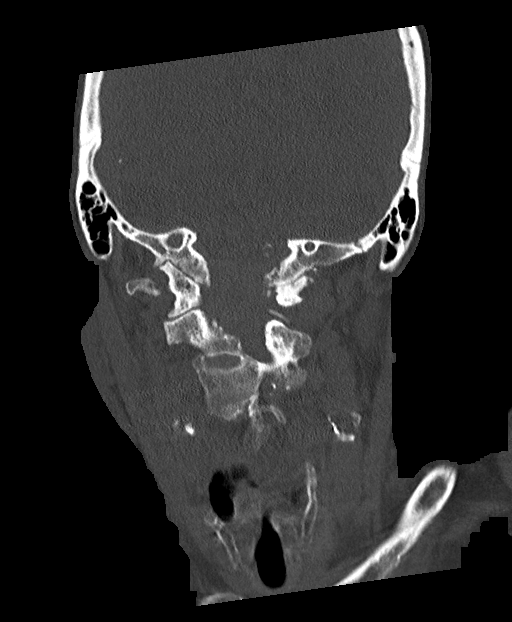

[Series 9: sagittal bone · sagittal · 0.45mm/px · 2 of 93 slices shown]
[im 24/93  bone]
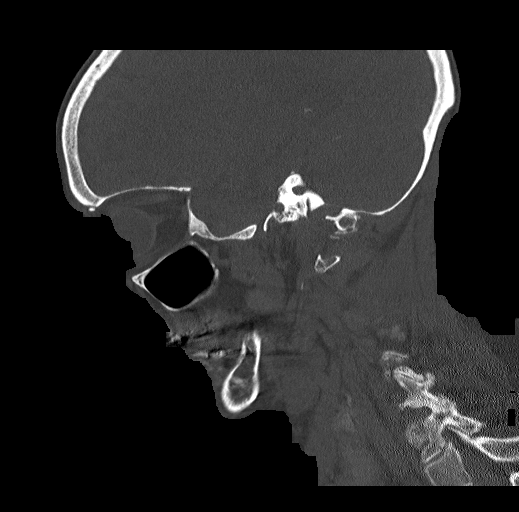
[im 48/93  bone]
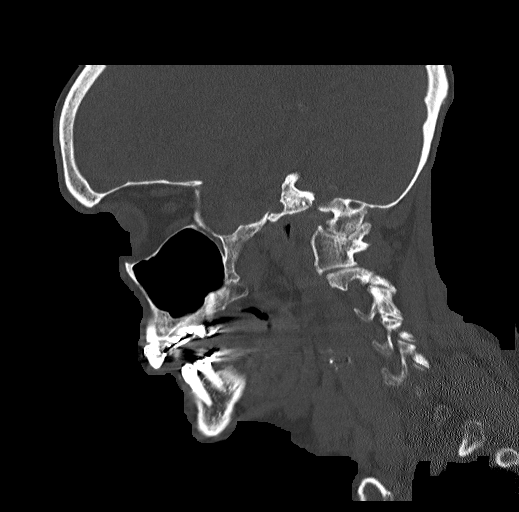

[14 of 47 positions shown; findings below may reference images not displayed]

FINDINGS: CT HEAD FINDINGS

Brain: Age-related atrophy and extensive white matter microvascular
ischemic changes throughout both cerebral hemispheres. Ill-defined
hypodensities in the right anterior frontal lobe and the left mid
frontal lobe compatible with known evolving bilateral MCA subacute
infarcts.

No acute intracranial hemorrhage. Stable ventricular enlargement. No
extra-axial fluid collection. No mass effect, edema, or herniation.
Cerebellar atrophy as well.

Vascular: Intracranial atherosclerosis noted.

Skull: Normal. Negative for fracture or focal lesion.

Other: Right anterior frontal scalp hematoma noted.

CT MAXILLOFACIAL FINDINGS

Osseous: No fracture or mandibular dislocation. No destructive
process.

Orbits: Negative. No traumatic or inflammatory finding.

Sinuses: Clear.

Soft tissues: Acute hyperdense right anterior frontal and superior
orbital scalp hematoma.

CT CERVICAL SPINE FINDINGS

Alignment: Mild dextrocurvature appears positional. No gross
malalignment.

Skull base and vertebrae: No acute fracture. No primary bone lesion
or focal pathologic process.

Soft tissues and spinal canal: No prevertebral fluid or swelling. No
visible canal hematoma.

Disc levels: Advanced multilevel cervical spondylosis and
degenerative change, most severe at C4 through C7. These levels
demonstrate marked disc space narrowing, sclerosis and endplate
osteophytes. Multilevel posterior facet arthropathy with ankylosis
of the facet joints at C2 through C5. Facet arthropathy is markedly
worse on the left. No acute subluxation or dislocation.

Upper chest: Minor apical scarring. Aortic and carotid
atherosclerosis.

Other: None.
IMPRESSION: Known evolving bilateral frontal subacute MCA territory infarcts.

Chronic atrophy and white matter microvascular changes

No acute intracranial hemorrhage

Anterior right frontal scalp hematoma.

Intact facial bones.  Negative for fracture.

Advanced multilevel cervical degenerative changes spondylosis as
well as facet arthropathy as detailed above.

No acute osseous finding or malalignment by CT.

## 2020-10-24 MED ORDER — LIDOCAINE-EPINEPHRINE-TETRACAINE (LET) TOPICAL GEL
3.0000 mL | Freq: Once | TOPICAL | Status: AC
  Administered 2020-10-24: 3 mL via TOPICAL
  Filled 2020-10-24: qty 3

## 2020-10-24 MED ORDER — ACETAMINOPHEN 325 MG PO TABS
650.0000 mg | ORAL_TABLET | Freq: Once | ORAL | Status: AC
  Administered 2020-10-24: 650 mg via ORAL
  Filled 2020-10-24: qty 2

## 2020-10-24 MED ORDER — BACITRACIN-NEOMYCIN-POLYMYXIN 400-5-5000 EX OINT
TOPICAL_OINTMENT | Freq: Once | CUTANEOUS | Status: AC
  Filled 2020-10-24: qty 1

## 2020-10-24 NOTE — ED Provider Notes (Signed)
[facility] Emergency Department Provider Note  ____________________________________________   Event Date/Time   First MD Initiated Contact with Patient 10/24/20 1014     (approximate)  I have reviewed the triage vital signs and the nursing notes.   HISTORY  Chief Complaint Fall  History provided by EMS.  Patient has dementia at baseline.  HPI Sheryl Suarez is a 87 y.o. female presents to the ED via EMS from [facility].  Patient with the below medical history including A. fib, CHF, stroke and hypertension, presents to the ED following mechanical fall out of her chair.  Patient apparently fell face forward out of her chair, presents to the ED with a laceration to the forehead and multiple skin tears.    Past Medical History:  Diagnosis Date   Acute respiratory failure with hypoxia (HCC)    Arrhythmia    atrial fibrillation   Breast cancer (HCC)    remission   CHF (congestive heart failure) ([place])    Coronary artery disease    Hypertension    Sepsis due to undetermined organism ([place]) 07/16/2020   Stroke [facility])     Patient Active Problem List   Diagnosis Date Noted   AMS (altered mental status) 10/01/2020   AKI (acute kidney injury) ([place]) 10/01/2020   Hyponatremia 10/01/2020   Chronic diastolic CHF (congestive heart failure) ([place]) 10/01/2020   Sacral pressure ulcer 10/01/2020   Falls 09/08/2020   History of CVA (cerebrovascular accident) 09/08/2020   Goals of care, counseling/discussion 07/16/2020   DNR (do not resuscitate) 07/16/2020   Pressure ulcer 07/12/2020   Acute ischemic stroke ([place]) 07/11/2020   CHF exacerbation ([place])    Frequent falls    Bradycardia    HLD (hyperlipidemia) 04/23/2019   Acute lower UTI 04/23/2019   Closed fracture of right olecranon process    Fall    Generalized weakness    Closed fracture dislocation of right elbow 01/15/2019   Closed rib fracture 01/15/2019   HTN (hypertension), benign 01/15/2019   CAD  (coronary artery disease) 01/15/2019   Atrial fibrillation, chronic ([place]) 01/15/2019   Rhabdomyolysis 01/15/2019   GI bleed 08/18/2015    Past Surgical History:  Procedure Laterality Date   ABDOMINAL HYSTERECTOMY     APPENDECTOMY     BREAST IMPLANT EXCHANGE     CHOLECYSTECTOMY     ESOPHAGOGASTRODUODENOSCOPY (EGD) WITH PROPOFOL N/A 08/20/2015   Procedure: ESOPHAGOGASTRODUODENOSCOPY (EGD) WITH PROPOFOL;  Surgeon: Lollie Sails, MD;  Location: [facility] ENDOSCOPY;  Service: Endoscopy;  Laterality: N/A;   MASTECTOMY Bilateral    ORIF ELBOW FRACTURE Right 01/16/2019   Procedure: OPEN REDUCTION INTERNAL FIXATION (ORIF) ELBOW/OLECRANON FRACTURE;  Surgeon: Earnestine Leys, MD;  Location: [facility] ORS;  Service: Orthopedics;  Laterality: Right;    Prior to Admission medications   Medication Sig Start Date End Date Taking? Authorizing Provider  acetaminophen (TYLENOL) 325 MG tablet Take 2 tablets (650 mg total) by mouth every 4 (four) hours as needed for mild pain (or temp > 37.5 C (99.5 F)). 07/23/20   Bailey-Modzik, Delila A, NP  aspirin EC 81 MG EC tablet Take 1 tablet (81 mg total) by mouth daily. Swallow whole. 07/24/20   Bailey-Modzik, Delila A, NP  atorvastatin (LIPITOR) 10 MG tablet Take 1 tablet (10 mg total) by mouth daily. 10/06/20   Lorella Nimrod, MD  diltiazem (CARDIZEM CD) 120 MG 24 hr capsule Take 120 mg by mouth daily. 08/21/20   [provider]  Emollient (CETAPHIL) cream Apply 1 application topically  daily. (Apply to bilateral legs)    [provider]  furosemide (LASIX) 20 MG tablet Take 10 mg by mouth daily. 08/30/20   [provider]  lidocaine (LIDODERM) 5 % Place 1 patch onto the skin daily. (Apply to lower back and remove after 12 hours)    [provider]  lisinopril (ZESTRIL) 5 MG tablet Take 5 mg by mouth daily. 08/16/20   [provider]  ondansetron (ZOFRAN-ODT) 4 MG disintegrating tablet Take 1 tablet (4 mg total) by mouth every 6 (six)  hours as needed for nausea. 07/23/20   Bailey-Modzik, Delila A, NP  polyvinyl alcohol (LIQUIFILM TEARS) 1.4 % ophthalmic solution Place 1 drop into both eyes 4 (four) times daily as needed for dry eyes. 07/23/20   Bailey-Modzik, Delila A, NP  senna-docusate (SENOKOT-S) 8.6-50 MG tablet Take 1 tablet by mouth at bedtime as needed for mild constipation. 07/23/20   Bailey-Modzik, Delila A, NP  zinc oxide 20 % ointment Apply 1 application topically 3 (three) times daily. (Apply to sacrum)    [provider]    Allergies Patient has no known allergies.  Family History  Problem Relation Age of Onset   CAD Mother    CAD Father     Social History Social History   Tobacco Use   Smoking status: Never   Smokeless tobacco: Never  Vaping Use   Vaping Use: Never used  Substance Use Topics   Alcohol use: No   Drug use: Never    Review of Systems  Constitutional: No fever/chills Eyes: No visual changes. ENT: No sore throat. Cardiovascular: Denies chest pain. Respiratory: Denies shortness of breath. Gastrointestinal: No abdominal pain.  No nausea, no vomiting.  No diarrhea.  No constipation. Genitourinary: Negative for dysuria. Musculoskeletal: Negative for back pain. Skin: Negative for rash.  Large skin tear/abrasion to the forehead, and smaller skin tear to the left elbow Neurological: Negative for headaches, focal weakness or numbness. ____________________________________________   PHYSICAL EXAM:  VITAL SIGNS: ED Triage Vitals [10/24/20 1015]  Enc Vitals Group     BP      Pulse      Resp      Temp      Temp src      SpO2      Weight 100 lb (45.4 kg)     Height '5\' 3"'$  (1.6 m)     Head Circumference      Peak Flow      Pain Score      Pain Loc      Pain Edu?      Excl. in [place]?     Constitutional: Alert and oriented. Well appearing and in no acute distress. Eyes: Conjunctivae are normal. PERRL. EOMI. Head: Atraumatic, except for a large hematoma this abrasion to the  right forehead.. Nose: No congestion/rhinnorhea. Mouth/Throat: Mucous membranes are moist.  Oropharynx non-erythematous. Neck: No stridor.  No cervical spine tenderness to palpation Cardiovascular: Normal rate, regular rhythm. Grossly normal heart sounds.  Good peripheral circulation. Respiratory: Normal respiratory effort.  No retractions. Lungs CTAB. Gastrointestinal: Soft and nontender. No distention. No abdominal bruits. No CVA tenderness. Musculoskeletal: No lower extremity tenderness nor edema.  No joint effusions.  Normal range of motion of the left elbow Neurologic:  Normal speech and language. No gross focal neurologic deficits are appreciated. No gait instability. Skin:  Skin is warm, dry and intact. No rash noted. Psychiatric: Mood and affect are normal. Speech and behavior are normal.  ____________________________________________  LABS (all labs ordered are listed, but only abnormal results are displayed)  Labs Reviewed - No data to display ____________________________________________  EKG   ____________________________________________  RADIOLOGY I, Melvenia Needles, personally viewed and evaluated these images (plain radiographs) as part of my medical decision making, as well as reviewing the written report by the radiologist.  ED MD interpretation:  agree with reports  Official radiology report(s): CT HEAD WO CONTRAST (5MM)  Result Date: 10/24/2020 CLINICAL DATA:  Fall, head and neck injury, laceration to the forehead, dementia EXAM: CT HEAD WITHOUT CONTRAST CT MAXILLOFACIAL WITHOUT CONTRAST CT CERVICAL SPINE WITHOUT CONTRAST TECHNIQUE: Multidetector CT imaging of the head, cervical spine, and maxillofacial structures were performed using the standard protocol without intravenous contrast. Multiplanar CT image reconstructions of the cervical spine and maxillofacial structures were also generated. COMPARISON:  10/01/2020, 10/02/2020 FINDINGS: CT HEAD FINDINGS  Brain: Age-related atrophy and extensive white matter microvascular ischemic changes throughout both cerebral hemispheres. Ill-defined hypodensities in the right anterior frontal lobe and the left mid frontal lobe compatible with known evolving bilateral MCA subacute infarcts. No acute intracranial hemorrhage. Stable ventricular enlargement. No extra-axial fluid collection. No mass effect, edema, or herniation. Cerebellar atrophy as well. Vascular: Intracranial atherosclerosis noted. Skull: Normal. Negative for fracture or focal lesion. Other: Right anterior frontal scalp hematoma noted. CT MAXILLOFACIAL FINDINGS Osseous: No fracture or mandibular dislocation. No destructive process. Orbits: Negative. No traumatic or inflammatory finding. Sinuses: Clear. Soft tissues: Acute hyperdense right anterior frontal and superior orbital scalp hematoma. CT CERVICAL SPINE FINDINGS Alignment: Mild dextrocurvature appears positional. No gross malalignment. Skull base and vertebrae: No acute fracture. No primary bone lesion or focal pathologic process. Soft tissues and spinal canal: No prevertebral fluid or swelling. No visible canal hematoma. Disc levels: Advanced multilevel cervical spondylosis and degenerative change, most severe at C4 through C7. These levels demonstrate marked disc space narrowing, sclerosis and endplate osteophytes. Multilevel posterior facet arthropathy with ankylosis of the facet joints at C2 through C5. Facet arthropathy is markedly worse on the left. No acute subluxation or dislocation. Upper chest: Minor apical scarring. Aortic and carotid atherosclerosis. Other: None. IMPRESSION: Known evolving bilateral frontal subacute MCA territory infarcts. Chronic atrophy and white matter microvascular changes No acute intracranial hemorrhage Anterior right frontal scalp hematoma. Intact facial bones.  Negative for fracture. Advanced multilevel cervical degenerative changes spondylosis as well as facet  arthropathy as detailed above. No acute osseous finding or malalignment by CT. Electronically Signed   By: Jerilynn Mages.  Shick M.D.   On: 10/24/2020 11:19   CT Cervical Spine Wo Contrast  Result Date: 10/24/2020 CLINICAL DATA:  Fall, head and neck injury, laceration to the forehead, dementia EXAM: CT HEAD WITHOUT CONTRAST CT MAXILLOFACIAL WITHOUT CONTRAST CT CERVICAL SPINE WITHOUT CONTRAST TECHNIQUE: Multidetector CT imaging of the head, cervical spine, and maxillofacial structures were performed using the standard protocol without intravenous contrast. Multiplanar CT image reconstructions of the cervical spine and maxillofacial structures were also generated. COMPARISON:  10/01/2020, 10/02/2020 FINDINGS: CT HEAD FINDINGS Brain: Age-related atrophy and extensive white matter microvascular ischemic changes throughout both cerebral hemispheres. Ill-defined hypodensities in the right anterior frontal lobe and the left mid frontal lobe compatible with known evolving bilateral MCA subacute infarcts. No acute intracranial hemorrhage. Stable ventricular enlargement. No extra-axial fluid collection. No mass effect, edema, or herniation. Cerebellar atrophy as well. Vascular: Intracranial atherosclerosis noted. Skull: Normal. Negative for fracture or focal lesion. Other: Right anterior frontal scalp hematoma noted. CT MAXILLOFACIAL FINDINGS Osseous: No fracture or mandibular dislocation. No destructive  process. Orbits: Negative. No traumatic or inflammatory finding. Sinuses: Clear. Soft tissues: Acute hyperdense right anterior frontal and superior orbital scalp hematoma. CT CERVICAL SPINE FINDINGS Alignment: Mild dextrocurvature appears positional. No gross malalignment. Skull base and vertebrae: No acute fracture. No primary bone lesion or focal pathologic process. Soft tissues and spinal canal: No prevertebral fluid or swelling. No visible canal hematoma. Disc levels: Advanced multilevel cervical spondylosis and degenerative  change, most severe at C4 through C7. These levels demonstrate marked disc space narrowing, sclerosis and endplate osteophytes. Multilevel posterior facet arthropathy with ankylosis of the facet joints at C2 through C5. Facet arthropathy is markedly worse on the left. No acute subluxation or dislocation. Upper chest: Minor apical scarring. Aortic and carotid atherosclerosis. Other: None. IMPRESSION: Known evolving bilateral frontal subacute MCA territory infarcts. Chronic atrophy and white matter microvascular changes No acute intracranial hemorrhage Anterior right frontal scalp hematoma. Intact facial bones.  Negative for fracture. Advanced multilevel cervical degenerative changes spondylosis as well as facet arthropathy as detailed above. No acute osseous finding or malalignment by CT. Electronically Signed   By: Jerilynn Mages.  Shick M.D.   On: 10/24/2020 11:19   CT Maxillofacial Wo Contrast  Result Date: 10/24/2020 CLINICAL DATA:  Fall, head and neck injury, laceration to the forehead, dementia EXAM: CT HEAD WITHOUT CONTRAST CT MAXILLOFACIAL WITHOUT CONTRAST CT CERVICAL SPINE WITHOUT CONTRAST TECHNIQUE: Multidetector CT imaging of the head, cervical spine, and maxillofacial structures were performed using the standard protocol without intravenous contrast. Multiplanar CT image reconstructions of the cervical spine and maxillofacial structures were also generated. COMPARISON:  10/01/2020, 10/02/2020 FINDINGS: CT HEAD FINDINGS Brain: Age-related atrophy and extensive white matter microvascular ischemic changes throughout both cerebral hemispheres. Ill-defined hypodensities in the right anterior frontal lobe and the left mid frontal lobe compatible with known evolving bilateral MCA subacute infarcts. No acute intracranial hemorrhage. Stable ventricular enlargement. No extra-axial fluid collection. No mass effect, edema, or herniation. Cerebellar atrophy as well. Vascular: Intracranial atherosclerosis noted. Skull: Normal.  Negative for fracture or focal lesion. Other: Right anterior frontal scalp hematoma noted. CT MAXILLOFACIAL FINDINGS Osseous: No fracture or mandibular dislocation. No destructive process. Orbits: Negative. No traumatic or inflammatory finding. Sinuses: Clear. Soft tissues: Acute hyperdense right anterior frontal and superior orbital scalp hematoma. CT CERVICAL SPINE FINDINGS Alignment: Mild dextrocurvature appears positional. No gross malalignment. Skull base and vertebrae: No acute fracture. No primary bone lesion or focal pathologic process. Soft tissues and spinal canal: No prevertebral fluid or swelling. No visible canal hematoma. Disc levels: Advanced multilevel cervical spondylosis and degenerative change, most severe at C4 through C7. These levels demonstrate marked disc space narrowing, sclerosis and endplate osteophytes. Multilevel posterior facet arthropathy with ankylosis of the facet joints at C2 through C5. Facet arthropathy is markedly worse on the left. No acute subluxation or dislocation. Upper chest: Minor apical scarring. Aortic and carotid atherosclerosis. Other: None. IMPRESSION: Known evolving bilateral frontal subacute MCA territory infarcts. Chronic atrophy and white matter microvascular changes No acute intracranial hemorrhage Anterior right frontal scalp hematoma. Intact facial bones.  Negative for fracture. Advanced multilevel cervical degenerative changes spondylosis as well as facet arthropathy as detailed above. No acute osseous finding or malalignment by CT. Electronically Signed   By: Jerilynn Mages.  Shick M.D.   On: 10/24/2020 11:19    ____________________________________________   PROCEDURES  Procedure(s) performed (including Critical Care):  Procedures  Wound care Non-stick Telfa Neosporin Vaseline soaked gauze ____________________________________________   INITIAL IMPRESSION / ASSESSMENT AND PLAN / ED COURSE  As part of my  medical decision making, I reviewed the following  data within the [place] History obtained from family, Radiograph reviewed NAD, and Notes from prior ED visits      Geriatric patient ED evaluation of mechanical fall from her chair at her nursing facility.  Her adult daughter is at bedside for interim history.  She reports the patient has maintain baseline level of cognition and activity.  She is evaluated for complaints, and found to have reassuring imaging of the head, face, and neck.  Skin abrasions are cleansed and dressed with nonstick dressing and ointment.  Patient discharged to the care of her facility.  She is transported via EMS back to [facility].   ____________________________________________   FINAL CLINICAL IMPRESSION(S) / ED DIAGNOSES  Final diagnoses:  Fall in home, initial encounter  Contusion of face, initial encounter  Hematoma     ED Discharge Orders     None        Note:  This document was prepared using Dragon voice recognition software and may include unintentional dictation errors.    Melvenia Needles, PA-C 10/24/20 1558    Vladimir Crofts, MD 10/25/20 1043

## 2020-10-24 NOTE — ED Notes (Signed)
Pt wounds cleaned with NS and soaked gauze left on head. Pt demented at baseline, yelling random letters and phrases but cooperative.

## 2020-10-24 NOTE — ED Notes (Signed)
Pt taken to CT.

## 2020-10-24 NOTE — ED Notes (Signed)
Daughter at bedside.

## 2020-10-24 NOTE — ED Notes (Signed)
Pt remains in bed. Locked in lowest position. Waiting on EMS for transport.

## 2020-10-24 NOTE — ED Triage Notes (Signed)
Pt comes ems from [facility] for falling face forward out of chair. Dementia at baseline. Has lac to forehead. Vitals stable with EMS.

## 2020-10-24 NOTE — Discharge Instructions (Addendum)
Sheryl Suarez has a normal exam including CTs of the head, face, and neck.  Her wounds were cleansed and dressed with nonstick dressings. Change the dressings daily. Keep the wounds clean, dry, and covered.  Follow-up with primary provider for ongoing symptoms.

## 2020-10-27 ENCOUNTER — Other Ambulatory Visit: Payer: Self-pay

## 2020-10-27 NOTE — Patient Outreach (Signed)
[facility]) Care Management  10/27/2020  Sheryl Suarez 01/28/1924 [id number]   Telephone outreach to patient to obtain mRS was successfully completed. MRS= 3  Thank you, [place] Care Management Assistant

## 2020-11-30 LAB — BLOOD GAS, VENOUS
Acid-Base Excess: 7.8 mmol/L — ABNORMAL HIGH (ref 0.0–2.0)
Bicarbonate: 33.3 mmol/L — ABNORMAL HIGH (ref 20.0–28.0)
O2 Saturation: 48.4 %
Patient temperature: 37
pCO2, Ven: 49 mmHg (ref 44.0–60.0)
pH, Ven: 7.44 — ABNORMAL HIGH (ref 7.250–7.430)

## 2021-02-21 DEATH — deceased
